# Patient Record
Sex: Female | Born: 1935 | Race: Black or African American | Hispanic: No | Smoking: Former smoker
Health system: Southern US, Community
[De-identification: ages and names within clinical notes are randomized; demographics above are authoritative.]

## PROBLEM LIST (undated history)

## (undated) DIAGNOSIS — M797 Fibromyalgia: Secondary | ICD-10-CM

## (undated) DIAGNOSIS — N182 Chronic kidney disease, stage 2 (mild): Secondary | ICD-10-CM

## (undated) DIAGNOSIS — I639 Cerebral infarction, unspecified: Secondary | ICD-10-CM

## (undated) DIAGNOSIS — I10 Essential (primary) hypertension: Secondary | ICD-10-CM

## (undated) DIAGNOSIS — R569 Unspecified convulsions: Secondary | ICD-10-CM

## (undated) DIAGNOSIS — G709 Myoneural disorder, unspecified: Secondary | ICD-10-CM

## (undated) DIAGNOSIS — M1990 Unspecified osteoarthritis, unspecified site: Secondary | ICD-10-CM

## (undated) DIAGNOSIS — F17200 Nicotine dependence, unspecified, uncomplicated: Secondary | ICD-10-CM

## (undated) DIAGNOSIS — R4182 Altered mental status, unspecified: Secondary | ICD-10-CM

## (undated) DIAGNOSIS — I2510 Atherosclerotic heart disease of native coronary artery without angina pectoris: Secondary | ICD-10-CM

## (undated) DIAGNOSIS — Z8719 Personal history of other diseases of the digestive system: Secondary | ICD-10-CM

## (undated) DIAGNOSIS — I6529 Occlusion and stenosis of unspecified carotid artery: Secondary | ICD-10-CM

## (undated) DIAGNOSIS — R55 Syncope and collapse: Secondary | ICD-10-CM

## (undated) DIAGNOSIS — R51 Headache: Secondary | ICD-10-CM

## (undated) DIAGNOSIS — R002 Palpitations: Secondary | ICD-10-CM

## (undated) DIAGNOSIS — E0590 Thyrotoxicosis, unspecified without thyrotoxic crisis or storm: Secondary | ICD-10-CM

## (undated) DIAGNOSIS — D649 Anemia, unspecified: Secondary | ICD-10-CM

## (undated) DIAGNOSIS — E119 Type 2 diabetes mellitus without complications: Secondary | ICD-10-CM

## (undated) DIAGNOSIS — J069 Acute upper respiratory infection, unspecified: Secondary | ICD-10-CM

## (undated) DIAGNOSIS — F419 Anxiety disorder, unspecified: Secondary | ICD-10-CM

## (undated) DIAGNOSIS — R011 Cardiac murmur, unspecified: Secondary | ICD-10-CM

## (undated) DIAGNOSIS — Z609 Problem related to social environment, unspecified: Secondary | ICD-10-CM

## (undated) DIAGNOSIS — E785 Hyperlipidemia, unspecified: Secondary | ICD-10-CM

## (undated) DIAGNOSIS — G252 Other specified forms of tremor: Secondary | ICD-10-CM

## (undated) DIAGNOSIS — I5032 Chronic diastolic (congestive) heart failure: Secondary | ICD-10-CM

## (undated) DIAGNOSIS — J449 Chronic obstructive pulmonary disease, unspecified: Secondary | ICD-10-CM

## (undated) DIAGNOSIS — E559 Vitamin D deficiency, unspecified: Secondary | ICD-10-CM

## (undated) DIAGNOSIS — J4489 Other specified chronic obstructive pulmonary disease: Secondary | ICD-10-CM

## (undated) HISTORY — DX: Other specified chronic obstructive pulmonary disease: J44.89

## (undated) HISTORY — DX: Chronic obstructive pulmonary disease, unspecified: J44.9

## (undated) HISTORY — DX: Altered mental status, unspecified: R41.82

## (undated) HISTORY — PX: KNEE SURGERY: SHX244

## (undated) HISTORY — DX: Type 2 diabetes mellitus without complications: E11.9

## (undated) HISTORY — DX: Cerebral infarction, unspecified: I63.9

## (undated) HISTORY — DX: Vitamin D deficiency, unspecified: E55.9

## (undated) HISTORY — DX: Other specified forms of tremor: G25.2

## (undated) HISTORY — DX: Anxiety disorder, unspecified: F41.9

## (undated) HISTORY — DX: Atherosclerotic heart disease of native coronary artery without angina pectoris: I25.10

## (undated) HISTORY — DX: Hyperlipidemia, unspecified: E78.5

## (undated) HISTORY — DX: Essential (primary) hypertension: I10

## (undated) HISTORY — DX: Syncope and collapse: R55

---

## 1963-12-24 HISTORY — PX: VAGINAL HYSTERECTOMY: SUR661

## 1963-12-24 HISTORY — PX: APPENDECTOMY: SHX54

## 1979-03-03 DIAGNOSIS — M1990 Unspecified osteoarthritis, unspecified site: Secondary | ICD-10-CM | POA: Insufficient documentation

## 1994-06-22 ENCOUNTER — Encounter (INDEPENDENT_AMBULATORY_CARE_PROVIDER_SITE_OTHER): Payer: Self-pay | Admitting: *Deleted

## 1994-06-22 LAB — CONVERTED CEMR LAB

## 1998-04-11 ENCOUNTER — Encounter: Admission: RE | Admit: 1998-04-11 | Discharge: 1998-04-11 | Payer: Self-pay | Admitting: Family Medicine

## 1998-04-21 ENCOUNTER — Encounter: Admission: RE | Admit: 1998-04-21 | Discharge: 1998-04-21 | Payer: Self-pay | Admitting: Sports Medicine

## 1998-04-25 ENCOUNTER — Encounter: Admission: RE | Admit: 1998-04-25 | Discharge: 1998-04-25 | Payer: Self-pay | Admitting: Family Medicine

## 1998-04-27 ENCOUNTER — Encounter: Admission: RE | Admit: 1998-04-27 | Discharge: 1998-04-27 | Payer: Self-pay | Admitting: Family Medicine

## 1998-08-17 ENCOUNTER — Ambulatory Visit (HOSPITAL_COMMUNITY): Admission: RE | Admit: 1998-08-17 | Discharge: 1998-08-17 | Payer: Self-pay | Admitting: Ophthalmology

## 1998-11-23 ENCOUNTER — Encounter: Admission: RE | Admit: 1998-11-23 | Discharge: 1998-11-23 | Payer: Self-pay | Admitting: Family Medicine

## 1999-07-13 ENCOUNTER — Encounter: Admission: RE | Admit: 1999-07-13 | Discharge: 1999-07-13 | Payer: Self-pay | Admitting: Family Medicine

## 1999-10-17 ENCOUNTER — Encounter: Admission: RE | Admit: 1999-10-17 | Discharge: 1999-10-17 | Payer: Self-pay | Admitting: Family Medicine

## 1999-12-31 ENCOUNTER — Encounter: Admission: RE | Admit: 1999-12-31 | Discharge: 1999-12-31 | Payer: Self-pay | Admitting: Family Medicine

## 2000-01-21 ENCOUNTER — Encounter: Admission: RE | Admit: 2000-01-21 | Discharge: 2000-01-21 | Payer: Self-pay | Admitting: Sports Medicine

## 2000-01-21 ENCOUNTER — Encounter: Payer: Self-pay | Admitting: Sports Medicine

## 2000-01-21 ENCOUNTER — Encounter: Admission: RE | Admit: 2000-01-21 | Discharge: 2000-01-21 | Payer: Self-pay | Admitting: Family Medicine

## 2000-01-28 ENCOUNTER — Encounter: Admission: RE | Admit: 2000-01-28 | Discharge: 2000-01-28 | Payer: Self-pay | Admitting: Sports Medicine

## 2000-01-28 ENCOUNTER — Encounter: Payer: Self-pay | Admitting: Sports Medicine

## 2000-05-07 ENCOUNTER — Encounter: Admission: RE | Admit: 2000-05-07 | Discharge: 2000-05-07 | Payer: Self-pay | Admitting: Family Medicine

## 2000-09-04 ENCOUNTER — Encounter (INDEPENDENT_AMBULATORY_CARE_PROVIDER_SITE_OTHER): Payer: Self-pay | Admitting: Specialist

## 2000-09-04 ENCOUNTER — Ambulatory Visit (HOSPITAL_COMMUNITY): Admission: RE | Admit: 2000-09-04 | Discharge: 2000-09-04 | Payer: Self-pay | Admitting: Gastroenterology

## 2001-01-09 ENCOUNTER — Encounter: Admission: RE | Admit: 2001-01-09 | Discharge: 2001-01-09 | Payer: Self-pay | Admitting: Family Medicine

## 2001-01-15 ENCOUNTER — Encounter: Admission: RE | Admit: 2001-01-15 | Discharge: 2001-01-15 | Payer: Self-pay | Admitting: Family Medicine

## 2001-01-22 ENCOUNTER — Encounter: Admission: RE | Admit: 2001-01-22 | Discharge: 2001-01-22 | Payer: Self-pay | Admitting: Family Medicine

## 2001-01-29 ENCOUNTER — Encounter: Admission: RE | Admit: 2001-01-29 | Discharge: 2001-01-29 | Payer: Self-pay | Admitting: *Deleted

## 2001-02-18 ENCOUNTER — Encounter: Admission: RE | Admit: 2001-02-18 | Discharge: 2001-02-18 | Payer: Self-pay | Admitting: Family Medicine

## 2001-03-19 ENCOUNTER — Encounter: Admission: RE | Admit: 2001-03-19 | Discharge: 2001-03-19 | Payer: Self-pay | Admitting: *Deleted

## 2001-09-24 ENCOUNTER — Encounter: Admission: RE | Admit: 2001-09-24 | Discharge: 2001-09-24 | Payer: Self-pay | Admitting: Family Medicine

## 2001-09-28 ENCOUNTER — Ambulatory Visit (HOSPITAL_COMMUNITY): Admission: RE | Admit: 2001-09-28 | Discharge: 2001-09-28 | Payer: Self-pay | Admitting: Family Medicine

## 2001-09-28 ENCOUNTER — Encounter: Payer: Self-pay | Admitting: Family Medicine

## 2001-09-28 ENCOUNTER — Encounter: Admission: RE | Admit: 2001-09-28 | Discharge: 2001-09-28 | Payer: Self-pay | Admitting: Family Medicine

## 2001-10-01 ENCOUNTER — Encounter: Admission: RE | Admit: 2001-10-01 | Discharge: 2001-10-01 | Payer: Self-pay | Admitting: Family Medicine

## 2001-10-15 ENCOUNTER — Encounter: Admission: RE | Admit: 2001-10-15 | Discharge: 2001-10-15 | Payer: Self-pay | Admitting: Family Medicine

## 2001-11-30 ENCOUNTER — Encounter: Admission: RE | Admit: 2001-11-30 | Discharge: 2001-11-30 | Payer: Self-pay | Admitting: Sports Medicine

## 2001-12-04 ENCOUNTER — Encounter: Admission: RE | Admit: 2001-12-04 | Discharge: 2001-12-04 | Payer: Self-pay | Admitting: Family Medicine

## 2001-12-31 ENCOUNTER — Encounter: Admission: RE | Admit: 2001-12-31 | Discharge: 2001-12-31 | Payer: Self-pay | Admitting: Family Medicine

## 2002-01-05 ENCOUNTER — Encounter: Admission: RE | Admit: 2002-01-05 | Discharge: 2002-01-05 | Payer: Self-pay | Admitting: Family Medicine

## 2002-01-05 ENCOUNTER — Encounter: Payer: Self-pay | Admitting: Sports Medicine

## 2002-01-05 ENCOUNTER — Encounter: Admission: RE | Admit: 2002-01-05 | Discharge: 2002-01-05 | Payer: Self-pay | Admitting: Sports Medicine

## 2002-01-19 ENCOUNTER — Encounter: Admission: RE | Admit: 2002-01-19 | Discharge: 2002-01-19 | Payer: Self-pay | Admitting: Family Medicine

## 2002-02-03 ENCOUNTER — Encounter: Admission: RE | Admit: 2002-02-03 | Discharge: 2002-02-03 | Payer: Self-pay | Admitting: Sports Medicine

## 2002-05-04 ENCOUNTER — Encounter: Admission: RE | Admit: 2002-05-04 | Discharge: 2002-05-04 | Payer: Self-pay | Admitting: Family Medicine

## 2002-05-26 ENCOUNTER — Encounter: Admission: RE | Admit: 2002-05-26 | Discharge: 2002-05-26 | Payer: Self-pay | Admitting: Family Medicine

## 2003-02-15 ENCOUNTER — Encounter: Admission: RE | Admit: 2003-02-15 | Discharge: 2003-02-15 | Payer: Self-pay | Admitting: Sports Medicine

## 2004-02-14 ENCOUNTER — Encounter: Admission: RE | Admit: 2004-02-14 | Discharge: 2004-02-14 | Payer: Self-pay | Admitting: Family Medicine

## 2004-03-01 ENCOUNTER — Encounter: Admission: RE | Admit: 2004-03-01 | Discharge: 2004-03-01 | Payer: Self-pay | Admitting: Sports Medicine

## 2004-05-23 ENCOUNTER — Encounter: Admission: RE | Admit: 2004-05-23 | Discharge: 2004-05-23 | Payer: Self-pay | Admitting: Family Medicine

## 2004-06-06 ENCOUNTER — Encounter: Admission: RE | Admit: 2004-06-06 | Discharge: 2004-06-06 | Payer: Self-pay | Admitting: Sports Medicine

## 2004-08-06 ENCOUNTER — Encounter: Admission: RE | Admit: 2004-08-06 | Discharge: 2004-08-06 | Payer: Self-pay | Admitting: Family Medicine

## 2004-08-28 ENCOUNTER — Ambulatory Visit: Payer: Self-pay | Admitting: Family Medicine

## 2004-09-12 ENCOUNTER — Ambulatory Visit: Payer: Self-pay | Admitting: Family Medicine

## 2004-09-22 ENCOUNTER — Emergency Department (HOSPITAL_COMMUNITY): Admission: EM | Admit: 2004-09-22 | Discharge: 2004-09-22 | Payer: Self-pay | Admitting: Emergency Medicine

## 2004-10-30 ENCOUNTER — Ambulatory Visit: Payer: Self-pay | Admitting: Family Medicine

## 2004-12-26 ENCOUNTER — Ambulatory Visit: Payer: Self-pay | Admitting: Family Medicine

## 2005-01-07 ENCOUNTER — Ambulatory Visit (HOSPITAL_COMMUNITY): Admission: RE | Admit: 2005-01-07 | Discharge: 2005-01-07 | Payer: Self-pay | Admitting: Family Medicine

## 2005-01-07 ENCOUNTER — Ambulatory Visit: Payer: Self-pay | Admitting: Family Medicine

## 2005-01-07 ENCOUNTER — Encounter: Admission: RE | Admit: 2005-01-07 | Discharge: 2005-01-07 | Payer: Self-pay | Admitting: Sports Medicine

## 2005-01-29 ENCOUNTER — Ambulatory Visit: Payer: Self-pay | Admitting: Family Medicine

## 2005-03-01 ENCOUNTER — Ambulatory Visit: Payer: Self-pay | Admitting: Family Medicine

## 2005-04-08 ENCOUNTER — Ambulatory Visit: Payer: Self-pay | Admitting: Family Medicine

## 2005-04-23 ENCOUNTER — Encounter: Admission: RE | Admit: 2005-04-23 | Discharge: 2005-04-23 | Payer: Self-pay | Admitting: Sports Medicine

## 2005-09-10 ENCOUNTER — Encounter: Admission: RE | Admit: 2005-09-10 | Discharge: 2005-09-10 | Payer: Self-pay | Admitting: Sports Medicine

## 2005-09-10 ENCOUNTER — Ambulatory Visit: Payer: Self-pay | Admitting: Sports Medicine

## 2005-09-19 ENCOUNTER — Ambulatory Visit: Payer: Self-pay | Admitting: Family Medicine

## 2005-09-24 ENCOUNTER — Encounter: Admission: RE | Admit: 2005-09-24 | Discharge: 2005-09-24 | Payer: Self-pay | Admitting: Sports Medicine

## 2005-12-11 ENCOUNTER — Ambulatory Visit: Payer: Self-pay | Admitting: Family Medicine

## 2006-01-27 ENCOUNTER — Ambulatory Visit: Payer: Self-pay | Admitting: Sports Medicine

## 2006-01-27 ENCOUNTER — Ambulatory Visit (HOSPITAL_COMMUNITY): Admission: RE | Admit: 2006-01-27 | Discharge: 2006-01-27 | Payer: Self-pay | Admitting: Sports Medicine

## 2006-01-29 ENCOUNTER — Encounter: Admission: RE | Admit: 2006-01-29 | Discharge: 2006-01-29 | Payer: Self-pay | Admitting: Sports Medicine

## 2006-02-04 ENCOUNTER — Encounter (INDEPENDENT_AMBULATORY_CARE_PROVIDER_SITE_OTHER): Payer: Self-pay | Admitting: Cardiology

## 2006-02-04 ENCOUNTER — Ambulatory Visit (HOSPITAL_COMMUNITY): Admission: RE | Admit: 2006-02-04 | Discharge: 2006-02-04 | Payer: Self-pay | Admitting: Sports Medicine

## 2006-02-14 ENCOUNTER — Ambulatory Visit: Payer: Self-pay | Admitting: Family Medicine

## 2006-02-21 ENCOUNTER — Ambulatory Visit: Payer: Self-pay | Admitting: Family Medicine

## 2006-03-21 ENCOUNTER — Ambulatory Visit: Payer: Self-pay | Admitting: Family Medicine

## 2006-10-29 ENCOUNTER — Ambulatory Visit: Payer: Self-pay | Admitting: Family Medicine

## 2006-11-19 ENCOUNTER — Ambulatory Visit: Payer: Self-pay | Admitting: Family Medicine

## 2007-01-29 ENCOUNTER — Encounter: Admission: RE | Admit: 2007-01-29 | Discharge: 2007-01-29 | Payer: Self-pay | Admitting: Sports Medicine

## 2007-01-29 ENCOUNTER — Ambulatory Visit: Payer: Self-pay

## 2007-02-10 ENCOUNTER — Encounter (INDEPENDENT_AMBULATORY_CARE_PROVIDER_SITE_OTHER): Payer: Self-pay | Admitting: Family Medicine

## 2007-02-10 ENCOUNTER — Ambulatory Visit: Payer: Self-pay | Admitting: Family Medicine

## 2007-02-10 LAB — CONVERTED CEMR LAB
TSH: 1.006 microintl units/mL (ref 0.350–5.50)
Vit D, 1,25-Dihydroxy: 9 — ABNORMAL LOW (ref 20–57)

## 2007-02-20 ENCOUNTER — Encounter (INDEPENDENT_AMBULATORY_CARE_PROVIDER_SITE_OTHER): Payer: Self-pay | Admitting: *Deleted

## 2007-03-03 ENCOUNTER — Encounter (INDEPENDENT_AMBULATORY_CARE_PROVIDER_SITE_OTHER): Payer: Self-pay | Admitting: Family Medicine

## 2007-03-03 ENCOUNTER — Ambulatory Visit: Payer: Self-pay | Admitting: Family Medicine

## 2007-03-03 DIAGNOSIS — G252 Other specified forms of tremor: Secondary | ICD-10-CM

## 2007-03-03 DIAGNOSIS — M12819 Other specific arthropathies, not elsewhere classified, unspecified shoulder: Secondary | ICD-10-CM | POA: Insufficient documentation

## 2007-03-03 DIAGNOSIS — E785 Hyperlipidemia, unspecified: Secondary | ICD-10-CM

## 2007-03-03 HISTORY — DX: Other specified forms of tremor: G25.2

## 2007-03-03 LAB — CONVERTED CEMR LAB
ALT: 15 units/L (ref 0–35)
AST: 17 units/L (ref 0–37)
Albumin: 4.3 g/dL (ref 3.5–5.2)
Alkaline Phosphatase: 56 units/L (ref 39–117)
BUN: 21 mg/dL (ref 6–23)
CO2: 20 meq/L (ref 19–32)
Calcium: 9.8 mg/dL (ref 8.4–10.5)
Chloride: 111 meq/L (ref 96–112)
Cholesterol: 242 mg/dL — ABNORMAL HIGH (ref 0–200)
Creatinine, Ser: 1.05 mg/dL (ref 0.40–1.20)
Glucose, Bld: 97 mg/dL (ref 70–99)
HDL: 58 mg/dL (ref >39)
LDL Cholesterol: 167 mg/dL — ABNORMAL HIGH (ref 0–99)
Potassium: 4.3 meq/L (ref 3.5–5.3)
Rhuematoid fact SerPl-aCnc: <20 intl units/mL (ref 0–20)
Sodium: 143 meq/L (ref 135–145)
Total Bilirubin: 0.3 mg/dL (ref 0.3–1.2)
Total CHOL/HDL Ratio: 4.2
Total Protein: 7.3 g/dL (ref 6.0–8.3)
Triglycerides: 84 mg/dL (ref <150)
VLDL: 17 mg/dL (ref 0–40)

## 2007-05-27 ENCOUNTER — Telehealth: Payer: Self-pay | Admitting: *Deleted

## 2007-07-16 ENCOUNTER — Ambulatory Visit: Payer: Self-pay | Admitting: Family Medicine

## 2007-07-16 DIAGNOSIS — I6529 Occlusion and stenosis of unspecified carotid artery: Secondary | ICD-10-CM

## 2007-07-16 DIAGNOSIS — I6789 Other cerebrovascular disease: Secondary | ICD-10-CM

## 2007-07-16 DIAGNOSIS — E559 Vitamin D deficiency, unspecified: Secondary | ICD-10-CM

## 2007-07-16 DIAGNOSIS — R7303 Prediabetes: Secondary | ICD-10-CM

## 2007-07-16 DIAGNOSIS — Z8672 Personal history of thrombophlebitis: Secondary | ICD-10-CM

## 2007-07-16 DIAGNOSIS — N3941 Urge incontinence: Secondary | ICD-10-CM

## 2007-07-16 HISTORY — DX: Vitamin D deficiency, unspecified: E55.9

## 2007-07-16 HISTORY — DX: Occlusion and stenosis of unspecified carotid artery: I65.29

## 2007-08-07 ENCOUNTER — Ambulatory Visit: Payer: Self-pay | Admitting: Family Medicine

## 2007-08-07 DIAGNOSIS — I10 Essential (primary) hypertension: Secondary | ICD-10-CM

## 2007-09-17 ENCOUNTER — Encounter: Payer: Self-pay | Admitting: *Deleted

## 2007-10-01 ENCOUNTER — Encounter: Admission: RE | Admit: 2007-10-01 | Discharge: 2007-10-01 | Payer: Self-pay | Admitting: Sports Medicine

## 2007-10-01 ENCOUNTER — Encounter (INDEPENDENT_AMBULATORY_CARE_PROVIDER_SITE_OTHER): Payer: Self-pay | Admitting: *Deleted

## 2007-10-01 DIAGNOSIS — M810 Age-related osteoporosis without current pathological fracture: Secondary | ICD-10-CM

## 2007-10-02 ENCOUNTER — Encounter (INDEPENDENT_AMBULATORY_CARE_PROVIDER_SITE_OTHER): Payer: Self-pay | Admitting: *Deleted

## 2007-10-13 ENCOUNTER — Encounter: Admission: RE | Admit: 2007-10-13 | Discharge: 2007-10-13 | Payer: Self-pay | Admitting: Sports Medicine

## 2007-10-22 ENCOUNTER — Ambulatory Visit: Payer: Self-pay | Admitting: Family Medicine

## 2007-10-27 ENCOUNTER — Telehealth (INDEPENDENT_AMBULATORY_CARE_PROVIDER_SITE_OTHER): Payer: Self-pay | Admitting: *Deleted

## 2007-11-30 ENCOUNTER — Encounter: Payer: Self-pay | Admitting: *Deleted

## 2008-07-25 ENCOUNTER — Ambulatory Visit: Payer: Self-pay | Admitting: Family Medicine

## 2008-07-25 ENCOUNTER — Encounter: Payer: Self-pay | Admitting: Family Medicine

## 2008-07-28 ENCOUNTER — Telehealth: Payer: Self-pay | Admitting: *Deleted

## 2008-08-12 ENCOUNTER — Telehealth: Payer: Self-pay | Admitting: *Deleted

## 2008-08-19 ENCOUNTER — Encounter: Payer: Self-pay | Admitting: Family Medicine

## 2008-08-22 ENCOUNTER — Encounter (INDEPENDENT_AMBULATORY_CARE_PROVIDER_SITE_OTHER): Payer: Self-pay | Admitting: *Deleted

## 2008-08-23 ENCOUNTER — Encounter: Payer: Self-pay | Admitting: *Deleted

## 2008-08-30 ENCOUNTER — Ambulatory Visit: Payer: Self-pay | Admitting: Family Medicine

## 2009-06-27 ENCOUNTER — Encounter: Payer: Self-pay | Admitting: Family Medicine

## 2009-06-27 ENCOUNTER — Ambulatory Visit: Payer: Self-pay | Admitting: Family Medicine

## 2009-06-27 DIAGNOSIS — R5383 Other fatigue: Secondary | ICD-10-CM

## 2009-06-27 DIAGNOSIS — R5381 Other malaise: Secondary | ICD-10-CM

## 2009-06-27 LAB — CONVERTED CEMR LAB
ALT: 10 units/L (ref 0–35)
AST: 13 units/L (ref 0–37)
Albumin: 4.3 g/dL (ref 3.5–5.2)
Alkaline Phosphatase: 69 units/L (ref 39–117)
BUN: 29 mg/dL — ABNORMAL HIGH (ref 6–23)
CO2: 24 meq/L (ref 19–32)
Calcium: 10.1 mg/dL (ref 8.4–10.5)
Chloride: 108 meq/L (ref 96–112)
Creatinine, Ser: 0.94 mg/dL (ref 0.40–1.20)
Direct LDL: 150 mg/dL — ABNORMAL HIGH
Glucose, Bld: 111 mg/dL — ABNORMAL HIGH (ref 70–99)
HCT: 39.8 % (ref 36.0–46.0)
Hemoglobin: 13.1 g/dL (ref 12.0–15.0)
MCHC: 32.9 g/dL (ref 30.0–36.0)
MCV: 85.8 fL (ref 78.0–100.0)
Platelets: 318 10*3/uL (ref 150–400)
Potassium: 5 meq/L (ref 3.5–5.3)
RBC: 4.64 M/uL (ref 3.87–5.11)
RDW: 16 % — ABNORMAL HIGH (ref 11.5–15.5)
Sodium: 142 meq/L (ref 135–145)
Total Bilirubin: 0.4 mg/dL (ref 0.3–1.2)
Total Protein: 7.5 g/dL (ref 6.0–8.3)
WBC: 6.3 10*3/uL (ref 4.0–10.5)

## 2009-07-20 ENCOUNTER — Encounter: Payer: Self-pay | Admitting: *Deleted

## 2009-07-20 ENCOUNTER — Ambulatory Visit: Payer: Self-pay | Admitting: Family Medicine

## 2009-07-20 DIAGNOSIS — R269 Unspecified abnormalities of gait and mobility: Secondary | ICD-10-CM

## 2009-07-26 ENCOUNTER — Telehealth: Payer: Self-pay | Admitting: Family Medicine

## 2009-08-02 ENCOUNTER — Telehealth (INDEPENDENT_AMBULATORY_CARE_PROVIDER_SITE_OTHER): Payer: Self-pay | Admitting: *Deleted

## 2009-12-25 ENCOUNTER — Encounter (INDEPENDENT_AMBULATORY_CARE_PROVIDER_SITE_OTHER): Payer: Self-pay | Admitting: *Deleted

## 2009-12-25 DIAGNOSIS — F17200 Nicotine dependence, unspecified, uncomplicated: Secondary | ICD-10-CM

## 2010-01-08 ENCOUNTER — Emergency Department (HOSPITAL_COMMUNITY): Admission: EM | Admit: 2010-01-08 | Discharge: 2010-01-08 | Payer: Self-pay | Admitting: Emergency Medicine

## 2010-02-08 ENCOUNTER — Ambulatory Visit (HOSPITAL_COMMUNITY): Admission: RE | Admit: 2010-02-08 | Discharge: 2010-02-08 | Payer: Self-pay | Admitting: Family Medicine

## 2010-02-08 ENCOUNTER — Ambulatory Visit: Payer: Self-pay | Admitting: Family Medicine

## 2010-02-08 DIAGNOSIS — R002 Palpitations: Secondary | ICD-10-CM | POA: Insufficient documentation

## 2010-02-15 ENCOUNTER — Encounter: Payer: Self-pay | Admitting: Family Medicine

## 2010-02-20 ENCOUNTER — Telehealth: Payer: Self-pay | Admitting: Family Medicine

## 2010-03-08 ENCOUNTER — Encounter: Payer: Self-pay | Admitting: Family Medicine

## 2010-03-08 ENCOUNTER — Ambulatory Visit: Payer: Self-pay | Admitting: Family Medicine

## 2010-03-08 LAB — CONVERTED CEMR LAB
Free T4: 1.1 ng/dL (ref 0.80–1.80)
TSH: 0.873 microintl units/mL (ref 0.350–4.500)

## 2010-03-09 ENCOUNTER — Encounter: Payer: Self-pay | Admitting: Family Medicine

## 2011-01-01 ENCOUNTER — Other Ambulatory Visit: Payer: Self-pay

## 2011-01-01 ENCOUNTER — Encounter: Payer: Self-pay | Admitting: Family Medicine

## 2011-01-01 ENCOUNTER — Ambulatory Visit (HOSPITAL_COMMUNITY)
Admission: RE | Admit: 2011-01-01 | Discharge: 2011-01-01 | Payer: Self-pay | Source: Home / Self Care | Admitting: Family Medicine

## 2011-01-01 ENCOUNTER — Ambulatory Visit: Admission: RE | Admit: 2011-01-01 | Discharge: 2011-01-01 | Payer: Self-pay | Source: Home / Self Care

## 2011-01-01 DIAGNOSIS — R4182 Altered mental status, unspecified: Secondary | ICD-10-CM | POA: Insufficient documentation

## 2011-01-01 HISTORY — DX: Altered mental status, unspecified: R41.82

## 2011-01-01 LAB — CONVERTED CEMR LAB
ALT: 12 units/L (ref 0–35)
AST: 16 units/L (ref 0–37)
Albumin: 4.6 g/dL (ref 3.5–5.2)
Alkaline Phosphatase: 64 units/L (ref 39–117)
BUN: 22 mg/dL (ref 6–23)
CO2: 25 meq/L (ref 19–32)
Calcium: 10.1 mg/dL (ref 8.4–10.5)
Chloride: 107 meq/L (ref 96–112)
Creatinine, Ser: 0.74 mg/dL (ref 0.40–1.20)
Glucose, Bld: 84 mg/dL (ref 70–99)
HCT: 37.5 % (ref 36.0–46.0)
Hemoglobin: 12.3 g/dL (ref 12.0–15.0)
MCHC: 32.8 g/dL (ref 30.0–36.0)
MCV: 87.2 fL (ref 78.0–100.0)
Platelets: 367 10*3/uL (ref 150–400)
Potassium: 4.5 meq/L (ref 3.5–5.3)
RBC: 4.3 M/uL (ref 3.87–5.11)
RDW: 16.2 % — ABNORMAL HIGH (ref 11.5–15.5)
Sodium: 143 meq/L (ref 135–145)
TSH: 1.118 microintl units/mL (ref 0.350–4.500)
Total Bilirubin: 0.4 mg/dL (ref 0.3–1.2)
Total Protein: 7.3 g/dL (ref 6.0–8.3)
WBC: 7.1 10*3/uL (ref 4.0–10.5)

## 2011-01-08 ENCOUNTER — Ambulatory Visit: Admission: RE | Admit: 2011-01-08 | Discharge: 2011-01-08 | Payer: Self-pay | Source: Home / Self Care

## 2011-01-08 DIAGNOSIS — R55 Syncope and collapse: Secondary | ICD-10-CM | POA: Insufficient documentation

## 2011-01-09 ENCOUNTER — Encounter: Payer: Self-pay | Admitting: *Deleted

## 2011-01-22 NOTE — Letter (Signed)
Summary: Results Follow-up Letter  [facility]  [address]   [place], [place] [address]   Phone: [phone]  Fax: [phone]    03/09/2010  [address] [place], [place]  [address]  Dear Ms. BOFFA,   The following are the results of your recent test(s):  Your thyroid labwork came back normal.  Please let us know if you have further questions.  Sincerely,  Delbert Harness MD [facility]           Appended Document: Results Follow-up Letter mailed.

## 2011-01-22 NOTE — Miscellaneous (Signed)
Summary: [name]  Clinical Lists Changes  Problems: Added new problem of [name] (ICD-305.1) 

## 2011-01-22 NOTE — Assessment & Plan Note (Signed)
Summary: f/u,df   Vital Signs:  Patient profile:   77 year old female Height:      60 inches Weight:      118.1 pounds Temp:     98.1 degrees F oral Pulse rate:   99 / minute BP sitting:   164 / 81  (left arm) Cuff size:   regular  Vitals Entered By: Gladstone Pih (February 08, 2010 2:15 PM) CC: F/U Is Patient Diabetic? No Pain Assessment Patient in pain? no        Primary Care Provider:  Delbert Harness MD  CC:  F/U.  History of Present Illness: 77 yo here to follow up:  1.  Essential Tremor:  States propranolol works a little bit but feels an OTC "Nerve Tonic" is more beneficial.  Wants to see what other medications are available.  Not currently symptomatic.  2. Palpitations:  Patient states she has a history of "palpitations" last happened in her 22's. Lasts 30 minutes, associated with SOB, anxiety.  No lightheadedness, chest pain.    3.  Urinary frequency: Described as urge incontinence.  Had used detrol in the past and worked well.  Would like to try again.  Has been taking cranberry tablet and juice without much help.  Wearing pads.  No dysuria.  Habits & Providers  Alcohol-Tobacco-Diet     Tobacco Status: never  Allergies: 1)  ! Penicillin 2)  ! Codeine PMH-FH-SH reviewed-no changes except otherwise noted  Social History: Smoked 1/2 ppd x >50 years (since age 64), still intermittently about 3 cigs/day, now says has quit since early 2009. Widowed in 1966.  Worked in hospital in the past; also attended nursing school in the past.  No ETOH.  Very colorful history of travel.  Concert level violinist/piano/organist.  Has no biological children and no legally adopted children.  Would like Brandon Melnick to be her healthcare advocate [phone], [phone].  States she is working on paperwork.  Unsure of code status at this time but in p ast has said she desire natural death.Smoking Status:  never  Review of Systems      See HPI General:  Complains of weakness. ENT:  Denies  decreased hearing. CV:  Complains of fatigue and palpitations; denies chest pain or discomfort, fainting, shortness of breath with exertion, and swelling of feet. Resp:  Denies cough, shortness of breath, and sputum productive. GI:  Denies abdominal pain and change in bowel habits. GU:  Complains of incontinence; denies dysuria and hematuria.  Physical Exam  General:  Alert, talkative, 77 year old female, heritage both [place] and [place].   Neck:  No JVD Lungs:  Normal respiratory effort, chest expands symmetrically. Lungs are clear to auscultation, no crackles or wheezes. Heart:  Normal rate and regular rhythm. No M/R/G Abdomen:  soft, non-tender, and normal bowel sounds.   Extremities:  no lower extremity edema Additional Exam:  EKG reviewed.  NSR.   PVC noted.   Impression & Recommendations:  Problem # 1:  PALPITATIONS (ICD-785.1) Not currently symptomatic on exam or by history today.  Discussed restarting due to mltiple benefits such as palpiations, blood pressure, as well as tremor.  Patient agreeable.  Will switch to once a day formulation.  Patient previously not taking.  Will check TSH and FRee T4 to rule out thyroid pathology.  Her updated medication list for this problem includes:    Inderal La 80 Mg Xr24h-cap (Propranolol hcl) .[name]... Take one tablet daily  Orders: EKG- [facility] (EKG) [facility]- Est  Level 4 ([address]214)Future Orders: TSH-[facility] ([id number]) ... 01/25/2011 Free T4-[facility] [phone]) ... 02/16/2011  Problem # 2:  FREQUENCY, URINARY (ICD-788.41)  Will start detrol.  Pro's/Cons discussed.  Will follow-up in 3 weeks to asses side effects/cognition.  Her updated medication list for this problem includes:    Detrol La 2 Mg Xr24h-cap (Tolterodine tartrate) .[name]... Take one tablet daily  Orders: [facility]- Est  Level 4 ([address]65)  Problem # 3:  HYPERTENSION, ESSENTIAL NOS (ICD-401.9)  restart propranol Her updated medication list for this problem includes:    Inderal La 80  Mg Xr24h-cap (Propranolol hcl) .[name]... Take one tablet daily  Orders: [facility]- Est  Level 4 ([address]214)  Problem # 4:  TREMOR, ESSENTIAL, HX OF (ICD-333.1)  Appears well controlled today.  Restart propranolol for blood pressure with benefits in ET as well.  Orders: [facility]- Est  Level 4 (78469)  Problem # 5:  ABNORMALITY OF GAIT (ICD-781.2)  Severe knee osteoarthritis.  Using cructhes.  Recently underwent physical therapy.  Does not desire surgery at this time.  Advised on decreasing Tylenol use.  Will discuss further at next visit.  Orders: [facility]- Est  Level 4 ([address]214)  Complete Medication List: 1)  Inderal La 80 Mg Xr24h-cap (Propranolol hcl) .... Take one tablet daily 2)  Calcium  .... Tid 3)  Vitamin D  .... Tid 4)  Tylenol Arthritis Pain 650 Mg Cr-tabs (Acetaminophen) .... Prn 5)  Detrol La 2 Mg Xr24h-cap (Tolterodine tartrate) .... Take one tablet daily  Patient Instructions: 1)  New once daily propranolol for tremor and palpitations and blood pressure. 2)  Resume Calcium and Vitamin D 3)  Will start bladder medicine detrol. Start a week after resuming propranolol 4)  Speak with Dawn about becoming healthcare power of attorney. 5)  Return in 3 weeks for follow-up. 6)  Only take Tylenol 650 mg one tablet 4 times a day. Prescriptions: DETROL LA 2 MG XR24H-CAP (TOLTERODINE TARTRATE) take one tablet daily  #30 x 2   Entered and Authorized by:   Delbert Harness MD   Signed by:   Delbert Harness MD on 02/08/2010   Method used:   Electronically to        [facility] [address] (retail)       [address]       [place], [place]  [address]       Ph: [phone]       Fax: [phone]   RxID:   [id number] INDERAL LA 80 MG XR24H-CAP (PROPRANOLOL HCL) take one tablet daily  #30 x 2   Entered and Authorized by:   Delbert Harness MD   Signed by:   Delbert Harness MD on 02/08/2010   Method used:   Electronically to        [facility] [address] (retail)       [address]       [place], [place]  [address]       Ph: [phone]       Fax: [phone]   RxID:   [id number]     Prevention & Chronic Care Immunizations   Influenza vaccine: Not documented    Tetanus booster: 05/23/2004: Done.   Tetanus booster due: 05/23/2014    Pneumococcal vaccine: Done.  (09/22/1994)   Pneumococcal vaccine due: None    H. zoster vaccine: Not documented  Colorectal Screening   Hemoccult: Done.  (  02/21/1995)   Hemoccult due: 02/21/1996    Colonoscopy: Done.  (06/22/1994)   Colonoscopy due: 06/22/2004  Other Screening   Pap smear: Done.  (06/22/1994)   Pap smear due: 06/23/1995    Mammogram: Probably normal Nodule.  F/up 6 wks per breast center  (10/12/2007)   Mammogram due: 10/11/2008    DXA bone density scan: abnormal (-3.x)  (10/01/2007)   DXA scan due: None    Smoking status: never  (02/08/2010)  Diabetes Mellitus   HgbA1C: Not documented    Eye exam: Not documented    Foot exam: Not documented   High risk foot: Not documented   Foot care education: Not documented    Urine microalbumin/creatinine ratio: Not documented  Lipids   Total Cholesterol: 242  (03/03/2007)   LDL: 167  (03/03/2007)   LDL Direct: 150  (06/27/2009)   HDL: 58  (03/03/2007)   Triglycerides: 84  (03/03/2007)    SGOT (AST): 13  (06/27/2009)   SGPT (ALT): 10  (06/27/2009)   Alkaline phosphatase: 69  (06/27/2009)   Total bilirubin: 0.4  (06/27/2009)  Hypertension   Last Blood Pressure: 164 / 81  (02/08/2010)   Serum creatinine: 0.94  (06/27/2009)   Serum potassium 5.0  (06/27/2009)    Hypertension flowsheet reviewed?: Yes   Progress toward BP goal: Deteriorated  Self-Management Support :   Personal Goals (by the next clinic visit) :      Personal blood pressure goal: 130/80  (02/08/2010)   Diabetes self-management support: Not documented    Hypertension self-management support: Not documented    Lipid self-management support:  Not documented

## 2011-01-22 NOTE — Assessment & Plan Note (Signed)
Summary: f/u eo   Vital Signs:  Patient profile:   77 year old female Weight:      120 pounds Temp:     99 degrees F Pulse rate:   105 / minute BP sitting:   161 / 96  (right arm) Cuff size:   regular  Vitals Entered By: [name] CMA (March 08, 2010 2:18 PM)  Primary Care Provider:  Delbert Harness MD   History of Present Illness: 77 yo here for follow-up:  Essential tremor:  has been taking propranolol daily- feels like it helps but would like better control of her tremors  Incontinenc, urge:  Tried detrol and she did not feel it helped any better than cranberry tablets.  Says insurance does not cover it anymore.  Did feel like it make her memory worse.  HYPERTENSION Meds: Taking and tolerating? has been taking propranolol daily Home BP's: checks about 3 times per week: says it has been "normal" and says she wrotes it down but cannot tell me what normal is. Chest Pain: no Dyspnea:no   Current Medications (verified): 1)  Inderal La 120 Mg Xr24h-Cap (Propranolol Hcl) .... Take Once A Day (New Dose) 2)  Calcium .... Tid 3)  Vitamin D .... Tid 4)  Tylenol Arthritis Pain 650 Mg Cr-Tabs (Acetaminophen) .... Prn  Allergies: 1)  ! Penicillin 2)  ! Codeine PMH-FH-SH reviewed-no changes except otherwise noted  Review of Systems      See HPI General:  Denies fever, weakness, and weight loss. CV:  Denies chest pain or discomfort, difficulty breathing while lying down, near fainting, and swelling of feet. Resp:  Denies cough and shortness of breath. GI:  Denies change in bowel habits. GU:  Complains of incontinence and urinary frequency; denies dysuria. MS:  Complains of joint pain.  Physical Exam  General:  Alert, talkative, 77 year old female Lungs:  Normal respiratory effort, chest expands symmetrically. Lungs are clear to auscultation, no crackles or wheezes. Heart:  Normal rate and regular rhythm. No M/R/G Msk:  bilateral knees with no obvious swelling or loss of  range of motion.  matient walks fairly steadily with cane and arm crutch Extremities:  no LE edema Neurologic:  No obvious resting or intention tremor at this time   Impression & Recommendations:  Problem # 1:  HYPERTENSION, ESSENTIAL NOS (ICD-401.9)  Patient willing to take blood pressure medicine because it helps her tremor which is her complaint.  Slightly tachycardic with hypertension- will tolerate increased dose- will increase today.  Her updated medication list for this problem includes:    Inderal La 120 Mg Xr24h-cap (Propranolol hcl) .[name]... Take once a day (new dose)  Orders: [facility]- Est  Level 4 (67893)  Problem # 2:  TREMOR, ESSENTIAL, HX OF (ICD-333.1)  Patient desires improved control of smptoms.  Will increase propranolol as #1.  Orders: [facility]- Est  Level 4 (81017)  Problem # 3:  SYMPTOM, INCONTINENCE, URINARY NOS (ICD-788.30)  No significant improvement with urge urinary incontinence with detrol.  Can not afford it and had side effect of cognitive impairment.  Discussed with patient anticholinergic side effects of most medicines.  Discussed possibility of using Vesicare with fewer side effects but not affordable for patient.  At this time, she prefers to continue with frequent meptying of bladder and depends which she find a tolerabel solution.  Orders: [facility]- Est  Level 4 (51025)  Problem # 4:  ARTHRITIS, KNEES, BILATERAL (ICD-716.98) filled out [facility] form for transportation.  No history  of  recent falls.  Patient reports good releif with tylenol arthritis.  WIllc ontinue current management.  Problem # 5:  Preventive Health Care (ICD-V70.0) Not checking lipids, vit d/ osteoporosis, HgA1c, secondary prevention of stroke at this time due to the fact that patient would prefer not to take any of the medications that she was on and prefers herbal supplements.    Complete Medication List: 1)  Inderal La 120 Mg Xr24h-cap (Propranolol hcl) .... Take once a day (new dose) 2)   Calcium  .... Tid 3)  Vitamin D  .... Tid 4)  Tylenol Arthritis Pain 650 Mg Cr-tabs (Acetaminophen) .... Prn  Patient Instructions: 1)  Please return in 3-4 weeks to see how propranolol is working for your blood pressure and your tremor. 2)  bring your blood pressure log to next appt. Prescriptions: INDERAL LA 120 MG XR24H-CAP (PROPRANOLOL HCL) take once a day (new dose)  #30 x 2   Entered and Authorized by:   Meredith Harness MD   Signed by:   Meredith Harness MD on 03/08/2010   Method used:   Electronically to        [facility] [address] (retail)       [address] [place], [place]  [address]       Ph: [phone]       Fax: [phone]   RxID:   [id number]      Prevention & Chronic Care Immunizations   Influenza vaccine: Not documented    Tetanus booster: 05/23/2004: Done.   Tetanus booster due: 05/23/2014    Pneumococcal vaccine: Done.  (09/22/1994)   Pneumococcal vaccine due: None    H. zoster vaccine: Not documented  Colorectal Screening   Hemoccult: Done.  (02/21/1995)   Hemoccult due: 02/21/1996    Colonoscopy: Done.  (06/22/1994)   Colonoscopy due: 06/22/2004  Other Screening   Pap smear: Done.  (06/22/1994)   Pap smear due: 06/23/1995    Mammogram: Probably normal Nodule.  F/up 6 wks per breast center  (10/12/2007)   Mammogram due: 10/11/2008    DXA bone density scan: abnormal (-3.x)  (10/01/2007)   DXA scan due: None    Smoking status: never  (02/08/2010)  Diabetes Mellitus   HgbA1C: Not documented    Eye exam: Not documented    Foot exam: Not documented   High risk foot: Not documented   Foot care education: Not documented    Urine microalbumin/creatinine ratio: Not documented  Lipids   Total Cholesterol: 242  (03/03/2007)   LDL: 167  (03/03/2007)   LDL Direct: 150  (06/27/2009)   HDL: 58  (03/03/2007)   Triglycerides: 84  (03/03/2007)    SGOT (AST): 13  (06/27/2009)   SGPT (ALT): 10  (06/27/2009)    Alkaline phosphatase: 69  (06/27/2009)   Total bilirubin: 0.4  (06/27/2009)  Hypertension   Last Blood Pressure: 161 / 96  (03/08/2010)   Serum creatinine: 0.94  (06/27/2009)   Serum potassium 5.0  (06/27/2009)    Hypertension flowsheet reviewed?: Yes   Progress toward BP goal: Unchanged  Self-Management Support :   Personal Goals (by the next clinic visit) :      Personal blood pressure goal: 130/80  (02/08/2010)   Patient will work on the following items until the next clinic visit to reach self-care goals:     Medications and monitoring: take my medicines every day, bring all  of my medications to every visit  (03/08/2010)    Diabetes self-management support: Not documented    Hypertension self-management support: Not documented    Lipid self-management support: Not documented     Appended Document: f/u eo information for [facility] sent to hilda [phone](ofc) [phone](fax)

## 2011-01-22 NOTE — Progress Notes (Signed)
Summary: phn msg  Phone Note Call from Patient Call back at Home Phone [phone]   Caller: Patient Summary of Call: has applied for [facility] transportation and they need to talk to doctor about this - pls call [phone] [facility] Initial call taken by: De Nurse,  February 20, 2010 1:42 PM  Follow-up for Phone Call        Correct phone number is [phone] to reach [facility].    Please call [employer] and le ther know Earley Abide said for her application she must complete and give [place] page 6 authorizing her medical records to be sent to [facility].  Then we can fax her medical records- last office note and geriatric assessment.  Thanks! Follow-up by: Delbert Harness MD,  February 20, 2010 1:58 PM  Additional Follow-up for Phone Call Additional follow up Details #1::        Pt notified and voiced understanding Additional Follow-up by: Gladstone Pih,  February 20, 2010 4:38 PM

## 2011-01-24 NOTE — Assessment & Plan Note (Signed)
Summary: irregular heartbeat/eo   Vital Signs:  Patient profile:   77 year old female Height:      60 inches Weight:      115.4 pounds BMI:     22.62 Temp:     97.9 degrees F oral Pulse rate:   98 / minute BP sitting:   173 / 81  (left arm) Cuff size:   regular  Vitals Entered By: Jimmy Footman, CMA (January 01, 2011 8:40 AM) CC: IRREGULAR HEARTBEAT W/ DIZZINESS Is Patient Diabetic? Yes Did you bring your meter with you today? No   Primary Care Provider:  Delbert Harness MD  CC:  IRREGULAR HEARTBEAT W/ DIZZINESS.  History of Present Illness: 77 yo here for follow-up  palpitations: Worsening for the past 3-4 months.   Patient has history of PVC's and was evaluated by cardiology in 2010.  They offered her holter monitoring and she declined.  She feels like the palpitations are getting worse- episodes lasts up up to 20-25 minutes.   Occurs off and on throughout the day.  No chest pain.   Associated now with dyspnea, diaphoresis, weakness.  Usually occurs at rest.    Also notes episodes of "blacking out"  A friend noticed her "not aware of her surroundings" with eyes open.  No falls.  Sometimes when she is watchign TV, she realizes she had "blacked out" for 15-20 minutes.  Now occuring more frequently.  No seizure activity.  Notes some dizziness when standing up.  Not associated with episodes of palpitations.  Habits & Providers  Alcohol-Tobacco-Diet     Tobacco Status: never     Tobacco Counseling: to quit use of tobacco products     Year Quit: 2009  Allergies: 1)  ! Penicillin 2)  ! Codeine  Review of Systems      See HPI  Physical Exam  General:  Alert, talkative, 77 year old female.  Vitals rechecked, no orthostasis. Head:  Normocephalic and atraumatic without obvious abnormalities. No apparent alopecia or balding. Eyes:  No corneal or conjunctival inflammation noted. EOMI. Perrla. Neck:  supple, no masses Lungs:  Normal respiratory effort, chest expands symmetrically.  Lungs are clear to auscultation, no crackles or wheezes. Heart:  Normal rate and regular rhythm. No M/R/G Abdomen:  soft, non-tender, and normal bowel sounds.   Neurologic:  alert & oriented X3, cranial nerves II-XII intact, strength normal in all extremities, sensation intact to light touch, DTRs symmetrical and normal, and finger-to-nose normal.    Overall unsteady gait, ambulates with cane. Additional Exam:  EKG;  NSR, No significant PVC's    Impression & Recommendations:  Problem # 1:  PALPITATIONS (ICD-785.1) Known PVC's.  Encouraged reastarting inderall.  Will get 24 hour holter.  Her updated medication list for this problem includes:    Inderal La 120 Mg Xr24h-cap (Propranolol hcl) .[name]... Take once a day (new dose)  Orders: Comp Met-[facility] ([id number]) CBC-[facility] ([address]) TSH-[facility] ([id number]) EKG- [facility] (EKG) [facility]- Est  Level 4 (13086)  Problem # 2:  ALTERED MENTAL STATUS (ICD-780.97)  Periods of absence she does not remember-staring spells.  Appears to be staring based on a single observed event.  No seziure activity.  ? absence seizures vs TIA.  24 hour Holter will hopefully be able to giev some information.  no dizziness or lightheadedness, not syncopal by history.   If  no arrythmia on Holter, would consider MRI/MRA head/neck to evaluate for stroke and vascular supply.  She does not drive, no dangerous activities.  Orders:  [facility]- Est  Level 4 ([address]214)  Problem # 3:  HYPERTENSION, ESSENTIAL NOS (ICD-401.9)  She declines treatment although states she may take her propranolol if it will help with palpatations  Her updated medication list for this problem includes:    Inderal La 120 Mg Xr24h-cap (Propranolol hcl) .[name]... Take once a day (new dose)  Orders: [facility]- Est  Level 4 (99214)  Problem # 4:  HYPERLIPIDEMIA (ICD-272.4)  declines treatment.  Will not monitor.  Orders: [facility]- Est  Level 4 (99214)  Complete Medication List: 1)  Inderal La 120 Mg Xr24h-cap (Propranolol hcl)  .... Take once a day (new dose) 2)  Calcium  .... Tid 3)  Vitamin D  .... Tid 4)  Tylenol Arthritis Pain 650 Mg Cr-tabs (Acetaminophen) .... Prn  Patient Instructions: 1)  Please restart your inderal 2)  Will check a holter monitor to make see what yoru ehart rhythm is 3)  Follow-up in 1 week   Orders Added: 1)  Comp Met-[facility] [[id number]] 2)  CBC-[facility] [85027] 3)  TSH-[facility] [[id number]] 4)  EKG- [facility] [EKG] 5)  [facility]- Est  Level 4 [84132]

## 2011-01-24 NOTE — Miscellaneous (Signed)
Summary: holter monitor  Clinical Lists Changes holter monitor was returned  yesterday afternoon  and was taken to hospital  EKG department today. Theresia Lo RN  January 09, 2011 5:13 PM

## 2011-01-24 NOTE — Assessment & Plan Note (Signed)
Summary: f/u eo   Vital Signs:  Patient profile:   77 year old female Height:      60 inches Weight:      114.8 pounds BMI:     22.50 Temp:     97.8 degrees F Pulse rate:   80 / minute BP sitting:   128 / 70  (right arm) Cuff size:   regular CC: Follow-up visit/Irreg HR and Syncope Is Patient Diabetic? No Pain Assessment Patient in pain? no        Primary Care Provider:  Delbert Harness MD  CC:  Follow-up visit/Irreg HR and Syncope.  History of Present Illness: 77 yo here for follow-up of palpitations and periods of abscence.  Holter monitor was placed at last visit.  She never returned to have it removed.  She states she pressed the button for a period of palpitations during the first 24 hours.  Only 2 episodes of palpitation in the last week.  Contiinues to have periods of "blacking out" as described in last note.   Thiese episodes have not changed in character.  Described as sitting to watch a tv program, and then next thign she remembers is the tv program is over.  No falls.  Never took the propranolol.  HR and BP improved today.  Habits & Providers  Alcohol-Tobacco-Diet     Tobacco Status: never     Tobacco Counseling: to quit use of tobacco products     Year Quit: 2009  Current Medications (verified): 1)  Inderal La 120 Mg Xr24h-Cap (Propranolol Hcl) .... Take Once A Day (New Dose) 2)  Calcium .... Tid 3)  Vitamin D .... Tid 4)  Tylenol Arthritis Pain 650 Mg Cr-Tabs (Acetaminophen) .... Prn  Allergies: 1)  ! Penicillin 2)  ! Codeine PMH-FH-SH reviewed for relevance  Review of Systems      See HPI  Physical Exam  General:  Well-developed,well-nourished,in no acute distress; alert,appropriate and cooperative throughout examination.   Lungs:  Normal respiratory effort, chest expands symmetrically. Lungs are clear to auscultation, no crackles or wheezes. Heart:  Normal rate and regular rhythm. No M/R/G   Impression & Recommendations:  Problem # 1:   PALPITATIONS (ICD-785.1)  Will remove Holter monitor today and assess burden of PVC's.  Encouraged patient to star taking propranolol again and fill prescription.  Her updated medication list for this problem includes:    Inderal La 120 Mg Xr24h-cap (Propranolol hcl) .[name]... Take once a day (new dose)  Orders: [facility]- Est Level  3 (04540)  Problem # 2:  ALTERED MENTAL STATUS (ICD-780.97)  discussed approach to diagnosis either to  include continues monitoring vs workup with MRI/MRA, EEG, etc.  Possible patient is just somnolent as cause of these episodes and notes late bedtimes.  No dangerous events and only occurs when sitting quietly for extended periods of time such as church or watching tv.  Patient would like to keep log of events, and will bring to next visit.  She will work on regular bedtimes and adequate sleep.  Orders: [facility]- Est Level  3 (98119)  Complete Medication List: 1)  Inderal La 120 Mg Xr24h-cap (Propranolol hcl) .... Take once a day (new dose) 2)  Calcium  .... Tid 3)  Vitamin D  .... Tid 4)  Tylenol Arthritis Pain 650 Mg Cr-tabs (Acetaminophen) .... Prn  [id number]  Patient Instructions: 1)  Propranolol will help with yoru tremors and palpitations 2)  Keep a record of your "blacking  out events" 3)  make follow-up appointment to discuss what you've found 4)  Keep regular sleep schedules   Orders Added: 1)  Holter Monitor- [facility] [93224] 2)  [facility]- Est Level  3 [30865]

## 2011-05-10 NOTE — Procedures (Signed)
[facility]  Patient:    [name], [name]                   MRN: [id number] Proc. Date: 09/04/00 Adm. Date:  [id number] Attending:  Orland Mustard CC:         Talmage Nap, M.D.   Procedure Report  PROCEDURE:  Colonoscopy and coagulation of polyps.  SURGEON:  James L. Edwards, M.D.  MEDICATIONS:  Fentanyl 50 mcg and Versed 6 mg IV.  SCOPE:  Adult Olympus video colonoscope.  INDICATIONS:  Previous history of polyps.  DESCRIPTION OF PROCEDURE:  The procedure was explained to the patient and consent obtained.  With the patient in the left lateral decubitus position, the Olympus adult video colonoscope was inserted and advanced under direct visualization.  The prep was somewhat marginal.  The cecum was particular dirty red.  We reached the cecum and the right lower quadrant transilluminated.  The scope was withdrawn.  The cecum, ascending colon, hepatic flexure, and transverse colon were seen well.  In the descending colon, there were two polyps, each 3-4 mm that were cauterized and placed in a single jar.  The sigmoid colon was free of polyps and really free of any significant diverticuli.  The scope was withdrawn.  The patient tolerated the procedure well and was maintained on low flow oxygen and pulse oximeter throughout the procedure with no obvious problem.  ASSESSMENT:  Descending colon polyps removed.  PLAN:  Will recommend repeating in three years. DD:  09/04/00 TD:  09/05/00 Job: [id number]

## 2011-11-28 ENCOUNTER — Ambulatory Visit (INDEPENDENT_AMBULATORY_CARE_PROVIDER_SITE_OTHER): Payer: Medicare Other | Admitting: Family Medicine

## 2011-11-28 ENCOUNTER — Telehealth: Payer: Self-pay | Admitting: Clinical

## 2011-11-28 ENCOUNTER — Encounter: Payer: Self-pay | Admitting: Family Medicine

## 2011-11-28 VITALS — BP 132/78 | HR 88 | Temp 99.1°F | Ht 60.0 in | Wt 109.0 lb

## 2011-11-28 DIAGNOSIS — Z609 Problem related to social environment, unspecified: Secondary | ICD-10-CM

## 2011-11-28 DIAGNOSIS — G250 Essential tremor: Secondary | ICD-10-CM

## 2011-11-28 DIAGNOSIS — N3941 Urge incontinence: Secondary | ICD-10-CM

## 2011-11-28 DIAGNOSIS — Z7289 Other problems related to lifestyle: Secondary | ICD-10-CM

## 2011-11-28 DIAGNOSIS — J40 Bronchitis, not specified as acute or chronic: Secondary | ICD-10-CM

## 2011-11-28 DIAGNOSIS — E119 Type 2 diabetes mellitus without complications: Secondary | ICD-10-CM

## 2011-11-28 HISTORY — DX: Problem related to social environment, unspecified: Z60.9

## 2011-11-28 LAB — CBC WITH DIFFERENTIAL/PLATELET
Basophils Absolute: 0 10*3/uL (ref 0.0–0.1)
Basophils Relative: 0 % (ref 0–1)
Eosinophils Absolute: 0 10*3/uL (ref 0.0–0.7)
Eosinophils Relative: 0 % (ref 0–5)
HCT: 38.4 % (ref 36.0–46.0)
Hemoglobin: 12.8 g/dL (ref 12.0–15.0)
Lymphocytes Relative: 17 % (ref 12–46)
Lymphs Abs: 1.3 10*3/uL (ref 0.7–4.0)
MCH: 29.2 pg (ref 26.0–34.0)
MCHC: 33.3 g/dL (ref 30.0–36.0)
MCV: 87.7 fL (ref 78.0–100.0)
Monocytes Absolute: 0.6 10*3/uL (ref 0.1–1.0)
Monocytes Relative: 7 % (ref 3–12)
Neutro Abs: 6.1 10*3/uL (ref 1.7–7.7)
Neutrophils Relative %: 76 % (ref 43–77)
Platelets: 390 10*3/uL (ref 150–400)
RBC: 4.38 MIL/uL (ref 3.87–5.11)
RDW: 15.7 % — ABNORMAL HIGH (ref 11.5–15.5)
WBC: 8 10*3/uL (ref 4.0–10.5)

## 2011-11-28 MED ORDER — PREDNISONE 50 MG PO TABS: ORAL_TABLET | ORAL | Status: DC

## 2011-11-28 MED ORDER — SPACER/AERO-HOLDING CHAMBERS DEVI: 1.0000 | Freq: Every day | Status: DC

## 2011-11-28 MED ORDER — ALBUTEROL SULFATE (2.5 MG/3ML) 0.083% IN NEBU: 2.5000 mg | INHALATION_SOLUTION | Freq: Four times a day (QID) | RESPIRATORY_TRACT | Status: DC | PRN

## 2011-11-28 MED ORDER — PRIMIDONE 50 MG PO TABS: 50.0000 mg | ORAL_TABLET | Freq: Every day | ORAL | Status: DC

## 2011-11-28 MED ORDER — PREDNISONE 50 MG PO TABS: ORAL_TABLET | ORAL | Status: AC

## 2011-11-28 NOTE — Assessment & Plan Note (Signed)
Unclear if all her symptoms are from an acute viral illness or if her chronic disease is worsening. Will start Prednisone 50mg  for 10 days as well as Albuterol inhaler. Patient will return to clinic in 2 weeks for a follow-up appointment. At that time, hopefully she will be over her viral syndrome and we can better evaluate her pulmonary function. She may possibly need PFT in pharmacy clinic with the consideration of starting a long term inhaled steroid. Patient is encouraged to quit smoking. She states she has cut back and would like to quit. Given the length and complexity of today's appointment, smoking cessation was not a focal point, but it will be at future appointments.

## 2011-11-28 NOTE — Telephone Encounter (Signed)
Clinical Child psychotherapist (CSW) contacted [facility] to inquire about obtaining a power wheelchair for pt. [facility] stated they would have to evaluate pt need for a wheel chair in and outside of the home and inability to transport with cane/regular wheel chair. Representative stated they would contact pt and PCP to arrange a time to assess. Apparently appointment would be held at pt PCP clinic. CSW will follow up with updates. Theresia Bough, MSW, Theresia Majors [phone]

## 2011-11-28 NOTE — Assessment & Plan Note (Signed)
Patient states tremor is interfering with everyday life. Makes it difficult to eat (sometimes takes up to 20 mins to eat). Will try Primidone 50mg  qhs to help with tremor. (She states she will be able to get her medications at [facility] drug.) Patient to return to clinic in 2 weeks for follow-up.

## 2011-11-28 NOTE — Assessment & Plan Note (Signed)
Patient has many high risk social situations given her age, support network and medical conditions. Theresia Bough CSW spoke with patient during her visit today. She will contact the patient's church as well as look at other community resources. She also gave patient a free cab voucher to get home today. We really appreciate [name]'s assistance with this patient, and I feel like CSW involvement will improve her quality of life.

## 2011-11-28 NOTE — Patient Instructions (Addendum)
It was nice to see you today!  Please stop by the lab to get your blood drawn.  Please pick up your medications at [facility] drug. You will have a prednisone prescription as well as an inhaler with a chamber to use. You may take Robitussin DM over the counter for your cough.  Our social worker, Theresia Bough, will call you back when she finds resources for you in the community. Also, please ask the ladies up front to call the cab for you to get home today.  Please come back to see me in 2 weeks for a follow-up appointment.  Colbi Schiltz M. Ismahan Lippman, M.D.

## 2011-11-28 NOTE — Telephone Encounter (Signed)
Clinical Child psychotherapist (CSW) received a referral to assess pt with securing community resources including transportation as pt is an 77 year old female that takes the bus/walks everywhere. CSW explored pt insurance, family and community support and pt stated she does not have any family in the area however has support from her church. Pt also stated that she has a home and unfortunately cannot sale the home "because of the economy," and her neighbor being unable to sale their home for a few years.  CSW informed pt that she would follow up with resources in the community and inform pt. CSW provided pt with a taxi voucher to get home.  CSW contacted pt church who stated they do not have a designated person to provide transportation however they have and can assist pt with needs. CSW also left a vm for senior resources and CSW contacted [facility] to see if a pt can receive services from a case worker. Pt information will be passed on to [employer]. Theresia Bough, MSW, Theresia Majors [phone]

## 2011-11-28 NOTE — Assessment & Plan Note (Signed)
Not currently on any medications. CBG 110 today. Continue to monitor.

## 2011-11-28 NOTE — Assessment & Plan Note (Signed)
Long term history. Does not appear to be true urge incontinence but more likely multifactorial. Currently, I feel her concern is mostly stress incontinence given her cough. Will continue to monitor. Possibly start medication in the future once she has recovered from her current acute respiratory illness.

## 2011-11-28 NOTE — Progress Notes (Signed)
Subjective:    Patient ID: [name], female    DOB: [birth year], 77 y.o.   MRN: [id number]  HPI Patient is a 77 yo female presenting to clinic today as a walk-in to be seen for questionable bronchitis, as well as other issues listed below  1. Bronchitis- Patient states she has had bronchitis for many years. Over the last 2-3 weeks she has had increased cough. She states her cough comes on as "coughing fits". She has tried OTC cough medicine with little relief. No fevers. Patient is a current every day smoker, but this cough has made her cut back. She wants to quit smoking. She does not use inhalers. Does have productive cough. 2. Incontinence- Patient states she has always had "bladder problems" since she was a child. She has been previously diagnosed with urge incontinence. She states she is up all night going to the bathroom, but it has been worse in the last 6-7 months. She usually wears 2 pads when she goes out in public in case she is not near a restroom, but when she is at home she can make it to the [employer]. 3. Shaking hands- Patient is concerned about her hands shaking. She states she has had tremors for 10 years, but it is really starting to bother her now. Her tremor is worse with intent and therefore it makes it very difficult for her to feed herself or pick up objects. Her previous medication list had Propranolol as a home medication, but patient states she has not taken that medication in a long time.  4. Social issues- Patient lives alone with no support. She owns a home and is able to pay her mortgage every month with approx $400 to spare for other bills and groceries. She does not have any family in the area. She only has contact with one nephew. She does not have anyone she can trust to run errands for her. She walks with a cane and a crutch, but has 2 wheelchairs that she is not able to use due to weakness of her arms. She has also had a 5lb weight loss since January. She states  she realizes she is getting smaller when she gets dressed.   Patient is not taking any prescription medications. She does take OTC vitamins. Her diet consists of mostly vegetables. She states she is able to cook for herself, bathe herself (using chair in shower) and keep her house with no difficulties. She is interested in talking to our Child psychotherapist today.  She does not take the flu shot (states it gives her "pneumonia").   Review of Systems  Constitutional: Positive for activity change and appetite change. Negative for fever and chills.  HENT: Negative for congestion.   Respiratory: Positive for cough. Negative for shortness of breath.   Cardiovascular: Positive for palpitations. Negative for chest pain.  Gastrointestinal: Negative for abdominal pain.  Genitourinary: Positive for urgency and frequency. Negative for difficulty urinating.  Musculoskeletal: Positive for arthralgias and gait problem.  Skin: Negative for rash.       Objective:   Physical Exam  Constitutional: She is oriented to person, place, and time. Vital signs are normal. She appears well-developed. She does not have a sickly appearance. No distress.       Thin appearing  HENT:  Head: Normocephalic and atraumatic.  Eyes: Pupils are equal, round, and reactive to light.  Cardiovascular: Normal rate, regular rhythm and normal heart sounds.   No murmur heard. Pulmonary/Chest: Effort normal. No  accessory muscle usage. Not tachypneic. No respiratory distress. She has wheezes. She has rales.       Diffuse coarse breath sounds  Abdominal: Soft. She exhibits no distension.  Musculoskeletal:       Sitting in wheelchair. Able to stand without assistance. Uses crutch and cane to ambulate.   Lymphadenopathy:    She has no cervical adenopathy.  Neurological: She is alert and oriented to person, place, and time. No cranial nerve deficit.  Skin: Skin is warm and dry. No rash noted. No erythema.       Thin, dry skin. Multiple  nevi on trunk and arms.          Assessment & Plan:  I interviewed and examined this patient and discussed the care plan with Dr. Mikel Cella and agree with assessment and plan.   Wayne A. Sheffield Slider, MD Family Medicine Teaching Service Attending  11/28/2011 2:03 PM

## 2011-12-05 ENCOUNTER — Ambulatory Visit: Payer: Self-pay

## 2011-12-05 ENCOUNTER — Telehealth: Payer: Self-pay | Admitting: Clinical

## 2011-12-05 NOTE — Telephone Encounter (Signed)
Clinical Child psychotherapist (CSW) spoke with [facility] and was informed that they will contact pt and MD to schedule a "Face to Face" appointment at PCP office to evaluate pt need for a power wheel chair. CSW was informed that in this visit they will need to be able to rule out pt ability to use a regular wheel chair, cane and walker. CSW contacted pt to inform her that a representative from [facility] will be contacting her soon. Pt was appreciative of assistance.   Theresia Bough, MSW, Theresia Majors [phone]

## 2011-12-13 ENCOUNTER — Ambulatory Visit: Payer: Medicare Other | Admitting: Family Medicine

## 2011-12-28 ENCOUNTER — Emergency Department (HOSPITAL_COMMUNITY): Payer: Medicare Other

## 2011-12-28 ENCOUNTER — Encounter (HOSPITAL_COMMUNITY): Payer: Self-pay | Admitting: *Deleted

## 2011-12-28 ENCOUNTER — Other Ambulatory Visit: Payer: Self-pay

## 2011-12-28 ENCOUNTER — Inpatient Hospital Stay (HOSPITAL_COMMUNITY)
Admission: EM | Admit: 2011-12-28 | Discharge: 2011-12-31 | DRG: 313 | Disposition: A | Discharge status: Home Health | Payer: Medicare Other | Attending: Family Medicine | Admitting: Family Medicine

## 2011-12-28 DIAGNOSIS — J4489 Other specified chronic obstructive pulmonary disease: Secondary | ICD-10-CM | POA: Diagnosis present

## 2011-12-28 DIAGNOSIS — G250 Essential tremor: Secondary | ICD-10-CM | POA: Diagnosis present

## 2011-12-28 DIAGNOSIS — R079 Chest pain, unspecified: Principal | ICD-10-CM | POA: Diagnosis present

## 2011-12-28 DIAGNOSIS — K219 Gastro-esophageal reflux disease without esophagitis: Secondary | ICD-10-CM | POA: Diagnosis present

## 2011-12-28 DIAGNOSIS — I252 Old myocardial infarction: Secondary | ICD-10-CM

## 2011-12-28 DIAGNOSIS — I10 Essential (primary) hypertension: Secondary | ICD-10-CM | POA: Diagnosis present

## 2011-12-28 DIAGNOSIS — E785 Hyperlipidemia, unspecified: Secondary | ICD-10-CM | POA: Diagnosis present

## 2011-12-28 DIAGNOSIS — I2510 Atherosclerotic heart disease of native coronary artery without angina pectoris: Secondary | ICD-10-CM | POA: Diagnosis present

## 2011-12-28 DIAGNOSIS — R002 Palpitations: Secondary | ICD-10-CM

## 2011-12-28 DIAGNOSIS — I6529 Occlusion and stenosis of unspecified carotid artery: Secondary | ICD-10-CM | POA: Diagnosis present

## 2011-12-28 DIAGNOSIS — Z8673 Personal history of transient ischemic attack (TIA), and cerebral infarction without residual deficits: Secondary | ICD-10-CM

## 2011-12-28 DIAGNOSIS — I5032 Chronic diastolic (congestive) heart failure: Secondary | ICD-10-CM | POA: Diagnosis present

## 2011-12-28 DIAGNOSIS — F17200 Nicotine dependence, unspecified, uncomplicated: Secondary | ICD-10-CM | POA: Diagnosis present

## 2011-12-28 DIAGNOSIS — G252 Other specified forms of tremor: Secondary | ICD-10-CM | POA: Diagnosis present

## 2011-12-28 DIAGNOSIS — E119 Type 2 diabetes mellitus without complications: Secondary | ICD-10-CM | POA: Diagnosis present

## 2011-12-28 DIAGNOSIS — I509 Heart failure, unspecified: Secondary | ICD-10-CM | POA: Diagnosis present

## 2011-12-28 DIAGNOSIS — J449 Chronic obstructive pulmonary disease, unspecified: Secondary | ICD-10-CM | POA: Diagnosis present

## 2011-12-28 DIAGNOSIS — R7303 Prediabetes: Secondary | ICD-10-CM | POA: Diagnosis present

## 2011-12-28 HISTORY — DX: Nicotine dependence, unspecified, uncomplicated: F17.200

## 2011-12-28 HISTORY — DX: Palpitations: R00.2

## 2011-12-28 HISTORY — DX: Occlusion and stenosis of unspecified carotid artery: I65.29

## 2011-12-28 HISTORY — DX: Problem related to social environment, unspecified: Z60.9

## 2011-12-28 LAB — DIFFERENTIAL
Eosinophils Absolute: 0 10*3/uL (ref 0.0–0.7)
Eosinophils Relative: 0 % (ref 0–5)
Lymphs Abs: 0.7 10*3/uL (ref 0.7–4.0)
Monocytes Absolute: 0.7 10*3/uL (ref 0.1–1.0)
Monocytes Relative: 10 % (ref 3–12)

## 2011-12-28 LAB — CBC
Hemoglobin: 12.1 g/dL (ref 12.0–15.0)
MCH: 29.6 pg (ref 26.0–34.0)
MCV: 86.8 fL (ref 78.0–100.0)
Platelets: 302 10*3/uL (ref 150–400)
RBC: 4.09 MIL/uL (ref 3.87–5.11)

## 2011-12-28 LAB — BASIC METABOLIC PANEL
BUN: 21 mg/dL (ref 6–23)
Calcium: 9.4 mg/dL (ref 8.4–10.5)
Creatinine, Ser: 0.7 mg/dL (ref 0.50–1.10)
GFR calc non Af Amer: 75 mL/min — ABNORMAL LOW (ref >90)
Glucose, Bld: 102 mg/dL — ABNORMAL HIGH (ref 70–99)

## 2011-12-28 LAB — PRO B NATRIURETIC PEPTIDE: Pro B Natriuretic peptide (BNP): 547.8 pg/mL — ABNORMAL HIGH (ref 0–450)

## 2011-12-28 LAB — D-DIMER, QUANTITATIVE: D-Dimer, Quant: 1.96 ug/mL-FEU — ABNORMAL HIGH (ref 0.00–0.48)

## 2011-12-28 MED ORDER — FUROSEMIDE 10 MG/ML IJ SOLN
40.0000 mg | Freq: Once | INTRAMUSCULAR | Status: AC
  Administered 2011-12-28: 40 mg via INTRAVENOUS
  Filled 2011-12-28: qty 4

## 2011-12-28 MED ORDER — ASPIRIN 81 MG PO CHEW
324.0000 mg | CHEWABLE_TABLET | Freq: Once | ORAL | Status: AC
  Administered 2011-12-28: 324 mg via ORAL
  Filled 2011-12-28: qty 4

## 2011-12-28 MED ORDER — METOPROLOL TARTRATE 25 MG PO TABS
25.0000 mg | ORAL_TABLET | Freq: Two times a day (BID) | ORAL | Status: DC
  Administered 2011-12-28 – 2011-12-31 (×6): 25 mg via ORAL
  Filled 2011-12-28 (×7): qty 1

## 2011-12-28 MED ORDER — NITROGLYCERIN 2 % TD OINT
0.5000 [in_us] | TOPICAL_OINTMENT | Freq: Once | TRANSDERMAL | Status: AC
  Administered 2011-12-28: 0.5 [in_us] via TOPICAL
  Filled 2011-12-28: qty 1

## 2011-12-28 MED ORDER — IOHEXOL 350 MG/ML SOLN
100.0000 mL | Freq: Once | INTRAVENOUS | Status: AC | PRN
  Administered 2011-12-28: 100 mL via INTRAVENOUS

## 2011-12-28 NOTE — ED Notes (Signed)
Pt denies CP, SOB, N/V/D, HA, & dizziness, pt c/o fast heartbeat, pt on cardiac monitor, NAD

## 2011-12-28 NOTE — ED Provider Notes (Signed)
History     CSN: [id number]  Arrival date & time 12/28/11  1655   First MD Initiated Contact with Patient 12/28/11 1702      Chief Complaint  Patient presents with  . Atrial Fibrillation    (Consider location/radiation/quality/duration/timing/severity/associated sxs/prior treatment) HPI Comments: The patient is an 78 year old female with past medical history of coronary artery disease, diabetes mellitus, hypertension, and hyperlipidemia, without previous myocardial infarction. She presents for evaluation of palpitations and irregular heart beat is been worse over the last 3 days. She reports that the palpitations are frequent and that they have been accompanied by generalized fatigue, shortness of breath especially on exertion, and occasional dull retrosternal, nonradiating, moderate severity chest discomfort. She denies any chest pain at present. She does report lower extremity edema at the feet. She reports to me that she has a history of always having an irregular heartbeat but that this has been worse over the past 3 days.  The history is provided by the patient.    Past Medical History  Diagnosis Date  . CVA (cerebral vascular accident)   . CAD (coronary artery disease)   . Vitamin D deficiency   . Diabetes mellitus, type 2   . HTN (hypertension)   . HLD (hyperlipidemia)   . Tremor, essential   . Urinary incontinence     Past Surgical History  Procedure Date  . Appendectomy 1965  . Vaginal hysterectomy 1965  . Knee surgery 1989, 1995    Family History  Problem Relation Age of Onset  . Diabetes Mother   . Diabetes Brother   . Stroke Father     History  Substance Use Topics  . Smoking status: Current Everyday Smoker    Types: Cigarettes  . Smokeless tobacco: Not on file   Comment: does not smoke much...  . Alcohol Use: Not on file    OB History    Grav Para Term Preterm Abortions TAB SAB Ect Mult Living                  Review of Systems    Constitutional: Positive for fatigue. Negative for fever, chills, diaphoresis, activity change and appetite change.  HENT: Negative for congestion, rhinorrhea and neck pain.   Eyes: Negative.  Negative for visual disturbance.  Respiratory: Positive for shortness of breath. Negative for cough, choking, chest tightness, wheezing and stridor.   Cardiovascular: Positive for chest pain, palpitations and leg swelling.  Gastrointestinal: Negative for nausea, vomiting, abdominal pain, diarrhea, blood in stool and abdominal distention.  Genitourinary: Negative.   Musculoskeletal: Negative.   Skin: Negative.   Neurological: Positive for tremors. Negative for dizziness, syncope, weakness, light-headedness, numbness and headaches.  Hematological: Does not bruise/bleed easily.  Psychiatric/Behavioral: Negative.     Allergies  Codeine and Penicillins  Home Medications   Current Outpatient Rx  Name Route Sig Dispense Refill  . ACETAMINOPHEN ER 650 MG PO TBCR Oral Take 650 mg by mouth as needed.      . ALBUTEROL SULFATE (2.5 MG/3ML) 0.083% IN NEBU Nebulization Take 3 mLs (2.5 mg total) by nebulization every 6 (six) hours as needed for wheezing. 75 mL 12  . BLACK COHOSH 160 MG PO CAPS Oral Take by mouth.      [name] CALCIUM PO Oral Take by mouth.      . B-12 100 MCG PO TABS Oral Take by mouth.      Di Kindle SULFATE 325 (65 FE) MG PO TABS Oral Take 325 mg by mouth daily  with breakfast.      . GINKGO BILOBA 120 MG PO CAPS Oral Take by mouth.      [name] PRIMIDONE 50 MG PO TABS Oral Take 1 tablet (50 mg total) by mouth at bedtime. 30 tablet 0  . B-6 100 MG PO TABS Oral Take by mouth.      . SPACER/AERO-HOLDING CHAMBERS DEVI Does not apply 1 each by Does not apply route daily. 1 each 0    BP 154/91  Pulse 86  Temp(Src) 98.5 F (36.9 C) (Oral)  Resp 20  Ht 5' (1.524 m)  Wt 120 lb (54.432 kg)  BMI 23.44 kg/m2  SpO2 98%  Physical Exam  Nursing note and vitals reviewed. Constitutional: She is oriented  to person, place, and time. She appears well-developed and well-nourished. No distress.  HENT:  Head: Normocephalic and atraumatic.  Mouth/Throat: Oropharynx is clear and moist.  Eyes: EOM are normal. Pupils are equal, round, and reactive to light.  Neck: Normal range of motion. Neck supple. No JVD present. No tracheal deviation present.  Cardiovascular: Normal heart sounds and intact distal pulses.  An irregular rhythm present. Frequent extrasystoles are present. Tachycardia present.  Exam reveals no gallop, no S3, no S4 and no friction rub.   No murmur heard. Pulmonary/Chest: Breath sounds normal. No accessory muscle usage or stridor. Not tachypneic. No respiratory distress. She has no wheezes. She has no rales. She exhibits no tenderness.  Abdominal: Soft. Bowel sounds are normal. She exhibits no distension. There is no tenderness.  Musculoskeletal: Normal range of motion. She exhibits no edema and no tenderness.       Right ankle: She exhibits swelling. She exhibits normal range of motion, no ecchymosis, no deformity and normal pulse.       Left ankle: She exhibits swelling. She exhibits normal range of motion, no deformity and normal pulse.  Neurological: She is alert and oriented to person, place, and time. She has normal reflexes. No cranial nerve deficit. Coordination normal.  Skin: Skin is warm and dry. No rash noted. She is not diaphoretic. No erythema. No pallor.  Psychiatric: She has a normal mood and affect. Her behavior is normal. Judgment and thought content normal.    ED Course  Procedures (including critical care time)   Date: 12/28/2011  Rate: 110  Rhythm: sinus tachycardia and premature ventricular contractions (PVC)  QRS Axis: normal  Intervals: normal  ST/T Wave abnormalities: normal  Conduction Disutrbances:none  Narrative Interpretation: Sinus tachycardia with frequent premature ventricular contractions  Old EKG Reviewed: none available    Labs Reviewed  CBC   DIFFERENTIAL  BASIC METABOLIC PANEL  MAGNESIUM  CARDIAC PANEL(CRET KIN+CKTOT+MB+TROPI)   No results found.   No diagnosis found.    MDM  Anemia, arrhythmia, electrolyte abnormality, myocardial infarction, congestive heart failure, cardiomyopathy, pulmonary embolism, pneumonia are all entertained as potential differential diagnoses in the patient's care amongst other etiologies.        Felisa Bonier, MD 12/28/11 [phone]

## 2011-12-28 NOTE — ED Notes (Signed)
Received bedside report from [place], [place].  Patient currently sitting up in bed; no respiratory or acute distress noted.  Patient updated on plan of care; informed patient that we are currently waiting for the EDP to come and talk to patient about test results.  Patient has no other questions or concerns at this time.  Introduced self to patient and updated whiteboard in room.  Will continue to monitor.

## 2011-12-28 NOTE — H&P (Signed)
Family Medicine Teaching [facility] Admission History and Physical  Patient name: [name] Medical record number: [id number] Date of birth: 10-12-1920 Age: 78 y.o. Gender: female  Primary Care Provider: Rodman Pickle, MD, MD  Chief Complaint: Chest Pain and Palpitations History of Present Illness: [name] is a 78 y.o. year old female  With a history of CAD s/p MI 20 years ago, HTN, HLD, DM, history of stroke, history Carotid artery disease, tobacco abuse presenting with chest pain, shortness of breath, and palpitations.   Patient states for the last 2 weeks she has felt intermittent palpitations about once a day lasting for 15 minutes at a time. During this time, she feels some "pins and pressure" in her chest as well as very mild shortness of breath. This usually happens when she wakes up but this has also happened with activity such as when she has been in the grocery store. 2 nights ago, she said it felt like her heart was beating out of her chest and she thought she might have been having a heart attack. Patient states for about 3 months she gets slightly short of breath when walking around house or grocery store which resolves with rest. There has been no change in this. Denies Orthopnea or PND.   Of note, patient reported palpitations at an office visit in January of 2012. She had a holter monitor placed which she never returned. She was on Propranolol at that time for her essential tremor and palpitations. She did not have a follow up visit and did not refill her propranolol. She was also having some "blacking out" episodes during this time which she does not report today except for occasionally having some light headedness with her episodes.   Today at around 1pm, she noted another spell of the palpitations and chest discomfort which lasted for about 45 minutes. The length of the episode made her call 911 for further assistance. She says it resolved on it's own.  Intermittently since that time she has felt short spells lasting up to 3 minutes (friends at bedside report this correspond to when her HR is above 100).   In the ED, there was concern for CHF exacerbation as DOE, orthopnea, intermittent CP reported  ED physician with BNP of 550 and some vascular prominence and borderline cardiomegaly on CXR and possible edema. Due to SOB, D-dimer was obtained and was elevated. CT angiogram was then performed and was negative for PE. CT also showed minimal interstitial edema, normal size cardiac silhouette, and no pleural effusions.   Admitted to [facility] Medicine teaching service for further management of chest pain.   Past Medical History  Diagnosis Date  . CVA (cerebral vascular accident)   . CAD (coronary artery disease)   . Vitamin D deficiency   . Diabetes mellitus, type 2   . HTN (hypertension)   . HLD (hyperlipidemia)   . Tremor, essential   . Urinary incontinence   . DIABETES-TYPE 2 07/16/2007  . HYPERLIPIDEMIA 03/03/2007  . TOBACCO USER 12/25/2009  . HYPERTENSION, ESSENTIAL NOS 08/07/2007  . CAROTID ARTERY DISEASE 07/16/2007  . C V A/STROKE 07/16/2007  . Palpitations 02/08/2010  . High risk social situations 11/28/2011    Past Surgical History  Procedure Date  . Appendectomy 1965  . Vaginal hysterectomy 1965  . Knee surgery 1989, 1995    Family History  Problem Relation Age of Onset  . Diabetes Mother   . Diabetes Brother   . Stroke Father  Social History: Smokes almost 1 pack per day despite visit in December when she said she had been cutting back and interested in quitting. No alcohol or drug use.  Lives at home alone. Able to cook, bathe and cleans by herself. Walks with cane and crutch. Has a wheelchair but not able to use it. Has had discussions about getting a power chair. Has some support from church friends. Has "2 adopted daughters" here with her tonight.  Lives off social security, retired from [employer] in 1988. After  mortgage, has $400 left.  Does not have a POA if anything should happen to her. Has 2 nieces, 2 nephews that live far away. (Only one nephew "knows that I am alive")  She does want CPR attempted but does not wish to have too much done. Organ donor/donating body to science.   Allergies:  Allergies  Allergen Reactions  . Codeine   . Penicillins     Medications Prior to Admission  Medication Dose Route Frequency Provider Last Rate Last Dose  . aspirin chewable tablet 324 mg  324 mg Oral Once Felisa Bonier, MD   324 mg at 12/28/11 1610  . furosemide (LASIX) injection 40 mg  40 mg Intravenous Once Felisa Bonier, MD   40 mg at 12/28/11 2223  . iohexol (OMNIPAQUE) 350 MG/ML injection 100 mL  100 mL Intravenous Once PRN Medication Radiologist   100 mL at 12/28/11 2216  . nitroGLYCERIN (NITROGLYN) 2 % ointment 0.5 inch  0.5 inch Topical Once Felisa Bonier, MD   0.5 inch at 12/28/11 2221   Medications Prior to Admission  Medication Sig Dispense Refill  . acetaminophen (TYLENOL ARTHRITIS PAIN) 650 MG CR tablet Take 650 mg by mouth as needed. For arthritis pain.      [name] albuterol (PROVENTIL) (2.5 MG/3ML) 0.083% nebulizer solution Take 2.5 mg by nebulization every 6 (six) hours as needed. For wheezing.       . Black Cohosh 160 MG CAPS Take 160 mg by mouth daily.       [name] CALCIUM PO Take 1 tablet by mouth 2 (two) times daily.       . Cyanocobalamin (B-12) 100 MCG TABS Take 100 mcg by mouth daily.       . ferrous sulfate 325 (65 FE) MG tablet Take 325 mg by mouth daily with breakfast.        . Ginkgo Biloba 120 MG CAPS Take 120 mg by mouth daily.       . primidone (MYSOLINE) 50 MG tablet Take 50 mg by mouth at bedtime.        . Pyridoxine HCl (B-6) 100 MG TABS Take 100 mg by mouth daily.         Results for orders placed during the hospital encounter of 12/28/11 (from the past 48 hour(s))  CBC     Status: Abnormal   Collection Time   12/28/11  6:08 PM      Component Value Range Comment    WBC 7.0  4.0 - 10.5 (K/uL)    RBC 4.09  3.87 - 5.11 (MIL/uL)    Hemoglobin 12.1  12.0 - 15.0 (g/dL)    HCT 96.0 (*) 45.4 - 46.0 (%)    MCV 86.8  78.0 - 100.0 (fL)    MCH 29.6  26.0 - 34.0 (pg)    MCHC 34.1  30.0 - 36.0 (g/dL)    RDW 09.8 (*) 11.9 - 15.5 (%)    Platelets 302  150 -  400 (K/uL)   DIFFERENTIAL     Status: Abnormal   Collection Time   12/28/11  6:08 PM      Component Value Range Comment   Neutrophils Relative 79 (*) 43 - 77 (%)    Neutro Abs 5.6  1.7 - 7.7 (K/uL)    Lymphocytes Relative 10 (*) 12 - 46 (%)    Lymphs Abs 0.7  0.7 - 4.0 (K/uL)    Monocytes Relative 10  3 - 12 (%)    Monocytes Absolute 0.7  0.1 - 1.0 (K/uL)    Eosinophils Relative 0  0 - 5 (%)    Eosinophils Absolute 0.0  0.0 - 0.7 (K/uL)    Basophils Relative 1  0 - 1 (%)    Basophils Absolute 0.0  0.0 - 0.1 (K/uL)   BASIC METABOLIC PANEL     Status: Abnormal   Collection Time   12/28/11  6:08 PM      Component Value Range Comment   Sodium 139  135 - 145 (mEq/L)    Potassium 4.7  3.5 - 5.1 (mEq/L)    Chloride 108  96 - 112 (mEq/L)    CO2 23  19 - 32 (mEq/L)    Glucose, Bld 102 (*) 70 - 99 (mg/dL)    BUN 21  6 - 23 (mg/dL)    Creatinine, Ser 1.61  0.50 - 1.10 (mg/dL)    Calcium 9.4  8.4 - 10.5 (mg/dL)    GFR calc non Af Amer 75 (*) >90 (mL/min)    GFR calc Af Amer 86 (*) >90 (mL/min)   MAGNESIUM     Status: Normal   Collection Time   12/28/11  6:08 PM      Component Value Range Comment   Magnesium 2.2  1.5 - 2.5 (mg/dL)   CARDIAC PANEL(CRET KIN+CKTOT+MB+TROPI)     Status: Normal   Collection Time   12/28/11  6:11 PM      Component Value Range Comment   Total CK 93  7 - 177 (U/L)    CK, MB 2.8  0.3 - 4.0 (ng/mL)    Troponin I <0.30  <0.30 (ng/mL)    Relative Index RELATIVE INDEX IS INVALID  0.0 - 2.5    PRO B NATRIURETIC PEPTIDE     Status: Abnormal   Collection Time   12/28/11  6:11 PM      Component Value Range Comment   Pro B Natriuretic peptide (BNP) 547.8 (*) 0 - 450 (pg/mL)   D-DIMER,  QUANTITATIVE     Status: Abnormal   Collection Time   12/28/11  6:12 PM      Component Value Range Comment   D-Dimer, Quant 1.96 (*) 0.00 - 0.48 (ug/mL-FEU)    Dg Chest 2 View  12/28/2011  *RADIOLOGY REPORT*  Clinical Data: 78 year old female with palpitations and chest discomfort.  CHEST - 2 VIEW  Comparison: 01/29/2006 and 01/07/2005  Findings: Cardiomegaly and pulmonary vascular congestion noted. Mild interstitial opacities are noted and may represent minimal interstitial edema. There is no evidence of pleural effusion or pneumothorax. COPD/emphysema is noted. No acute bony abnormalities are present. A moderate to severe lower thoracic scoliosis and degenerative changes in the right shoulder are noted.  IMPRESSION: Cardiomegaly with pulmonary vascular congestion and probable minimal interstitial pulmonary edema.  COPD/ emphysema.  Original Report Authenticated By: Rosendo Gros, M.D.   Ct Angio Chest W/cm &/or Wo Cm  12/28/2011  *RADIOLOGY REPORT*  Clinical Data:  Palpitations, extremity swelling, palpitations.  CT ANGIOGRAPHY CHEST WITH CONTRAST  Technique:  Multidetector CT imaging of the chest was performed using the standard protocol during bolus administration of intravenous contrast.  Multiplanar CT image reconstructions including MIPs were obtained to evaluate the vascular anatomy.  Contrast: OMNIPAQUE IOHEXOL 350 MG/ML IV SOLN  Comparison:  12/28/2011 radiograph  Findings:  No pulmonary arterial branch filling defect identified.  Calcified mediastinal and left hilar lymph nodes.  Left greater than right apical scarring.  Mild left upper lobe linear and ground-glass opacity. Mild apical bullous changes. Centrolobular emphysematous changes.  Small amount of debris layers dependently within the main bronchi.  Heterogeneous density and enlarged right lobe of the thyroid gland. Mild ectasia of the aortic arch with scattered atherosclerotic calcification.  No dissection.  Coronary artery  calcification.  Aortic valve and mitral valve calcification.  Heart size within normal limits.  Trace pericardial fluid versus thickening.  No pleural effusion.  Limited images through the upper abdomen demonstrate no acute abnormality.  Calcifications scattered throughout the spleen.  Multilevel degenerative changes.  No acute osseous abnormality identified.  There is rightward curvature of the lower thoracic spine. Advanced right glenohumeral joint DJD.  Review of the MIP images confirms the above findings.  IMPRESSION: No pulmonary arterial filling defect identified.  Coronary artery, aortic, and mitral valve calcification.  Centrolobular emphysematous changes.  Linear and ground-glass opacity within the left upper lobe may represent a combination of scarring, atelectasis, and infiltrate.  Original Report Authenticated By: Waneta Martins, M.D.   *EKG with irregular sinus tachycardia but p waves before every qrs. No heart block. No st/t wave changes  ROS negative except as noted in HPI with the following additions-hearing loss (not new) in left ear, vision changes over several years requiring glasses occasionally, chronic cough for years (reported related to asthma)   Blood pressure 151/73, pulse 83, temperature 98 F (36.7 C), temperature source Oral, resp. rate 22, height 5' (1.524 m), weight 120 lb (54.432 kg), SpO2 100.00%. Physical Exam  Vitals reviewed. Constitutional: She is oriented to person, place, and time. No distress.       Thin elderly female appearing stated age.    HENT:  Head: Normocephalic and atraumatic.  Right Ear: External ear normal.  Left Ear: External ear normal.  Mouth/Throat: Oropharynx is clear and moist.  Eyes: Pupils are equal, round, and reactive to light.  Neck: No JVD present. No tracheal deviation present. No thyromegaly present.  Cardiovascular: Exam reveals no gallop and no friction rub.   No murmur heard.      Irregularly irregular. Monitor shows p  waves before every QRS with occasional PVCs.   Respiratory: Effort normal and breath sounds normal. No respiratory distress. She has no wheezes. She has no rales. She exhibits no tenderness.  GI: Soft. Bowel sounds are normal. She exhibits no distension. There is no tenderness. There is no rebound and no guarding.  Musculoskeletal: She exhibits no edema.  Neurological: She is alert and oriented to person, place, and time.       Very talkative and interactive.   Skin: She is not diaphoretic.  Psychiatric: She has a normal mood and affect.     Assessment/Plan [name] is a 78 y.o. year old female  With a history of CAD s/p MI 20 years ago, HTN, HLD, DM, history of stroke, history Carotid artery disease, tobacco abuse presenting with chest pain, shortness of breath, and palpitations.   1. Chest Pain-suspect most likely related to palpitations, rapid  heart rate-see problem #2. No active chest pain. Will rule out MI. DDx includes GERD, MSK pain, anxiety. No PTX on CXR. No PE with negative CT angiogram. EKG with irregular sinus tachycardia but p waves before every qrs. No heart block. No st/t wave changes.   [ ] AM EKG  [ ] cycle cardiac enzymes x3. Negative x1  [ ] Risk stratification with TSH, Lipids, Alc in AM as has not had these in last year.   *monitor on telemetry  *ASA 81mg   *will consider cardiology consult if chest pain recurrent. Likely would benefit from outpatient stress test.  2. Irregular heart beat/palpitations  *recurrent issue at least since January of this year. Patient did not bring back holter monitor at that time. Had been on propranolol previously. Due to concern for  possible COPD, will not restart at this time. Instead will start metoprolol 25 BID.   [ ] will get echo to evaluate for structural abnormalities as potential cause of irregular heart beat. Last echo 2007.  3. ED concern for CHF-BNP equivocal. No signs on physical exam. Received Lasix 40 IV in ED. Will  monitor ins and outs but will not treat at this time. Daily weights.  4. DM-will obtain a1c. Given glucose levels <120 noted on previous BMETS and no treatment previously required. Will monitor CBGs with meals but will not start sliding scale.  5. HTN-poor control without medication-will start metoprolol per problem #2.  6. HLD-will get fasting lipids. WIll consider statin.  7. Essential tremor-continue primidone per home meds.  8. COPD?/Tobacco abuse-will write for tobacco abuse counselor. Will need outpatient PFTs as requested in last outpatient visit. Not currently requesting nicotine patch. Albuterol prn for wheezing per home meds.  9. Code Status: Full Code although patient wishes for it to not be prolonged.   FEN/GI-carb modified. No IVF. AM BMET.  PPx-heparin sq Disposition-pending further evaluation and management. Will rule out MI  HUNTER, STEPHEN 12/28/2011, 11:05 PM   Pt seen and examined with Dr. Durene Cal.  I agree with the history as listed above.  I helped formulate the above assessment and plan and agree with its contents.  Javiana Anwar 12/29/2011, 4:41 AM

## 2011-12-28 NOTE — ED Notes (Signed)
Patient currently sitting up in bed; no respiratory or acute distress noted.  Patient updated on plan of care; informed patient that a bed request has been put in and that we are currently waiting on a bed to become available.  Patient has no other questions or concerns at this time.  Family present at bedside.  Will continue to monitor.

## 2011-12-28 NOTE — ED Notes (Signed)
Patient currently sitting up in bed; no respiratory or acute distress noted.  Patient updated on plan of care; informed patient that the EDP has made a consult to family practice.  Patient has no other questions or concerns at this time.  Family present at bedside.  Will continue to monitor.

## 2011-12-28 NOTE — ED Notes (Signed)
Assisted patient on bedpan.

## 2011-12-28 NOTE — ED Notes (Signed)
Patient being transported to CT

## 2011-12-28 NOTE — ED Notes (Signed)
Patient currently sitting up in bed; no respiratory or acute distress noted.  Patient updated on plan of care; informed patient that she will be sent for a CT scan.  Patient has no other questions or concerns at this time.  Will continue to monitor.

## 2011-12-28 NOTE — ED Notes (Signed)
Patient currently sitting up in bed; no respiratory or acute distress noted.  Patient cleaned of urine; changed gown and linens.  Patient has no questions or concerns at this time.  Will continue to monitor.

## 2011-12-28 NOTE — ED Notes (Signed)
Patient with reported palpitations and irregular heartrate for 3 days.  She also reports extremity swelling.  Patient denies any chest pain.

## 2011-12-29 ENCOUNTER — Other Ambulatory Visit: Payer: Self-pay

## 2011-12-29 DIAGNOSIS — R0789 Other chest pain: Secondary | ICD-10-CM

## 2011-12-29 DIAGNOSIS — R269 Unspecified abnormalities of gait and mobility: Secondary | ICD-10-CM

## 2011-12-29 DIAGNOSIS — R5381 Other malaise: Secondary | ICD-10-CM

## 2011-12-29 DIAGNOSIS — R5383 Other fatigue: Secondary | ICD-10-CM

## 2011-12-29 LAB — CBC
MCH: 29.3 pg (ref 26.0–34.0)
MCHC: 33.7 g/dL (ref 30.0–36.0)
MCV: 86.9 fL (ref 78.0–100.0)
Platelets: 316 10*3/uL (ref 150–400)
RBC: 4.44 MIL/uL (ref 3.87–5.11)

## 2011-12-29 LAB — BASIC METABOLIC PANEL
BUN: 18 mg/dL (ref 6–23)
CO2: 23 mEq/L (ref 19–32)
Calcium: 9.5 mg/dL (ref 8.4–10.5)
Creatinine, Ser: 0.75 mg/dL (ref 0.50–1.10)
GFR calc non Af Amer: 73 mL/min — ABNORMAL LOW (ref >90)
Glucose, Bld: 125 mg/dL — ABNORMAL HIGH (ref 70–99)
Sodium: 140 mEq/L (ref 135–145)

## 2011-12-29 LAB — HEMOGLOBIN A1C
Hgb A1c MFr Bld: 6.5 % — ABNORMAL HIGH (ref <5.7)
Mean Plasma Glucose: 140 mg/dL — ABNORMAL HIGH (ref <117)

## 2011-12-29 LAB — LIPID PANEL
HDL: 70 mg/dL (ref >39)
LDL Cholesterol: 142 mg/dL — ABNORMAL HIGH (ref 0–99)

## 2011-12-29 LAB — GLUCOSE, CAPILLARY: Glucose-Capillary: 109 mg/dL — ABNORMAL HIGH (ref 70–99)

## 2011-12-29 MED ORDER — METOPROLOL TARTRATE 25 MG PO TABS: 25.0000 mg | ORAL_TABLET | Freq: Two times a day (BID) | ORAL | Status: DC

## 2011-12-29 MED ORDER — SODIUM CHLORIDE 0.9 % IV SOLN: 250.0000 mL | INTRAVENOUS | Status: DC | PRN

## 2011-12-29 MED ORDER — ACETAMINOPHEN 325 MG PO TABS: 650.0000 mg | ORAL_TABLET | Freq: Four times a day (QID) | ORAL | Status: DC | PRN

## 2011-12-29 MED ORDER — SODIUM CHLORIDE 0.9 % IJ SOLN: 3.0000 mL | INTRAMUSCULAR | Status: DC | PRN

## 2011-12-29 MED ORDER — ASPIRIN 81 MG PO CHEW
81.0000 mg | CHEWABLE_TABLET | Freq: Every day | ORAL | Status: DC
  Administered 2011-12-29 – 2011-12-31 (×3): 81 mg via ORAL
  Filled 2011-12-29 (×3): qty 1

## 2011-12-29 MED ORDER — PRIMIDONE 50 MG PO TABS
50.0000 mg | ORAL_TABLET | Freq: Every day | ORAL | Status: DC
  Administered 2011-12-29 – 2011-12-30 (×2): 50 mg via ORAL
  Filled 2011-12-29 (×3): qty 1

## 2011-12-29 MED ORDER — INSULIN ASPART 100 UNIT/ML ~~LOC~~ SOLN
0.0000 [IU] | Freq: Three times a day (TID) | SUBCUTANEOUS | Status: DC
  Administered 2011-12-30 (×2): 1 [IU] via SUBCUTANEOUS
  Filled 2011-12-29: qty 3

## 2011-12-29 MED ORDER — ONDANSETRON HCL 4 MG/2ML IJ SOLN: 4.0000 mg | Freq: Four times a day (QID) | INTRAMUSCULAR | Status: DC | PRN

## 2011-12-29 MED ORDER — ONDANSETRON HCL 4 MG PO TABS: 4.0000 mg | ORAL_TABLET | Freq: Four times a day (QID) | ORAL | Status: DC | PRN

## 2011-12-29 MED ORDER — HEPARIN SODIUM (PORCINE) 5000 UNIT/ML IJ SOLN
5000.0000 [IU] | Freq: Three times a day (TID) | INTRAMUSCULAR | Status: DC
  Administered 2011-12-29 – 2011-12-31 (×8): 5000 [IU] via SUBCUTANEOUS
  Filled 2011-12-29 (×10): qty 1

## 2011-12-29 MED ORDER — DOCUSATE SODIUM 100 MG PO CAPS
100.0000 mg | ORAL_CAPSULE | Freq: Two times a day (BID) | ORAL | Status: DC | PRN
  Filled 2011-12-29: qty 1

## 2011-12-29 MED ORDER — ALBUTEROL SULFATE (5 MG/ML) 0.5% IN NEBU: 2.5000 mg | INHALATION_SOLUTION | RESPIRATORY_TRACT | Status: DC | PRN

## 2011-12-29 MED ORDER — SODIUM CHLORIDE 0.9 % IJ SOLN
3.0000 mL | Freq: Two times a day (BID) | INTRAMUSCULAR | Status: DC
  Administered 2011-12-29 – 2011-12-31 (×5): 3 mL via INTRAVENOUS

## 2011-12-29 MED ORDER — FERROUS SULFATE 325 (65 FE) MG PO TABS
325.0000 mg | ORAL_TABLET | Freq: Every day | ORAL | Status: DC
  Administered 2011-12-29 – 2011-12-31 (×3): 325 mg via ORAL
  Filled 2011-12-29 (×4): qty 1

## 2011-12-29 MED ORDER — ASPIRIN 81 MG PO CHEW: 81.0000 mg | CHEWABLE_TABLET | Freq: Every day | ORAL | Status: DC

## 2011-12-29 MED ORDER — ALUM & MAG HYDROXIDE-SIMETH 200-200-20 MG/5ML PO SUSP: 30.0000 mL | Freq: Four times a day (QID) | ORAL | Status: DC | PRN

## 2011-12-29 NOTE — ED Notes (Signed)
Patient currently resting quietly in bed; no respiratory or acute distress noted.  Patient updated on plan of care; informed patient that a bed request has been put in and that we are currently waiting for a clean room.  Patient has no other questions or concerns at this time.  Family present at bedside.  Will continue to monitor.

## 2011-12-29 NOTE — ED Notes (Signed)
Patient currently sitting up in bed; no respiratory or acute distress noted.  Patient updated on plan of care; informed patient we are waiting for room to be cleaned.  Charge RN Reita Cliche) notified that room has been pending dirty for at least an hour.  Patient has no other questions or concerns at this time.  Will continue to monitor.

## 2011-12-29 NOTE — ED Notes (Signed)
Calling report now. 

## 2011-12-29 NOTE — H&P (Signed)
FMTS Attending Admission Note  Patient seen and examined by me, discussed with resident team.  I have reviewed Dr. Radonna Ricker assessment and plan and agree with his management. Briefly, an 62 yoF with known CAD, presenting with episodic intermittent feelings of palpitations and weakness; has occurred intermittently over many years, more frequently in the past several months. She denies chest pain or dyspnea this morning when I examined her around 5:50am.  Overnight her telemetry showed NSR with several episodes of PVCs, however no atrial fib seen on any ECG or tele.    Exam: She is alert, oriented to place and time; she reports she was a bit disoriented when awakened ("thought I was at home") but quickly regained orientation.  No distress HEENT Neck supple, THyroid supple and non-nodular COR S1S2, mostly regular with occasional PVCs.  PULM clear bilaterally, no rales or wheezes LEs without edema; palpable dp pulses bilaterally.   Imp/Plan; Patient presents with somewhat vague symptoms of dyspnea and palpitations, with no dysrhythmias captures on ECG or tele thus far.  Agree with workup as planned, namely TSH, ECHO, serial cardiac enzymes (first set negative thus far). May consider outpatient HOlter monitor at scheduled follow-up in [facility] on Jan 10th. May be discharged later today if feels better and above-listed workup completed and not indicating need to stay.  Paula Compton, MD

## 2011-12-29 NOTE — Progress Notes (Signed)
Patient arrived from ED.  No complaints of pain, resting comfortably, alert and oriented times four, and states to be from home alone.  Running afib on telemetry.  Skin is intact.  Will continue to monitor patient. Markelle Najarian, Joan Mayans, RN

## 2011-12-29 NOTE — Progress Notes (Signed)
Patient ID: [name], female   DOB: 01/19/1920, 78 y.o.   MRN: [id number]  PGY-1 Daily Progress Note Family Medicine Teaching Service [name]. Meredith Sleigh, MD Service Pager: [phone]   Subjective: Patient comfortable this morning. Says she has not felt any palpitations or chest pain since getting up to her room. She has been able to sleep some.   Objective:  Temp:  [97 F (36.1 C)-98.6 F (37 C)] 98.4 F (36.9 C) (01/06 0701) Pulse Rate:  [57-112] 78  (01/06 0701) Resp:  [11-25] 18  (01/06 0701) BP: (115-172)/(62-124) 115/66 mmHg (01/06 0701) SpO2:  [97 %-100 %] 100 % (01/06 0701) Weight:  [120 lb (54.432 kg)-120 lb 3.2 oz (54.522 kg)] 120 lb 3.2 oz (54.522 kg) (01/06 0701)  Intake/Output Summary (Last 24 hours) at 12/29/11 0739 Last data filed at 12/28/11 2352  Gross per 24 hour  Intake      0 ml  Output    700 ml  Net   -700 ml   Telemetry review shows NSR with occasional sinus tachycardia with PVCs. No atrial fibrillation as p's before every QRS complex Gen:  NAD, asleep in bed initially, thin elderly female HEENT: moist mucous membranes CV: irregularly irregular rhythm without tachycardia, no murmurs rubs or gallops PULM: clear to auscultation bilaterally. No wheezes/rales/rhonchi ABD: soft/nontender/nondistended/normal bowel sounds EXT: No edema Neuro: Alert and oriented x3, grossly intact  Labs and imaging:   CBC  Lab 12/29/11 0500 12/28/11 1808  WBC 9.4 7.0  HGB 13.0 12.1  HCT 38.6 35.5*  PLT 316 302   BMET-pending BMET for today  Lab 12/29/11 0500 12/28/11 1808  NA 140 139  K 3.6 4.7  CL 104 108  CO2 23 23  BUN 18 21  CREATININE 0.75 0.70  LABGLOM -- --  GLUCOSE 125* --  CALCIUM 9.5 9.4     CARDIAC PANEL(CRET KIN+CKTOT+MB+TROPI)     Status: Normal   Collection Time   12/28/11  6:11 PM      Component Value Range   Total CK 93  7 - 177 (U/L)   CK, MB 2.8  0.3 - 4.0 (ng/mL)   Troponin I <0.30  <0.30 (ng/mL)   Relative Index RELATIVE  INDEX IS INVALID  0.0 - 2.5   PRO B NATRIURETIC PEPTIDE     Status: Abnormal   Collection Time   12/28/11  6:11 PM      Component Value Range   Pro B Natriuretic peptide (BNP) 547.8 (*) 0 - 450 (pg/mL)  D-DIMER, QUANTITATIVE     Status: Abnormal   Collection Time   12/28/11  6:12 PM      Component Value Range   D-Dimer, Quant 1.96 (*) 0.00 - 0.48 (ug/mL-FEU)  GLUCOSE, CAPILLARY     Status: Abnormal   Collection Time   12/29/11  7:29 AM      Component Value Range   Glucose-Capillary 109 (*) 70 - 99 (mg/dL)   Dg Chest 2 View  12/27/7827  *RADIOLOGY REPORT*  Clinical Data: 78 year old female with palpitations and chest discomfort.  CHEST - 2 VIEW  Comparison: 01/29/2006 and 01/07/2005  Findings: Cardiomegaly and pulmonary vascular congestion noted. Mild interstitial opacities are noted and may represent minimal interstitial edema. There is no evidence of pleural effusion or pneumothorax. COPD/emphysema is noted. No acute bony abnormalities are present. A moderate to severe lower thoracic scoliosis and degenerative changes in the right shoulder are noted.  IMPRESSION: Cardiomegaly with pulmonary vascular congestion and probable minimal interstitial  pulmonary edema.  COPD/ emphysema.  Original Report Authenticated By: [name], M.D.   Ct Angio Chest W/cm &/or Wo Cm  12/28/2011  *RADIOLOGY REPORT*  Clinical Data:  Palpitations, extremity swelling, palpitations.  CT ANGIOGRAPHY CHEST WITH CONTRAST  Technique:  Multidetector CT imaging of the chest was performed using the standard protocol during bolus administration of intravenous contrast.  Multiplanar CT image reconstructions including MIPs were obtained to evaluate the vascular anatomy.  Contrast: OMNIPAQUE IOHEXOL 350 MG/ML IV SOLN  Comparison:  12/28/2011 radiograph  Findings:  No pulmonary arterial branch filling defect identified.  Calcified mediastinal and left hilar lymph nodes.  Left greater than right apical scarring.  Mild left upper  lobe linear and ground-glass opacity. Mild apical bullous changes. Centrolobular emphysematous changes.  Small amount of debris layers dependently within the main bronchi.  Heterogeneous density and enlarged right lobe of the thyroid gland. Mild ectasia of the aortic arch with scattered atherosclerotic calcification.  No dissection.  Coronary artery calcification.  Aortic valve and mitral valve calcification.  Heart size within normal limits.  Trace pericardial fluid versus thickening.  No pleural effusion.  Limited images through the upper abdomen demonstrate no acute abnormality.  Calcifications scattered throughout the spleen.  Multilevel degenerative changes.  No acute osseous abnormality identified.  There is rightward curvature of the lower thoracic spine. Advanced right glenohumeral joint DJD.  Review of the MIP images confirms the above findings.  IMPRESSION: No pulmonary arterial filling defect identified.  Coronary artery, aortic, and mitral valve calcification.  Centrolobular emphysematous changes.  Linear and ground-glass opacity within the left upper lobe may represent a combination of scarring, atelectasis, and infiltrate.  Original Report Authenticated By: [name], M.D.     Assessment  [name] is a 78 y.o. year old female With a history of CAD s/p MI 20 years ago, HTN, HLD, DM, history of stroke, history Carotid artery disease, tobacco abuse presenting with chest pain, shortness of breath, and palpitations.   1. Chest Pain-suspect most likely related to palpitations, rapid heart rate-see problem #2. No active chest pain. Will rule out MI. DDx includes GERD, MSK pain, anxiety. No PTX on CXR. No PE with negative CT angiogram. EKG with irregular sinus tachycardia but p waves before every qrs. No heart block. No st/t wave changes. AM EKG was ordered but did not see in chart-telemetry reviewed with NSR PVCS but no atrial fibrillation.  [ ] cycle cardiac enzymes x3. Negative x1.  Next set at 8am and 4pm.  [ ] Risk stratification with TSH, Lipids, Alc  This morning-all pending.  *continue to monitor on telemetry  *ASA 81mg   *patient would likely benefit from outpatient stress test.   2. Irregular heart beat/palpitations -recurrent issue at least since January of this year. Patient did not bring back holter monitor at that time. Had been on propranolol previously. Due to concern for possible COPD, did not restart at this time.  *Continue new medication metoprolol 25 BID.  [ ] will get echo to evaluate for structural abnormalities as potential cause of irregular heart beat. Last echo 2007.  *has appointment on January 10th and may benefit from Holter Monitor 3. ED concern for CHF-BNP equivocal. No signs on physical exam. Received Lasix 40 IV in ED. Unchanged weight this morning. Negative 700 cc since last night.   4. DM-will obtain a1c. Given glucose levels <120 noted on previous BMETS and no treatment previously required. AM CBG 109. Not on sliding scale, just Will monitor CBGs  with meals.  5. HTN-well controlled since starting metoprolol per problem #2  6. HLD-will get fasting lipids. WIll consider statin.  7. Essential tremor-continue primidone per home meds.  8. COPD?/Tobacco abuse- tobacco abuse counselor ordered. Will need outpatient PFTs as requested in last outpatient visit. Not currently requesting nicotine patch. Albuterol prn for wheezing per home meds.  9. Code Status: Full Code although patient wishes for it to not be prolonged.  FEN/GI-carb modified. No IVF. AM BMET wnl.  PPx-heparin sq  Disposition-potential discharge if cardiac enzymes negative x3 and pending echo results.    Tana Conch, MD PGY1, Family Medicine Teaching Service [phone]

## 2011-12-29 NOTE — ED Notes (Signed)
Report given to Lavinia, RN on 4700.  Patient has no pain at this time; RN has no further questions.  Preparing patient for transport.

## 2011-12-29 NOTE — Discharge Summary (Signed)
Physician Discharge Summary  Patient ID: [name] MRN: [id number] DOB: [birth year] Age: 78 y.o.  Admit date: 12/28/2011 Discharge date: 12/31/2011  PCP: Rodman Pickle, MD, MD of [facility]   Discharge Diagnosis: Principal Problem:  *Chest pain *High Risk Social Situation-patient with no support at home with recommended 24 hour supervision-patient eventually refused.  Active Problems:  DIABETES-TYPE 2  HYPERLIPIDEMIA  TOBACCO USER  HYPERTENSION, ESSENTIAL NOS  Palpitations  CAD (coronary artery disease)  Diastolic CHF, chronic    Hospital Course [name] is a 78 y.o. year old female With a history of CAD s/p MI 20 years ago, HTN, HLD, DM, history of stroke, history Carotid artery disease, tobacco abuse presenting with chest pain, shortness of breath, and palpitations worse for 2 weeks but present for at least a year and specifically 45 minutes in duration on day of admission.   1. Chest Pain/history CAD with MI 20 years ago-tingling/pins associated with palpitations, rapid heart rate. No chest pain without palpitations or elevated HR. Suspect anxiety as primary culprit with palpitations. No PTX on CXR. No PE with negative CT angiogram.  *cardiac enzymes x3. EKG with irregular sinus tachycardia but p waves before every qrs. No heart block. No st/t wave changes.  *Risk stratification-tsh wnl, a1c 6.5. Lipids with LDL 142.  *ASA 81mg  started in house *patient would likely benefit from outpatient stress test.   2. Irregular heart beat/palpitations with Sinus tachycardia and PVC -recurrent issue at least since January of this year. Patient did not bring back holter monitor at that time. Had been on propranolol previously. Due to concern for possible COPD, started on metoprolol 25 BID. Palpitations greatly diminished on this medication with none for 24 hours before discharge *echo without obvious structural abnormalities but did show. Grade II diastolic  dysfunction. EF 55-60%.  * may benefit from Holter Monitor   3. Grade II diastolic CHF-BNP equivocal. No signs on physical exam. Received Lasix 40 IV in ED. Wts stable during hospitalization. I/os net approximately even since admission. No signs of acute failure.  4. DM-a1c 6.5. CBGS typically less than 120. No sliding scale.  5. HTN-well controlled since starting metoprolol per problem #2, blood pressure typically around 110/65 but some systolics closer to 140.  6. HLD-LDL 142, started on statin in house.  7. Essential tremor-continue primidone per home meds.  8. COPD?/Tobacco abuse- tobacco abuse counselor ordered. Will need outpatient PFTs as requested in last outpatient visit. Not currently requesting nicotine patch. Albuterol prn for wheezing per home meds.  9. Code Status: Full Code although patient wishes for it to not be prolonged. Would readdress outpatient.  10. Weakness-PT/OT recommending SNF with 24 hour supervisionbut patient desires to go home to put things in order. Will order CM to give HHPT and HHOT. Vit d pending.    Procedures/Imaging:  Dg Chest 2 View  12/28/2011  *RADIOLOGY REPORT*  Clinical Data: 78 year old female with palpitations and chest discomfort.  CHEST - 2 VIEW  Comparison: 01/29/2006 and 01/07/2005  Findings: Cardiomegaly and pulmonary vascular congestion noted. Mild interstitial opacities are noted and may represent minimal interstitial edema. There is no evidence of pleural effusion or pneumothorax. COPD/emphysema is noted. No acute bony abnormalities are present. A moderate to severe lower thoracic scoliosis and degenerative changes in the right shoulder are noted.  IMPRESSION: Cardiomegaly with pulmonary vascular congestion and probable minimal interstitial pulmonary edema.  COPD/ emphysema.  Original Report Authenticated By: Rosendo Gros, M.D.   Ct Angio Chest  W/cm &/or Wo Cm  12/28/2011  *RADIOLOGY REPORT*  Clinical Data:  Palpitations, extremity swelling,  palpitations.  CT ANGIOGRAPHY CHEST WITH CONTRAST  Technique:  Multidetector CT imaging of the chest was performed using the standard protocol during bolus administration of intravenous contrast.  Multiplanar CT image reconstructions including MIPs were obtained to evaluate the vascular anatomy.  Contrast: OMNIPAQUE IOHEXOL 350 MG/ML IV SOLN  Comparison:  12/28/2011 radiograph  Findings:  No pulmonary arterial branch filling defect identified.  Calcified mediastinal and left hilar lymph nodes.  Left greater than right apical scarring.  Mild left upper lobe linear and ground-glass opacity. Mild apical bullous changes. Centrolobular emphysematous changes.  Small amount of debris layers dependently within the main bronchi.  Heterogeneous density and enlarged right lobe of the thyroid gland. Mild ectasia of the aortic arch with scattered atherosclerotic calcification.  No dissection.  Coronary artery calcification.  Aortic valve and mitral valve calcification.  Heart size within normal limits.  Trace pericardial fluid versus thickening.  No pleural effusion.  Limited images through the upper abdomen demonstrate no acute abnormality.  Calcifications scattered throughout the spleen.  Multilevel degenerative changes.  No acute osseous abnormality identified.  There is rightward curvature of the lower thoracic spine. Advanced right glenohumeral joint DJD.  Review of the MIP images confirms the above findings.  IMPRESSION: No pulmonary arterial filling defect identified.  Coronary artery, aortic, and mitral valve calcification.  Centrolobular emphysematous changes.  Linear and ground-glass opacity within the left upper lobe may represent a combination of scarring, atelectasis, and infiltrate.  Original Report Authenticated By: Waneta Martins, M.D.    Labs  CBC  Lab 12/31/11 0937 12/29/11 0500 12/28/11 1808  WBC 8.6 9.4 7.0  HGB 12.6 13.0 12.1  HCT 39.0 38.6 35.5*  PLT 324 316 302   BMET  Lab 12/31/11  0937 12/29/11 0500 12/28/11 1808  NA 140 140 139  K 3.9 3.6 4.7  CL 105 104 108  CO2 27 23 23   BUN 28* 18 21  CREATININE 0.72 0.75 0.70  CALCIUM 9.1 9.5 9.4  PROT -- -- --  BILITOT -- -- --  ALKPHOS -- -- --  ALT -- -- --  AST -- -- --  GLUCOSE 83 125* 102*   Lab Results  Component Value Date   TSH 2.189 12/29/2011   Patient condition at time of discharge/disposition: stable  Disposition-home       Follow up issues: 1. Please consider  outpatient stress test, although chest pain was minimal and only associated with rapid heart rate.  2. may benefit from Holter Monitor if has palpitations on metoprolol.  3. Will need outpatient PFTs as requested in last outpatient visit. 4. Readdress code status outpatient 5. Please rediscuss SNF as patient was recommended for 24 hour supervision by PT. She is considering selling her house so she can qualify. See how patient is doing with HHPT and HHOT.  6. Please follow up vitamin D level      Discharge follow up:  Follow-up Information    Follow up with Rodman Pickle, MD on 01/02/2012. (3:30pm)    Contact information:   [address] [place] [address] [phone]         Discharge Orders    Future Appointments: Provider: Department: Dept Phone: Center:   01/02/2012 3:30 PM Rodman Pickle, MD [facility] [phone] [facility]     Future Orders Please Complete By Expires   Diet - low sodium heart healthy  Increase activity slowly      (HEART FAILURE PATIENTS) Call MD:  Anytime you have any of the following symptoms: 1) 3 pound weight gain in 24 hours or 5 pounds in 1 week 2) shortness of breath, with or without a dry hacking cough 3) swelling in the hands, feet or stomach 4) if you have to sleep on extra pillows at night in order to breathe.      Call MD for:  extreme fatigue      Call MD for:  persistant dizziness or light-headedness      Call MD for:      Comments:   Chest pain or if you become  ready to go to a nursing home.      Discharge Medications  Dariann, [place]  Home Medication Instructions ZOX:[id number]   Printed on:12/31/11 1341  Medication Information                    CALCIUM PO Take 1 tablet by mouth 2 (two) times daily.            acetaminophen (TYLENOL ARTHRITIS PAIN) 650 MG CR tablet Take 650 mg by mouth as needed. For arthritis pain.           Ginkgo Biloba 120 MG CAPS Take 120 mg by mouth daily.            ferrous sulfate 325 (65 FE) MG tablet Take 325 mg by mouth daily with breakfast.             Black Cohosh 160 MG CAPS Take 160 mg by mouth daily.            Cyanocobalamin (B-12) 100 MCG TABS Take 100 mcg by mouth daily.            Pyridoxine HCl (B-6) 100 MG TABS Take 100 mg by mouth daily.            albuterol (PROVENTIL) (2.5 MG/3ML) 0.083% nebulizer solution Take 2.5 mg by nebulization every 6 (six) hours as needed. For wheezing.            primidone (MYSOLINE) 50 MG tablet Take 50 mg by mouth at bedtime.             aspirin 81 MG chewable tablet Chew 1 tablet (81 mg total) by mouth daily.           metoprolol tartrate (LOPRESSOR) 25 MG tablet Take 1 tablet (25 mg total) by mouth 2 (two) times daily.           simvastatin (ZOCOR) 20 MG tablet Take 1 tablet (20 mg total) by mouth daily at 6 PM.                Tana Conch, MD of [facility] 12/31/2011 1:41 PM

## 2011-12-30 LAB — GLUCOSE, CAPILLARY
Glucose-Capillary: 92 mg/dL (ref 70–99)
Glucose-Capillary: 122 mg/dL — ABNORMAL HIGH (ref 70–99)
Glucose-Capillary: 99 mg/dL (ref 70–99)

## 2011-12-30 MED ORDER — SIMVASTATIN 20 MG PO TABS
20.0000 mg | ORAL_TABLET | Freq: Every day | ORAL | Status: DC
  Administered 2011-12-30: 20 mg via ORAL
  Filled 2011-12-30 (×3): qty 1

## 2011-12-30 NOTE — Progress Notes (Signed)
Family Medicine Teaching Service Attending Note  I discussed patient [name]  with Dr. Durene Cal and reviewed their note for today.  I agree with their assessment and plan.

## 2011-12-30 NOTE — Progress Notes (Signed)
Physical Therapy Evaluation Patient Details Name: [name] MRN: [id number] DOB: July 14, 1920 Today's Date: 12/30/2011  Problem List:  Patient Active Problem List  Diagnoses  . DIABETES-TYPE 2  . DEFICIENCY, VITAMIN D NOS  . HYPERLIPIDEMIA  . TOBACCO USER  . TREMOR, ESSENTIAL, HX OF  . HYPERTENSION, ESSENTIAL NOS  . CAROTID ARTERY DISEASE  . C V A/STROKE  . ARTHROPATHY NEC, SHOULDER  . ARTHRITIS, KNEES, BILATERAL  . OSTEOPOROSIS  . FATIGUE  . ABNORMALITY OF GAIT  . Palpitations  . URINARY INCONTINENCE, URGE  . HX, PERSONAL, THROMBOPHLEBITIS  . SYNCOPE  . ALTERED MENTAL STATUS  . Bronchitis  . High risk social situations  . Chest pain  . CAD (coronary artery disease)    Past Medical History:  Past Medical History  Diagnosis Date  . CVA (cerebral vascular accident)   . CAD (coronary artery disease)   . Vitamin D deficiency   . Diabetes mellitus, type 2   . HTN (hypertension)   . HLD (hyperlipidemia)   . Tremor, essential   . Urinary incontinence   . DIABETES-TYPE 2 07/16/2007  . HYPERLIPIDEMIA 03/03/2007  . TOBACCO USER 12/25/2009  . HYPERTENSION, ESSENTIAL NOS 08/07/2007  . CAROTID ARTERY DISEASE 07/16/2007  . C V A/STROKE 07/16/2007  . Palpitations 02/08/2010  . High risk social situations 11/28/2011   Past Surgical History:  Past Surgical History  Procedure Date  . Appendectomy 1965  . Vaginal hysterectomy 1965  . Knee surgery 1989, 1995    PT Assessment/Plan/Recommendation PT Assessment Clinical Impression Statement: Patient is a delightfull patient that needs continued therapy to address balance and endurance issues.  Given that pt. does not have family at home, will need NHP with therapy.   PT Recommendation/Assessment: Patient will need skilled PT in the acute care venue PT Problem List: Decreased strength;Decreased range of motion;Decreased activity tolerance;Decreased balance;Decreased mobility;Decreased knowledge of use of DME;Decreased safety  awareness;Decreased knowledge of precautions Barriers to Discharge: Decreased caregiver support PT Therapy Diagnosis : Difficulty walking PT Plan PT Frequency: Min 3X/week PT Treatment/Interventions: DME instruction;Gait training;Stair training;Functional mobility training;Therapeutic activities;Therapeutic exercise;Balance training;Patient/family education PT Recommendation Follow Up Recommendations: Skilled nursing facility;Supervision/Assistance - 24 hour Equipment Recommended: Rolling walker with 5" wheels PT Goals  Acute Rehab PT Goals PT Goal Formulation: With patient Time For Goal Achievement: 2 weeks Pt will go Supine/Side to Sit: Independently PT Goal: Supine/Side to Sit - Progress: Other (comment) Pt will go Sit to Stand: Independently PT Goal: Sit to Stand - Progress: Other (comment) Pt will Ambulate: 51 - 150 feet;with modified independence;with least restrictive assistive device PT Goal: Ambulate - Progress: Other (comment) Pt will Go Up / Down Stairs: 3-5 stairs;with supervision;with least restrictive assistive device PT Goal: Up/Down Stairs - Progress: Other (comment)  PT Evaluation Precautions/Restrictions  Precautions Precautions: Fall Precaution Comments: Patient wears bil knee neoprene braces.   Required Braces or Orthoses: No Restrictions Weight Bearing Restrictions: No Prior Functioning  Home Living Lives With: Alone Type of Home: House Home Layout: One level Home Access: Stairs to enter Entrance Stairs-Rails: Right Entrance Stairs-Number of Steps: 4 Bathroom Shower/Tub: Forensic scientist: Standard Bathroom Accessibility: No Home Adaptive Equipment: Shower chair with back;Straight cane;Crutches;Wheelchair - manual;Grab bars in shower (uses 2 canes, one in each hand) Additional Comments: Bar on side of shower tub Prior Function Level of Independence: Independent with basic ADLs;Independent with transfers;Requires assistive device  for independence;Needs assistance with homemaking Driving: No Vocation: Retired Producer, television/film/video: Awake/alert Overall Cognitive Status: Appears within functional  limits for tasks assessed Sensation/Coordination Sensation Light Touch: Appears Intact Stereognosis: Not tested Hot/Cold: Not tested Proprioception: Not tested Coordination Gross Motor Movements are Fluid and Coordinated: Yes Fine Motor Movements are Fluid and Coordinated: Yes Extremity Assessment RUE Assessment RUE Assessment: Within Functional Limits LUE Assessment LUE Assessment: Within Functional Limits RLE Assessment RLE Assessment: Within Functional Limits LLE Assessment LLE Assessment: Within Functional Limits Mobility (including Balance) Bed Mobility Bed Mobility: No Transfers Transfers: Yes Sit to Stand: 5: Supervision;With upper extremity assist;From bed Sit to Stand Details (indicate cue type and reason): Patient needed guard assist but able to perform without physical help.  Patient with poor safety as she uses 2 canes to stand up.   Stand to Sit: 5: Supervision;With upper extremity assist;To bed Stand to Sit Details: Patient needed guard assist again with inability to control descent into chair.   Ambulation/Gait Ambulation/Gait: Yes Ambulation/Gait Assistance: 4: Min assist Ambulation/Gait Assistance Details (indicate cue type and reason): Patient ambulated with min assist at times secondary to poor balance and gait deviations.  Patient ambulated with 2 canes with narrow BOS with canes (canes too tall) with feet widened secondary to pt. knock kneed and pigeon toed.  Educated re: RW which pt was more stable with however she would not keep it close to her and had incr difficulty with turns with RW.  Overall, unsafe with gait throughout.  Ambulated 50 feet with cane and then 300 feet with RW. Ambulation Distance (Feet): 350 Feet Assistive device: Rolling walker (2 canes ) Gait Pattern:  Step-to pattern;Decreased step length - right;Decreased step length - left;Decreased stride length;Decreased hip/knee flexion - right;Decreased hip/knee flexion - left;Right flexed knee in stance;Left flexed knee in stance;Left genu recurvatum;Shuffle;Trunk flexed;Right genu recurvatum (L >R genu recurvatum) Gait velocity: Slow cadence overall Stairs: No Wheelchair Mobility Wheelchair Mobility: No  Posture/Postural Control Posture/Postural Control: Postural limitations Postural Limitations: Flexed posture throughout Balance Balance Assessed: Yes Dynamic Gait Index Level Surface: Mild Impairment Change in Gait Speed: Moderate Impairment Gait with Horizontal Head Turns: Mild Impairment Gait with Vertical Head Turns: Mild Impairment Gait and Pivot Turn: Severe Impairment Step Over Obstacle: Severe Impairment Step Around Obstacles: Moderate Impairment Steps: Moderate Impairment Total Score: 9  High Level Balance High Level Balance Comments: Scored 9/24 on DGI suggestive that pt. is at high risk of falls Exercise    End of Session PT - End of Session Equipment Utilized During Treatment: Gait belt Activity Tolerance: Patient tolerated treatment well Patient left: in bed;with call bell in reach Nurse Communication: Mobility status for transfers;Mobility status for ambulation General Behavior During Session: [facility] for tasks performed Cognition: [facility] for tasks performed  INGOLD,Chang Tiggs 12/30/2011, 4:48 PM [facility] Acute Rehabilitation [phone] (pager)

## 2011-12-30 NOTE — Progress Notes (Signed)
Pt smokes 1 ppd and wants to quit. She is in action stage. She quit cold [place] before and says that's how he plans to quit at this time. Encouraged pt to quit. Referred to 1-800 quit now for f/u and support. Discussed oral fixation substitutes, second hand smoke and in home smoking policy. Reviewed and gave pt Written education/contact information.

## 2011-12-30 NOTE — Clinical Documentation Improvement (Signed)
CHEST PAIN DOCUMENTATION CLARIFICATION QUERY  THIS DOCUMENT IS NOT A PERMANENT PART OF THE MEDICAL RECORD  TO RESPOND TO THE THIS QUERY, FOLLOW THE INSTRUCTIONS BELOW:  1. If needed, update documentation for the patient's encounter via the notes activity.  2. Access this query again and click edit on the In [employer].  3. After updating, or not, click F2 to complete all highlighted (required) fields concerning your review. Select "additional documentation in the medical record" OR "no additional documentation provided".  4. Click Sign note button.  5. The deficiency will fall out of your In Basket *Please let us know if you are not able to complete this workflow by phone or e-mail (listed below).          12/30/11  Dear Dr. Mauricio Po Marton Redwood  In an effort to better capture your patient's severity of illness, reflect appropriate length of stay and utilization of resources, a review of the patient medical record has revealed the following indicators.    Based on your clinical judgment, please clarify and document in a progress note and/or discharge summary the clinical condition associated with the following supporting information:   Possible Clinical Conditions?   Based on your clinical judgment, please clarify cause of Chest Pain:   _______Unstable Angina  _______Musculoskeletal Chest Pain _______Pleuritic Chest Pain _______GERD _______Costochondritis _______Dressler's syndrome _______Chest wall pain _______Other Condition_______________________ _______Cannot Clinically Determine  Supporting Information:  Signs & Symptoms: Admitted with Chest Pain, possibly related to palpitations; enzymes negative.   You may use possible, probable, or suspect with inpatient documentation. possible, probable, suspected diagnoses MUST be documented at the time of discharge  Reviewed:  no additional documentation provided  Thank You,  Marciano Sequin,  Clinical  Documentation Specialist:  Pager: [phone]  Health Information Management [place]  No additional clinical information provided.  Chaska Hagger O

## 2011-12-30 NOTE — Plan of Care (Signed)
Problem: Consults Goal: Tobacco Cessation referral if indicated Outcome: Completed/Met Date Met:  12/30/11 Came to see pt asked her to write down alternative things she could do besides smoke- left material.

## 2011-12-30 NOTE — Clinical Documentation Improvement (Signed)
CHF DOCUMENTATION CLARIFICATION QUERY  THIS DOCUMENT IS NOT A PERMANENT PART OF THE MEDICAL RECORD  TO RESPOND TO THE THIS QUERY, FOLLOW THE INSTRUCTIONS BELOW:  1. If needed, update documentation for the patient's encounter via the notes activity.  2. Access this query again and click edit on the In [employer].  3. After updating, or not, click F2 to complete all highlighted (required) fields concerning your review. Select "additional documentation in the medical record" OR "no additional documentation provided".  4. Click Sign note button.  5. The deficiency will fall out of your In Basket *Please let us know if you are not able to complete this workflow by phone or e-mail (listed below).  Please update your documentation within the medical record to reflect your response to this query.                                                                                    12/30/11  Dear Dr.Chantille Navarrete/ Associates,  In a better effort to capture your patient's severity of illness, reflect appropriate length of stay and utilization of resources, a review of the patient medical record has revealed the following indicators the diagnosis of Heart Failure.    Based on your clinical judgment, please clarify and document in a progress note and/or discharge summary the clinical condition associated with the following supporting information:    Possible Clinical Conditions?  Chronic Systolic Congestive Heart Failure Chronic Diastolic Congestive Heart Failure Chronic Systolic & Diastolic Congestive Heart Failure Acute Systolic Congestive Heart Failure Acute Diastolic Congestive Heart Failure Acute Systolic & Diastolic Congestive Heart Failure Acute on Chronic Systolic Congestive Heart Failure Acute on Chronic Diastolic Congestive Heart Failure Acute on Chronic Systolic & Diastolic  Congestive Heart Failure Other Condition________________________________________ Cannot Clinically  Determine  Supporting Information:  Signs & Symptoms:         Concern for CHF exacerbation as patient presented with DOE and palpitations per ED note. Diagnostics:   CXR results 1/5:    Cardiomegaly with pulmonary vascular congestion and probable minimal interstitial pulmonary edema Treatment:     IV Lasix 40mg  on 12/28/11.  Reviewed:  no additional documentation provided  Thank You,  Marciano Sequin,  Clinical Documentation Specialist:  Pager: [phone]  Health Information Management [place]

## 2011-12-30 NOTE — Progress Notes (Signed)
Clinical Child psychotherapist (CSW) completed psychosocial assessment which can be found in pt shadow chart. CSW spoke with pt who was alert, oriented and pleasant. Pt informed CSW that she would like to return home, eventually sale her home and then consider skilled placement. CSW explored whether pt is open to PT/OT evaluation to determine disposition recommendation. Pt stated she is agreeable for evaluation and recommendation for possible placement. CSW has paged MD with this update.  Theresia Bough, MSW, Theresia Majors [phone]

## 2011-12-30 NOTE — Progress Notes (Signed)
UR Completed.  [name] 12/30/2011 [phone]  

## 2011-12-30 NOTE — Progress Notes (Signed)
  Echocardiogram 2D Echocardiogram has been performed.  [name] 12/30/2011, 9:26 AM

## 2011-12-30 NOTE — Progress Notes (Addendum)
PGY-1 Daily Progress Note Family Medicine Teaching Service [name]. Meredith Sleigh, MD Service Pager: [phone]   Subjective: says had a 2-3 minute period of palpitations all day yesterday but not as severe as previous episodes. Overall, she has felt much better since starting metoprolol.   Patient says she has felt weaker for the last 2 months and realizes that she really needs some help around her home.   Objective:  Temp:  [98 F (36.7 C)] 98 F (36.7 C) (01/07 0407) Pulse Rate:  [75-103] 79  (01/07 0407) Resp:  [18-19] 18  (01/07 0407) BP: (90-120)/(60-78) 116/65 mmHg (01/07 0407) SpO2:  [100 %] 100 % (01/07 0407) Weight:  [108 lb 14.4 oz (49.397 kg)] 108 lb 14.4 oz (49.397 kg) (01/07 0407)  Intake/Output Summary (Last 24 hours) at 12/30/11 0836 Last data filed at 12/30/11 0809  Gross per 24 hour  Intake   1200 ml  Output    900 ml  Net    300 ml    Gen: NAD,  thin elderly female  HEENT: moist mucous membranes  CV: irregularly irregular rhythm without tachycardia, no murmurs rubs or gallops  PULM: clear to auscultation bilaterally. No wheezes/rales/rhonchi  ABD: soft/nontender/nondistended/normal bowel sounds  EXT: No edema  Neuro: Alert and oriented x3, grossly intact  Labs and imaging:   CBC  Lab 12/29/11 0500 12/28/11 1808  WBC 9.4 7.0  HGB 13.0 12.1  HCT 38.6 35.5*  PLT 316 302   BMET  Lab 12/29/11 0500 12/28/11 1808  NA 140 139  K 3.6 4.7  CL 104 108  CO2 23 23  BUN 18 21  CREATININE 0.75 0.70  LABGLOM -- --  GLUCOSE 125* --  CALCIUM 9.5 9.4   CBG (last 3)   Basename 12/30/11 0624 12/29/11 2059 12/29/11 1817  GLUCAP 92 111* 209*   Lab Results  Component Value Date   HGBA1C 6.5* 12/29/2011    Dg Chest 2 View  12/28/2011  *RADIOLOGY REPORT*  Clinical Data: 78 year old female with palpitations and chest discomfort.  CHEST - 2 VIEW  Comparison: 01/29/2006 and 01/07/2005  Findings: Cardiomegaly and pulmonary vascular congestion noted. Mild  interstitial opacities are noted and may represent minimal interstitial edema. There is no evidence of pleural effusion or pneumothorax. COPD/emphysema is noted. No acute bony abnormalities are present. A moderate to severe lower thoracic scoliosis and degenerative changes in the right shoulder are noted.  IMPRESSION: Cardiomegaly with pulmonary vascular congestion and probable minimal interstitial pulmonary edema.  COPD/ emphysema.  Original Report Authenticated By: Rosendo Gros, M.D.   Ct Angio Chest W/cm &/or Wo Cm  12/28/2011  *RADIOLOGY REPORT*  Clinical Data:  Palpitations, extremity swelling, palpitations.  CT ANGIOGRAPHY CHEST WITH CONTRAST  Technique:  Multidetector CT imaging of the chest was performed using the standard protocol during bolus administration of intravenous contrast.  Multiplanar CT image reconstructions including MIPs were obtained to evaluate the vascular anatomy.  Contrast: OMNIPAQUE IOHEXOL 350 MG/ML IV SOLN  Comparison:  12/28/2011 radiograph  Findings:  No pulmonary arterial branch filling defect identified.  Calcified mediastinal and left hilar lymph nodes.  Left greater than right apical scarring.  Mild left upper lobe linear and ground-glass opacity. Mild apical bullous changes. Centrolobular emphysematous changes.  Small amount of debris layers dependently within the main bronchi.  Heterogeneous density and enlarged right lobe of the thyroid gland. Mild ectasia of the aortic arch with scattered atherosclerotic calcification.  No dissection.  Coronary artery calcification.  Aortic valve and  mitral valve calcification.  Heart size within normal limits.  Trace pericardial fluid versus thickening.  No pleural effusion.  Limited images through the upper abdomen demonstrate no acute abnormality.  Calcifications scattered throughout the spleen.  Multilevel degenerative changes.  No acute osseous abnormality identified.  There is rightward curvature of the lower thoracic spine.  Advanced right glenohumeral joint DJD.  Review of the MIP images confirms the above findings.  IMPRESSION: No pulmonary arterial filling defect identified.  Coronary artery, aortic, and mitral valve calcification.  Centrolobular emphysematous changes.  Linear and ground-glass opacity within the left upper lobe may represent a combination of scarring, atelectasis, and infiltrate.  Original Report Authenticated By: Waneta Martins, M.D.     Assessment  [name] is a 78 y.o. year old female With a history of CAD s/p MI 20 years ago, HTN, HLD, DM, history of stroke, history Carotid artery disease, tobacco abuse presenting with chest pain, shortness of breath, and palpitations worse for 2 weeks and specifically 45 minutes in duration on day of admission.  1. Chest Pain-suspect most likely related to palpitations, rapid heart rate-see problem #2. No active chest pain for over 24 hours. . DDx includes GERD, MSK pain, anxiety. No PTX on CXR. No PE with negative CT angiogram. EKG with irregular sinus tachycardia but p waves before every qrs. No heart block. No st/t wave changes. AM EKG was ordered but did not see in chart-telemetry reviewed with NSR PVCS but no atrial fibrillation.  *cardiac enzymes x3.  *Risk stratification-tsh wnl, a1c 6.5. Lipids with LDL 142. Would benefit from a statin.  *continue to monitor on telemetry  *ASA 81mg   *patient would likely benefit from outpatient stress test.   2. Irregular heart beat/palpitations -recurrent issue at least since January of this year. Patient did not bring back holter monitor at that time. Had been on propranolol previously. Due to concern for possible COPD, did not restart at this time.  *Continue new medication metoprolol 25 BID.  [ ] still pending echo to evaluate for structural abnormalities as potential cause of irregular heart beat. Last echo 2007.  *has appointment on January 10th and may benefit from Holter Monitor   3. ED concern for  CHF-BNP equivocal. No signs on physical exam. Received Lasix 40 IV in ED. Negative 500 since admission. Unchanged weight this morning. Weight down 12 lbs since admission-suspect difference in scales.   4. DM-a1c 6.5. CBGS typically less than 120. No sliding scale.   5. HTN-well controlled since starting metoprolol per problem #2, blood pressure typically around 110/65.  6. HLD-LDL 142, patient would benefit from statin. Will prescribe statin at discharge.  7. Essential tremor-continue primidone per home meds.  8. COPD?/Tobacco abuse- tobacco abuse counselor ordered. Will need outpatient PFTs as requested in last outpatient visit. Not currently requesting nicotine patch. Albuterol prn for wheezing per home meds.  9. Code Status: Full Code although patient wishes for it to not be prolonged. Would readdress outpatient.  10Verlan Friends have patient evaluated for PT/OT as she does not feel like she has been able to do as many things at home recently. Will aslo have social work see patient as has established relationship in the past.   FEN/GI-carb modified. No IVF. AM BMET wnl.  PPx-heparin sq  Disposition-discharge after pt/ot/sw  Tana Conch, MD PGY1, Family Medicine Teaching Service [phone]

## 2011-12-30 NOTE — Progress Notes (Signed)
PCP Note Family Medicine Teaching Service Meredith Harts M. Madalen Gavin, MD Service Pager: [phone]  Subjective: [name] is an 78 yo F who was admitted to the hospital for weakness and palpitations. Patient had a cardiac rule out which was negative.  On my exam today, patient is awake, alert, and talkative.  She states she is feeling somewhat better than she did on arrival.  She states she has been weak for a few weeks.  She does live alone at this time.  She states she hasn't had any difficulty getting around at home but she has been weaker than usual.  She has no direct complaints at this time. She states the doctors are taking good care of her, and if she needs to go to a nursing home facility after discharge, that would be ok with her as long as everything can be worked out financially.    Objective: Vital signs in last 24 hours: Temp:  [97.7 F (36.5 C)-98 F (36.7 C)] 97.7 F (36.5 C) (01/07 1355) Pulse Rate:  [59-80] 59  (01/07 1355) Resp:  [17-18] 17  (01/07 1355) BP: (106-134)/(58-84) 106/58 mmHg (01/07 1355) SpO2:  [97 %-100 %] 97 % (01/07 1355) Weight:  [108 lb 14.4 oz (49.397 kg)] 108 lb 14.4 oz (49.397 kg) (01/07 0407) Weight change: 3.2 oz (0.091 kg) Last BM Date: 12/28/11  Intake/Output from previous day: 01/06 0701 - 01/07 0700 In: 960 [P.O.:960] Out: 900 [Urine:900] Intake/Output this shift: Total I/O In: 360 [P.O.:360] Out: -   General appearance: alert, cooperative and no distress Head: Normocephalic, without obvious abnormality, atraumatic Resp: clear to auscultation bilaterally Cardio: regular rate and rhythm, S1, S2 normal, no murmur, click, rub or gallop GI: soft, non-tender; bowel sounds normal; no masses,  no organomegaly Extremities: extremities normal, atraumatic, no cyanosis or edema Neurologic: Mental status: Alert, oriented, thought content appropriate Sensory: normal Motor: grossly normal Gait: Abnormal. Uses canes to walk.  Lab Results:  Basename  12/29/11 0500 12/28/11 1808  WBC 9.4 7.0  HGB 13.0 12.1  HCT 38.6 35.5*  PLT 316 302   BMET  Basename 12/29/11 0500 12/28/11 1808  NA 140 139  K 3.6 4.7  CL 104 108  CO2 23 23  GLUCOSE 125* 102*  BUN 18 21  CREATININE 0.75 0.70  CALCIUM 9.5 9.4    Studies/Results: Dg Chest 2 View  12/28/2011  *RADIOLOGY REPORT*  Clinical Data: 78 year old female with palpitations and chest discomfort.  CHEST - 2 VIEW  Comparison: 01/29/2006 and 01/07/2005  Findings: Cardiomegaly and pulmonary vascular congestion noted. Mild interstitial opacities are noted and may represent minimal interstitial edema. There is no evidence of pleural effusion or pneumothorax. COPD/emphysema is noted. No acute bony abnormalities are present. A moderate to severe lower thoracic scoliosis and degenerative changes in the right shoulder are noted.  IMPRESSION: Cardiomegaly with pulmonary vascular congestion and probable minimal interstitial pulmonary edema.  COPD/ emphysema.  Original Report Authenticated By: Rosendo Gros, M.D.   Ct Angio Chest W/cm &/or Wo Cm  12/28/2011  *RADIOLOGY REPORT*  Clinical Data:  Palpitations, extremity swelling, palpitations.  CT ANGIOGRAPHY CHEST WITH CONTRAST  Technique:  Multidetector CT imaging of the chest was performed using the standard protocol during bolus administration of intravenous contrast.  Multiplanar CT image reconstructions including MIPs were obtained to evaluate the vascular anatomy.  Contrast: OMNIPAQUE IOHEXOL 350 MG/ML IV SOLN  Comparison:  12/28/2011 radiograph  Findings:  No pulmonary arterial branch filling defect identified.  Calcified mediastinal and left hilar  lymph nodes.  Left greater than right apical scarring.  Mild left upper lobe linear and ground-glass opacity. Mild apical bullous changes. Centrolobular emphysematous changes.  Small amount of debris layers dependently within the main bronchi.  Heterogeneous density and enlarged right lobe of the thyroid gland.  Mild ectasia of the aortic arch with scattered atherosclerotic calcification.  No dissection.  Coronary artery calcification.  Aortic valve and mitral valve calcification.  Heart size within normal limits.  Trace pericardial fluid versus thickening.  No pleural effusion.  Limited images through the upper abdomen demonstrate no acute abnormality.  Calcifications scattered throughout the spleen.  Multilevel degenerative changes.  No acute osseous abnormality identified.  There is rightward curvature of the lower thoracic spine. Advanced right glenohumeral joint DJD.  Review of the MIP images confirms the above findings.  IMPRESSION: No pulmonary arterial filling defect identified.  Coronary artery, aortic, and mitral valve calcification.  Centrolobular emphysematous changes.  Linear and ground-glass opacity within the left upper lobe may represent a combination of scarring, atelectasis, and infiltrate.  Original Report Authenticated By: Waneta Martins, M.D.    Medications:  Scheduled:   . aspirin  81 mg Oral Daily  . ferrous sulfate  325 mg Oral Q breakfast  . heparin  5,000 Units Subcutaneous Q8H  . insulin aspart  0-9 Units Subcutaneous TID WC  . metoprolol tartrate  25 mg Oral BID  . primidone  50 mg Oral QHS  . simvastatin  20 mg Oral q1800  . sodium chloride  3 mL Intravenous Q12H   Continuous:  ZOX:WRUEAV chloride, acetaminophen, albuterol, alum & mag hydroxide-simeth, docusate sodium, ondansetron (ZOFRAN) IV, ondansetron, sodium chloride  Assessment/Plan: 78 yo F with weakness and palpitations - Metoprolol for palpitations was a good addition. Will continue to monitor as an outpatient. (Patient may need to be encouraged to get Rx, or perhaps get [name] to deliver to her home when she eventually goes to her house.) - Agree with PT/OT consults for further evaluation - Social work to evaluate patient for assisted living vs. Skilled nursing for short term rehab - Continue home  medications - Will further address code status at next appointment. At last visit, she stated she wanted CPR but not extensive resuscitation  - Patient needs help as an outpatient, and would strongly encourage her to keep her appointments at the [facility]. I know this is difficult given her social situation, and having to rely on public transportation. - Thanks to the inpatient team for their continued care of [name]. I will follow along throughout her hospital course.    LOS: 2 days   [name] 12/30/2011, 3:31 PM

## 2011-12-31 DIAGNOSIS — I5032 Chronic diastolic (congestive) heart failure: Secondary | ICD-10-CM | POA: Diagnosis present

## 2011-12-31 LAB — CBC
MCH: 28.6 pg (ref 26.0–34.0)
MCHC: 32.3 g/dL (ref 30.0–36.0)
MCV: 88.4 fL (ref 78.0–100.0)
Platelets: 324 10*3/uL (ref 150–400)
RDW: 15.9 % — ABNORMAL HIGH (ref 11.5–15.5)
WBC: 8.6 10*3/uL (ref 4.0–10.5)

## 2011-12-31 LAB — GLUCOSE, CAPILLARY: Glucose-Capillary: 78 mg/dL (ref 70–99)

## 2011-12-31 LAB — BASIC METABOLIC PANEL
BUN: 28 mg/dL — ABNORMAL HIGH (ref 6–23)
Calcium: 9.1 mg/dL (ref 8.4–10.5)
Creatinine, Ser: 0.72 mg/dL (ref 0.50–1.10)
GFR calc Af Amer: 86 mL/min — ABNORMAL LOW (ref >90)
GFR calc non Af Amer: 74 mL/min — ABNORMAL LOW (ref >90)

## 2011-12-31 MED ORDER — SIMVASTATIN 20 MG PO TABS: 20.0000 mg | ORAL_TABLET | Freq: Every day | ORAL | Status: DC

## 2011-12-31 NOTE — Progress Notes (Signed)
12/31/11 Nursing 1642 DC IV, DC Tele, DC Home. Discharge instructions and home medications discussed with patient. Patient denies any questions or concerns at this time. Patient leaving unit via wheelchair and appears in no acute distress. Ernesta Amble, RN

## 2011-12-31 NOTE — Progress Notes (Signed)
I interviewed and examined this patient and discussed the care plan with Dr. Durene Cal and the [facility] team and agree with assessment and plan as documented in the progress note for today.    [name] A. Sheffield Slider, MD Family Medicine Teaching Service Attending  12/31/2011 3:13 PM

## 2011-12-31 NOTE — Progress Notes (Signed)
Clinical Child psychotherapist (CSW) observed that PT has recommended SNF and OT has recommended HH. CSW observed that pt has decided to return home with PT/OT services. CSW spoke with pt and informed pt that if she decides on placement to contact her PCP who car arrange placement. CSW is signing off.  Theresia Bough, MSW, Theresia Majors [phone]
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
## 2011-12-31 NOTE — Discharge Summary (Signed)
I interviewed and examined this patient and discussed the care plan with Dr. Durene Cal and the [facility] team and agree with assessment and plan as documented in the discharge note for today.    Rooney Swails A. Sheffield Slider, MD Family Medicine Teaching Service Attending  12/31/2011 4:36 PM

## 2011-12-31 NOTE — Progress Notes (Signed)
Met with pt who uses two canes to walk, but would like a rolling walker. HHPT and HHOT ordered from [facility], and rolling walker ordered for delivery to room.  Johny Shock  RN MPH Case Manager [phone]

## 2011-12-31 NOTE — Progress Notes (Signed)
PGY-1 Daily Progress Note Family Medicine Teaching Service [name]. Marti Sleigh, MD Service Pager: [phone]   Subjective: no chest pain or palpitations. States wants to go to SNF EVENTUALLY but must go home first to put things in order. Says this despite PT and OT recs for 24 hour supervision with 3x weekly therapy  Objective:  Temp:  [97.7 F (36.5 C)-98.1 F (36.7 C)] 97.8 F (36.6 C) (01/08 0500) Pulse Rate:  [59-89] 89  (01/08 0500) Resp:  [17-18] 18  (01/08 0500) BP: (106-140)/(58-89) 140/76 mmHg (01/08 0500) SpO2:  [97 %-100 %] 100 % (01/08 0500) Weight:  [109 lb 12.7 oz (49.803 kg)] 109 lb 12.7 oz (49.803 kg) (01/08 0500)  Intake/Output Summary (Last 24 hours) at 12/31/11 1018 Last data filed at 12/31/11 0855  Gross per 24 hour  Intake    480 ml  Output      0 ml  Net    480 ml   Gen: NAD, thin elderly female  HEENT: moist mucous membranes  CV: irregularly irregular rhythm without tachycardia, no murmurs rubs or gallops  PULM: clear to auscultation bilaterally. No wheezes/rales/rhonchi  ABD: soft/nontender/nondistended/normal bowel sounds  EXT: No edema  Neuro: Alert and oriented x3, grossly intact   Labs and imaging:   CBC  Lab 12/29/11 0500 12/28/11 1808  WBC 9.4 7.0  HGB 13.0 12.1  HCT 38.6 35.5*  PLT 316 302   BMET  Lab 12/29/11 0500 12/28/11 1808  NA 140 139  K 3.6 4.7  CL 104 108  CO2 23 23  BUN 18 21  CREATININE 0.75 0.70  LABGLOM -- --  GLUCOSE 125* --  CALCIUM 9.5 9.4       Assessment  PARA COSSEY is a 78 y.o. year old female With a history of CAD s/p MI 20 years ago, HTN, HLD, DM, history of stroke, history Carotid artery disease, tobacco abuse presenting with chest pain, shortness of breath, and palpitations worse for 2 weeks and specifically 45 minutes in duration on day of admission.   1. Chest Pain-suspect most likely related to palpitations, rapid heart rate-see problem #2. No active chest pain for over 24 hours. . DDx  includes GERD, MSK pain, anxiety. Suspect anxiety as primary culprit with palpitations.  No PTX on CXR. No PE with negative CT angiogram. EKG with irregular sinus tachycardia but p waves before every qrs. No heart block. No st/t wave changes.  *cardiac enzymes x3.  *Risk stratification-tsh wnl, a1c 6.5. Lipids with LDL 142. Would benefit from a statin.  *ASA 81mg   *patient would likely benefit from outpatient stress test.   2. Irregular heart beat/palpitations -recurrent issue at least since January of this year. Patient did not bring back holter monitor at that time. Had been on propranolol previously. Due to concern for possible COPD, did not restart at this time.  *Continue new medication metoprolol 25 BID. No palpitations for 24 hours on this with HR well controlled.  *echo without obvious structural abnormalities but did showGrade II diastolic dysfunction. EF 55-60%.   *has appointment on January 10th and may benefit from Holter Monitor   3. Grade II diastolic CHF-BNP equivocal. No signs on physical exam. Received Lasix 40 IV in ED. Wts stable since yesterday. I/os net approximately even since admission. No signs of acute failure.   4. DM-a1c 6.5. CBGS typically less than 120. No sliding scale.   5. HTN-well controlled since starting metoprolol per problem #2, blood pressure typically around 110/65 but some  systolics closer to 140.   6. HLD-LDL 142, patient would benefit from statin. On statin.   7. Essential tremor-continue primidone per home meds.  8. COPD?/Tobacco abuse- tobacco abuse counselor ordered. Will need outpatient PFTs as requested in last outpatient visit. Not currently requesting nicotine patch. Albuterol prn for wheezing per home meds.  9. Code Status: Full Code although patient wishes for it to not be prolonged. Would readdress outpatient.   10. Weakness-PT/OT recommending SNF with 24 hour supervisionbut patient desires to go home to put things in order. Will order CM to  give HHPT and HHOT. Vit d pending.   FEN/GI-carb modified. No IVF.  PPx-heparin sq  Disposition-discharge home today. Will arrange hhpt and hhot.    Tana Conch, MD PGY1, Family Medicine Teaching Service [phone]

## 2012-01-01 LAB — VITAMIN D 1,25 DIHYDROXY
Vitamin D2 1, 25 (OH)2: <8 pg/mL
Vitamin D3 1, 25 (OH)2: 77 pg/mL

## 2012-01-01 NOTE — Progress Notes (Signed)
   CARE MANAGEMENT NOTE 01/01/2012  Patient:  [name]   Account Number:  [id number]  Date Initiated:  12/30/2011  Documentation initiated by:  Shannan Harper  Subjective/Objective Assessment:   Patient admitted with chest pain and palpitations.  Hx of CVA, CAD, DM, HTN.  Initially thought new onset CHF, however with further evaluation not the case.     Action/Plan:   Lives at home alone.  She is able to cook, bathe, and clean by herself.  Walks with a cane and crutch.  Has some support from church friends, she remains a smoker.  She lives off government retirement/social security after mortgage has $400.   Anticipated DC Date:  12/31/2011   Anticipated DC Plan:  HOME W HOME HEALTH SERVICES  In-house referral  Clinical Social Worker      DC Associate Professor  CM consult      [facility] Choice  HOME HEALTH  DURABLE MEDICAL EQUIPMENT   Choice offered to / List presented to:  C-1 Patient   DME arranged  Levan Hurst      DME agency  [facility]     HH arranged  HH-2 PT  HH-3 OT      [facility] agency  [facility]   Status of service:  Completed, signed off Medicare Important Message given?  NO (If response is "NO", the following Medicare IM given date fields will be blank) Date Medicare IM given:   Date Additional Medicare IM given:    Discharge Disposition:  HOME W HOME HEALTH SERVICES  Per UR Regulation:  Reviewed for med. necessity/level of care/duration of stay  Comments:  UR Completed. 12/30/11 1222 Shannan Harper, RN, BSN

## 2012-01-02 ENCOUNTER — Ambulatory Visit (INDEPENDENT_AMBULATORY_CARE_PROVIDER_SITE_OTHER): Payer: Medicare Other | Admitting: Family Medicine

## 2012-01-02 ENCOUNTER — Encounter: Payer: Self-pay | Admitting: Family Medicine

## 2012-01-02 DIAGNOSIS — Z609 Problem related to social environment, unspecified: Secondary | ICD-10-CM

## 2012-01-02 DIAGNOSIS — R69 Illness, unspecified: Secondary | ICD-10-CM

## 2012-01-02 DIAGNOSIS — Z7409 Other reduced mobility: Secondary | ICD-10-CM

## 2012-01-02 DIAGNOSIS — Z7289 Other problems related to lifestyle: Secondary | ICD-10-CM

## 2012-01-02 NOTE — Patient Instructions (Signed)
It was nice to see you today!  Please come back to see me in 2 weeks, if possible.  We will work on getting your prescriptions delivered to your house.  We will also have home health come out to see you for a safety evaluation.   [name], M.D.

## 2012-01-04 ENCOUNTER — Encounter: Payer: Self-pay | Admitting: Family Medicine

## 2012-01-04 NOTE — Progress Notes (Signed)
Subjective:     Patient ID: [name], female   DOB: [birth year], 78 y.o.   MRN: [id number]  HPI Patient presents with representative from home health care company for a comprehensive mobility exam.  Patient is requesting a powered wheelchair to help her get around her home.  Patient states a powered wheelchair would help her get from room to room in her house.  Her biggest concern to getting to the bathroom, given her urinary incontinence.  She states it would also help her with food preparation around the kitchen, which at this time is limited due to her ability to get around.  Patient states she has a manual wheelchair which was given to her by a family member, but she cannot use it due to 10/10 pain in her shoulders bilaterally. Patient currently ambulates with 2 canes.  This is not only painful for her, but limits her activities. She has overall decreased functioning and her canes will not be an option for much longer. She also attempted to use a walker during a recent hospitalization. She was working with PT at the time, but still she was not safe using this due to her weakness (decreased endurance) and lack of balance. It was also noted that patient had difficulty with turns using rolling walker which made her gait more unsteady. Patient also is unable to use scooter due to limited range of motion of shoulders secondary to bilateral rotator cuff injuries.  She does not have the strength or endurance to keep her arms extended in order to use scooter which in my opinion would cause her increased pain.  After discussion of the use of a powered wheelchair with patient, patient states she would use the wheelchair consistently to help with her daily ADLs and to maintain her independence.   Review of Systems + dizziness, + palpitations, + urinary frequency/leaking, + shoulder pain, + knee pain - HA, -CP, - SOB, - Abd pain, - rash    Objective:   Physical Exam  Constitutional: She appears  well-developed.  HENT:  Head: Normocephalic and atraumatic.  Cardiovascular: Normal rate.  An irregular rhythm present.  Pulmonary/Chest: Effort normal and breath sounds normal. No respiratory distress.  Abdominal: Soft. There is no tenderness.  Musculoskeletal:       Right shoulder: She exhibits decreased range of motion, tenderness and decreased strength. She exhibits no swelling and no deformity.       Left shoulder: She exhibits decreased range of motion, tenderness and decreased strength. She exhibits no swelling and no deformity.       Upper extremity strength 4/5. Lower extremity 4/5.        Assessment:     78 yo F with extensive PMH presenting to office for mobility exam in order to receive power wheelchair for use at home    Plan:     In my opinion, patient is an excellent candidate for powered wheelchair. She has the hand dexterity and ability to operate the chair despite her pain, strength and range of motion limitations for which she cannot use a manual wheelchair. This will greatly assist this patient in her day-to-day life in her home and I believe will make her have higher self esteem which will overall lead to better health of the patient.  Her problems are as follows. The problems listed in bold will most likely benefit from this durable medical equipment: Patient Active Problem List  Diagnoses  . DIABETES-TYPE 2  . DEFICIENCY, VITAMIN D NOS  .  HYPERLIPIDEMIA  . TOBACCO USER  . TREMOR, ESSENTIAL, HX OF  . HYPERTENSION, ESSENTIAL NOS  . CAROTID ARTERY DISEASE  . C V A/STROKE  . ARTHROPATHY NEC, SHOULDER  . ARTHRITIS, KNEES, BILATERAL  . OSTEOPOROSIS  . FATIGUE  . ABNORMALITY OF GAIT  . Palpitations  . URINARY INCONTINENCE, URGE  . HX, PERSONAL, THROMBOPHLEBITIS  . SYNCOPE  . ALTERED MENTAL STATUS  . Bronchitis  . High risk social situations  . Chest pain  . CAD (coronary artery disease)  . Diastolic CHF, chronic   This patient was seen and evaluated  with Dr. Deirdre Priest, attending physician of [facility]. Plan was fully discussed with him.

## 2012-01-06 ENCOUNTER — Telehealth: Payer: Self-pay | Admitting: *Deleted

## 2012-01-06 NOTE — Telephone Encounter (Signed)
Pt called today asking about a med to help her with her incontinence, states that MD mentioned something about this.  Says she "cant even move around the house without an accident, let alone go to church"  Advised I would forward the message to MD. Milas Gain, Maryjo Rochester

## 2012-01-07 ENCOUNTER — Telehealth: Payer: Self-pay | Admitting: *Deleted

## 2012-01-07 DIAGNOSIS — Z7409 Other reduced mobility: Secondary | ICD-10-CM | POA: Insufficient documentation

## 2012-01-07 NOTE — Telephone Encounter (Signed)
Unable to discuss this with patient at her last visit since it was for mobility testing. She has an appointment on 01/17/12 and this will be top priority. With her recent hospitalization and dizziness, I do not feel comfortable prescribing a medication before I see her again to discuss her chronic medical problems.  Please ask patient to keep her upcoming appointment and we will discuss it then.   Thank you! Amber M. Hairford, M.D.

## 2012-01-07 NOTE — Telephone Encounter (Signed)
Received call from Camelia Phenes Therapist with [facility] that was requested to visit for Home Saef[facility]. She went out to see patient today. Needed to advise MD  that the only meds patient  has  are the vitamins, folic acid 400 mcg , echonashia  400 mg , and cranberry 4200 mg. Does not have Lopressor, Zocor, albuterol or the nebulizer , or ferrous sulfate.   Taking calcium with magnesium and zinc   Also requests referral  for nurse to visit . Will forward message to MD.

## 2012-01-07 NOTE — Assessment & Plan Note (Addendum)
[facility] to see patient for safety evaluation, as well as PT and RN given her recent hospitalization. Patient was not able to her have Rx filled from her hospital discharge since she did not have transportation to [facility]. Patient was given Rx for beta blocker, statin and Albuterol. She in interested in [employer] deliver service, which we will arrange at her next visit in 2 weeks.  If she has any chest pain, SOB, increased dizziness or overall increased fatigue, patient will call the office and/or come in for a same day visit. Otherwise, I will see her on January 17, 2012 for follow-up appointment.

## 2012-01-07 NOTE — Assessment & Plan Note (Signed)
Patient presents today for mobility assessment in order to qualify for a powered wheelchair. In my opinion through discussion with the patient, as well as her physical exam, that patient is an excellent candidate for a powered wheelchair. She has multiple medical problems that limit her mobility including gait abnormality (walks poorly with 2 canes), prior bilateral rotator cuff injuries, osteoporosis, osteoarthritis of knees bilaterally and generalized weakness. She has tried to use manual wheelchair in the past but she was unable to use these due to weakness and pain of shoulders. Patient states this wheelchair will help her most at home with getting from room to room, and especially to the restroom which seems to be a large issue for the patient. After full assessment, I am recommending patient to have a powered wheel chair.

## 2012-01-07 NOTE — Telephone Encounter (Signed)
Pt informed. Yukie Bergeron Dawn  

## 2012-01-07 NOTE — Telephone Encounter (Signed)
Notified Dr. Mikel Cella of message from PT. She was aware of this. Planned to address with patient at next visit since previous visit  for mobility evaluation only. However RX have been sent to pharmacy but patient cannot get there to pick up.  Patient will need to contact [facility] to set up an account, then RX can be sent to them for  Home delivery.    Bonita Quin will talk with patient about this. Dr. Mikel Cella advises the most important for patient now will be the Lopressor.    Gave order per Dr. Mikel Cella for  Nurse evaluation. Also PT Bonita Quin would like a Child psychotherapist evaluation because patient indicated to her that she  realizes she needs to make a transition soon to Assisted Living. Advised this will be ok also.     I had explained to Cosby , PT, that the previous visit was for mobility evaluation. She advises patient's current  home will not accommodate a power wheelchair. Will forward message to Dr. Mikel Cella.

## 2012-01-07 NOTE — Telephone Encounter (Signed)
I appreciate [name] and [facility] assistance with this patient. I was aware at the last visit that she was unable to get her prescriptions since she did not have a way to get to the pharmacy. We were not able to arrange [name] pharmacy that day, but I feel this is the best option for her if they will deliver to her house. She does not have a reliable source to get her prescriptions any other way.  I am completing mobility assessment for review prior to submission to Medicare for power wheelchair. Since patient has requested the wheelchair, and we have taken the appropriate steps to get this for her, I am going to continue with current plan despite the report that her home not might not accommodate the wheelchair. (Which is really unfortunate, in my opinion)   Please encourage patient to keep appointment with me on January 25. At that appointment, we will have more time to discuss her chronic medical problems as well social issues.  Thanks again! [name] M. Krystofer Hevener, M.D.

## 2012-01-08 NOTE — Telephone Encounter (Signed)
I called [name] and was told that patient just needs to call them with her address , phone # , etc and insurance info and tell them which pharmacy  her current meds are,  and they will call and have them transferred over to their pharmacy and will deliver.  Patient notified . She wrote down [name] phone # and will call. Bonita Quin PT notified also.

## 2012-01-09 ENCOUNTER — Telehealth: Payer: Self-pay | Admitting: Family Medicine

## 2012-01-09 NOTE — Telephone Encounter (Signed)
[name] was in on 1/10 and was told that an RX was going to be sent to her pharmacy or her home, but that hasn't happened yet so she would like a call to discuss this because she really needs that medication.

## 2012-01-09 NOTE — Telephone Encounter (Signed)
Pt unable to tell me what med she needed.  Called pharmacy, it was the primidone.  Gave verbal for one month, as pt has appt on the 25th. [name], Maryjo Rochester

## 2012-01-15 ENCOUNTER — Telehealth: Payer: Self-pay | Admitting: *Deleted

## 2012-01-15 NOTE — Telephone Encounter (Signed)
[name] wasn't to go to an assisted living facility and [name] form [facility] needs verbal [name] for a social work consult. Is this ok with you?

## 2012-01-15 NOTE — Telephone Encounter (Signed)
[name] informed, expressed understanding. 

## 2012-01-15 NOTE — Telephone Encounter (Signed)
Yes please give verbal order for social work consult.  [name] has worked with her in the past, if she needs to help I'm sure she can. But [facility] social worker will be great!   Thank you! [name] M. [name], M.D.

## 2012-01-16 NOTE — Telephone Encounter (Signed)
Has this been resolved?

## 2012-01-17 ENCOUNTER — Ambulatory Visit: Payer: Medicare Other | Admitting: Family Medicine

## 2012-01-20 ENCOUNTER — Ambulatory Visit: Payer: Medicare Other | Admitting: Family Medicine

## 2012-02-20 ENCOUNTER — Telehealth: Payer: Self-pay | Admitting: Clinical

## 2012-02-20 NOTE — Telephone Encounter (Signed)
Clinical Child psychotherapist (CSW) contacted pt to see if she has received her power wheel chair. Pt stated that as of today has not received the wheel chair and is unsure of what the status is. CSW informed pt that she would contact [facility] and follow up. Pt also stated that she does not feel well at all and would like to make an appointment with the doctor soon however does not have transportation. CSW explored whether pt can call her church friends however pt stated they either don't drive or are working. CSW explored whether pt could pay for a taxi and pt stated she could however would wait to come to the doc next week. CSW would contact a taxi if pt was an emergent need however pt stated she felt she could wait. CSW placed a referral for the [facility] as pt has very little support system, multiple medical problems and transportation challenges. CSW also contacted [employer] to inquire whether they can provide pt with assistance. CSW was informed by Mackey Birchwood at [employer] [phone] that pt can schedule her doctors appointment and contact [employer] as long as she gives them a weeks notice. CSW also informed that pt can only receive this service once a month.CSW contacted pt with this update and informed her to schedule an appointment with the doctor and call [employer] for transportation. Pt was very appreciative and stated she would follow up.   Theresia Bough, MSW, Theresia Majors [phone]

## 2012-02-24 ENCOUNTER — Telehealth: Payer: Self-pay | Admitting: Clinical

## 2012-02-24 NOTE — Telephone Encounter (Signed)
Clinical Child psychotherapist (CSW) contacted pt who stated she wasn't feel however still had not called to schedule an appointment to see her PCP. Pt stated she would schedule an appointment soon...  CSW explored pt thoughts regarding placement into a facility. Pt stated she is open to the idea as she feels she does need a higher level of care. CSW explained to pt that it would be very important for pt to go to DSS to apply for Medicaid as pt would need to either pay privately or have Medicaid for long term care. Pt stated she understood and would consider going.   CSW spoke to Katrine Coho RN at [facility] [phone] regarding pt needs. [employer] representative stated he would contact pt and assess.  Theresia Bough, MSW, Theresia Majors [phone]

## 2012-02-27 ENCOUNTER — Observation Stay (HOSPITAL_COMMUNITY)
Admission: EM | Admit: 2012-02-27 | Discharge: 2012-03-02 | Disposition: A | Discharge status: Home / Self Care | Payer: Medicare Other | Source: Ambulatory Visit | Attending: Family Medicine | Admitting: Family Medicine

## 2012-02-27 ENCOUNTER — Other Ambulatory Visit: Payer: Self-pay

## 2012-02-27 ENCOUNTER — Encounter (HOSPITAL_COMMUNITY): Payer: Self-pay | Admitting: Emergency Medicine

## 2012-02-27 ENCOUNTER — Emergency Department (HOSPITAL_COMMUNITY)
Admission: EM | Admit: 2012-02-27 | Discharge: 2012-02-27 | Disposition: A | Discharge status: Nursing Home (Includes ICF) | Payer: Medicare Other | Source: Home / Self Care | Attending: Family Medicine | Admitting: Family Medicine

## 2012-02-27 ENCOUNTER — Emergency Department (HOSPITAL_COMMUNITY): Payer: Medicare Other

## 2012-02-27 ENCOUNTER — Encounter (HOSPITAL_COMMUNITY): Payer: Self-pay

## 2012-02-27 DIAGNOSIS — R079 Chest pain, unspecified: Secondary | ICD-10-CM | POA: Insufficient documentation

## 2012-02-27 DIAGNOSIS — I779 Disorder of arteries and arterioles, unspecified: Secondary | ICD-10-CM | POA: Insufficient documentation

## 2012-02-27 DIAGNOSIS — I951 Orthostatic hypotension: Secondary | ICD-10-CM | POA: Insufficient documentation

## 2012-02-27 DIAGNOSIS — I4891 Unspecified atrial fibrillation: Secondary | ICD-10-CM

## 2012-02-27 DIAGNOSIS — F17200 Nicotine dependence, unspecified, uncomplicated: Secondary | ICD-10-CM | POA: Insufficient documentation

## 2012-02-27 DIAGNOSIS — R55 Syncope and collapse: Secondary | ICD-10-CM

## 2012-02-27 DIAGNOSIS — Z8673 Personal history of transient ischemic attack (TIA), and cerebral infarction without residual deficits: Secondary | ICD-10-CM | POA: Insufficient documentation

## 2012-02-27 DIAGNOSIS — E785 Hyperlipidemia, unspecified: Secondary | ICD-10-CM | POA: Insufficient documentation

## 2012-02-27 DIAGNOSIS — R42 Dizziness and giddiness: Secondary | ICD-10-CM

## 2012-02-27 DIAGNOSIS — Z9181 History of falling: Secondary | ICD-10-CM | POA: Insufficient documentation

## 2012-02-27 DIAGNOSIS — I2510 Atherosclerotic heart disease of native coronary artery without angina pectoris: Secondary | ICD-10-CM | POA: Insufficient documentation

## 2012-02-27 DIAGNOSIS — G252 Other specified forms of tremor: Secondary | ICD-10-CM | POA: Insufficient documentation

## 2012-02-27 DIAGNOSIS — G250 Essential tremor: Secondary | ICD-10-CM | POA: Insufficient documentation

## 2012-02-27 DIAGNOSIS — Z609 Problem related to social environment, unspecified: Secondary | ICD-10-CM | POA: Diagnosis present

## 2012-02-27 DIAGNOSIS — E119 Type 2 diabetes mellitus without complications: Principal | ICD-10-CM | POA: Insufficient documentation

## 2012-02-27 DIAGNOSIS — R0602 Shortness of breath: Secondary | ICD-10-CM | POA: Insufficient documentation

## 2012-02-27 DIAGNOSIS — R002 Palpitations: Secondary | ICD-10-CM | POA: Insufficient documentation

## 2012-02-27 DIAGNOSIS — I252 Old myocardial infarction: Secondary | ICD-10-CM | POA: Insufficient documentation

## 2012-02-27 DIAGNOSIS — I10 Essential (primary) hypertension: Secondary | ICD-10-CM | POA: Insufficient documentation

## 2012-02-27 LAB — BASIC METABOLIC PANEL
CO2: 24 mEq/L (ref 19–32)
Chloride: 105 mEq/L (ref 96–112)
Creatinine, Ser: 0.64 mg/dL (ref 0.50–1.10)
GFR calc Af Amer: 89 mL/min — ABNORMAL LOW (ref >90)
Potassium: 4.3 mEq/L (ref 3.5–5.1)
Sodium: 139 mEq/L (ref 135–145)

## 2012-02-27 LAB — CBC
MCHC: 33.5 g/dL (ref 30.0–36.0)
Platelets: 270 10*3/uL (ref 150–400)
RDW: 15.1 % (ref 11.5–15.5)
WBC: 8.3 10*3/uL (ref 4.0–10.5)

## 2012-02-27 LAB — CARDIAC PANEL(CRET KIN+CKTOT+MB+TROPI)
CK, MB: 3.3 ng/mL (ref 0.3–4.0)
Relative Index: 2 (ref 0.0–2.5)
Total CK: 163 U/L (ref 7–177)
Troponin I: <0.3 ng/mL (ref <0.30)

## 2012-02-27 LAB — DIFFERENTIAL
Basophils Absolute: 0 10*3/uL (ref 0.0–0.1)
Basophils Relative: 1 % (ref 0–1)
Lymphocytes Relative: 10 % — ABNORMAL LOW (ref 12–46)
Neutro Abs: 6.8 10*3/uL (ref 1.7–7.7)
Neutrophils Relative %: 82 % — ABNORMAL HIGH (ref 43–77)

## 2012-02-27 LAB — PROTIME-INR: Prothrombin Time: 13.2 seconds (ref 11.6–15.2)

## 2012-02-27 MED ORDER — ACETAMINOPHEN 650 MG RE SUPP: 650.0000 mg | Freq: Four times a day (QID) | RECTAL | Status: DC | PRN

## 2012-02-27 MED ORDER — ACETAMINOPHEN 325 MG PO TABS: 650.0000 mg | ORAL_TABLET | Freq: Four times a day (QID) | ORAL | Status: DC | PRN

## 2012-02-27 MED ORDER — SIMVASTATIN 20 MG PO TABS
20.0000 mg | ORAL_TABLET | Freq: Every evening | ORAL | Status: DC
  Administered 2012-02-28 – 2012-03-02 (×4): 20 mg via ORAL
  Filled 2012-02-27 (×4): qty 1

## 2012-02-27 MED ORDER — METOPROLOL TARTRATE 25 MG PO TABS
25.0000 mg | ORAL_TABLET | Freq: Two times a day (BID) | ORAL | Status: DC
  Administered 2012-02-27 – 2012-02-28 (×2): 25 mg via ORAL
  Filled 2012-02-27 (×3): qty 1

## 2012-02-27 MED ORDER — VITAMIN B-6 100 MG PO TABS
100.0000 mg | ORAL_TABLET | Freq: Every day | ORAL | Status: DC
  Administered 2012-02-28 – 2012-03-02 (×4): 100 mg via ORAL
  Filled 2012-02-27 (×4): qty 1

## 2012-02-27 MED ORDER — ASPIRIN 81 MG PO CHEW
81.0000 mg | CHEWABLE_TABLET | Freq: Every day | ORAL | Status: DC
  Administered 2012-02-28 – 2012-03-02 (×4): 81 mg via ORAL
  Filled 2012-02-27 (×4): qty 1

## 2012-02-27 MED ORDER — SODIUM CHLORIDE 0.9 % IJ SOLN
3.0000 mL | Freq: Two times a day (BID) | INTRAMUSCULAR | Status: DC
  Administered 2012-02-27 – 2012-03-02 (×7): 3 mL via INTRAVENOUS

## 2012-02-27 MED ORDER — PRIMIDONE 50 MG PO TABS
50.0000 mg | ORAL_TABLET | Freq: Every day | ORAL | Status: DC
  Administered 2012-02-27 – 2012-03-01 (×4): 50 mg via ORAL
  Filled 2012-02-27 (×5): qty 1

## 2012-02-27 MED ORDER — SODIUM CHLORIDE 0.9 % IV SOLN
INTRAVENOUS | Status: DC
  Administered 2012-02-27: 16:00:00 via INTRAVENOUS

## 2012-02-27 NOTE — ED Notes (Signed)
Pt placed on a nasal cannula 2lpm 

## 2012-02-27 NOTE — ED Notes (Signed)
Report to chris, rn

## 2012-02-27 NOTE — ED Provider Notes (Signed)
History     CSN: [id number]  Arrival date & time 02/27/12  1335   First MD Initiated Contact with Patient 02/27/12 1455      Chief Complaint  Patient presents with  . Dizziness    (Consider location/radiation/quality/duration/timing/severity/associated sxs/prior treatment) HPI Comments: The patient reports having a hx of afib and "passed out x 2 this am". Denies head injury. Dizziness began ofter eating this am and then she passed out. She believes her heart is the cause. She can feel it beating fast. She denies any chest discomfort.   The history is provided by the patient.    Past Medical History  Diagnosis Date  . CVA (cerebral vascular accident)   . CAD (coronary artery disease)   . Vitamin d deficiency   . Diabetes mellitus, type 2   . HTN (hypertension)   . HLD (hyperlipidemia)   . Tremor, essential   . Urinary incontinence   . DIABETES-TYPE 2 07/16/2007  . HYPERLIPIDEMIA 03/03/2007  . TOBACCO USER 12/25/2009  . HYPERTENSION, ESSENTIAL NOS 08/07/2007  . CAROTID ARTERY DISEASE 07/16/2007  . C V A/STROKE 07/16/2007  . Palpitations 02/08/2010  . High risk social situations 11/28/2011    Past Surgical History  Procedure Date  . Appendectomy 1965  . Vaginal hysterectomy 1965  . Knee surgery 1989, 1995    Family History  Problem Relation Age of Onset  . Diabetes Mother   . Diabetes Brother   . Stroke Father     History  Substance Use Topics  . Smoking status: Current Everyday Smoker    Types: Cigarettes  . Smokeless tobacco: Not on file   Comment: does not smoke much...  . Alcohol Use: Not on file    OB History    Grav Para Term Preterm Abortions TAB SAB Ect Mult Living                  Review of Systems  Constitutional: Negative.   HENT: Negative.   Respiratory: Negative.   Cardiovascular: Negative for chest pain.  Gastrointestinal: Negative.   Genitourinary: Negative.   Neurological: Positive for dizziness.    Allergies  Codeine and  Penicillins  Home Medications   Current Outpatient Rx  Name Route Sig Dispense Refill  . ACETAMINOPHEN ER 650 MG PO TBCR Oral Take 650 mg by mouth as needed. For arthritis pain.    [name] BLACK COHOSH 160 MG PO CAPS Oral Take 160 mg by mouth daily.     [name] CALCIUM PO Oral Take 1 tablet by mouth 2 (two) times daily.     . B-12 100 MCG PO TABS Oral Take 100 mcg by mouth daily.     [name] FERROUS SULFATE 325 (65 FE) MG PO TABS Oral Take 325 mg by mouth daily with breakfast.      . GINKGO BILOBA 120 MG PO CAPS Oral Take 120 mg by mouth daily.     [name] METOPROLOL TARTRATE 25 MG PO TABS Oral Take 1 tablet (25 mg total) by mouth 2 (two) times daily. 60 tablet 3  . PRIMIDONE 50 MG PO TABS Oral Take 50 mg by mouth at bedtime.      [name] B-6 100 MG PO TABS Oral Take 100 mg by mouth daily.     [name] SIMVASTATIN 20 MG PO TABS Oral Take 1 tablet (20 mg total) by mouth daily at 6 PM. 30 tablet 2  . ALBUTEROL SULFATE (2.5 MG/3ML) 0.083% IN NEBU Nebulization Take 2.5 mg by nebulization every 6 (  six) hours as needed. For wheezing.     . ASPIRIN 81 MG PO CHEW Oral Chew 1 tablet (81 mg total) by mouth daily. 30 tablet 5    BP 142/82  Pulse 103  Temp(Src) 98.5 F (36.9 C) (Oral)  Resp 20  SpO2 100%  Physical Exam  Constitutional: She is oriented to person, place, and time. She appears well-developed and well-nourished. No distress.  HENT:  Head: Normocephalic and atraumatic.  Eyes: Pupils are equal, round, and reactive to light.  Neck: Normal range of motion. Neck supple.  Cardiovascular:       irreg rate and rhythem  Pulmonary/Chest: Effort normal and breath sounds normal.  Musculoskeletal: Normal range of motion.  Neurological: She is alert and oriented to person, place, and time. No cranial nerve deficit.    ED Course  Procedures (including critical care time)  Labs Reviewed - No data to display No results found. ekg show sinus rhythema with frequent premature contractures   1. Dizziness   2. Atrial  fibrillation    Will transport to mced for eval   MDM          Randa Spike, MD 02/27/12 [id number]

## 2012-02-27 NOTE — ED Notes (Signed)
Pt reports feeling like she may pass out last night and again today while laying in bed.  Pt states she thought she just fell asleep but not sure.  Pt ambulates with cane and crutch.  Pt alert and oriented x4.  Pt lives at home.  Pt denies any problems at this time and reports feeling back to normal at this time.

## 2012-02-27 NOTE — ED Notes (Signed)
Pt ambulated to and from RR with RN and cane.

## 2012-02-27 NOTE — H&P (Signed)
[name] is an 78 y.o. female.    PCP: Rodman Pickle, MD, MD Chief Complaint: syncope  HPI: [name] is a 78 y.o. year old female with a history of CAD s/p MI 20 years ago, HTN, HLD, DM, history of stroke, history Carotid artery disease, tobacco abuse presenting with repeated episodes of syncope. She was scheduled to see her family practice physician today, however office was closed and she presented to urgent care who sent the patient to the emergency department. Describes repeated episodes in past 2 weeks with no clear instigating triggers, these happen at rest and also exertionally at times. Last episode was yesterday. Patient endorses feeling of lightheadedness, tunnel vision, and a feeling of inability to speak and respond. These last up to 10 minutes at a time. The patient denies falling or trauma. Patient lives alone and is anxious regarding return home. She also endorses chronic palpitations and mild associated chest pressure which additionally called anxiety, however these symptoms are not new and patient has undergone extensive workup including recent hospitalization in January. At that time, was recommended she go to skilled nursing or home health however patient currently is independent.  Family medicine with consult and for admission for syncope rule out in setting of significant cardiac risk factors.   Past Medical History  Diagnosis Date  . CVA (cerebral vascular accident)   . CAD (coronary artery disease)   . Vitamin d deficiency   . Diabetes mellitus, type 2   . HTN (hypertension)   . HLD (hyperlipidemia)   . Tremor, essential   . Urinary incontinence   . DIABETES-TYPE 2 07/16/2007  . HYPERLIPIDEMIA 03/03/2007  . TOBACCO USER 12/25/2009  . HYPERTENSION, ESSENTIAL NOS 08/07/2007  . CAROTID ARTERY DISEASE 07/16/2007  . C V A/STROKE 07/16/2007  . Palpitations 02/08/2010  . High risk social situations 11/28/2011    Past Surgical History  Procedure Date  .  Appendectomy 1965  . Vaginal hysterectomy 1965  . Knee surgery 1989, 1995    Family History  Problem Relation Age of Onset  . Diabetes Mother   . Diabetes Brother   . Stroke Father    Social History:  reports that she has been smoking Cigarettes.  She does not have any smokeless tobacco history on file. Her alcohol and drug histories not on file.  Allergies:  Allergies  Allergen Reactions  . Codeine     Unsure of reaction  . Penicillins     Unsure of reaction     Medications Prior to Admission  Medication Dose Route Frequency Provider Last Rate Last Dose  . DISCONTD: 0.9 %  sodium chloride infusion   Intravenous Continuous Randa Spike, MD 10 mL/hr at 02/27/12 1600     Medications Prior to Admission  Medication Sig Dispense Refill  . acetaminophen (TYLENOL ARTHRITIS PAIN) 650 MG CR tablet Take 650 mg by mouth as needed. For arthritis pain.      . Black Cohosh 160 MG CAPS Take 160 mg by mouth daily.       [name] CALCIUM PO Take 1 tablet by mouth 2 (two) times daily.       . Cyanocobalamin (B-12) 100 MCG TABS Take 100 mcg by mouth daily.       . ferrous sulfate 325 (65 FE) MG tablet Take 325 mg by mouth daily with breakfast.        . Ginkgo Biloba 120 MG CAPS Take 120 mg by mouth daily.       [name]  primidone (MYSOLINE) 50 MG tablet Take 50 mg by mouth at bedtime.        . Pyridoxine HCl (B-6) 100 MG TABS Take 100 mg by mouth daily.         Results for orders placed during the hospital encounter of 02/27/12 (from the past 48 hour(s))  CBC     Status: Normal   Collection Time   02/27/12  5:59 PM      Component Value Range Comment   WBC 8.3  4.0 - 10.5 (K/uL)    RBC 4.50  3.87 - 5.11 (MIL/uL)    Hemoglobin 13.1  12.0 - 15.0 (g/dL)    HCT 29.5  28.4 - 13.2 (%)    MCV 86.9  78.0 - 100.0 (fL)    MCH 29.1  26.0 - 34.0 (pg)    MCHC 33.5  30.0 - 36.0 (g/dL)    RDW 44.0  10.2 - 72.5 (%)    Platelets 270  150 - 400 (K/uL)   DIFFERENTIAL     Status: Abnormal   Collection Time    02/27/12  5:59 PM      Component Value Range Comment   Neutrophils Relative 82 (*) 43 - 77 (%)    Neutro Abs 6.8  1.7 - 7.7 (K/uL)    Lymphocytes Relative 10 (*) 12 - 46 (%)    Lymphs Abs 0.8  0.7 - 4.0 (K/uL)    Monocytes Relative 7  3 - 12 (%)    Monocytes Absolute 0.6  0.1 - 1.0 (K/uL)    Eosinophils Relative 0  0 - 5 (%)    Eosinophils Absolute 0.0  0.0 - 0.7 (K/uL)    Basophils Relative 1  0 - 1 (%)    Basophils Absolute 0.0  0.0 - 0.1 (K/uL)   BASIC METABOLIC PANEL     Status: Abnormal   Collection Time   02/27/12  5:59 PM      Component Value Range Comment   Sodium 139  135 - 145 (mEq/L)    Potassium 4.3  3.5 - 5.1 (mEq/L)    Chloride 105  96 - 112 (mEq/L)    CO2 24  19 - 32 (mEq/L)    Glucose, Bld 108 (*) 70 - 99 (mg/dL)    BUN 15  6 - 23 (mg/dL)    Creatinine, Ser 3.66  0.50 - 1.10 (mg/dL)    Calcium 9.1  8.4 - 10.5 (mg/dL)    GFR calc non Af Amer 77 (*) >90 (mL/min)    GFR calc Af Amer 89 (*) >90 (mL/min)   PROTIME-INR     Status: Normal   Collection Time   02/27/12  5:59 PM      Component Value Range Comment   Prothrombin Time 13.2  11.6 - 15.2 (seconds)    INR 0.98  0.00 - 1.49    APTT     Status: Normal   Collection Time   02/27/12  5:59 PM      Component Value Range Comment   aPTT 26  24 - 37 (seconds)   CARDIAC PANEL(CRET KIN+CKTOT+MB+TROPI)     Status: Normal   Collection Time   02/27/12  6:00 PM      Component Value Range Comment   Total CK 163  7 - 177 (U/L)    CK, MB 3.3  0.3 - 4.0 (ng/mL)    Troponin I <0.30  <0.30 (ng/mL)    Relative Index 2.0  0.0 - 2.5  Dg Chest Port 1 View  02/27/2012  *RADIOLOGY REPORT*  Clinical Data: Chest pain.  Syncope.  PORTABLE CHEST - 1 VIEW  Comparison: Radiographs and CT 12/28/2011.  Findings: 1750 hours.  The heart size and mediastinal contours are stable.  There is aortic atherosclerosis and a moderate thoracic scoliosis.  There is stable chronic lung disease with increased markings in the left upper lobe and at the right  lung base.  No definite superimposed airspace disease, edema or significant pleural effusion is identified.  There are glenohumeral degenerative changes with obliteration of the subacromial space bilaterally consistent with chronic rotator cuff tears.  IMPRESSION: Stable examination with stable chronic lung disease as described. No acute process identified.  Original Report Authenticated By: Gerrianne Scale, M.D.   EKG: NSR with PVCs, 88bpm   Review of Systems  Constitutional: Negative for fever, chills, malaise/fatigue and diaphoresis.  Eyes: Negative for blurred vision, double vision and photophobia.  Respiratory: Positive for shortness of breath.   Cardiovascular: Positive for chest pain and palpitations.  Gastrointestinal: Negative for abdominal pain.  Genitourinary: Negative for dysuria.  Musculoskeletal: Negative for myalgias.  Neurological: Negative for sensory change, focal weakness and headaches.    Blood pressure 177/94, pulse 76, temperature 98.4 F (36.9 C), temperature source Oral, resp. rate 19, SpO2 100.00%. Physical Exam  Vitals reviewed. Constitutional: She is oriented to person, place, and time. She appears well-developed and well-nourished. No distress.  HENT:  Head: Normocephalic and atraumatic.  Mouth/Throat: Oropharynx is clear and moist. No oropharyngeal exudate.  Eyes: EOM are normal. Pupils are equal, round, and reactive to light.  Cardiovascular: Normal rate.        Irregularly irregular, cardiac monitor reading atrial fibrillation intermittently, but PVCs noted. soft systolic murmur  Respiratory: Effort normal and breath sounds normal. No respiratory distress. She has no wheezes. She has no rales.  GI: Soft. She exhibits no distension. There is no tenderness. There is no rebound.  Musculoskeletal: She exhibits no edema and no tenderness.  Neurological: She is alert and oriented to person, place, and time. No cranial nerve deficit. She exhibits normal muscle  tone. Coordination normal.  Skin: No rash noted. She is not diaphoretic.  Psychiatric: She has a normal mood and affect.     Assessment/Plan [name] is a 78 y.o. year old female With a history of CAD, HTN, HLD, DM, history of stroke, history Carotid artery disease, tobacco abuse presenting with episodic syncopal episodes.   1. Presyncope. Atypical presentation with episodes lasting 10 minutes and patient is partially aware of surroundings. Possibly this represents age-related orthostasis versus medication effect from primidone. She did endorse symptoms during my interview and found cardiac monitor to every in the 90s with irregular beat showing PVCs. Given cardiac risk factors and absence of neurologic findings on exam, will defer neurologic imaging or workup. Noted patient does have history of carotid disease and potentially could have bilateral stenosis, however she would not likely be a candidate for intervention. Will observe patient overnight on telemetry in case this is an alternative arrhythmia and noted patient did not follow up for Holter monitoring previously. Will continue cardiac rule out with enzymes check BNP and continue home dose metoprolol as her blood pressure is somewhat elevated and rate has room for improvement.   2. History CVA. Continue daily aspirin, statin.  3. Diabetes. No oral medications noted. Will initiate sliding scale as needed.  4. Hypertension. Systolic variable between 140 and 170. Will not attempt more aggressive control  given her syncope. Continue home dose metoprolol 25 twice a day as patient seems unsure with her medication compliance.  5. FEN. Regular diet and saline lock PIV. Check magnesium level and electrolytes.   6. Essential tremor. Patient on primidone therapy will continue for now.  7. PPX. SCDs  8. Dispo. Admit to observation unit/telemetry. Recent conversations indicate pushed towards skilled nursing or home health. Patient seems  anxious about returning home but is noncommital regarding placement. Will consult PT/OT/SW as Nelva Bush knows patient well.   Glennis Borger PGY-2 02/27/2012, 9:12 PM Pager [phone]

## 2012-02-27 NOTE — ED Notes (Signed)
Provided pt with meal tray per MD ok.

## 2012-02-27 NOTE — ED Notes (Signed)
Report to [facility] staff.

## 2012-02-27 NOTE — ED Notes (Signed)
[id number] Ready 

## 2012-02-27 NOTE — ED Notes (Signed)
Notified [facility] 

## 2012-02-27 NOTE — ED Notes (Addendum)
Pt complained of a little dizziness upon standing. She states that this dizziness was not nearly as bad as it was this morning at 8am

## 2012-02-27 NOTE — ED Notes (Signed)
Pt here for c/o dizziness that began this morning after breakfast. Pt states she "blacked out" for one hour after symptoms first began. Pt lives alone and woke up after this episode. Pt believes her heart is causing the dizziness. States that heart is presently beating fast and feels dizzy and lightheaded. HR 72 bpm, bounding pulse.  Hx of MI in 12/2011.

## 2012-02-27 NOTE — ED Provider Notes (Signed)
History     CSN: [id number]  Arrival date & time 02/27/12  1655   First MD Initiated Contact with Patient 02/27/12 1656      Chief Complaint  Patient presents with  . Near Syncope    Per care link, pt was laying in bed this morning and fainted.  Pt went to UC then here.  Pt had EKG at UC.    (Consider location/radiation/quality/duration/timing/severity/associated sxs/prior treatment) HPI Comments: The patient is an 78 year old female with history of atrial fibrillation, CVA, CAD, diabetes, hypertension, hyperlipidemia, carotid artery disease who is sent to the emergency department from the urgent care Center where she initially presented for evaluation of multiple episodes of syncope and palpitations. She reports that these symptoms have been going on for approximately 2 weeks, and today she reports syncope x2 while trying to hang up close. Otherwise she reports persistent palpitations and occasional chest pressure, retrosternal, moderate in severity, nonradiating, lasting 5-10 minutes at a time, paroxysmal, coming at rest or during exertion. She does have a history of myocardial infarction in the past. She denies any nausea, vomiting, abdominal pain, or significant shortness of breath.  The history is provided by the patient and medical records.    Past Medical History  Diagnosis Date  . CVA (cerebral vascular accident)   . CAD (coronary artery disease)   . Vitamin d deficiency   . Diabetes mellitus, type 2   . HTN (hypertension)   . HLD (hyperlipidemia)   . Tremor, essential   . Urinary incontinence   . DIABETES-TYPE 2 07/16/2007  . HYPERLIPIDEMIA 03/03/2007  . TOBACCO USER 12/25/2009  . HYPERTENSION, ESSENTIAL NOS 08/07/2007  . CAROTID ARTERY DISEASE 07/16/2007  . C V A/STROKE 07/16/2007  . Palpitations 02/08/2010  . High risk social situations 11/28/2011    Past Surgical History  Procedure Date  . Appendectomy 1965  . Vaginal hysterectomy 1965  . Knee surgery 1989, 1995     Family History  Problem Relation Age of Onset  . Diabetes Mother   . Diabetes Brother   . Stroke Father     History  Substance Use Topics  . Smoking status: Current Everyday Smoker    Types: Cigarettes  . Smokeless tobacco: Not on file   Comment: does not smoke much...  . Alcohol Use: Not on file    OB History    Grav Para Term Preterm Abortions TAB SAB Ect Mult Living                  Review of Systems  Constitutional: Positive for fatigue. Negative for fever, chills and diaphoresis.  HENT: Negative.   Eyes: Negative for visual disturbance.  Respiratory: Negative for cough, shortness of breath and wheezing.   Cardiovascular: Positive for chest pain and palpitations. Negative for leg swelling.  Gastrointestinal: Negative.   Genitourinary: Negative.   Musculoskeletal: Negative.   Skin: Negative.   Neurological: Positive for dizziness, syncope and light-headedness. Negative for facial asymmetry, speech difficulty, weakness, numbness and headaches.  Hematological: Does not bruise/bleed easily.  Psychiatric/Behavioral: Negative.     Allergies  Codeine and Penicillins  Home Medications   Current Outpatient Rx  Name Route Sig Dispense Refill  . ACETAMINOPHEN ER 650 MG PO TBCR Oral Take 650 mg by mouth as needed. For arthritis pain.    . ASPIRIN 81 MG PO CHEW Oral Chew 81 mg by mouth daily.    [name] BLACK COHOSH 160 MG PO CAPS Oral Take 160 mg by mouth daily.     [name]  CALCIUM PO Oral Take 1 tablet by mouth 2 (two) times daily.     . B-12 100 MCG PO TABS Oral Take 100 mcg by mouth daily.     [name] FERROUS SULFATE 325 (65 FE) MG PO TABS Oral Take 325 mg by mouth daily with breakfast.      . GINKGO BILOBA 120 MG PO CAPS Oral Take 120 mg by mouth daily.     [name] METOPROLOL TARTRATE 25 MG PO TABS Oral Take 25 mg by mouth 2 (two) times daily.    [name] PRIMIDONE 50 MG PO TABS Oral Take 50 mg by mouth at bedtime.      [name] B-6 100 MG PO TABS Oral Take 100 mg by mouth daily.     [name] SIMVASTATIN  20 MG PO TABS Oral Take 20 mg by mouth every evening.      BP 165/98  Pulse 104  Temp(Src) 98.4 F (36.9 C) (Oral)  Resp 16  SpO2 100%  Physical Exam  Nursing note and vitals reviewed. Constitutional: She is oriented to person, place, and time. She appears well-developed and well-nourished. No distress.  HENT:  Head: Normocephalic and atraumatic.  Mouth/Throat: Oropharynx is clear and moist.  Eyes: EOM are normal. Pupils are equal, round, and reactive to light.  Neck: Normal range of motion. Neck supple. No JVD present. No tracheal deviation present.  Cardiovascular: Normal rate, normal heart sounds and intact distal pulses.  An irregular rhythm present.  No extrasystoles are present. Exam reveals no gallop, no S3, no S4 and no friction rub.   No murmur heard. Pulmonary/Chest: Breath sounds normal. No accessory muscle usage or stridor. Not tachypneic. No respiratory distress. She has no wheezes. She has no rales. She exhibits no tenderness.  Abdominal: Soft. Bowel sounds are normal. She exhibits no distension. There is no tenderness.  Musculoskeletal: Normal range of motion. She exhibits no edema and no tenderness.  Neurological: She is alert and oriented to person, place, and time. She has normal reflexes. No cranial nerve deficit. Coordination normal.  Skin: Skin is warm and dry. No rash noted. She is not diaphoretic. No erythema. No pallor.  Psychiatric: She has a normal mood and affect. Her behavior is normal. Judgment and thought content normal.    ED Course  Procedures (including critical care time)   Date: 02/27/2012  Rate: 88  Rhythm: normal sinus rhythm and premature ventricular contractions (PVC)  QRS Axis: normal  Intervals: normal  ST/T Wave abnormalities: normal  Conduction Disutrbances:none  Narrative Interpretation:   Old EKG Reviewed: No significant changes     Labs Reviewed  CBC  DIFFERENTIAL  BASIC METABOLIC PANEL  CARDIAC PANEL(CRET  KIN+CKTOT+MB+TROPI)  PROTIME-INR  APTT   No results found.   No diagnosis found.    MDM  The patient is 78 years old with a history of multiple cardiac problems and cardiac disease risk factors has been having multiple episodes of syncope with persistent palpitations over the last 2 weeks and intermittent chest pain. Differential diagnosis includes myocardial infarction, unstable angina, arrhythmia, arrhythmogenic syncope, orthostatic syncope, anemia, electrolyte abnormality among other potential etiologies. The patient is high risk for cardiac disease and cardiac cause of syncope and will need admission for further evaluation of this problem.        Felisa Bonier, MD 02/27/12 [phone]

## 2012-02-27 NOTE — ED Notes (Signed)
Pt to RR with NA in W/C

## 2012-02-28 ENCOUNTER — Other Ambulatory Visit: Payer: Self-pay

## 2012-02-28 DIAGNOSIS — R55 Syncope and collapse: Secondary | ICD-10-CM | POA: Diagnosis present

## 2012-02-28 HISTORY — DX: Syncope and collapse: R55

## 2012-02-28 LAB — CBC
HCT: 36.4 % (ref 36.0–46.0)
MCHC: 33.5 g/dL (ref 30.0–36.0)
MCV: 86.5 fL (ref 78.0–100.0)
RDW: 15 % (ref 11.5–15.5)

## 2012-02-28 LAB — TSH: TSH: 0.772 u[IU]/mL (ref 0.350–4.500)

## 2012-02-28 LAB — BASIC METABOLIC PANEL
BUN: 16 mg/dL (ref 6–23)
Creatinine, Ser: 0.68 mg/dL (ref 0.50–1.10)
GFR calc Af Amer: 87 mL/min — ABNORMAL LOW (ref >90)
GFR calc non Af Amer: 75 mL/min — ABNORMAL LOW (ref >90)

## 2012-02-28 LAB — GLUCOSE, CAPILLARY

## 2012-02-28 MED ORDER — METOPROLOL TARTRATE 12.5 MG HALF TABLET
12.5000 mg | ORAL_TABLET | Freq: Two times a day (BID) | ORAL | Status: DC
  Administered 2012-02-28 – 2012-03-02 (×6): 12.5 mg via ORAL
  Filled 2012-02-28 (×7): qty 1

## 2012-02-28 MED ORDER — LISINOPRIL 5 MG PO TABS
5.0000 mg | ORAL_TABLET | Freq: Every day | ORAL | Status: DC
  Administered 2012-02-28 – 2012-03-02 (×4): 5 mg via ORAL
  Filled 2012-02-28 (×4): qty 1

## 2012-02-28 NOTE — Progress Notes (Signed)
PGY-1 Daily Progress Note Family Medicine Teaching Service D. Piloto Rolene Arbour, MD Service Pager: [phone]  Patient name: [name]  Medical record [id number] Date of birth:[birth year] Age: 78 y.o. Gender: female  LOS: 1 day   Subjective: No episodes reported yesterday. No events overnight.  Objective: Vitals: Temp:  [98.2 F (36.8 C)-98.6 F (37 C)] 98.2 F (36.8 C) (03/08 0458) Pulse Rate:  [74-138] 74  (03/08 0938) Resp:  [12-25] 20  (03/08 0458) BP: (131-183)/(64-117) 131/70 mmHg (03/08 0938) SpO2:  [94 %-100 %] 100 % (03/08 0458) Weight:  [106 lb 0.7 oz (48.1 kg)-106 lb 7.7 oz (48.3 kg)] 106 lb 0.7 oz (48.1 kg) (03/08 0458)  Intake/Output Summary (Last 24 hours) at 02/28/12 1028 Last data filed at 02/28/12 0816  Gross per 24 hour  Intake    300 ml  Output      0 ml  Net    300 ml   Physical Exam: Constitutional: She is oriented to person, place, and time.No distress.  HENT:  Mouth/Throat: Oropharynx is clear and moist.   Eyes: EOM are normal. Pupils are equal, round, and reactive to light.  Cardiovascular: Normal rate.  Irregularly irregular, cardiac monitor reading atrial fibrillation intermittently, but PVCs, PACs noted. soft systolic murmur  Respiratory: Effort normal and breath sounds normal. No respiratory distress. no wheezes nor rales.  GI: Soft. She exhibits no distension. No tenderness, no rebound.  Musculoskeletal: no edema.  Neurological: She is alert and oriented to person, place, and time. No cranial nerve deficit. normal muscle tone. Coordination normal. Normal gait. Psychiatric: She has a normal mood and affect.   Labs and imaging:  CBC  Lab 02/27/12 2329 02/27/12 1759  WBC 7.3 8.3  HGB 12.2 13.1  HCT 36.4 39.1  PLT 257 270   BMET  Lab 02/28/12 0605 02/27/12 1759  NA 143 139  K 4.1 4.3  CL 110 105  CO2 25 24  BUN 16 15  CREATININE 0.68 0.64  LABGLOM -- --  GLUCOSE 102* --  CALCIUM 9.2 9.1   Results for orders placed  during the hospital encounter of 02/27/12 (from the past 24 hour(s))  PROTIME-INR     Status: Normal   Collection Time   02/27/12  5:59 PM      Component Value Range   Prothrombin Time 13.2  11.6 - 15.2 (seconds)   INR 0.98  0.00 - 1.49   APTT     Status: Normal   Collection Time   02/27/12  5:59 PM      Component Value Range   aPTT 26  24 - 37 (seconds)  CARDIAC PANEL(CRET KIN+CKTOT+MB+TROPI)     Status: Normal   Collection Time   02/27/12  6:00 PM      Component Value Range   Total CK 163  7 - 177 (U/L)   CK, MB 3.3  0.3 - 4.0 (ng/mL)   Troponin I <0.30  <0.30 (ng/mL)   Relative Index 2.0  0.0 - 2.5   GLUCOSE, CAPILLARY     Status: Abnormal   Collection Time   02/27/12 11:26 PM      Component Value Range   Glucose-Capillary 137 (*) 70 - 99 (mg/dL)   Comment 1 Notify RN    MAGNESIUM     Status: Normal   Collection Time   02/28/12  6:05 AM      Component Value Range   Magnesium 2.2  1.5 - 2.5 (mg/dL)  CARDIAC PANEL(CRET KIN+CKTOT+MB+TROPI)  Status: Normal   Collection Time   02/28/12  6:05 AM      Component Value Range   Total CK 131  7 - 177 (U/L)   CK, MB 2.5  0.3 - 4.0 (ng/mL)   Troponin I <0.30  <0.30 (ng/mL)   Relative Index 1.9  0.0 - 2.5   PRO B NATRIURETIC PEPTIDE     Status: Abnormal   Collection Time   02/28/12  6:05 AM      Component Value Range   Pro B Natriuretic peptide (BNP) 553.8 (*) 0 - 450 (pg/mL)   Dg Chest Port 1 View 02/27/2012   IMPRESSION: Stable examination with stable chronic lung disease as described. No acute process identified.  Original Report Authenticated By: Gerrianne Scale, M.D.   Medications: Scheduled Meds:   . aspirin  81 mg Oral Daily  . lisinopril  5 mg Oral Daily  . metoprolol tartrate  12.5 mg Oral BID  . primidone  50 mg Oral QHS  . pyridOXINE  100 mg Oral Daily  . simvastatin  20 mg Oral QPM  . sodium chloride  3 mL Intravenous Q12H   Continuous Infusions:  PRN Meds:.acetaminophen, acetaminophen  Assessment and  Plan: [name] is a 78 y.o. year old female With a history of CAD, HTN, HLD, DM, history of stroke, history Carotid artery disease, tobacco abuse presenting with episodic syncopal episodes.  1. Presyncope ?[name] Atypical presentation with episodes lasting 10 minutes and patient is partially aware of surroundings. Possibly this represents age-related orthostasis versus medication effect from primidone versus sleep-wake disorders. No events overnight. Patient mentions that this only happens when she is sitting up. No events when she is laying down.  Cardiac monitoring with only PVCs and PACs. Rate controlled. Patient with history of carotid stenosis, will recheck dopplers if this episodes are still a concern for pt. 2. History CVA. Continue daily aspirin, statin.  3. Diabetes. No oral medications noted. On sliding-scale insulin  4. Hypertension. Systolic variable between 140 and 170. Will not attempt more aggressive control given her syncope. - Metoprolol changed from 25 twice a day to 12.5 twice a day . Lisinopril 5 mg daily added . 5.Essential tremor. Patient on primidone therapy will continue for now.    FEN.heart healthy t and saline lock PIV.   PPX. SCDs   Dispo. Pending worker PT and OT evaluation.   D. Piloto Rolene Arbour, MD PGY1, [facility] Medicine Teaching Service Pager [phone] 02/28/2012

## 2012-02-28 NOTE — Evaluation (Signed)
Physical Therapy Evaluation Patient Details Name: [name] MRN: [id number] DOB: 12-16-1920 Today's Date: 02/28/2012  Problem List:  Patient Active Problem List  Diagnoses  . DIABETES-TYPE 2  . DEFICIENCY, VITAMIN D NOS  . HYPERLIPIDEMIA  . TOBACCO USER  . TREMOR, ESSENTIAL, HX OF  . HYPERTENSION, ESSENTIAL NOS  . CAROTID ARTERY DISEASE  . C V A/STROKE  . ARTHROPATHY NEC, SHOULDER  . ARTHRITIS, KNEES, BILATERAL  . OSTEOPOROSIS  . FATIGUE  . ABNORMALITY OF GAIT  . Palpitations  . URINARY INCONTINENCE, URGE  . HX, PERSONAL, THROMBOPHLEBITIS  . SYNCOPE  . ALTERED MENTAL STATUS  . Bronchitis  . High risk social situations  . Chest pain  . CAD (coronary artery disease)  . Diastolic CHF, chronic  . Mobility impaired  . Syncope, near    Past Medical History:  Past Medical History  Diagnosis Date  . CVA (cerebral vascular accident)   . CAD (coronary artery disease)   . Vitamin d deficiency   . Diabetes mellitus, type 2   . HTN (hypertension)   . HLD (hyperlipidemia)   . Tremor, essential   . Urinary incontinence   . DIABETES-TYPE 2 07/16/2007  . HYPERLIPIDEMIA 03/03/2007  . TOBACCO USER 12/25/2009  . HYPERTENSION, ESSENTIAL NOS 08/07/2007  . CAROTID ARTERY DISEASE 07/16/2007  . C V A/STROKE 07/16/2007  . Palpitations 02/08/2010  . High risk social situations 11/28/2011   Past Surgical History:  Past Surgical History  Procedure Date  . Appendectomy 1965  . Vaginal hysterectomy 1965  . Knee surgery 1989, 1995    PT Assessment/Plan/Recommendation PT Assessment Clinical Impression Statement: Patient presents with multiple episodes of recent syncope. She presents with deficits that are mostly pre-morbid. I would recommend acute care PT to ensure that patient is modified independent with all mobility to ensure safety at home with mobility. My main concern is these recurrent syncopal type episodes leave her at significant risk for fall  and if she was to return home  before transitioning to a SNF she would require 24 hour care. PT Recommendation/Assessment: Patient will need skilled PT in the acute care venue PT Problem List: Decreased strength;Decreased range of motion;Decreased activity tolerance;Decreased balance;Decreased mobility PT Therapy Diagnosis : Difficulty walking;Generalized weakness PT Plan PT Frequency: Min 3X/week PT Treatment/Interventions: DME instruction;Gait training;Stair training;Functional mobility training;Therapeutic activities;Therapeutic exercise;Balance training;Patient/family education PT Recommendation Follow Up Recommendations: Skilled nursing facility (Could D/C home with 24 hour care) Equipment Recommended: Rolling walker with 5" wheels PT Goals  Acute Rehab PT Goals PT Goal Formulation: With patient Time For Goal Achievement: 7 days Pt will go Supine/Side to Sit: with modified independence PT Goal: Supine/Side to Sit - Progress: Goal set today Pt will go Sit to Supine/Side: with modified independence PT Goal: Sit to Supine/Side - Progress: Goal set today Pt will go Sit to Stand: with modified independence PT Goal: Sit to Stand - Progress: Goal set today Pt will go Stand to Sit: with modified independence PT Goal: Stand to Sit - Progress: Goal set today Pt will Ambulate: >150 feet;with modified independence;with least restrictive assistive device PT Goal: Ambulate - Progress: Goal set today Pt will Go Up / Down Stairs: 1-2 stairs;with supervision;with least restrictive assistive device PT Goal: Up/Down Stairs - Progress: Goal set today Additional Goals Additional Goal #1: Improve speed and efficiency as demonstrated by a 6 second decrease in TUG test (39.2)  PT Evaluation Precautions/Restrictions  Precautions Precautions: Fall Required Braces or Orthoses: No (but patient wears bilateral knee braces secondary  to OA) Restrictions Weight Bearing Restrictions: No Prior Functioning  Home Living Lives With:  Alone Receives Help From: Friend(s) Type of Home: House Home Layout: One level Home Access: Stairs to enter Entrance Stairs-Rails: Right Entrance Stairs-Number of Steps: 3 Bathroom Shower/Tub: Associate Professor:  (Not with wheelchair) Home Adaptive Equipment: Bedside commode/3-in-1;Shower chair with back;Straight cane;Wheelchair - manual Prior Function - Uses 2 canes in home and one cane and one elbow crutch for outdoors - cane in room too tall.  Level of Independence: Needs assistance with homemaking;Independent with basic ADLs;Independent with transfers;Requires assistive device for independence;Independent with gait (Patient buys mostly processed/microwave meals) Homemaking Assistance Comments: has church friend who takes her to get groceries ~once a month and organization who brings various groceries  Able to Take Stairs?: Reciprically (with difficulty) Driving: No Vocation: Retired Producer, television/film/video: Awake/alert Overall Cognitive Status: Appears within functional limits for tasks assessed Sensation/Coordination Sensation Light Touch: Appears Intact Additional Comments: Patient states intermittent finger tingling and so can no longer play her piano otherwise nothing abnormal reported Coordination Gross Motor Movements are Fluid and Coordinated: Yes Fine Motor Movements are Fluid and Coordinated: Yes Extremity Assessment RUE Strength RUE Overall Strength Comments: 2+/5. pt with < half shoulder flexion. premorbid rotator cuff injury LUE Assessment LUE Assessment:  (Grossly 4/5) RLE Assessment RLE Assessment: Exceptions to [facility] RLE AROM (degrees) Overall AROM Right Lower Extremity: Within functional limits for tasks assessed RLE Strength RLE Overall Strength: Deficits RLE Overall Strength Comments: Grossly 4/5 hip and knee LLE Assessment LLE Assessment: Exceptions to WFL LLE AROM (degrees) Overall AROM Left  Lower Extremity: Deficits;Due to premorbid status LLE Overall AROM Comments: Hip external rotation limited leading to limitations with crossing leg so foot resting on right knee LLE Strength LLE Overall Strength: Deficits;Due to premorbid status LLE Overall Strength Comments: Grossly 4/5 at hip and knee Mobility (including Balance) Bed Mobility Supine to Sit: 4: Min assist;HOB elevated (Comment degrees) (30 degrees) Transfers Sit to Stand: 5: Supervision;From chair/3-in-1;With upper extremity assist Sit to Stand Details (indicate cue type and reason): Increased time to reach full stand Stand to Sit: With upper extremity assist;To chair/3-in-1;5: Supervision Stand to Sit Details: Good control of descent with increased time. Ambulation/Gait Ambulation/Gait: Yes Ambulation/Gait Assistance: 4: Min assist Ambulation/Gait Assistance Details (indicate cue type and reason): Min-guard assistance. Premorbid deficits secondary to postural limitations Ambulation Distance (Feet): 120 Feet Assistive device: Straight cane Gait Pattern: Step-to pattern;Decreased stride length (Waddling. wide base secondary to left genu valgus - severe)  Posture/Postural Control Posture/Postural Control: Postural limitations Postural Limitations: Severe genu valgus and left foot pronated with stance Timed Up and Go Test TUG: Normal TUG Normal TUG (seconds): 45.2  End of Session PT - End of Session Equipment Utilized During Treatment: Gait belt Activity Tolerance: Patient tolerated treatment well Patient left: in chair;with call bell in reach Nurse Communication: Mobility status for ambulation;Mobility status for transfers General Behavior During Session: [facility] for tasks performed Cognition: [facility] for tasks performed  Edwyna Perfect, PT  Pager [phone]  02/28/2012, 11:33 AM

## 2012-02-28 NOTE — Evaluation (Signed)
Occupational Therapy Evaluation Patient Details Name: [name] MRN: [id number] DOB: 1920-05-12 Today's Date: 02/28/2012  Problem List:  Patient Active Problem List  Diagnoses  . DIABETES-TYPE 2  . DEFICIENCY, VITAMIN D NOS  . HYPERLIPIDEMIA  . TOBACCO USER  . TREMOR, ESSENTIAL, HX OF  . HYPERTENSION, ESSENTIAL NOS  . CAROTID ARTERY DISEASE  . C V A/STROKE  . ARTHROPATHY NEC, SHOULDER  . ARTHRITIS, KNEES, BILATERAL  . OSTEOPOROSIS  . FATIGUE  . ABNORMALITY OF GAIT  . Palpitations  . URINARY INCONTINENCE, URGE  . HX, PERSONAL, THROMBOPHLEBITIS  . SYNCOPE  . ALTERED MENTAL STATUS  . Bronchitis  . High risk social situations  . Chest pain  . CAD (coronary artery disease)  . Diastolic CHF, chronic  . Mobility impaired  . Syncope, near    Past Medical History:  Past Medical History  Diagnosis Date  . CVA (cerebral vascular accident)   . CAD (coronary artery disease)   . Vitamin d deficiency   . Diabetes mellitus, type 2   . HTN (hypertension)   . HLD (hyperlipidemia)   . Tremor, essential   . Urinary incontinence   . DIABETES-TYPE 2 07/16/2007  . HYPERLIPIDEMIA 03/03/2007  . TOBACCO USER 12/25/2009  . HYPERTENSION, ESSENTIAL NOS 08/07/2007  . CAROTID ARTERY DISEASE 07/16/2007  . C V A/STROKE 07/16/2007  . Palpitations 02/08/2010  . High risk social situations 11/28/2011   Past Surgical History:  Past Surgical History  Procedure Date  . Appendectomy 1965  . Vaginal hysterectomy 1965  . Knee surgery 1989, 1995    OT Assessment/Plan/Recommendation OT Assessment Clinical Impression Statement: Pt. admitted with syncopal episodes. Pt presents with decrased independence with ADL and ADL mobility. Pt also states she realizes she is no longer safe to remain at home and is ready to speak with the necessary parties regarding transitioning into a NH. Will benefit from skilled OT in the acute setting to maximize I with ADL and ADL mobility to Mod I-S level upon d/c.  OT  Recommendation/Assessment: Patient will need skilled OT in the acute care venue OT Problem List: Decreased strength;Decreased activity tolerance;Impaired balance (sitting and/or standing);Decreased knowledge of use of DME or AE;Impaired UE functional use;Pain;Decreased range of motion OT Therapy Diagnosis : Generalized weakness;Cognitive deficits;Acute pain OT Plan OT Frequency: Min 1X/week OT Treatment/Interventions: Self-care/ADL training;Therapeutic exercise;DME and/or AE instruction;Therapeutic activities;Patient/family education;Balance training OT Recommendation Follow Up Recommendations: Skilled nursing facility Equipment Recommended: Rolling walker with 5" wheels Individuals Consulted Consulted and Agree with Results and Recommendations: Patient OT Goals Acute Rehab OT Goals OT Goal Formulation: With patient Time For Goal Achievement: 2 weeks ADL Goals Pt Will Perform Grooming: with modified independence;Standing at sink ADL Goal: Grooming - Progress: Goal set today Pt Will Perform Upper Body Bathing: with set-up;with supervision;Sitting, chair;Sitting, edge of bed ADL Goal: Upper Body Bathing - Progress: Goal set today Pt Will Perform Lower Body Bathing: with supervision;Sit to stand from chair;Sit to stand from bed;Sit to stand in shower ADL Goal: Lower Body Bathing - Progress: Goal set today Pt Will Perform Upper Body Dressing: with set-up;with supervision;Sitting, bed;Sitting, chair ADL Goal: Upper Body Dressing - Progress: Goal set today Pt Will Perform Lower Body Dressing: with set-up;with supervision;Sit to stand from chair;Sit to stand from bed ADL Goal: Lower Body Dressing - Progress: Goal set today Pt Will Transfer to Toilet: with modified independence;Ambulation;with DME;3-in-1 ADL Goal: Toilet Transfer - Progress: Goal set today Pt Will Perform Toileting - Clothing Manipulation: with supervision;Standing ADL Goal: Toileting -  Clothing Manipulation - Progress: Goal set  today Pt Will Perform Toileting - Hygiene: with set-up;with supervision;Standing at 3-in-1/toilet;Sitting on 3-in-1 or toilet ADL Goal: Toileting - Hygiene - Progress: Goal set today Pt Will Perform Tub/Shower Transfer: Tub transfer;Ambulation;with min assist;with DME ADL Goal: Tub/Shower Transfer - Progress: Goal set today  OT Evaluation Precautions/Restrictions  Precautions Precautions: Fall Restrictions Weight Bearing Restrictions: No Prior Functioning Home Living Lives With: Alone Receives Help From: Friend(s) Type of Home: House Home Layout: One level Home Access: Stairs to enter Entrance Stairs-Rails: Right Entrance Stairs-Number of Steps: 3 Bathroom Shower/Tub: Engineer, manufacturing systems: Standard Bathroom Accessibility: No Home Adaptive Equipment: Bedside commode/3-in-1;Shower chair without back;Straight cane (tricep cane) Prior Function Level of Independence: Needs assistance with gait;Needs assistance with homemaking Homemaking Assistance Comments: has church friend who takes her to get groceries ~once a month and organization who brings various groceries  Able to Take Stairs?: Reciprically (with difficulty) Driving: No Vocation: Retired ADL ADL Eating/Feeding: Performed;Independent Where Assessed - Eating/Feeding: Bed level Grooming: Performed;Minimal assistance Grooming Details (indicate cue type and reason): Min A with standing balance at sink Where Assessed - Grooming: Standing at sink Upper Body Bathing: Simulated;Supervision/safety;Set up Where Assessed - Upper Body Bathing: Sitting, chair Lower Body Bathing: Simulated;Minimal assistance Where Assessed - Lower Body Bathing: Sit to stand from chair Upper Body Dressing: Not assessed Lower Body Dressing: Performed;Minimal assistance Lower Body Dressing Details (indicate cue type and reason): pt cannot cross legs secondary to knee pain Where Assessed - Lower Body Dressing: Sit to stand from chair Toilet  Transfer: Performed;Minimal assistance Toilet Transfer Details (indicate cue type and reason): Pt ambulates with cane in left hand and holds to furniture with other hand.  Toilet Transfer Method: Proofreader: Regular height toilet;Grab bars Toileting - Clothing Manipulation: Performed;Minimal assistance Where Assessed - Glass blower/designer Manipulation: Standing Toileting - Hygiene: Performed;Set up;Supervision/safety Where Assessed - Toileting Hygiene: Sit to stand from 3-in-1 or toilet Tub/Shower Transfer: Not assessed Equipment Used: Cane Ambulation Related to ADLs: Pt ambulates with cane in one hand (usually Lt) and holds to furniture with other hand. At home, pt states RW will not fit through door therefore, she sometimes uses two canes.  Vision/Perception  Vision - History Patient Visual Report: No change from baseline Sensation/Coordination Sensation Light Touch: Appears Intact Coordination Gross Motor Movements are Fluid and Coordinated: Yes Fine Motor Movements are Fluid and Coordinated: Yes Extremity Assessment RUE Strength RUE Overall Strength Comments: 2+/5. pt with < half shoulder flexion. premorbid rotator cuff injury LUE Assessment LUE Assessment:  (Grossly 4/5) Mobility  Bed Mobility Supine to Sit: 4: Min assist;HOB elevated (Comment degrees) (30 degrees) End of Session OT - End of Session Equipment Utilized During Treatment: Gait belt Activity Tolerance: Patient tolerated treatment well Patient left: in chair;with call bell in reach Nurse Communication: Mobility status for transfers General Behavior During Session: [facility] for tasks performed (TALKER!) Cognition: [facility] for tasks performed   Rhylan Gross 02/28/2012, 8:43 AM

## 2012-02-28 NOTE — H&P (Signed)
Family Medicine Teaching Service Attending Note  I interviewed and examined patient [name] and reviewed their tests and x-rays.  I discussed with Dr. Cristal Fuentes and reviewed their note for today.  I agree with their assessment and plan.     Additionally  This AM is awake and alert and in no acute distress. Able to stand and walk using cane and crutch without complains of lightheadness H - RRR freq irregular beat Neuro - CN 2-7 intact strength and function seem equal  Spells - hard for her to describe.  Seem to happen at rest more than when moving so unlkely to be purely orthostatic.  Could be arrythmia with transient hypotension - monitor for 24-48 hours Could be petit mal type seizure - if she has another witnessed attack and seems to have any confusion would check head CT and EEG.    She is agreeable to NH currently.  She does not feel she is capable of taking care of herself at home

## 2012-02-28 NOTE — Progress Notes (Signed)
FMTS Attending Daily Note: Meredith Levy MD [phone] pager office [phone] I have discussed this patient with the resident and reviewed the assessment and plan as documented above. I agree wit the resident's findings and plan. Given her previous work up coupled with her feeling some similar symptoms while being interviewed by resident (and nothing untward such as LOC, absence type symptoms, staring or other pre-syncopal type signs) , I wonder if her symptoms are related to a sleep-wake disturbance. They never occur when she is lying down, only when sitting. She has been tld by family members that she appears to be sleeping (during a reported episode where the family though she was asleep and she recalls having these symptoms of inability to move or talk but aware of surroundings). Will recommend we decrease mysoline and follow.

## 2012-02-28 NOTE — Progress Notes (Signed)
02-28-12 UR completed. Ronny Flurry RN BSN

## 2012-02-28 NOTE — Progress Notes (Signed)
Following for progression and d/c planning noted plan to d/c to SNF, spoke with CSW Maralyn Sago. CSW will begin FL2 and fax out today if possible to obtain offers and hopefully d/c the first of next week. Johny Shock RN MPH Case Manager [phone]

## 2012-02-28 NOTE — Progress Notes (Signed)
CSW received consult for "SNF." Currently PT is recommending SNF. CSW met with pt to address consult. CSW introduced herself and explained role of social work. Pt lives alone and has limited social support. Pt is a member of a church and has a church family. Pt reported that she is no longer able to live on her own and agrees that she needs placement. Pt is very pleasant and agreeable to discharging to SNF. CSW will complete FL2 and send request to [facility]. CSW will continue to follow to facilitate discharge to SNF.   Dede Query, MSW, Theresia Majors [phone]

## 2012-02-29 NOTE — Progress Notes (Signed)
PGY-1 Daily Progress Note Family Medicine Teaching Service D. Piloto Rolene Arbour, MD Service Pager: [phone]  Patient name: [name]  Medical record [id number] Date of birth:[birth year] Age: 78 y.o. Gender: female  LOS: 2 days   Subjective: No episodes reported yesterday. No events overnight.  Objective: Vitals: Temp:  [97.6 F (36.4 C)-98.8 F (37.1 C)] 97.6 F (36.4 C) (03/09 0500) Pulse Rate:  [72-93] 91  (03/09 0500) Resp:  [14-18] 18  (03/09 0500) BP: (127-154)/(74-88) 139/78 mmHg (03/09 0500) SpO2:  [96 %-98 %] 96 % (03/09 0500)  Physical Exam: Constitutional: She is oriented to person, place, and time.No distress.  HENT:  Mouth/Throat: Oropharynx is clear and moist.   Eyes: EOM are normal. Pupils are equal, round, and reactive to light.  Cardiovascular: Normal rate.  Irregularly irregular, cardiac monitor reading atrial fibrillation intermittently, but PVCs, PACs noted. soft systolic murmur  Respiratory: Effort normal and breath sounds normal. No respiratory distress. no wheezes nor rales.  GI: Soft. She exhibits no distension. No tenderness, no rebound.  Musculoskeletal: no edema.  Neurological: She is alert and oriented to person, place, and time. No cranial nerve deficit. normal muscle tone. Coordination normal. Normal gait. Psychiatric: She has a normal mood and affect.   Labs and imaging:   BMET  Lab 02/28/12 0605 02/27/12 1759  NA 143 139  K 4.1 4.3  CL 110 105  CO2 25 24  BUN 16 15  CREATININE 0.68 0.64  LABGLOM -- --  GLUCOSE 102* --  CALCIUM 9.2 9.1    Dg Chest Port 1 View 02/27/2012   IMPRESSION: Stable examination with stable chronic lung disease as described. No acute process identified.  Original Report Authenticated By: Gerrianne Scale, M.D.   Medications: Scheduled Meds:   . aspirin  81 mg Oral Daily  . lisinopril  5 mg Oral Daily  . metoprolol tartrate  12.5 mg Oral BID  . primidone  50 mg Oral QHS  . pyridOXINE  100 mg  Oral Daily  . simvastatin  20 mg Oral QPM  . sodium chloride  3 mL Intravenous Q12H  . DISCONTD: metoprolol tartrate  25 mg Oral BID   Continuous Infusions:  PRN Meds:.acetaminophen, acetaminophen    Assessment and Plan: [name] is a 78 y.o. year old female With a history of CAD, HTN, HLD, DM,stroke, tobacco abuse presenting with what was initially thought to be syncopal episodes.  1. Orthostasis vs sleep-awake episodes: Atypical presentation with episodes lasting 10 minutes and patient is partially aware of surroundings, she described as is like when she is going to fall sleep but still some level or alertness. She has had multiple nights with poor sleep due to her security system in the house(ADT)buzzing. Also she is over worried of her belongings and the people that she hired to do her laundry are an issue. Pt wants to go to a "retiremient home" but not right now since she has to put things in order first. No family members close by [employer] that lives in [place]). Widowed, not children. HSe has two friends from church that help her Elyse Jarvis and Janalyn Shy.  Cardiac monitoring with only PVCs and PACs. Rate controlled.  2. History CVA. Continue daily aspirin, statin.  3. Diabetes. No oral medications noted. Glucose in normal to lows 100's. Last A1C 12/2011 at 6.5 4. Hypertension. Systolic variable between 140's after change in metoprolol and addition of lisinopril 5.Essential tremor: Continue on primidone    FEN.heart healthy  t and saline lock PIV.   PPX. SCDs   Dispo. Yesterday PT recommended SNF. Pt agreeable per SW consult. Today PT wants to go home since she "has to take care of a lot of things" before she goes to a "retirement home". MMS 25. Pt competent but not safe to go home with no extra support. Will discuss with SW since pt will need 24 supervision now that she does not want to go to SNF.  D. Piloto Rolene Arbour, MD PGY1, [facility] Medicine Teaching Service Pager [phone] 02/29/2012     y

## 2012-02-29 NOTE — Progress Notes (Signed)
FMTS Attending Daily Note: Markela Wee MD [phone] pager office [phone] I  have seen and examined this patient, reviewed their chart. I have discussed this patient with the resident. I agree with the resident's findings, assessment and care plan. 

## 2012-03-01 DIAGNOSIS — R55 Syncope and collapse: Secondary | ICD-10-CM

## 2012-03-01 NOTE — Progress Notes (Signed)
VASCULAR LAB PRELIMINARY  PRELIMINARY  PRELIMINARY  PRELIMINARY  Carotid Dopplers completed.    Preliminary report:  No ICA stenosis.  Vertebral artery flow is antegrade.  Sherren Kerns [place], 03/01/2012, 10:41 AM

## 2012-03-01 NOTE — Progress Notes (Addendum)
Attempted to meet with pt x2 re: change in d/c plan from SNF to [facility]. Pt working with staff during both visit attempts.  Unsure if pt has adequate supervision at home. Will initiate SNF search as b/u plan. Weekday CSW to f/u with pt re: supervision and provide SNF offers if needed. FL2 on chart for MD signature.  Dellie Burns, MSW, [place] [phone]

## 2012-03-01 NOTE — Progress Notes (Signed)
PGY-3 Daily Progress Note Family Medicine Teaching Service Lalo Tromp S. Edmonia James, MD Service Pager: [phone]  Patient name: [name]  Medical record [id number] Date of birth:March 31, 1920 Age: 78 y.o. Gender: female  LOS: 3 days   Subjective: No episodes reported yesterday. No events overnight. Pt states she is eating well and rested well.   Objective: Vitals: Temp:  [97.9 F (36.6 C)-98.5 F (36.9 C)] 97.9 F (36.6 C) (03/10 0500) Pulse Rate:  [60-83] 80  (03/10 0500) Resp:  [16-18] 18  (03/10 0500) BP: (115-135)/(62-74) 133/72 mmHg (03/10 0500) SpO2:  [92 %-94 %] 94 % (03/10 0500) Weight:  [108 lb 11 oz (49.3 kg)] 108 lb 11 oz (49.3 kg) (03/10 0500)  Physical Exam: Constitutional: NAD, resting in bed, talkative, appropriate responses to questions. Eyes: EOM are normal.  Cardiovascular: Normal rate.  Irregularly irregular, cardiac monitor reading atrial fibrillation intermittently, but PVCs, PACs noted. soft systolic murmur  Respiratory: Effort normal and breath sounds normal. No respiratory distress. no wheezes nor rales.  GI: Soft. She exhibits no distension. No tenderness, no rebound.  Musculoskeletal: no edema.  Neurological: normal muscle tone. Moving all 4 extremities. Alert, answers all questions appropraitely Psychiatric: She has a normal mood and affect.   Labs and imaging:   BMET  Lab 02/28/12 0605 02/27/12 1759  NA 143 139  K 4.1 4.3  CL 110 105  CO2 25 24  BUN 16 15  CREATININE 0.68 0.64  LABGLOM -- --  GLUCOSE 102* --  CALCIUM 9.2 9.1    Dg Chest Port 1 View 02/27/2012   IMPRESSION: Stable examination with stable chronic lung disease as described. No acute process identified.  Original Report Authenticated By: Gerrianne Scale, M.D.   Medications: Scheduled Meds:    . aspirin  81 mg Oral Daily  . lisinopril  5 mg Oral Daily  . metoprolol tartrate  12.5 mg Oral BID  . primidone  50 mg Oral QHS  . pyridOXINE  100 mg Oral Daily  .  simvastatin  20 mg Oral QPM  . sodium chloride  3 mL Intravenous Q12H   Continuous Infusions:  PRN Meds:.acetaminophen, acetaminophen    Assessment and Plan: HARLEM THRESHER is a 78 y.o. year old female With a history of CAD, HTN, HLD, DM,stroke, tobacco abuse presenting with what was initially thought to be syncopal episodes.   1. Orthostasis vs sleep-awake episodes: Atypical presentation with episodes lasting 10 minutes and patient is partially aware of surroundings, she described as is like when she is going to fall sleep but still some level or alertness. She has had multiple nights with poor sleep due to her security system in the house(ADT)buzzing. Also she is over worried of her belongings and the people that she hired to do her laundry are an issue. Pt wants to go to a "retiremient home" but not right now since she has to put things in order first. No family members close by [employer] that lives in [place]). Widowed, not children. HSe has two friends from church that help her Elyse Jarvis and Janalyn Shy.  Cardiac monitoring with only PVCs and PACs. Rate controlled.  Has had no further episodes since admission to hospital.  2. History CVA. Continue daily aspirin, statin.   3. Diabetes. No oral medications noted. Glucose in normal to lows 100's. Last A1C 12/2011 at 6.5  4. Hypertension. Systolic variable between 115-135 after change in metoprolol and addition of lisinopril  5.Essential tremor: Continue on primidone    FEN.heart  healthy t and saline lock PIV.    PPX. SCDs    Dispo. Yesterday PT recommended SNF. Pt agreeable per SW consult. Today PT wants to go home since she "has to take care of a lot of things" before she goes to a "retirement home". MMS 25. Pt competent but not safe to go home with no extra support. Will discuss with SW since pt will need 24 supervision now that she does not want to go to SNF.  Possible discharge home on Monday if all resources in place.   Efren Kross S.  Edmonia James, MD PGY3, Family Medicine Teaching Service Pager [phone] 03/01/2012     y

## 2012-03-02 MED ORDER — METOPROLOL TARTRATE 12.5 MG HALF TABLET: 12.5000 mg | ORAL_TABLET | Freq: Two times a day (BID) | ORAL | Status: DC

## 2012-03-02 MED ORDER — LISINOPRIL 5 MG PO TABS: 5.0000 mg | ORAL_TABLET | Freq: Every day | ORAL | Status: DC

## 2012-03-02 NOTE — Progress Notes (Addendum)
Chart reviewed and noted that plan for SNF has changed. MD please be aware that 24hr supervision is not available with pt insurance provider. Most insurances do not pay for in home supervison or assistance with ADLs. Pt would have to pay out of pocket this this kind of in home care , she would also have to arrange privately. Also noted [facility] referral, MD please be aware that pt out of pocket would be very expensive this pt does not have Medicaid to assist with this program so she would have to pay out of pocket in addition to being in her home alone in the evening and at night.  Will speak with MD. Johny Shock RN MPH Case Manager [phone]

## 2012-03-02 NOTE — Discharge Summary (Signed)
I interviewed and examined this patient and discussed the care plan with Dr.Piloto and the FPTS team and agree with assessment and plan as documented in the discharge note for today.    Meredith Calixto A. Sheffield Slider, MD Family Medicine Teaching Service Attending  03/02/2012 12:46 PM

## 2012-03-02 NOTE — Progress Notes (Signed)
O.T. NOTE FOR SOCIAL WORKER/CASE MANAGER  Pt. States she DOES want SNF placement, and has been planning on going to one for approx. 6 months. States that her plan is to go home with assistance from church members (she states they can stay over night if needed). Says she needs to get her house and her affairs in order prior to going SNF.  Spoke with cheryl (case Production designer, theatre/television/film) she states that social work can con't. To work with pt. On this and pt. Will likely need HHSW to assist with SNF placement from home once she is ready. Reviewed with pt. That she would have to have the recommended Supervision at home/d/c secondary to safety concerns. Told her we would want confirmation of the assistance she would have from the church. She said it would be the associate pastor assisting her.   Thank you, Tommy Medal, COTA/L

## 2012-03-02 NOTE — Progress Notes (Signed)
Occupational Therapy Treatment Patient Details Name: [name] MRN: [id number] DOB: 1920/12/15 Today's Date: 03/02/2012  OT Assessment/Plan OT Assessment/Plan Comments on Treatment Session: pt. discussed at length that she never said she would not go to SNF, but that she wanted to briefly go home to "get her affairs in order" and get home ready then she wanted to go SNF. spoke with nurse CM and documented info. regarding our phone call and the pts. comments.  OT Frequency: Min 1X/week Follow Up Recommendations: Skilled nursing facility OT Goals Acute Rehab OT Goals Time For Goal Achievement: 2 weeks ADL Goals ADL Goal: Grooming - Progress: Progressing toward goals ADL Goal: Toilet Transfer - Progress: Progressing toward goals ADL Goal: Toileting - Clothing Manipulation - Progress: Progressing toward goals ADL Goal: Toileting - Hygiene - Progress: Progressing toward goals ADL Goal: Tub/Shower Transfer - Progress: Progressing toward goals  OT Treatment Precautions/Restrictions  Precautions Precautions: Fall Restrictions Weight Bearing Restrictions: No   ADL ADL Grooming: Performed;Wash/dry hands;Minimal assistance Where Assessed - Grooming: Standing at sink Toilet Transfer: Research scientist (life sciences) Details (indicate cue type and reason): uses cane in left hand and furniture walks with the other hand Toilet Transfer Method: Proofreader: Regular height toilet;Grab bars Toileting - Clothing Manipulation: Performed;Supervision/safety Where Assessed - Glass blower/designer Manipulation: Standing Toileting - Hygiene: Performed;Supervision/safety Where Assessed - Toileting Hygiene: Standing Equipment Used: Cane Ambulation Related to ADLs: ambulates with cane (left hand), but also furniture walks Mobility  Bed Mobility Bed Mobility: No Transfers Transfers: Yes Sit to Stand: 5: Supervision;With armrests;With upper extremity  assist;From chair/3-in-1 Sit to Stand Details (indicate cue type and reason): increased time to get into full standing position, posterior LOB noted x1, pt. able to self correct Stand to Sit: 5: Supervision;With upper extremity assist;With armrests;To chair/3-in-1 Exercises    End of Session OT - End of Session Activity Tolerance: Patient tolerated treatment well Patient left: in chair;with call bell in reach Nurse Communication: Other (comment) (refer to p. notes, spoke with CM regarding d/c plans) General Behavior During Session: [facility] for tasks performed Cognition: [facility] for tasks performed  Robet Leu COTA/L 03/02/2012, 2:06 PM

## 2012-03-02 NOTE — Progress Notes (Signed)
Discharge review done with patient.   Patient acknowledged understanding of information provided.  Patient is stable, waiting for a ride home. [name]  

## 2012-03-02 NOTE — Discharge Summary (Signed)
Physician Discharge Summary  Patient ID: [name] [id number] 1920-07-19 78 y.o.  Admit date: 02/27/2012 Discharge date: 03/02/2012  PCP: Rodman Pickle, MD, MD   Discharge Diagnosis: 1.DM 2.HTN 3.HLD 4. Hx CAD 5. Hx CVA 6. Hx Carotid disease  7. Essential tremor    Discharge Medications  Rhiann, Boucher  Home Medication Instructions [id number]   Printed on:03/02/12 1206  Medication Information                    CALCIUM PO Take 1 tablet by mouth 2 (two) times daily.            acetaminophen (TYLENOL ARTHRITIS PAIN) 650 MG CR tablet Take 650 mg by mouth as needed. For arthritis pain.           Ginkgo Biloba 120 MG CAPS Take 120 mg by mouth daily.            ferrous sulfate 325 (65 FE) MG tablet Take 325 mg by mouth daily with breakfast.             Black Cohosh 160 MG CAPS Take 160 mg by mouth daily.            Cyanocobalamin (B-12) 100 MCG TABS Take 100 mcg by mouth daily.            Pyridoxine HCl (B-6) 100 MG TABS Take 100 mg by mouth daily.            primidone (MYSOLINE) 50 MG tablet Take 50 mg by mouth at bedtime.             aspirin 81 MG chewable tablet Chew 81 mg by mouth daily.           metoprolol tartrate (LOPRESSOR) 25 MG tablet Take 25 mg by mouth 2 (two) times daily.           simvastatin (ZOCOR) 20 MG tablet Take 20 mg by mouth every evening.             Consults: Case Production designer, theatre/television/film, Child psychotherapist, PT/OT  Labs: CBC  Lab 02/27/12 2329 02/27/12 1759  WBC 7.3 8.3  HGB 12.2 13.1  HCT 36.4 39.1  PLT 257 270   BMET  Lab 02/28/12 0605 02/27/12 1759  NA 143 139  K 4.1 4.3  CL 110 105  CO2 25 24  BUN 16 15  CREATININE 0.68 0.64  CALCIUM 9.2 9.1  PROT -- --  BILITOT -- --  ALKPHOS -- --  ALT -- --  AST -- --  GLUCOSE 102* 108*   No results found for this or any previous visit (from the past 72 hour(s)).  Procedures/Imaging:  Dg Chest Port 1 View 02/27/2012  IMPRESSION: Stable examination with stable chronic lung  disease as described. No acute process identified.  Original Report Authenticated By: Gerrianne Scale, M.D.     Brief Hospital Course: [name] is a 78 y.o. year old female With a history of CAD, HTN, HLD, DM,stroke, tobacco abuse presenting with what was initially thought to be syncopal episodes.   1. Transient orthostasis vs sleep-awake episodes: Atypical presentation with episodes lasting 10 minutes and patient is partially aware of surroundings, she described as is like when she is going to fall sleep but still some level or alertness. She has had multiple nights with poor sleep. Pt wants to go to a "retiremient home" but not right now since she has to put things in order first. No family members close  by, widowed, not children. She has two friends from church that help her Elyse Jarvis and Janalyn Shy.  Cardiac monitoring with only PVCs and PACs. Rate controlled.   2. History CVA. Continue daily aspirin, statin.   3. Diabetes. No oral medications noted. Glucose in normal to lows 100's. Last A1C 12/2011 at 6.5 4. Hypertension. Systolic variable between 140's after change in metoprolol from 25 BID to 12.5 BID and addition of lisinopril 5mg  on this hospitalization.  5.Essential tremor: Continue on primidone   Patient condition at time of discharge/disposition:  Patient is discharge home on stable medical condition.   Discharge follow up:  Close f/u Dr Mikel Cella    D. Piloto Rolene Arbour, MD  [facility] 03/02/2012

## 2012-03-02 NOTE — Progress Notes (Signed)
  PGY-1 Daily Progress Note Family Medicine Teaching Service D. Piloto Rolene Arbour, MD Service Pager: [phone]  Patient name: [name]  Medical record [id number] Date of birth:04-23-1920 Age: 78 y.o. Gender: female  LOS: 4 days   Subjective: No episodes reported yesterday. No events overnight.  Objective: Vitals: Temp:  [98 F (36.7 C)-98.8 F (37.1 C)] 98 F (36.7 C) (03/11 0500) Pulse Rate:  [60-80] 70  (03/11 0500) Resp:  [16-20] 16  (03/11 0500) BP: (110-147)/(57-74) 147/68 mmHg (03/11 0500) SpO2:  [98 %-100 %] 98 % (03/11 0500) FiO2 (%):  [2 %] 2 % (03/11 0500) Weight:  [110 lb 3.7 oz (50 kg)] 110 lb 3.7 oz (50 kg) (03/11 0500)  Physical Exam: Constitutional: She is oriented to person, place, and time.No distress.  HENT:  Mouth/Throat: Oropharynx is clear and moist.   Eyes: EOM are normal. Pupils are equal, round, and reactive to light.  Cardiovascular: Normal rate.  Irregularly irregular, cardiac monitor reading atrial fibrillation intermittently, but PVCs, PACs noted. soft systolic murmur  Respiratory: Effort normal and breath sounds normal. No respiratory distress. no wheezes nor rales.  GI: Soft. She exhibits no distension. No tenderness, no rebound.  Musculoskeletal: no edema.  Neurological: She is alert and oriented to person, place, and time. No cranial nerve deficit. normal muscle tone. Coordination normal. Normal gait. Psychiatric: She has a normal mood and affect.   Labs and imaging:   BMET  Lab 02/28/12 0605 02/27/12 1759  NA 143 139  K 4.1 4.3  CL 110 105  CO2 25 24  BUN 16 15  CREATININE 0.68 0.64  LABGLOM -- --  GLUCOSE 102* --  CALCIUM 9.2 9.1    Medications: Scheduled Meds:    . aspirin  81 mg Oral Daily  . lisinopril  5 mg Oral Daily  . metoprolol tartrate  12.5 mg Oral BID  . primidone  50 mg Oral QHS  . pyridOXINE  100 mg Oral Daily  . simvastatin  20 mg Oral QPM  . sodium chloride  3 mL Intravenous Q12H   Continuous  Infusions:  PRN Meds:.acetaminophen, acetaminophen    Assessment and Plan: [name] is a 78 y.o. year old female With a history of CAD, HTN, HLD, DM,stroke, tobacco abuse presenting with what was initially thought to be syncopal episodes.  1. Orthostasis vs sleep-awake episodes: Mildly orthostatic on admission. Cardiac monitoring with only PVCs and PACs. Rate controlled.  -Repeat Ortostatics after medication change. 2. History CVA. Continue daily aspirin, statin.  3. Diabetes. No oral medications noted. Glucose in normal to lows 100's. Last A1C 12/2011 at 6.5 4. Hypertension. Systolic variable between 140's after change in metoprolol and addition of lisinopril. Continue.  5.Essential tremor: Continue on primidone    FEN.heart healthy t and saline lock PIV.   PPX. SCDs   Dispo. PT recommended SNF. Pt agreeable per SW consult.  But now pt  wants to go home since she "has to take care of a lot of things" before she goes to a "retirement home". MMS 25. Pt competent but not safe to go home with no extra support. Will discuss with SW since pt will need 24 supervision now that she does not want to go to SNF.  D. Piloto Rolene Arbour, MD PGY1, [facility] Medicine Teaching Service Pager [phone] 03/02/2012

## 2012-03-02 NOTE — Progress Notes (Signed)
Physical Therapy Treatment Patient Details Name: [name] MRN: [id number] DOB: 05/20/1920 Today's Date: 03/02/2012  PT Assessment/Plan  PT - Assessment/Plan Comments on Treatment Session: Patient admitted with syncopal episodes.  Patient appears to be at baseline level.  Feel patient functioned at submarginal level PTA anyway.  Patient states she will have 24 hour care by church members and refusing nursing home.  Recommend home with HHPT f/u.  Has equipment. PT Plan: Discharge plan remains appropriate;Frequency remains appropriate PT Frequency: Min 3X/week Follow Up Recommendations: Home health PT;Supervision/Assistance - 24 hour Equipment Recommended: None recommended by PT PT Goals  Acute Rehab PT Goals PT Goal: Sit to Stand - Progress: Progressing toward goal PT Goal: Stand to Sit - Progress: Progressing toward goal PT Goal: Ambulate - Progress: Progressing toward goal  PT Treatment Precautions/Restrictions  Precautions Precautions: Fall Required Braces or Orthoses: Yes Restrictions Weight Bearing Restrictions: No Mobility (including Balance) Bed Mobility Bed Mobility: No Supine to Sit: Not tested (comment) Transfers Sit to Stand: 6: Modified independent (Device/Increase time);With upper extremity assist;From chair/3-in-1;With armrests Sit to Stand Details (indicate cue type and reason): Patient takes incr time.  No LOB noted  Stand to Sit: 5: Supervision;With upper extremity assist;With armrests;To chair/3-in-1 Stand to Sit Details: Patient has good control of descent but on both attempts of stand to sit, patient did not get as close to chair as she should have and sat on edge of chair.  Reinforced safety with stand to sit and patient verbalized understanding.  Ambulation/Gait Ambulation/Gait Assistance: 6: Modified independent (Device/Increase time) Ambulation/Gait Assistance Details (indicate cue type and reason): Patient modif independent in controlled environment  to ambulate with cane and crutch.  No LOB with patient demonstrating safety with cane and crutch.  Premorbid deficits secondary to postural limitations.   Ambulation Distance (Feet): 150 Feet Assistive device: Straight cane (and 1 crutch) Gait Pattern: Step-to pattern;Decreased stride length (Waddling, wide base secondary to left genu valgus - severe) Stairs: No Wheelchair Mobility Wheelchair Mobility: No  Posture/Postural Control Posture/Postural Control: Postural limitations Postural Limitations: severe genu valgus and left foot pronated with stance Balance Balance Assessed: Yes Static Standing Balance Static Standing - Balance Support: Bilateral upper extremity supported;During functional activity Static Standing - Level of Assistance: 4: Min assist (guard assist) Static Standing - Comment/# of Minutes: 2 minutes with crutch and cane without LOB  Dynamic Standing Balance Dynamic Standing - Balance Support: Bilateral upper extremity supported;During functional activity Dynamic Standing - Level of Assistance: 4: Min assist (guard assist) Dynamic Standing - Balance Activities: Forward lean/weight shifting;Reaching for objects;Reaching across midline High Level Balance High Level Balance Activites: Turns;Direction changes High Level Balance Comments: min guard assist for above activities   End of Session PT - End of Session Equipment Utilized During Treatment: Gait belt Activity Tolerance: Patient tolerated treatment well Patient left: in chair;with call bell in reach Nurse Communication: Mobility status for transfers;Mobility status for ambulation General Behavior During Session: [facility] for tasks performed Cognition: [facility] for tasks performed  INGOLD,Halleigh Comes 03/02/2012, 3:44 PM  [facility] [phone] (pager)

## 2012-03-02 NOTE — Progress Notes (Signed)
CSW met with pt to discuss discharge plan. PT/OT are currently recommending SNF, however pt is declining SNF at this time. Pt shared that she will enter a SNF when she has gotten her home "affairs" in order. Pt reported that church members will be providing transportation and support for her at home. RNCM is aware and will be arranging home health services. Home health social worker will assist pt with SNF place. Pt has a valid PASARR # and a completed FL2. SNF request has been sent to [facility]. CSW provided pt with bed offers. CSW is signing off as pt is declining SNF and requesting to discharge home. Pt to discharge home today and transportation provided by a church member.   Dede Query, MSW, [facility] [phone]

## 2012-03-02 NOTE — Progress Notes (Signed)
I interviewed and examined this patient and discussed the care plan with Dr. Aviva Signs and the [facility] team and agree with assessment and plan as documented in the discharge note for today.    Syd Newsome A. Sheffield Slider, MD Family Medicine Teaching Service Attending  03/02/2012 12:43 PM

## 2012-03-02 NOTE — Progress Notes (Signed)
Patient's friend here to pick her up for discharge, requesting home health services.  Patient refused placement with CSW this afternoon.  MD paged and notified.  Orders received.  Theresa Mulligan RN CM called and notified.  Care Manager spoke with patient and arranged home health services.  OK to d/c patient per [facility] RN CM.  Colman Cater

## 2012-03-03 NOTE — Progress Notes (Signed)
Late entry:     CARE MANAGEMENT NOTE 03/03/2012  Patient:  [name], [name]   Account Number:  [id number]  Date Initiated:  02/28/2012  Documentation initiated by:  Ronny Flurry  Subjective/Objective Assessment:   DX: Syncope     Action/Plan:   03/03/2012 Pt d/c late 03/02/2012   Anticipated DC Date:  03/02/2012   Anticipated DC Plan:  HOME W HOME HEALTH SERVICES  In-house referral  Clinical Social Worker      DC Planning Services  CM consult      Choice offered to / List presented to:          [facility] arranged  HH-1 RN  HH-2 PT  HH-6 SOCIAL WORKER      HH agency  [facility]   Status of service:  Completed, signed off Medicare Important Message given?   (If response is "NO", the following Medicare IM given date fields will be blank) Date Medicare IM given:   Date Additional Medicare IM given:    Discharge Disposition:  HOME W HOME HEALTH SERVICES  Per UR Regulation:  Reviewed for med. necessity/level of care/duration of stay  If discussed at Long Length of Stay Meetings, dates discussed:    Comments:  03/02/2012 Noted pt asking to return to home before going to SNF, MD made aware that 24 hr supervision in the home is not available unless pt is able to privately pay for this service and she would also need to arrange privately. MD also made aware that pt does not have Medicaid therefore would have out of pocket expenses for PACE and no care in the evenings, nights or weekends. Will continue to follow. Johny Shock RN MPH Case Manager [phone]  03/03/2012 Late d/c and pt has decided to d/c to home with church family to assist until she has prepared to go to SNF. Will need HH and has used [facility] in the past, they will continue services. Johny Shock RN MPH case Production designer, theatre/television/film

## 2012-03-12 ENCOUNTER — Ambulatory Visit (INDEPENDENT_AMBULATORY_CARE_PROVIDER_SITE_OTHER): Payer: Medicare Other | Admitting: Family Medicine

## 2012-03-12 ENCOUNTER — Telehealth: Payer: Self-pay | Admitting: Family Medicine

## 2012-03-12 VITALS — BP 165/84 | HR 92 | Temp 98.4°F | Ht 63.0 in | Wt 110.0 lb

## 2012-03-12 DIAGNOSIS — N3941 Urge incontinence: Secondary | ICD-10-CM

## 2012-03-12 DIAGNOSIS — G4700 Insomnia, unspecified: Secondary | ICD-10-CM

## 2012-03-12 DIAGNOSIS — R32 Unspecified urinary incontinence: Secondary | ICD-10-CM

## 2012-03-12 DIAGNOSIS — I10 Essential (primary) hypertension: Secondary | ICD-10-CM

## 2012-03-12 DIAGNOSIS — F17200 Nicotine dependence, unspecified, uncomplicated: Secondary | ICD-10-CM

## 2012-03-12 DIAGNOSIS — M79609 Pain in unspecified limb: Secondary | ICD-10-CM

## 2012-03-12 DIAGNOSIS — G252 Other specified forms of tremor: Secondary | ICD-10-CM

## 2012-03-12 DIAGNOSIS — M79676 Pain in unspecified toe(s): Secondary | ICD-10-CM

## 2012-03-12 MED ORDER — METOPROLOL TARTRATE 12.5 MG HALF TABLET: 25.0000 mg | ORAL_TABLET | Freq: Two times a day (BID) | ORAL | Status: DC

## 2012-03-12 MED ORDER — METOPROLOL TARTRATE 25 MG PO TABS: 25.0000 mg | ORAL_TABLET | Freq: Two times a day (BID) | ORAL | Status: DC

## 2012-03-12 MED ORDER — OXYBUTYNIN CHLORIDE ER 10 MG PO TB24: 10.0000 mg | ORAL_TABLET | Freq: Every day | ORAL | Status: DC

## 2012-03-12 MED ORDER — TRAZODONE HCL 50 MG PO TABS: 25.0000 mg | ORAL_TABLET | Freq: Every evening | ORAL | Status: DC | PRN

## 2012-03-12 NOTE — Progress Notes (Signed)
Addended by: Edd Arbour on: 03/12/2012 11:30 AM   Modules accepted: Orders

## 2012-03-12 NOTE — Assessment & Plan Note (Signed)
BP Readings from Last 3 Encounters:  03/12/12 165/84  03/02/12 147/70  02/27/12 142/82  Patient has slightly elevated BP today. I would rather her maintain her current pressure at her age than get below 130/80. I think this increases perfusion and decreases risk of orthostatic hypotension and ischemic stroke.

## 2012-03-12 NOTE — Assessment & Plan Note (Signed)
Advised patient to quit. She wants to quit. She is down to 8 cigarettes per day. She increases smoking when she is stressed. I have advised her to call [phone] quitnow for free gum/patch samples.

## 2012-03-12 NOTE — Assessment & Plan Note (Signed)
Patient has good sleep hygiene, but still has difficulty falling asleep at night. She has tried melatonin. I have prescribed trazodone 25-50 mg at night.

## 2012-03-12 NOTE — Progress Notes (Signed)
  Subjective:    Patient ID: [name], female    DOB: 02-May-1920, 78 y.o.   MRN: [id number]  HPI 1. Toe pain (left 1st toe) Discolored onychomycosis of the left 1st toenail. She has hammertoe. She has good pulses and good intact sensation. No ulceration.   2. Tobacco use Patient is down to 8 cigarettes per day. She wants to quit. She primarily smokes when she is anxious.   3. Hx syncope No syncope since discharge from the hospital. She says she falls asleep on the cough when watching tv. No falls.   4. HTN BP Readings from Last 3 Encounters:  03/12/12 165/84  03/02/12 147/70  02/27/12 142/82  no headache, visual changes.   5. Intention tremor Patient has no resting tremor. No pill rolling tremor. She has a mild tremor with finger to nose.   6. Urinary incontinence Long standing history. She uses pads. She has accidents. She has frequency and urge. She has no dysuria. She is not on any medications.  7. Insomnia Has tried melatonin.  She has good sleep hygiene.  She does not drink coffee. Unrelated to breathing/acid reflux/urination. Mainly difficulty falling asleep.   Review of Systems Pertinent items are noted in HPI. No fever, chills, night sweats, weight loss. No blood in stool, no constipation.     Objective:   Physical Exam Filed Vitals:   03/12/12 0922  BP: 165/84  Pulse: 92  Temp: 98.4 F (36.9 C)  TempSrc: Oral  Height: 5\' 3"  (1.6 m)  Weight: 110 lb (49.896 kg)  Lungs:  Normal respiratory effort, chest expands symmetrically. Lungs are clear to auscultation, no crackles or wheezes. Heart - Regular rate and irregular rhythm.  No murmurs, gallops or rubs.   (reviewed ekg's from recent admssion: pvc's, no AFIB) Abdomen: soft and non-tender without masses, organomegaly or hernias noted.  No guarding or rebound Extremities:  No cyanosis, edema, or deformity noted with good range of motion of all major joints.   Skin:  Intact without suspicious lesions or  rashes Neurologic exam : Cn 2-7 intact Strength equal & normal in upper & lower extremities Walks with arm crutches.  finger to nose shows intention tremor.    Assessment & Plan:

## 2012-03-12 NOTE — Assessment & Plan Note (Signed)
I have prescribed oxybutynin 10 mg. I explained anticholinergic side effects to her. She understands the side effects and she will stop the medication if this occurs. Her God-daughter Mrs. Elige Radon was there as well and understood my explanation.  She will continue to use pads.

## 2012-03-12 NOTE — Assessment & Plan Note (Signed)
Patient has an intention tremor, no resting tremor or essential tremor. No treatment needed. No parkinsonian symptoms.

## 2012-03-12 NOTE — Telephone Encounter (Signed)
Attempted to notify patient about increasing her metoprolol from 12.5 mg BID to 25 mg BID. The line was busy on several attempts.

## 2012-03-12 NOTE — Patient Instructions (Signed)
It was great to see you today!  Schedule an appointment to see me as needed.  I think assisted living is the best option for you.  Bring in your paperwork for handicap parking.  I have prescribed you Trazodone 50 mg at bedtime for sleep. I have prescribed you oxybutynin 10 mg daily for urinary issues. - if you feel more dizzy or sleepy, stop taking it.  Please call 1800 QUITNOW for help with quitting smoking.

## 2012-03-12 NOTE — Progress Notes (Signed)
Addended by: Damita Lack on: 03/12/2012 12:21 PM   Modules accepted: Orders

## 2012-03-12 NOTE — Progress Notes (Signed)
Addended by: Zachery Dauer on: 03/12/2012 11:19 AM   Modules accepted: Orders

## 2012-03-13 ENCOUNTER — Telehealth: Payer: Self-pay | Admitting: Family Medicine

## 2012-03-13 NOTE — Telephone Encounter (Signed)
Nurse from [facility] would like 1 more visit for next week due to BP still being elevated.  She would also like any recommendations for treatment for this patient.

## 2012-03-16 ENCOUNTER — Telehealth: Payer: Self-pay | Admitting: Family Medicine

## 2012-03-16 NOTE — Telephone Encounter (Signed)
Previous Encounter closed in error.  Nurse for [facility] is just needing the ok for 1 more visit this week.  A verbal ok can be left on voicemail if you do not get her.

## 2012-03-16 NOTE — Telephone Encounter (Signed)
Verbal order  given to Clydie Braun , [facility] Nurse

## 2012-03-16 NOTE — Telephone Encounter (Signed)
The Nurse for Home Health is calling back about 1 more visit for this week.

## 2012-03-16 NOTE — Telephone Encounter (Signed)
Please give verbal order for [facility] visit.  Thank you Fernando Stoiber M. Cylan Borum, M.D.

## 2012-03-16 NOTE — Telephone Encounter (Signed)
Dr. Mikel Cella has ok'd the Verbal Order, but I don't think I am able to give that.

## 2012-03-19 ENCOUNTER — Ambulatory Visit (INDEPENDENT_AMBULATORY_CARE_PROVIDER_SITE_OTHER): Payer: Medicare Other | Admitting: Family Medicine

## 2012-03-19 ENCOUNTER — Telehealth: Payer: Self-pay | Admitting: Family Medicine

## 2012-03-19 ENCOUNTER — Encounter: Payer: Self-pay | Admitting: Family Medicine

## 2012-03-19 VITALS — BP 132/88 | HR 78 | Ht 60.0 in | Wt 110.0 lb

## 2012-03-19 DIAGNOSIS — M170 Bilateral primary osteoarthritis of knee: Secondary | ICD-10-CM

## 2012-03-19 DIAGNOSIS — M1710 Unilateral primary osteoarthritis, unspecified knee: Secondary | ICD-10-CM

## 2012-03-19 DIAGNOSIS — R5381 Other malaise: Secondary | ICD-10-CM

## 2012-03-19 DIAGNOSIS — R5383 Other fatigue: Secondary | ICD-10-CM

## 2012-03-19 DIAGNOSIS — N3941 Urge incontinence: Secondary | ICD-10-CM

## 2012-03-19 MED ORDER — TRAMADOL HCL 50 MG PO TABS: 50.0000 mg | ORAL_TABLET | Freq: Three times a day (TID) | ORAL | Status: AC | PRN

## 2012-03-19 MED ORDER — ACETAMINOPHEN ER 650 MG PO TBCR: 650.0000 mg | EXTENDED_RELEASE_TABLET | Freq: Three times a day (TID) | ORAL | Status: DC

## 2012-03-19 NOTE — Assessment & Plan Note (Signed)
Using oxybutynin, only took it last night with some relief.  We are worried about some memory effects from the anticholinergic effects. We will watch this.

## 2012-03-19 NOTE — Telephone Encounter (Signed)
Nurse from [facility] is calling to find out if Home Health needs to plan on continuing or DCing.

## 2012-03-19 NOTE — Telephone Encounter (Signed)
Please continue Home health services for now, as long as insurance will cover these services.  Patient needs extra support, and we appreciate Home Health's assistance.  Thank you! Lisle Skillman M. Gini Caputo, M.D.

## 2012-03-19 NOTE — Telephone Encounter (Signed)
Left message on voicemail for Clydie Braun to continue home health.

## 2012-03-19 NOTE — Progress Notes (Signed)
Patient ID: [name], female   DOB: 09/20/1920, 78 y.o.   MRN: [id number] I interviewed and examined this patient and discussed the care plan with Dr. Rivka Safer and agree with assessment and plan as documented in the visit note for today.    Tahir Blank A. Sheffield Slider, MD Family Medicine Teaching Service Attending  03/19/2012 2:59 PM

## 2012-03-19 NOTE — Progress Notes (Signed)
  Subjective:    Patient ID: [name], female    DOB: 04-06-1920, 78 y.o.   MRN: [id number]  HPI 1. "woozy feeling" fatigue Patient is on Primidone for unknown reason. She has a MMSE of 26/30. She does have short term memory loss. She has been more "aggressive" lately per her caretaker. She has no falls. No orthostatic symptoms. No stroke like symptoms. Speech intact. She does not describe this as dizziness. More a tiredness in the morning. The history was vague.   2. HTN - started metoprolol 25 mg last visit.  No side effects.; no dizziness. No falls. No orthostatic symptoms.  Orthostatics normal today, see extended vitals. BP Readings from Last 3 Encounters:  03/19/12 132/88  03/12/12 165/84  03/02/12 147/70  no headache, visual changes.   3. Intention tremor Patient has no resting tremor. No pill rolling tremor. She has no intention tremor today. She was started on metoprolol 25 mg last visit 3/21.  4. Urinary incontinence Long standing history. She uses pads. She has accidents. She has frequency and urge. She has no dysuria. She has tried oxybutynin once last night with relief from urination during the night. No effects on memory at this time. She has only taken the medication once.   5. Insomnia Has tried melatonin.  She has good sleep hygiene.  She does not drink coffee. Unrelated to breathing/acid reflux/urination. Mainly difficulty falling asleep - she has been using trazodone that we prescribed last visit with some success.   6. Osteoarthritis of the knees/shoulders Patient takes tylenol off and on for this. Worse in the morning, better with movement. No joint swelling/redness/tenderness. No fevers. No rash.  She does not take any other pain medications.   Review of Systems Pertinent items are noted in HPI. No fever, chills, night sweats, weight loss. No blood in stool, no constipation.     Objective:   Physical Exam Filed Vitals:   03/19/12 0852 03/19/12  0930 03/19/12 0931 03/19/12 0937  BP: 114/78 147/75 132/88 132/88  Pulse: 70 93 90 78  Height: 5' (1.524 m)     Weight: 110 lb (49.896 kg)     Lungs:  Normal respiratory effort, chest expands symmetrically. Lungs are clear to auscultation, no crackles or wheezes. Heart - Regular rate and irregular rhythm.  No murmurs, gallops or rubs.   (reviewed ekg's from recent admssion: pvc's, no AFIB) Abdomen: soft and non-tender without masses, organomegaly or hernias noted.  No guarding or rebound Extremities:  No cyanosis, edema, or deformity noted with good range of motion of all major joints.   Skin:  Intact without suspicious lesions or rashes Neurologic exam : Cn 2-7 intact Strength equal & normal in upper & lower extremities Walks with arm crutches.  finger to nose shows intention tremor.    Assessment & Plan:

## 2012-03-19 NOTE — Assessment & Plan Note (Signed)
likely related to age. But we will stop her Primidone (barbiturate) on the beers list and has a lot of sedating effects.

## 2012-03-19 NOTE — Patient Instructions (Signed)
It was great to see you today!  Schedule an appointment to see your PCP as needed.  I have prescribed Tylenol 650 mg tabs three times a day. I have prescribed Tramadol 50 mg daily as needed.  Stop the Primidone for sure.  If you have dry mouth or memory loss, stop the trazodone (for sleep) and the Oxybutynin (for urinary incontinence)  Your memory isn't too bad. You scored 26/30 on the Mental Status exam today.

## 2012-05-02 ENCOUNTER — Inpatient Hospital Stay (HOSPITAL_COMMUNITY)
Admission: EM | Admit: 2012-05-02 | Discharge: 2012-05-04 | DRG: 309 | Disposition: A | Discharge status: Home / Self Care | Payer: Medicare Other | Source: Ambulatory Visit | Attending: Family Medicine | Admitting: Family Medicine

## 2012-05-02 ENCOUNTER — Emergency Department (HOSPITAL_COMMUNITY): Payer: Medicare Other

## 2012-05-02 ENCOUNTER — Encounter (HOSPITAL_COMMUNITY): Payer: Self-pay

## 2012-05-02 DIAGNOSIS — F17200 Nicotine dependence, unspecified, uncomplicated: Secondary | ICD-10-CM | POA: Diagnosis present

## 2012-05-02 DIAGNOSIS — I10 Essential (primary) hypertension: Secondary | ICD-10-CM

## 2012-05-02 DIAGNOSIS — I5032 Chronic diastolic (congestive) heart failure: Secondary | ICD-10-CM | POA: Diagnosis present

## 2012-05-02 DIAGNOSIS — Z79899 Other long term (current) drug therapy: Secondary | ICD-10-CM

## 2012-05-02 DIAGNOSIS — E785 Hyperlipidemia, unspecified: Secondary | ICD-10-CM | POA: Diagnosis present

## 2012-05-02 DIAGNOSIS — R7303 Prediabetes: Secondary | ICD-10-CM | POA: Diagnosis present

## 2012-05-02 DIAGNOSIS — I2510 Atherosclerotic heart disease of native coronary artery without angina pectoris: Secondary | ICD-10-CM | POA: Diagnosis present

## 2012-05-02 DIAGNOSIS — Z888 Allergy status to other drugs, medicaments and biological substances status: Secondary | ICD-10-CM

## 2012-05-02 DIAGNOSIS — Z8673 Personal history of transient ischemic attack (TIA), and cerebral infarction without residual deficits: Secondary | ICD-10-CM

## 2012-05-02 DIAGNOSIS — I471 Supraventricular tachycardia: Secondary | ICD-10-CM

## 2012-05-02 DIAGNOSIS — R000 Tachycardia, unspecified: Principal | ICD-10-CM | POA: Diagnosis present

## 2012-05-02 DIAGNOSIS — I4719 Other supraventricular tachycardia: Secondary | ICD-10-CM

## 2012-05-02 DIAGNOSIS — E119 Type 2 diabetes mellitus without complications: Secondary | ICD-10-CM | POA: Diagnosis present

## 2012-05-02 DIAGNOSIS — Z7982 Long term (current) use of aspirin: Secondary | ICD-10-CM

## 2012-05-02 DIAGNOSIS — M810 Age-related osteoporosis without current pathological fracture: Secondary | ICD-10-CM | POA: Diagnosis present

## 2012-05-02 DIAGNOSIS — R0602 Shortness of breath: Secondary | ICD-10-CM

## 2012-05-02 DIAGNOSIS — M1710 Unilateral primary osteoarthritis, unspecified knee: Secondary | ICD-10-CM | POA: Diagnosis present

## 2012-05-02 DIAGNOSIS — I509 Heart failure, unspecified: Secondary | ICD-10-CM | POA: Diagnosis present

## 2012-05-02 HISTORY — DX: Unspecified osteoarthritis, unspecified site: M19.90

## 2012-05-02 HISTORY — DX: Personal history of other diseases of the digestive system: Z87.19

## 2012-05-02 HISTORY — DX: Thyrotoxicosis, unspecified without thyrotoxic crisis or storm: E05.90

## 2012-05-02 HISTORY — DX: Anemia, unspecified: D64.9

## 2012-05-02 HISTORY — DX: Acute upper respiratory infection, unspecified: J06.9

## 2012-05-02 HISTORY — DX: Headache: R51

## 2012-05-02 HISTORY — DX: Myoneural disorder, unspecified: G70.9

## 2012-05-02 HISTORY — DX: Unspecified convulsions: R56.9

## 2012-05-02 HISTORY — DX: Fibromyalgia: M79.7

## 2012-05-02 LAB — DIFFERENTIAL
Basophils Relative: 0 % (ref 0–1)
Eosinophils Absolute: 0 10*3/uL (ref 0.0–0.7)
Monocytes Absolute: 0.8 10*3/uL (ref 0.1–1.0)
Monocytes Relative: 8 % (ref 3–12)
Neutrophils Relative %: 83 % — ABNORMAL HIGH (ref 43–77)

## 2012-05-02 LAB — COMPREHENSIVE METABOLIC PANEL
Albumin: 4 g/dL (ref 3.5–5.2)
BUN: 25 mg/dL — ABNORMAL HIGH (ref 6–23)
Creatinine, Ser: 1.22 mg/dL — ABNORMAL HIGH (ref 0.50–1.10)
Potassium: 4.3 mEq/L (ref 3.5–5.1)
Total Protein: 7.6 g/dL (ref 6.0–8.3)

## 2012-05-02 LAB — PROTIME-INR
INR: 0.93 (ref 0.00–1.49)
Prothrombin Time: 12.7 seconds (ref 11.6–15.2)

## 2012-05-02 LAB — CBC
Hemoglobin: 14.3 g/dL (ref 12.0–15.0)
MCH: 29.5 pg (ref 26.0–34.0)
MCHC: 34.4 g/dL (ref 30.0–36.0)
RDW: 15.7 % — ABNORMAL HIGH (ref 11.5–15.5)

## 2012-05-02 LAB — PRO B NATRIURETIC PEPTIDE: Pro B Natriuretic peptide (BNP): 746.4 pg/mL — ABNORMAL HIGH (ref 0–450)

## 2012-05-02 LAB — TROPONIN I: Troponin I: <0.3 ng/mL (ref <0.30)

## 2012-05-02 MED ORDER — SODIUM CHLORIDE 0.9 % IV SOLN
Freq: Once | INTRAVENOUS | Status: AC
  Administered 2012-05-02: 22:00:00 via INTRAVENOUS

## 2012-05-02 NOTE — ED Notes (Signed)
Patient with shortness of breath at rest, mild in nature.  Patient is CAOx3, appropriate.

## 2012-05-02 NOTE — ED Provider Notes (Signed)
History     CSN: [id number]  Arrival date & time 05/02/12  2107   First MD Initiated Contact with Patient 05/02/12 2108      Chief Complaint  Patient presents with  . Tachycardia  . Atrial Fibrillation  . Palpitations    (Consider location/radiation/quality/duration/timing/severity/associated sxs/prior treatment) Patient is a 78 y.o. female presenting with palpitations. The history is provided by the patient.  Palpitations  This is a new problem. The current episode started 3 to 5 hours ago. The problem occurs constantly. The problem has not changed since onset.The problem is associated with an unknown factor. On average, each episode lasts 3 hours. Associated symptoms include shortness of breath. Pertinent negatives include no fever, no chest pain, no abdominal pain, no nausea, no vomiting, no headaches, no back pain, no dizziness and no cough. She has tried nothing for the symptoms. The treatment provided no relief. Risk factors: CAD, tobacco abuse, DM.    Past Medical History  Diagnosis Date  . CVA (cerebral vascular accident)   . CAD (coronary artery disease)   . Vitamin d deficiency   . Diabetes mellitus, type 2   . HTN (hypertension)   . HLD (hyperlipidemia)   . Tremor, essential   . Urinary incontinence   . DIABETES-TYPE 2 07/16/2007  . HYPERLIPIDEMIA 03/03/2007  . TOBACCO USER 12/25/2009  . HYPERTENSION, ESSENTIAL NOS 08/07/2007  . CAROTID ARTERY DISEASE 07/16/2007  . C V A/STROKE 07/16/2007  . Palpitations 02/08/2010  . High risk social situations 11/28/2011  . Intention tremor 03/03/2007    Qualifier: Diagnosis of  By: Dorathy Daft  MD, IDAYLIS    . Syncope, near 02/28/2012  . Altered mental status 01/01/2011    Qualifier: Diagnosis of  By: Earnest Bailey MD, Selena Batten    . DEFICIENCY, VITAMIN D NOS 07/16/2007    Qualifier: Diagnosis of  By: Irving Burton MD, Clifton Custard      Past Surgical History  Procedure Date  . Appendectomy 1965  . Vaginal hysterectomy 1965  . Knee surgery 1989, 1995      Family History  Problem Relation Age of Onset  . Diabetes Mother   . Diabetes Brother   . Stroke Father     History  Substance Use Topics  . Smoking status: Current Everyday Smoker -- 0.5 packs/day    Types: Cigarettes  . Smokeless tobacco: Not on file   Comment: does not smoke much...  . Alcohol Use: No    OB History    Grav Para Term Preterm Abortions TAB SAB Ect Mult Living                  Review of Systems  Constitutional: Negative for fever and fatigue.  HENT: Negative for congestion, drooling and neck pain.   Eyes: Negative for pain.  Respiratory: Positive for shortness of breath. Negative for cough.   Cardiovascular: Positive for palpitations. Negative for chest pain.  Gastrointestinal: Negative for nausea, vomiting, abdominal pain and diarrhea.  Genitourinary: Negative for dysuria and hematuria.  Musculoskeletal: Negative for back pain and gait problem.  Skin: Negative for color change.  Neurological: Negative for dizziness and headaches.  Hematological: Negative for adenopathy.  Psychiatric/Behavioral: Negative for behavioral problems.  All other systems reviewed and are negative.    Allergies  Codeine and Penicillins  Home Medications   Current Outpatient Rx  Name Route Sig Dispense Refill  . ACETAMINOPHEN ER 650 MG PO TBCR Oral Take 1 tablet (650 mg total) by mouth every 8 (eight) hours. For arthritis pain.  90 tablet 5  . ASPIRIN 81 MG PO CHEW Oral Chew 81 mg by mouth daily.    [name] BLACK COHOSH 160 MG PO CAPS Oral Take 160 mg by mouth daily.     [name] CALCIUM PO Oral Take 1 tablet by mouth 2 (two) times daily.     . B-12 100 MCG PO TABS Oral Take 100 mcg by mouth daily.     [name] GINKGO BILOBA 120 MG PO CAPS Oral Take 120 mg by mouth daily.     [name] LISINOPRIL 5 MG PO TABS Oral Take 1 tablet (5 mg total) by mouth daily. 30 tablet 0  . METOPROLOL TARTRATE 25 MG PO TABS Oral Take 1 tablet (25 mg total) by mouth 2 (two) times daily. 60 tablet 5  . B-6 100 MG PO  TABS Oral Take 100 mg by mouth daily.       BP 133/87  Pulse 72  Temp(Src) 99 F (37.2 C) (Oral)  Resp 21  SpO2 98%  Physical Exam  Constitutional: She is oriented to person, place, and time. She appears well-developed and well-nourished.  HENT:  Head: Normocephalic.  Mouth/Throat: No oropharyngeal exudate.  Eyes: Conjunctivae and EOM are normal. Pupils are equal, round, and reactive to light.  Neck: Normal range of motion. Neck supple.  Cardiovascular: Intact distal pulses.  Exam reveals no gallop and no friction rub.   No murmur heard.      Tachycardic  Pulmonary/Chest: Breath sounds normal. She is in respiratory distress (mild tachypnea). She has no wheezes.  Abdominal: Soft. Bowel sounds are normal. There is no tenderness.  Musculoskeletal: Normal range of motion. She exhibits no edema and no tenderness.  Neurological: She is alert and oriented to person, place, and time.  Skin: Skin is warm and dry.  Psychiatric: She has a normal mood and affect. Her behavior is normal.    ED Course  Procedures (including critical care time)  Labs Reviewed  CBC - Abnormal; Notable for the following:    RDW 15.7 (*)    All other components within normal limits  DIFFERENTIAL - Abnormal; Notable for the following:    Neutrophils Relative 83 (*)    Neutro Abs 8.4 (*)    Lymphocytes Relative 8 (*)    All other components within normal limits  COMPREHENSIVE METABOLIC PANEL - Abnormal; Notable for the following:    Glucose, Bld 207 (*)    BUN 25 (*)    Creatinine, Ser 1.22 (*)    Total Bilirubin 0.2 (*)    GFR calc non Af Amer 38 (*)    GFR calc Af Amer 44 (*)    All other components within normal limits  PRO B NATRIURETIC PEPTIDE - Abnormal; Notable for the following:    Pro B Natriuretic peptide (BNP) 746.4 (*)    All other components within normal limits  URINALYSIS, ROUTINE W REFLEX MICROSCOPIC - Abnormal; Notable for the following:    APPearance HAZY (*)    All other components  within normal limits  PROTIME-INR  TROPONIN I  CBC  CREATININE, SERUM  BASIC METABOLIC PANEL  CBC  TSH  MAGNESIUM   Dg Chest Port 1 View  05/02/2012  *RADIOLOGY REPORT*  Clinical Data: Chronic irregular heart beat.  Shortness of breath. Chest pain.  PORTABLE CHEST - 1 VIEW  Comparison: 02/27/2012  Findings: The thoracolumbar scoliosis.  Normal heart size and pulmonary vascularity.  Hyperinflation and scattered fibrosis suggesting emphysematous changes.  No focal airspace consolidation in the lungs.  No blunting of costophrenic angles.  No pneumothorax.  The calcified and tortuous aorta.  Degenerative changes in the shoulders.  No significant change since previous study.  IMPRESSION: Emphysematous changes and scattered fibrosis in the lungs.  No evidence of active pulmonary disease.  Original Report Authenticated By: Marlon Pel, M.D.     1. Atrial tachycardia, multifocal   2. Shortness of breath   3. HYPERTENSION, ESSENTIAL NOS       MDM  1:10 AM 78 y.o. female w hx of CAD, HTN, HLD, DM, CVA pw sudden onset palpitations that began approx 3 hours ago. Pt states she was at home getting undressed to get in the shower when she had sudden onset of sx. Pt denies cp, does not mild sob. EMS notes initial HR  In the 170-190's, but HR slowed down to 120-130's before any medicine was given. Pt presents w/ HR in 130's. Pt otherise AFVSS, and appears well on exam. ECG shows MAT, no evidence of ischemia. Will get labs, IVF, CXR.   Interpreted/reviewed labs, pt has slightly elev BNP and Cr. HR has dec spontaneously w/ IVF hydration, now HR in the 90's.  Discussed case w/ Family Practice. Will admit to their service.   Clinical Impression 1. Atrial tachycardia, multifocal   2. Shortness of breath   3. HYPERTENSION, ESSENTIAL NOS            Purvis Sheffield, MD 05/03/12 0110  Purvis Sheffield, MD 05/03/12 1610

## 2012-05-02 NOTE — H&P (Signed)
[name] is an 78 y.o. female.   Chief Complaint: tachycardia HPI: Spry 78 yo female with extensive PMHx including HTN, DM, CAD, dCHF, h/o CVA who presents with tachycardia (EKG showing multifocal atrial tachycardia), with HR as high as 180s-190s when EMS arrived at her home today.  She reports she was doing nothing, just sitting around, when she felt some palpitations around 11am, which she initially ignored.  This was sudden onset in nature.  When they persisted for a few hours, she called EMS.  She had some chest discomfort with this but no chest pain.  Denies any syncope, but she did have SOB and dizziness prior to EMS arriving, which resolved as HR slowed.  She has no known history of atrial fibrillation or MAT but does report having palpitations 1 other time, in January of this year.  She does take metoprolol.  She was recently admitted 02/2012 for a syncopal episode, but had only PACs and PVCs on telemetry and EKGs.  Patient does have h/o palpatations in her PMHx from 2011.  She is currently asymptomatic, without any chest pain, dizziness, SOB, swelling, etc. Does endorse having some decreased appetite over the past few days, but no N/V/D or abdominal pain.   While in the ED, patient was given some IVF and heart rate spontaneously returned to normal.    Past Medical History  Diagnosis Date  . CVA (cerebral vascular accident)   . CAD (coronary artery disease)   . Vitamin d deficiency   . Diabetes mellitus, type 2   . HTN (hypertension)   . HLD (hyperlipidemia)   . Tremor, essential   . Urinary incontinence   . DIABETES-TYPE 2 07/16/2007  . HYPERLIPIDEMIA 03/03/2007  . TOBACCO USER 12/25/2009  . HYPERTENSION, ESSENTIAL NOS 08/07/2007  . CAROTID ARTERY DISEASE 07/16/2007  . C V A/STROKE 07/16/2007  . Palpitations 02/08/2010  . High risk social situations 11/28/2011  . Intention tremor 03/03/2007    Qualifier: Diagnosis of  By: Dorathy Daft  MD, IDAYLIS    . Syncope, near 02/28/2012  .  Altered mental status 01/01/2011    Qualifier: Diagnosis of  By: Earnest Bailey MD, Selena Batten    . DEFICIENCY, VITAMIN D NOS 07/16/2007    Qualifier: Diagnosis of  By: Irving Burton MD, Clifton Custard      Past Surgical History  Procedure Date  . Appendectomy 1965  . Vaginal hysterectomy 1965  . Knee surgery 1989, 1995    Family History  Problem Relation Age of Onset  . Diabetes Mother   . Diabetes Brother   . Stroke Father    Social History:  reports that she has been smoking Cigarettes.  She has been smoking about .5 packs per day. She does not have any smokeless tobacco history on file. She reports that she does not drink alcohol or use illicit drugs. Lives at home, cooks/cleans for herself; has friends that drive her around  Allergies:  Allergies  Allergen Reactions  . Codeine     Unsure of reaction  . Penicillins     Unsure of reaction      (Not in a hospital admission)  Results for orders placed during the hospital encounter of 05/02/12 (from the past 48 hour(s))  CBC     Status: Abnormal   Collection Time   05/02/12  9:21 PM      Component Value Range Comment   WBC 10.1  4.0 - 10.5 (K/uL)    RBC 4.85  3.87 - 5.11 (MIL/uL)  Hemoglobin 14.3  12.0 - 15.0 (g/dL)    HCT 16.1  09.6 - 04.5 (%)    MCV 85.8  78.0 - 100.0 (fL)    MCH 29.5  26.0 - 34.0 (pg)    MCHC 34.4  30.0 - 36.0 (g/dL)    RDW 40.9 (*) 81.1 - 15.5 (%)    Platelets 325  150 - 400 (K/uL)   DIFFERENTIAL     Status: Abnormal   Collection Time   05/02/12  9:21 PM      Component Value Range Comment   Neutrophils Relative 83 (*) 43 - 77 (%)    Neutro Abs 8.4 (*) 1.7 - 7.7 (K/uL)    Lymphocytes Relative 8 (*) 12 - 46 (%)    Lymphs Abs 0.8  0.7 - 4.0 (K/uL)    Monocytes Relative 8  3 - 12 (%)    Monocytes Absolute 0.8  0.1 - 1.0 (K/uL)    Eosinophils Relative 0  0 - 5 (%)    Eosinophils Absolute 0.0  0.0 - 0.7 (K/uL)    Basophils Relative 0  0 - 1 (%)    Basophils Absolute 0.0  0.0 - 0.1 (K/uL)   COMPREHENSIVE METABOLIC PANEL      Status: Abnormal   Collection Time   05/02/12  9:21 PM      Component Value Range Comment   Sodium 141  135 - 145 (mEq/L)    Potassium 4.3  3.5 - 5.1 (mEq/L)    Chloride 104  96 - 112 (mEq/L)    CO2 24  19 - 32 (mEq/L)    Glucose, Bld 207 (*) 70 - 99 (mg/dL)    BUN 25 (*) 6 - 23 (mg/dL)    Creatinine, Ser 9.14 (*) 0.50 - 1.10 (mg/dL)    Calcium 78.2  8.4 - 10.5 (mg/dL)    Total Protein 7.6  6.0 - 8.3 (g/dL)    Albumin 4.0  3.5 - 5.2 (g/dL)    AST 25  0 - 37 (U/L)    ALT 19  0 - 35 (U/L)    Alkaline Phosphatase 62  39 - 117 (U/L)    Total Bilirubin 0.2 (*) 0.3 - 1.2 (mg/dL)    GFR calc non Af Amer 38 (*) >90 (mL/min)    GFR calc Af Amer 44 (*) >90 (mL/min)   PROTIME-INR     Status: Normal   Collection Time   05/02/12  9:21 PM      Component Value Range Comment   Prothrombin Time 12.7  11.6 - 15.2 (seconds)    INR 0.93  0.00 - 1.49    TROPONIN I     Status: Normal   Collection Time   05/02/12  9:31 PM      Component Value Range Comment   Troponin I <0.30  <0.30 (ng/mL)   PRO B NATRIURETIC PEPTIDE     Status: Abnormal   Collection Time   05/02/12  9:31 PM      Component Value Range Comment   Pro B Natriuretic peptide (BNP) 746.4 (*) 0 - 450 (pg/mL)    Dg Chest Port 1 View  05/02/2012  *RADIOLOGY REPORT*  Clinical Data: Chronic irregular heart beat.  Shortness of breath. Chest pain.  PORTABLE CHEST - 1 VIEW  Comparison: 02/27/2012  Findings: The thoracolumbar scoliosis.  Normal heart size and pulmonary vascularity.  Hyperinflation and scattered fibrosis suggesting emphysematous changes.  No focal airspace consolidation in the lungs.  No blunting of costophrenic angles.  No pneumothorax.  The calcified and tortuous aorta.  Degenerative changes in the shoulders.  No significant change since previous study.  IMPRESSION: Emphysematous changes and scattered fibrosis in the lungs.  No evidence of active pulmonary disease.  Original Report Authenticated By: Marlon Pel, M.D.     Review of Systems  Constitutional: Negative for fever, chills, malaise/fatigue and diaphoresis.  HENT: Negative for congestion and sore throat.   Eyes: Negative for blurred vision and double vision.  Respiratory: Positive for shortness of breath. Negative for cough, hemoptysis and wheezing.   Cardiovascular: Positive for palpitations. Negative for chest pain, claudication and leg swelling.  Gastrointestinal: Negative for heartburn, nausea, vomiting, abdominal pain, diarrhea, constipation, blood in stool and melena.  Genitourinary: Negative for dysuria.  Musculoskeletal: Negative for myalgias and falls.  Skin: Negative for rash.  Neurological: Positive for dizziness. Negative for tingling, focal weakness, weakness and headaches.    Blood pressure 148/92, pulse 95, temperature 99 F (37.2 C), temperature source Oral, resp. rate 18, SpO2 99.00%. Physical Exam  Constitutional: She is oriented to person, place, and time. She appears well-developed and well-nourished. No distress.  HENT:  Head: Normocephalic and atraumatic.  Nose: Nose normal.  Mouth/Throat: Oropharynx is clear and moist. No oropharyngeal exudate.  Eyes: Conjunctivae and EOM are normal. Pupils are equal, round, and reactive to light.  Neck: Normal range of motion. Neck supple. No thyromegaly present.  Cardiovascular: Normal heart sounds and intact distal pulses.  An irregularly irregular rhythm present. Tachycardia present.   No murmur heard. Respiratory: Effort normal and breath sounds normal. No respiratory distress. She has no wheezes. She has no rales.  GI: Soft. Bowel sounds are normal. She exhibits no distension. There is no tenderness.  Musculoskeletal: Normal range of motion. She exhibits no edema.  Lymphadenopathy:    She has no cervical adenopathy.  Neurological: She is alert and oriented to person, place, and time.  Skin: Skin is warm and dry. She is not diaphoretic. No erythema.  Psychiatric: She has a  normal mood and affect.     Assessment/Plan 78 yo F with multiple medical problems p/w sudden onset palpatations and tachycardia.  1. Tachycardia/palpitations: currently regular rate (90s); had similar admission 12/2011 with workup including normal A1c, TSH, ECHO with diastolic dysfunction  -EKG in ED showing possible MAT (HR 111, irregular) -continue home metoprolol -monitor on telemetry overnight -check A1c and TSH -f/u AM BMET, Mag and EKG  2. HTN/diastolic CHF/CAD: currently normotensive; ECHO 12/2011 showing diastolic dysfunction, EF 55-60% -continue home metoprolol and lisinopril  3. DM: no home medications -will monitor and do sensitive SSI   4. Tobacco abuse: pt reports smoking < 1/2 ppd -pt denies nicotine patch  FEN/GI: NS @ 50, regular diet  Ppx: Lovenox  Dispo: monitor on tele overnight; possible d/c tomorrow if remains asymptomatic (pt would like to be here for as short of a period as possible)  Javar Eshbach 05/02/2012, 11:53 PM

## 2012-05-02 NOTE — ED Provider Notes (Signed)
78 year old female has been having palpitations for 3 hours. Still would go away but when it didn't she called EMS who noted heart rate has been as high as 190. ECG in the ambulance is suggestive of atrial fibrillation. Heart rate has been markedly variable as low as about 100 and as high as 190. On arrival in the emergency department, which she is noted to be in multifocal atrial tachycardia with occasional bursts of heart rate is high as 140 but mostly in the range between 100-120. She denies chest pain or dyspnea. Exam is unremarkable except for irregular heart rate.   Date: 05/02/2012  Rate: 111  Rhythm: premature ventricular contractions (PVC) and Multifocal atrial tachycardia  QRS Axis: normal  Intervals: normal  ST/T Wave abnormalities: normal  Conduction Disutrbances:none  Narrative Interpretation: Low voltage, multifocal atrial tachycardia. When compared with ECG of 02/28/2012, multifocal atrial tachycardia has replaced sinus rhythm with PACs.  Old EKG Reviewed: changes noted    Dione Booze, MD 05/02/12 2124

## 2012-05-02 NOTE — ED Notes (Signed)
Per ems- pt c/o chest palpitations. HR ranging from 130-230. Currently HR in 180s. Pt denies any cp, n/v. Pt was SOB upon arrival. 3L Nasal cannula helped resolve SOB

## 2012-05-03 ENCOUNTER — Encounter (HOSPITAL_COMMUNITY): Payer: Self-pay | Admitting: *Deleted

## 2012-05-03 DIAGNOSIS — R5381 Other malaise: Secondary | ICD-10-CM

## 2012-05-03 DIAGNOSIS — I456 Pre-excitation syndrome: Secondary | ICD-10-CM

## 2012-05-03 DIAGNOSIS — R5383 Other fatigue: Secondary | ICD-10-CM

## 2012-05-03 LAB — HEMOGLOBIN A1C
Hgb A1c MFr Bld: 7 % — ABNORMAL HIGH (ref <5.7)
Mean Plasma Glucose: 154 mg/dL — ABNORMAL HIGH (ref <117)

## 2012-05-03 LAB — CBC
HCT: 36.1 % (ref 36.0–46.0)
HCT: 34.6 % — ABNORMAL LOW (ref 36.0–46.0)
MCH: 28.2 pg (ref 26.0–34.0)
MCH: 28.3 pg (ref 26.0–34.0)
MCV: 84.1 fL (ref 78.0–100.0)
MCV: 85.2 fL (ref 78.0–100.0)
Platelets: 278 10*3/uL (ref 150–400)
Platelets: 273 10*3/uL (ref 150–400)
RBC: 4.06 MIL/uL (ref 3.87–5.11)
RDW: 15.8 % — ABNORMAL HIGH (ref 11.5–15.5)
WBC: 9.8 10*3/uL (ref 4.0–10.5)

## 2012-05-03 LAB — BASIC METABOLIC PANEL
CO2: 24 mEq/L (ref 19–32)
Calcium: 8.9 mg/dL (ref 8.4–10.5)
Chloride: 111 mEq/L (ref 96–112)
Creatinine, Ser: 1.02 mg/dL (ref 0.50–1.10)
Glucose, Bld: 95 mg/dL (ref 70–99)
Sodium: 145 mEq/L (ref 135–145)

## 2012-05-03 LAB — URINALYSIS, ROUTINE W REFLEX MICROSCOPIC
Ketones, ur: NEGATIVE mg/dL
Leukocytes, UA: NEGATIVE
Nitrite: NEGATIVE
Protein, ur: NEGATIVE mg/dL

## 2012-05-03 LAB — GLUCOSE, CAPILLARY: Glucose-Capillary: 107 mg/dL — ABNORMAL HIGH (ref 70–99)

## 2012-05-03 MED ORDER — ACETAMINOPHEN 650 MG RE SUPP: 650.0000 mg | Freq: Four times a day (QID) | RECTAL | Status: DC | PRN

## 2012-05-03 MED ORDER — ASPIRIN 81 MG PO CHEW
81.0000 mg | CHEWABLE_TABLET | Freq: Every day | ORAL | Status: DC
Start: 2012-05-03 — End: 2012-05-04
  Administered 2012-05-03 – 2012-05-04 (×2): 81 mg via ORAL
  Filled 2012-05-03 (×2): qty 1

## 2012-05-03 MED ORDER — ACETAMINOPHEN 325 MG PO TABS: 650.0000 mg | ORAL_TABLET | Freq: Four times a day (QID) | ORAL | Status: DC | PRN

## 2012-05-03 MED ORDER — METOPROLOL TARTRATE 25 MG PO TABS
25.0000 mg | ORAL_TABLET | Freq: Two times a day (BID) | ORAL | Status: DC
  Administered 2012-05-03 – 2012-05-04 (×4): 25 mg via ORAL
  Filled 2012-05-03 (×5): qty 1

## 2012-05-03 MED ORDER — SODIUM CHLORIDE 0.9 % IV SOLN
INTRAVENOUS | Status: DC
  Administered 2012-05-03 (×2): via INTRAVENOUS

## 2012-05-03 MED ORDER — LISINOPRIL 5 MG PO TABS
5.0000 mg | ORAL_TABLET | Freq: Every day | ORAL | Status: DC
  Administered 2012-05-03 – 2012-05-04 (×2): 5 mg via ORAL
  Filled 2012-05-03 (×2): qty 1

## 2012-05-03 MED ORDER — INSULIN ASPART 100 UNIT/ML ~~LOC~~ SOLN
0.0000 [IU] | Freq: Three times a day (TID) | SUBCUTANEOUS | Status: DC
  Administered 2012-05-03: 2 [IU] via SUBCUTANEOUS

## 2012-05-03 MED ORDER — ENOXAPARIN SODIUM 40 MG/0.4ML ~~LOC~~ SOLN
40.0000 mg | SUBCUTANEOUS | Status: DC
  Administered 2012-05-03 – 2012-05-04 (×2): 40 mg via SUBCUTANEOUS
  Filled 2012-05-03 (×2): qty 0.4

## 2012-05-03 MED ORDER — SODIUM CHLORIDE 0.9 % IJ SOLN
3.0000 mL | Freq: Two times a day (BID) | INTRAMUSCULAR | Status: DC
  Administered 2012-05-03 – 2012-05-04 (×3): 3 mL via INTRAVENOUS

## 2012-05-03 NOTE — Progress Notes (Signed)
Patient ID: [name], female   DOB: 01-30-1920, 78 y.o.   MRN: [id number]  Visited patient and informed her that TSH and A1c were within normal limits.  Patient states that she feels "woozy" and tired; feels like she has no energy.  I told patient that her HR was back to normal as well.  Patient says she prefers to stay in hospital overnight.  She does not have a ride home and probably will not have one tomorrow.  PLAN: Anticipate discharge to home tomorrow in AM, consult Case Management for transportation.  Consider starting SSRI for depression.  Will defer to PCP.  DE LA CRUZ,Kinte Trim 05/03/12 5:10 PM

## 2012-05-03 NOTE — ED Notes (Signed)
Attempted to call report.  RN unable to come to phone.  Phone number and name given.

## 2012-05-03 NOTE — ED Notes (Signed)
Admitting MD at bedside.

## 2012-05-03 NOTE — ED Provider Notes (Signed)
I saw and evaluated the patient, reviewed the resident's note and I agree with the findings and plan.   Travin Marik, MD 05/03/12 1459 

## 2012-05-03 NOTE — H&P (Signed)
I interviewed and examined this patient and discussed the care plan with Dr. Fara Boros and the [facility] team and agree with assessment and plan as documented in the admission note for yesterday.    Jonell Brumbaugh A. Sheffield Slider, MD Family Medicine Teaching Service Attending  05/03/2012 3:34 PM

## 2012-05-04 LAB — GLUCOSE, CAPILLARY
Glucose-Capillary: 131 mg/dL — ABNORMAL HIGH (ref 70–99)
Glucose-Capillary: 104 mg/dL — ABNORMAL HIGH (ref 70–99)

## 2012-05-04 MED ORDER — ENSURE COMPLETE PO LIQD: 237.0000 mL | Freq: Two times a day (BID) | ORAL | Status: DC

## 2012-05-04 NOTE — Progress Notes (Signed)
Went over all discharge info with pt including home meds and follow up appts. Pt does not currently have a ride home am calling neighbors for her. Neighbor Lenor Coffin says she will take her home. Pt dressed and ready. Marisa Cyphers RN

## 2012-05-04 NOTE — Progress Notes (Signed)
FMTS Attending Note  Patient seen and examined today by me, discussed with resident team.  Patient has had no tachyarrhythmias on telemetry monitor overnight.  She is getting bathed this morning and reports feeling well.  Somewhat vague and frontal in her responses to my questions this morning. To assess MMSE and physical therapy consult regarding needs once discharged.  Paula Compton, MD

## 2012-05-04 NOTE — Evaluation (Signed)
Physical Therapy Evaluation Patient Details Name: [name] MRN: [id number] DOB: 08-27-1920 Today's Date: 05/04/2012 Time: 4098-1191 PT Time Calculation (min): 25 min  PT Assessment / Plan / Recommendation Clinical Impression  pt admitted with tachycardia.  Now resolved, pt's gait unsteady with cane and pt cane benefit from HHPT    PT Assessment  Patient needs continued PT services    Follow Up Recommendations  Home health PT    Barriers to Discharge        lEquipment Recommendations  Cane    Recommendations for Other Services     Frequency Min 3X/week    Precautions / Restrictions Precautions Precautions: Fall Restrictions Weight Bearing Restrictions: No   Pertinent Vitals/Pain       Mobility  Bed Mobility Bed Mobility: Supine to Sit;Sitting - Scoot to Edge of Bed;Sit to Supine Supine to Sit: 7: Independent Sitting - Scoot to Edge of Bed: 7: Independent Sit to Supine: 7: Independent Transfers Transfers: Sit to Stand;Stand to Sit Sit to Stand: 5: Supervision Stand to Sit: 5: Supervision Details for Transfer Assistance: safety only Ambulation/Gait Ambulation/Gait Assistance: 4: Min assist Ambulation Distance (Feet): 180 Feet Assistive device: Straight cane;Other (Comment);1 person hand held assist (or rail) Ambulation/Gait Assistance Details: Unsteady short, choppy steps with cane and either min HHA or rail Gait Pattern: Step-through pattern;Decreased step length - right;Decreased step length - left;Decreased stride length;Wide base of support (unsteady gait throughout) Stairs: No Wheelchair Mobility Wheelchair Mobility: No    Exercises     PT Diagnosis: Difficulty walking;Generalized weakness  PT Problem List: Decreased strength;Decreased activity tolerance;Decreased balance;Decreased mobility PT Treatment Interventions: Gait training;DME instruction;Stair training;Functional mobility training;Therapeutic activities;Balance training;Patient/family  education   PT Goals Acute Rehab PT Goals PT Goal Formulation: With patient Time For Goal Achievement: 05/04/12 Potential to Achieve Goals: Good Pt will go Sit to Stand: with modified independence PT Goal: Sit to Stand - Progress: Goal set today Pt will Transfer Bed to Chair/Chair to Bed: with modified independence PT Transfer Goal: Bed to Chair/Chair to Bed - Progress: Goal set today Pt will Ambulate: 51 - 150 feet;with modified independence;with least restrictive assistive device PT Goal: Ambulate - Progress: Goal set today Pt will Go Up / Down Stairs: 3-5 stairs;with modified independence;with rail(s) PT Goal: Up/Down Stairs - Progress: Goal set today  Visit Information  Last PT Received On: 05/04/12 Assistance Needed: +1    Subjective Data  Patient Stated Goal: get home   Prior Functioning  Home Living Lives With: Alone Available Help at Discharge: Friend(s) Type of Home: House Home Access: Stairs to enter [employer] of Steps: 3 Entrance Stairs-Rails: Right;Left Home Layout: One level;Able to live on main level with bedroom/bathroom Home Adaptive Equipment: Straight cane;Crutches Prior Function Level of Independence: Independent with assistive device(s) Able to Take Stairs?: Yes Driving: No Communication Communication: No difficulties    Cognition  Overall Cognitive Status: Appears within functional limits for tasks assessed/performed Arousal/Alertness: Awake/alert Orientation Level: Appears intact for tasks assessed Behavior During Session: [facility] for tasks performed    Extremity/Trunk Assessment Right Lower Extremity Assessment RLE ROM/Strength/Tone: Within functional levels Left Lower Extremity Assessment LLE ROM/Strength/Tone: Within functional levels (bil general weakness at >4/5) Trunk Assessment Trunk Assessment: Normal   Balance Balance Balance Assessed: No  End of Session PT - End of Session Activity Tolerance: Patient tolerated treatment  well Patient left: with call bell/phone within reach (sitting EOB) Nurse Communication: Mobility status   Prajna Vanderpool, Eliseo Gum 05/04/2012, 5:46 PM  05/04/2012  Byron Bing, PT  [phone] (pager)

## 2012-05-04 NOTE — Discharge Instructions (Signed)
Please follow up with Dr. Mikel Cella on Friday May 24 @ 3:00PM.  Arrhythmia Categories Your heart is a muscle that works to pump blood through your body by regular contractions. The beating of your heart is controlled by a system of special pacemaker cells. These cells control the electrical activity of the heart. When the system controlling this regular beating is disturbed, a heart rhythm abnormality (arrhythmia) results. WHEN YOUR HEART SKIPS A BEAT One of the most common and least serious heart arrhythmias is called an ectopic or premature atrial heartbeat (PAC). This may be noticed as a small change in your regular pulse. A PAC originates from the top part (atrium) of the heart. Within the right atrium, the SA node is the area that normally controls the regularity of the heart. PACs occur in heart tissue outside of the SA node region. You may feel this as a skipped beat or heart flutter, especially if several occur in succession or occur frequently.  Another arrhythmia is ventricular premature complex (VCP or PVC). These extra beats start out in the bottom, more muscular chambers of the heart. In most cases a PVC is harmless. If there are underlying causes that are making the heart irritable such as an overactive thyroid or a prior heart attack PVCs may be of more concern. In a few cases, medications to control the heart rhythm may be prescribed. Things to try at home:  Cut down or avoid alcohol, tobacco and caffeine.   Get enough sleep.   Reduce stress.   Exercise more.  WHEN THE HEART BEATS TOO FAST Atrial tachycardia is a fast heart rate, which starts out in the atrium. It may last from minutes to much longer. Your heart may beat 140 to 240 times per minute instead of the normal 60 to 100.  Symptoms include a worried feeling (anxiety) and a sense that your heart is beating fast and hard.   You may be able to stop the fast rate by holding your breath or bearing down as if you were going to  have a bowel movement.   This type of fast rate is usually not dangerous.  Atrial fibrillation and atrial flutter are other fast rhythms that start in the atria. Both conditions keep the atria from filling with enough blood so the heart does not work well.  Symptoms include feeling lightheaded or faint.   These fast rates may be the result of heart damage or disease. Too much thyroid hormone may play a role.   There may be no clear cause or it may be from heart disease or damage.   Medication or a special electrical treatment (cardioversion) may be needed to get the heart beating normally.  Ventricular tachycardia is a fast heart rate that starts in the lower muscular chambers (ventricles) This is a serious disorder that requires treatment as soon as possible. You need someone else to get and use a small defibrillator.  Symptoms include collapse, chest pain, or being short of breath.   Treatment may include medication, procedures to improve blood flow to the heart, or an implantable cardiac defibrillator (ICD).  DIAGNOSIS   A cardiogram (EKG or ECG) will be done to see the arrhythmia, as well as lab tests to check the underlying cause.   If the extra beats or fast rate come and go, you may wear a Holter monitor that records your heart rate for a longer period of time.  HOME CARE INSTRUCTIONS  If your caregiver has given you a  follow-up appointment, it is very important to keep that appointment. Not keeping the appointment could result in a heart attack, permanent injury, pain, and disability. If there is any problem keeping the appointment, you must call back to this facility for assistance.  SEEK MEDICAL CARE IF:  You have irregular or fast heartbeats (palpitations).   You experience skipped beats.   You develop lightheadedness.   You have chest discomfort.   You develop shortness of breath.   You have more frequent episodes, if you are already being treated.  SEEK IMMEDIATE  MEDICAL CARE IF:   You have severe chest pain, especially if the pain is crushing or pressure-like and spreads to the arms, back, neck, or jaw, or if you have sweating, feeling sick to your stomach (nausea), or shortness of breath. THIS IS AN EMERGENCY. Do not wait to see if the pain will go away. Get medical help at once. Call 911 or 0 (operator). DO NOT drive yourself to the hospital.   You feel dizzy or faint.   You have episodes of previously documented atrial tachycardia that do not resolve with the techniques your caregiver has taught you.   Irregular or rapid heartbeats begin to occur more often than in the past, especially if they are associated with more pronounced symptoms or of longer duration.  Document Released: 12/09/2005 Document Revised: 11/28/2011 Document Reviewed: 04/23/2007 [facility] Patient Information 2012 [place], [place].

## 2012-05-04 NOTE — Progress Notes (Signed)
INITIAL ADULT NUTRITION ASSESSMENT Date: 05/04/2012   Time: 4:13 PM Reason for Assessment: Nutrition risk, unintentional weight loss  ASSESSMENT: Female 78 y.o.  Dx: Tachycardia  Hx:  Past Medical History  Diagnosis Date  . CVA (cerebral vascular accident)   . CAD (coronary artery disease)   . Vitamin d deficiency   . Diabetes mellitus, type 2   . HTN (hypertension)   . HLD (hyperlipidemia)   . Tremor, essential   . Urinary incontinence   . DIABETES-TYPE 2 07/16/2007  . HYPERLIPIDEMIA 03/03/2007  . TOBACCO USER 12/25/2009  . HYPERTENSION, ESSENTIAL NOS 08/07/2007  . CAROTID ARTERY DISEASE 07/16/2007  . C V A/STROKE 07/16/2007  . Palpitations 02/08/2010  . High risk social situations 11/28/2011  . Intention tremor 03/03/2007    Qualifier: Diagnosis of  By: Dorathy Daft  MD, IDAYLIS    . Syncope, near 02/28/2012  . Altered mental status 01/01/2011    Qualifier: Diagnosis of  By: Earnest Bailey MD, Selena Batten    . DEFICIENCY, VITAMIN D NOS 07/16/2007    Qualifier: Diagnosis of  By: Irving Burton MD, Clifton Custard    . Myocardial infarction   . Angina   . Heart murmur   . Asthma   . Shortness of breath   . Pneumonia   . Recurrent upper respiratory infection (URI)   . Hyperthyroidism   . Anemia   . Chronic kidney disease   . H/O hiatal hernia   . Seizures   . Headache   . Neuromuscular disorder   . Arthritis   . Fibromyalgia     Related Meds:     . aspirin  81 mg Oral Daily  . enoxaparin  40 mg Subcutaneous Q24H  . insulin aspart  0-9 Units Subcutaneous TID WC  . lisinopril  5 mg Oral Daily  . metoprolol tartrate  25 mg Oral BID  . sodium chloride  3 mL Intravenous Q12H     Ht: 5\' 4"  (162.6 cm)  Wt: 108 lb 6.4 oz (49.17 kg) (Scale A)  Ideal Wt: 54.5 kg % Ideal Wt: 90%  Usual Wt: 112 lbs Pt was 104 lbs on admission Wt Readings from Last 10 Encounters:  05/04/12 108 lb 6.4 oz (49.17 kg)  03/19/12 110 lb (49.896 kg)  03/12/12 110 lb (49.896 kg)  03/02/12 110 lb 3.7 oz (50 kg)  01/02/12  112 lb (50.803 kg)  12/31/11 109 lb 12.7 oz (49.803 kg)  11/28/11 109 lb (49.442 kg)  01/08/11 114 lb 12.8 oz (52.073 kg)  01/01/11 115 lb 6.4 oz (52.345 kg)  03/08/10 120 lb (54.432 kg)    % Usual Wt: 96%  Body mass index is 18.61 kg/(m^2). Pt is underweight  Food/Nutrition Related Hx: Pt reports decreased intake, only 2 meals daily, with normal appetite. No problems with chewing or swallowing.   Labs:  CMP     Component Value Date/Time   NA 145 05/03/2012 0625   K 3.7 05/03/2012 0625   CL 111 05/03/2012 0625   CO2 24 05/03/2012 0625   GLUCOSE 95 05/03/2012 0625   BUN 21 05/03/2012 0625   CREATININE 1.02 05/03/2012 0625   CALCIUM 8.9 05/03/2012 0625   PROT 7.6 05/02/2012 2121   ALBUMIN 4.0 05/02/2012 2121   AST 25 05/02/2012 2121   ALT 19 05/02/2012 2121   ALKPHOS 62 05/02/2012 2121   BILITOT 0.2* 05/02/2012 2121   GFRNONAA 47* 05/03/2012 0625   GFRAA 54* 05/03/2012 0625    Intake/Output Summary (Last 24 hours) at 05/04/12 1616 Last  data filed at 05/04/12 1315  Gross per 24 hour  Intake 2126.67 ml  Output    895 ml  Net 1231.67 ml    Diet Order: General  Supplements/Tube Feeding: none  IVF:    DISCONTD: sodium chloride Last Rate: 50 mL/hr at 05/03/12 2328    Estimated Nutritional Needs:   Kcal: 1150-1300  Protein: 50-60 gm Fluid:  > 1.5 L  Pt stated that recently she has been too weak to stand and prepare meals, mostly is eating frozen dinners as her main meal, sometimes eating one other meal daily. When RD entered the room pt was upset that she was being told she should go to a nursing home. Pt stated that she used to drink Ensure, but had not been recently. Pt was agreeable to drinking them while here. Pt seemed concerned about gaining too much weight.  Pt had lost 8 lbs from January 2013 to this admission, though her weight has increased 4 lbs since admission indicating she was likely dehydrated on admission. Pt is net + fluids this admission.  RD is concerned that if  pt were to return home at discharge she would not be able to meet her nutrition and hydration needs.   NUTRITION DIAGNOSIS: -Inadequate oral intake (NI-2.1).  Status: Ongoing  RELATED TO: weakness, inability to prepare meals   AS EVIDENCE BY: weight loss, pt report of decreased intake   MONITORING/EVALUATION(Goals): Goal: PO intake of meals and supplements will be >75% Monitor: PO intake, weight, labs, I/O's  EDUCATION NEEDS: -No education needs identified at this time  INTERVENTION: 1. Ensure Complete BID between meals 2. RD will continue to follow  Dietitian [phone]  DOCUMENTATION CODES Per approved criteria  -Underweight    Clarene Duke MARIE 05/04/2012, 4:13 PM

## 2012-05-04 NOTE — Discharge Summary (Signed)
Family Medicine Teaching Service DISCHARGE SUMMARY   Patient name: [name] Medical record number: [id number] Date of birth: [birth year] Age: 78 y.o. Gender: female  Attending Physician: Dr. Zachery Dauer Primary Care Provider: Rodman Pickle, MD, MD Consultants: None  Dates of Hospitalization:  05/02/2012 to 05/05/2011 Length of Stay: 2 days  Admission Diagnoses:  Tachycardia  Discharge Diagnoses:   No resolved problems to display.  Active Hospital Problems  Diagnoses Date Noted   . Tachycardia 05/02/2012   . Diastolic CHF, chronic 12/31/2011   . CAD (coronary artery disease) 12/28/2011   . TOBACCO USER 12/25/2009   . HYPERTENSION, ESSENTIAL NOS 08/07/2007   . DIABETES-TYPE 2 07/16/2007   . HYPERLIPIDEMIA 03/03/2007     Resolved Hospital Problems  Diagnoses Date Noted Date Resolved   Patient Active Problem List  Diagnoses Date Noted  . Tachycardia 05/02/2012  . Insomnia 03/12/2012  . Mobility impaired 01/07/2012  . Diastolic CHF, chronic 12/31/2011  . CAD (coronary artery disease) 12/28/2011  . TOBACCO USER 12/25/2009  . ABNORMALITY OF GAIT 07/20/2009  . FATIGUE 06/27/2009  . OSTEOPOROSIS 10/01/2007  . HYPERTENSION, ESSENTIAL NOS 08/07/2007  . DIABETES-TYPE 2 07/16/2007  . CAROTID ARTERY DISEASE 07/16/2007  . C V A/STROKE 07/16/2007  . URINARY INCONTINENCE, URGE 07/16/2007  . HX, PERSONAL, THROMBOPHLEBITIS 07/16/2007  . HYPERLIPIDEMIA 03/03/2007  . ARTHROPATHY NEC, SHOULDER 03/03/2007  . ARTHRITIS, KNEES, BILATERAL 03/03/1979    Significant Diagnostics:  LABS:  Basename 05/03/12 0625 05/03/12 0046 05/02/12 2121 02/27/12 2329 02/27/12 1759 12/31/11 0937 12/29/11 0500 12/28/11 1808 11/28/11 1203  WBC 9.8 9.0 10.1 7.3 8.3 8.6 9.4 7.0 8.0  HGB 11.5* 12.1 14.3 12.2 13.1 12.6 13.0 12.1 12.8  HCT 34.6* 36.1 41.6 36.4 39.1 39.0 38.6 35.5* 38.4  PLT 273 278 325 257 270 324 316 302 390    Basename 05/03/12 0625 05/03/12 0046 05/02/12 2121 02/28/12 0605  02/27/12 1759 12/31/11 0937 12/29/11 0500 12/28/11 1808  NA 145 -- 141 143 139 140 140 139  K 3.7 -- 4.3 4.1 4.3 3.9 3.6 4.7  CL 111 -- 104 110 105 105 104 108  CO2 24 -- 24 25 24 27 23 23   BUN 21 -- 25* 16 15 28* 18 21  CREATININE 1.02 1.03 1.22* 0.68 0.64 0.72 0.75 0.70  CALCIUM 8.9 -- 10.3 9.2 9.1 9.1 9.5 9.4  GLUCOSE 95 -- 207* 102* 108* 83 125* 102*    Metabolic:  Basename 05/03/12 0625 02/28/12 0605 12/28/11 1808  MG 2.1 2.2 2.2  PHOS -- -- --    Basename 05/02/12 2121  ALT 19  AST 25  ALKPHOS 62  BILITOT 0.2*  BILIDIR --    Basename 05/02/12 2121  PROT 7.6  ALBUMIN 4.0  PREALBUMIN --    Basename 12/29/11 0500  CHOL 226*  TRIG 70  HDL 70    Basename 05/04/12 1555 05/04/12 1121 05/04/12 0555 05/03/12 2136 05/03/12 1611 05/03/12 1132 05/03/12 0551 05/03/12 0215 02/27/12 2326 12/31/11 1059  GLUCAP 114* 104* 95 131* 153* 109* 107* 100* 137* 78    Basename 05/03/12 0625 12/29/11 0500  HGBA1C 7.0* 6.5*   No results found for this basename: MICROALBUR:3,MALB24HUR:3 in the last 8760 hours  Basename 05/03/12 0046  TSH 0.994  T3FREE --  FREET4 --  T3TOTAL --  T4TOTAL --  THYROIDAB --    Hematology:  [facility] 05/02/12 2121 02/27/12 1759 12/28/11 1808  LYMPHSABS 0.8 0.8 0.7  MONOABS 0.8 0.6 0.7  EOSABS 0.0 0.0 0.0  BASOSABS 0.0 0.0  0.0    Basename 05/03/12 0625 05/03/12 0046 05/02/12 2121  RDW 15.9* 15.8* 15.7*  MCV 85.2 84.1 85.8  MCHC 33.2 33.5 34.4  MRET -- -- --    Basename 05/02/12 2121 02/27/12 1759  INR 0.93 0.98  APTT -- 26    Other Labs:  Basename 05/02/12 2131 02/28/12 0605 02/27/12 1800 12/28/11 1811  CKTOTAL -- 131 163 93  TROPONINI <0.30 <0.30 <0.30 --  TROPONINT -- -- -- --  CKMBINDEX -- -- -- --    Basename 12/28/11 1812  DDIMER 1.96*   Urinalysis  Basename 05/02/12 2358  COLORURINE YELLOW  APPEARANCEUR HAZY*  LABSPEC 1.019  PHURINE 7.5  GLUCOSEU NEGATIVE  HGBUR NEGATIVE  BILIRUBINUR NEGATIVE  KETONESUR  NEGATIVE  PROTEINUR NEGATIVE  UROBILINOGEN 0.2  NITRITE NEGATIVE  LEUKOCYTESUR NEGATIVE    IMAGING: Dg Chest Port 1 View  05/02/2012  *RADIOLOGY REPORT*  Clinical Data: Chronic irregular heart beat.  Shortness of breath. Chest pain.  PORTABLE CHEST - 1 VIEW  Comparison: 02/27/2012  Findings: The thoracolumbar scoliosis.  Normal heart size and pulmonary vascularity.  Hyperinflation and scattered fibrosis suggesting emphysematous changes.  No focal airspace consolidation in the lungs.  No blunting of costophrenic angles.  No pneumothorax.  The calcified and tortuous aorta.  Degenerative changes in the shoulders.  No significant change since previous study.  IMPRESSION: Emphysematous changes and scattered fibrosis in the lungs.  No evidence of active pulmonary disease.  Original Report Authenticated By: Marlon Pel, M.D.    Procedures:   None  Discharge Vitals and Exam:  Vitals: No data found.  Wt Readings from Last 3 Encounters:  05/04/12 108 lb 6.4 oz (49.17 kg)  03/19/12 110 lb (49.896 kg)  03/12/12 110 lb (49.896 kg)   No intake or output data in the 24 hours ending 05/06/12 1746  PE: GENERAL:  Elderly AA female.  Sitting comfortably in bed  In no discomfort; norespiratory distress.   PSYCH: Alert and appropriately interactive; Insight:Good  Alert, oriented, thought content appropriate, alertness: alert, orientation: time, date, person, place, city, president, thought content exhibits logical connections - MMSE performed and pt scored 25 (missed 3 recall; the day of the month by 1 day; and intersecting pentagons) H&N: AT/[place], MMM, no scleral icterus, EOMi THORAX: HEART: RRR, S1/S2 heard, no murmur; regular sinus with occasional PACs on telemetry LUNGS: CTA B, no wheezes, no crackles ABDOMEN:  +BS, soft, non-tender, no rigidity, no guarding, no masses/organomegaly EXTREMITIES: Moves all 4 extremities spontaneously, warm well perfused, no edema, bilateral DP and PT pulses 2/4;  mildly cachatic    Discharge Information   Disposition: To Home; pt refuses home helath Discharge Diet: Resume diet Discharge Condition:  Improved Discharge Activity: Ad lib  Medication List  As of 05/06/2012  5:46 PM   TAKE these medications         acetaminophen 650 MG CR tablet   Commonly known as: TYLENOL   Take 1 tablet (650 mg total) by mouth every 8 (eight) hours. For arthritis pain.      aspirin 81 MG chewable tablet   Chew 81 mg by mouth daily.      B-12 100 MCG Tabs   Take 100 mcg by mouth daily.      B-6 100 MG Tabs   Take 100 mg by mouth daily.      Black Cohosh 160 MG Caps   Take 160 mg by mouth daily.      CALCIUM PO   Take 1 tablet  by mouth 2 (two) times daily.      feeding supplement Liqd   Take 237 mLs by mouth 2 (two) times daily between meals.      Ginkgo Biloba 120 MG Caps   Take 120 mg by mouth daily.      lisinopril 5 MG tablet   Commonly known as: PRINIVIL,ZESTRIL   Take 1 tablet (5 mg total) by mouth daily.      metoprolol tartrate 25 MG tablet   Commonly known as: LOPRESSOR   Take 1 tablet (25 mg total) by mouth 2 (two) times daily.             Follow Up Appointments:    Discharge Orders    Future Appointments: Provider: Department: Dept Phone: Center:   05/15/2012 3:00 PM Amber Nydia Bouton, MD [facility] Res[facility] [phone] [facility]     Future Orders Please Complete By Expires   Diet - low sodium heart healthy      Increase activity slowly        Follow-up Information    Follow up with Rodman Pickle, MD on 05/15/2012. (3:00 PM)    Contact information:   [address] [place] [address] [phone]            Brief Hospital Course & Follow up Issues   Pending Results: none  KELLYE MIZNER is a 78 y.o. year old female who presented with complaints of palpitations and anxiety.  She was found to have tachycardia to the 190s which responded to a fluid bolus.  She was recently admitted in 02/2012 for  a syncopal episode that was not associated with palpitations.  During her last hospitalization she underwent an extensive workup with workup including normal A1c, TSH, ECHO with diastolic dysfunction.  She was observed in the hospital and did well with complete spontaneous resolution of her symptoms.  She was continued on her beta-blocker and TSH and electrolytes were checked which were normal; her HbA1c was slightly elevated to 7.0.    She currently lives alone and was insistent on returning to home alone.  PT recommended PT home health but she refused additional assistance and requested to return home in spite of recommendations.  MMSE performed prior to pt's discharge showing mild cognitive impairment with a score of 25.    She is to follow up with Dr. Mikel Cella on May 25 for hospital follow-up to further discuss her palpitations and living arrangements.     Andrena Mews, DO [facility] Resident - PGY-1 05/06/2012 5:46 PM

## 2012-05-04 NOTE — Progress Notes (Signed)
Pt discarged per W/C accompanied by friends and nurse tech with all belongings. Marisa Cyphers RN

## 2012-05-08 NOTE — Progress Notes (Signed)
05/08/2012 [name] Case Management Note [phone]  Utilization review completed.

## 2012-05-15 ENCOUNTER — Inpatient Hospital Stay: Payer: Medicare Other | Admitting: Family Medicine

## 2012-09-21 ENCOUNTER — Ambulatory Visit (INDEPENDENT_AMBULATORY_CARE_PROVIDER_SITE_OTHER): Payer: Medicare Other | Admitting: Family Medicine

## 2012-09-21 ENCOUNTER — Encounter: Payer: Self-pay | Admitting: Family Medicine

## 2012-09-21 VITALS — BP 145/74 | HR 110 | Temp 98.0°F | Ht 60.0 in | Wt 105.2 lb

## 2012-09-21 DIAGNOSIS — R269 Unspecified abnormalities of gait and mobility: Secondary | ICD-10-CM

## 2012-09-21 DIAGNOSIS — L84 Corns and callosities: Secondary | ICD-10-CM

## 2012-09-21 DIAGNOSIS — Z7409 Other reduced mobility: Secondary | ICD-10-CM

## 2012-09-21 DIAGNOSIS — R69 Illness, unspecified: Secondary | ICD-10-CM

## 2012-09-21 DIAGNOSIS — M129 Arthropathy, unspecified: Secondary | ICD-10-CM

## 2012-09-21 NOTE — Patient Instructions (Addendum)
You had a steroid injection into your left knee today.  It should start to help with the knee pain in the next 24 hours. If the knee pain is improved, then we can inject your right knee on your next office visit.   Go to urinate every two hours, whether you feel like it or not.  This will help prevent you from losing your urine unexpectedly.

## 2012-09-22 ENCOUNTER — Encounter: Payer: Self-pay | Admitting: Family Medicine

## 2012-09-22 DIAGNOSIS — L84 Corns and callosities: Secondary | ICD-10-CM | POA: Insufficient documentation

## 2012-09-22 NOTE — Progress Notes (Signed)
Subjective:    Patient ID: [name], female    DOB: 08/17/1920, 78 y.o.   MRN: [id number]  HPI Office Visit:  There are other unrelated non-urgent complaints, but due to the busy schedule and the amount of time I've already spent with her, time does not permit me to address these routine issues at today's visit. I've requested another appointment to review these additional issues.   Patient is accompanied by: mother and church member, [name] who provided a list of the patient's medications that she copied down from the bottles at patient's night stand. In addition to the Rx medications, there were numerous vitamins & minerals and supplements.  Primary caregiver: patient, mother and brother History obtained from: Patient and [name]  History Chief complaint Knee Pain, Fatigue and Trouble sleeping   Knee pain Pt with long standing anterior knee pain, left > right. It interferes with her sleep at night.  Patient takes tylenol off and on for this. Worse in the morning, better with movement. No joint swelling/redness/tenderness.  No fevers. No rash.  She does not take any other pain medications for it.   ROS: See HPI  Health Maintenance reviewed: Immunization History  Administered Date(s) Administered  . Pneumococcal Polysaccharide 09/22/1994  . Td 05/23/2004   Health Maintenance Topics with due status: Overdue     Topic Date Due   FOOT EXAM 04/20/1932   OPHTHALMOLOGY EXAM 04/20/1932   ZOSTAVAX 04/20/1982   INFLUENZA VACCINE 08/23/2012    Mini-Mental State Examination: Total Score:   Geriatric Depression Scale: Total Score:   Advanced Directives: Code Status:       Medications / Allergies were reviewed:  Allergies  Allergen Reactions  . Codeine     Unsure of reaction  . Penicillins     Unsure of reaction     Medical Histories reviewed and updated: Past Medical History  Diagnosis Date  . CVA (cerebral vascular accident)   . CAD (coronary artery  disease)   . Vitamin d deficiency   . Diabetes mellitus, type 2   . HTN (hypertension)   . HLD (hyperlipidemia)   . Tremor, essential   . Urinary incontinence   . DIABETES-TYPE 2 07/16/2007  . HYPERLIPIDEMIA 03/03/2007  . TOBACCO USER 12/25/2009  . HYPERTENSION, ESSENTIAL NOS 08/07/2007  . CAROTID ARTERY DISEASE 07/16/2007  . C V A/STROKE 07/16/2007  . Palpitations 02/08/2010  . High risk social situations 11/28/2011  . Intention tremor 03/03/2007    Qualifier: Diagnosis of  By: Dorathy Daft  MD, IDAYLIS    . Syncope, near 02/28/2012  . Altered mental status 01/01/2011    Qualifier: Diagnosis of  By: Earnest Bailey MD, Selena Batten    . DEFICIENCY, VITAMIN D NOS 07/16/2007    Qualifier: Diagnosis of  By: Irving Burton MD, Clifton Custard    . Myocardial infarction   . Angina   . Heart murmur   . Asthma   . Pneumonia, history   . Hyperthyroidism   . Anemia   . Chronic kidney disease   . H/O hiatal hernia   . Seizures   . Headache   . Neuromuscular disorder   . Arthritis   . Fibromyalgia    @PSHPF @ family history includes Diabetes in her brother and mother and Stroke in her father. History   Social History  . Marital Status: Widowed    Spouse Name: N/A    Number of Children: N/A  . Years of Education: N/A   Occupational History  . Retired  from [place] [employer]   Social History Main Topics  . Smoking status: Current Every Day Smoker -- 0.5 packs/day    Types: Cigarettes  . Smokeless tobacco: Never Used   Comment: does not smoke much...  . Alcohol Use: No  . Drug Use: No  . Sexually Active: No   Other Topics Concern  . Not on file   Social History Narrative   Lives at home alone. Un able to cook, bathe and cleans by herself. Walks with cane and crutch. Has a wheelchair but not able to use it.Does not have anyone she can really trust. Has a friend that works at the [employer] that stops by to help some.Lives off social security, retired from [employer] in 1988.After mortgage,  has $400 left.Does not have a POA if anything should happen to her. Has 2 nieces, 2 nephews that live far away. (Only one nephew "knows that I am alive")She does want CPR attempted but does not wish to have too much done. Organ donor/donating body to science. Diet consists mostly of greens and veggies. Little meat, must force herself to eat meat.Smokes 2-4 cigs per day.     Risk Evaluation:  Depression Screen (if positive will perform GDS / PHQ-9)  1. Over the last two weeks, have you felt down, depressed, or hopeless? NO.  2. Over the last two weeks, have you felt little interest or pleasure in doing things? NO.    Function Screen  1. Do you need help with preparing meals, transportation, shopping, taking your medicine, managing you finances, or other activities of daily living? YES.  2. Do you live alone? YES.   Home Safety Screen  1. Does your home have throw rugs, poor lighting, or a slippery bathtub/shower?   2. Does your home LACK grab bars in bathrooms, handrails on stairs and steps?   3. Does your home LACK functioning smoke alarms?     Risk for Falls Screen  1. Was the patient unsteady or take longer than 30 seconds during the timed "get up and go" test? YES.     Vital Signs Weight: 105 lb 3 oz (47.713 kg) Weight down ~ 5 pounds in last 6 months Body mass index is 20.54 kg/(m^2). Estimated Creatinine Clearance: 26.3 ml/min (by C-G formula based on Cr of 1.02). Body surface area is 1.42 meters squared. Filed Vitals:   09/21/12 1503  BP: 145/74  Pulse: 110  Temp: 98 F (36.7 C)  TempSrc: Oral  Height: 5' (1.524 m)  Weight: 105 lb 3 oz (47.713 kg)   Wt Readings from Last 2 Encounters:  09/21/12 105 lb 3 oz (47.713 kg)  05/04/12 108 lb 6.4 oz (49.17 kg)    Physical Examination Findings General : Groomed, wearing wig.   Attentive   Heart  : RRR. 2/6 Systolic m best LUSB without radiation into carotids.  Lungs  : BCTA, no wheeze or crackles Extremity : Corn on left  foot medial second toe. Tender. (+) hallusis valgus bilaterally Musc. Skel. : bilateral knee valgus deformities.  (+) bilateral crepitus and medial knee boney hypertrophy bilaterally Gait  : 30 sec Get-up N' Go. Requires use of bilateral cane/crutch.   Psych  :Psychomotor:No psychomotor slowing};  Eye contact:Good;  COOPERATIVE  Mood:Upbeat mood; Affect:FULL ; Speech:Clear and fluent; Cognitive problems: were observed in the areas of tending to reminisce rather than answer question directly Insight:Good    Gera was seen today for knee pain, fatigue and trouble sleeping.  Diagnoses and  associated orders for this visit:  Arthritis, knees, bilateral - After sterile prep, left knee injected with 40 mg solumedrol with 2 ml lidocaine 1%.  Patient tolerated procedure well.  - If helps, would offer patient injection into other troublesome knee. - Encourage use of schedule APAP 650 THREE TIMES DAILY to QID  Mobility impaired - Patient needs further [facility]  High Risk situation - From my very brief acute visit with [name], I am concerned that she is at risk of being unable to meet her ADLs in near future.  She is amenable to idea of moving to an independent senior living situation or an Assisted Living situation.  Pt does not have family to help her with making this decision and transition. Will ask [name] to contact patient about process of transitioning to a setting with increased level of supervision.  - Would recommend a formal MMSE to look for cognitive impairment in [name].  I am not certain if her preference for long-term memories during our interview was just her personality or an attempt to avoid display of short-term memory difficulties. - Referral to [facility] for Fall risk assessment and ADL/iADL capacity and Cognitive impairment assessments.  - Would like to get patient back into to see her PCP as I think patient is entering a  period where closer primary care monitoring is prudent. - Monitor trend in weight loss.              Review of Systems     Objective:   Physical Exam        Assessment & Plan:

## 2012-09-23 ENCOUNTER — Telehealth: Payer: Self-pay | Admitting: Clinical

## 2012-09-23 NOTE — Telephone Encounter (Signed)
Message copied by Theresia Bough on Wed Sep 23, 2012 10:28 AM ------      Message from: [facility], TODD D      Created: Tue Sep 22, 2012  9:15 AM      Regarding: Patient interested in Assited Living transition       Nelva Bush,            I saw Ms Prindiville 9/30 for an acute visit appointment.      She was brought by one of her church members.            She lives alone at age 78, and  appears to be having difficulty with some of her ADLs.            She is interested in transitioning to an Assisted Living facility, but needs some help in understanding how to go about it.        Could you give her a call to discuss the process?            Thank you,      Tawanna Cooler

## 2012-09-23 NOTE — Telephone Encounter (Signed)
Clinical Social Worker (CSW) received a referral to contact 78yo pt regarding ALF/SNF placement.  CSW introduced herself and role in the clinic. CSW informed pt that CSW can facilitate with nursing home placement however pt stated that at this moment she is not interested. Pt stated she has a home that she will pay off this month and has "affairs that need to be taken care of before going into a facility." CSW validated pt feelings of concern. CSW encouraged pt to begin the DSS/applying for Medicaid process now rather than waiting when pt is ready to go into a nursing facility. CSW informed pt that when applying for Medicaid a DSS worker will inform pt of requirements needed to qualify. Pt could potentially receive assistance with getting affairs in order. Pt did not seem very receptive and proceeded to talk about her neighbor that breaks into her home when she leaves for appointments. CSW was very concerned about this especially considering pt lives alone. CSW encouraged pt to call the police when she feels that someone has broken into her home. CSW explored whether pt has a security system and pt stated she does however she does not believe it works because it never goes off. CSW observed that pt has an appointment tomorrow with her PCP at 10am. CSW asked whether pt would consider meeting with CSW tomorrow at 10:30am so that CSW can assist pt in contacting the security system as well as provide pt with education regarding the placement process. Pt agreed and will plan to meet CSW tomorrow at 10:30am.  Currently pt receives meals daily from meals on wheels, ambulates by using 2 canes and receives support from her church family.  Theresia Bough, MSW, Theresia Majors [phone]

## 2012-09-24 ENCOUNTER — Ambulatory Visit (INDEPENDENT_AMBULATORY_CARE_PROVIDER_SITE_OTHER): Payer: Medicare Other | Admitting: Family Medicine

## 2012-09-24 VITALS — BP 144/88 | HR 87 | Temp 98.3°F | Ht 60.0 in | Wt 107.0 lb

## 2012-09-24 DIAGNOSIS — N3941 Urge incontinence: Secondary | ICD-10-CM

## 2012-09-24 DIAGNOSIS — M129 Arthropathy, unspecified: Secondary | ICD-10-CM

## 2012-09-24 DIAGNOSIS — I10 Essential (primary) hypertension: Secondary | ICD-10-CM

## 2012-09-24 DIAGNOSIS — R413 Other amnesia: Secondary | ICD-10-CM

## 2012-09-24 MED ORDER — LISINOPRIL 5 MG PO TABS: 5.0000 mg | ORAL_TABLET | Freq: Every day | ORAL | Status: DC

## 2012-09-24 MED ORDER — OXYBUTYNIN CHLORIDE ER 10 MG PO TB24: 10.0000 mg | ORAL_TABLET | Freq: Every day | ORAL | Status: DC

## 2012-09-24 MED ORDER — METOPROLOL TARTRATE 25 MG PO TABS: 25.0000 mg | ORAL_TABLET | Freq: Two times a day (BID) | ORAL | Status: DC

## 2012-09-24 NOTE — Patient Instructions (Signed)
It was nice to meet you.  For your urine: go to the bathroom every 2 hours. We refilled your bladder medicine.  If you feel like it makes your memory worse, come back and we can talk about stopping it.  For your knees: go and get extended release acetaminophen SR (tylenol arthritis)- it lasts a little longer, so it may help with the pain all night. Take 650mg  three times per day. Every day. This well help stay in front of the pain.  We did NOT do an injection today since you just had one a few days ago on your left knee.  Follow up with your regular doctor (Dr. Mikel Cella) in a few weeks.

## 2012-09-24 NOTE — Progress Notes (Signed)
S: Pt comes in today for follow up.  ** At the end of the visit, it was discovered that pt no longer has transportation for her medications.  Reports she HAS Been taking her BP meds (lisinopril, metoprolol) but no other medications.  States she has not missed any doses in the past 1-2 weeks but is almost out.  Pt also reports, however, that she was unable to get meds filled last month due to lack of transportation.**   URINARY INCONTINENCE This is not a new problem, but seems to have worsened over the past few weeks.  Pt was on oxybutynin but ran out ~2 weeks ago.  Is not sure if it helped when she started it, but night time urination has worsened since running out of it.  She states she has been getting up more at night to urinate, usually every 2 hours. No falls. Pt reports "always" having a bladder problem, ever since she was a baby, but worsened after her hysterectomy.  Pt feels the sensation that she needs to go, but has significant urgency.  Wakes up dry at night but sometimes does not make it to the bathroom, which she attributes to her knees (see below).  No hematuria or dysuria. + frequency. No fevers/chills, no N/V.   KNEE PAIN Also not a new issue. Has known bilateral osteoarthritis. Received L knee injection by Dr. Perley Jain on 9/30, which she does not think helped much but she is not interested in an injection of other knee today because it does not bother her as much.  Pt does not remember the discussion of taking tylenol TID to QID rather than PRN and has not been doing this.  Has been taking tylenol at least 2x/day because of pain. Pain is constant, achy ("they just plain hurt").  Uses soft knee sleeves to make them feel more supported.  Has not had any falls. Ambulates with 1 cane and 1 crutch. Has tried many topical OTC treatments, Bengay seems to help the most.  Uses a different kind each time she needs to buy a new one because she doesn't want her knees to get used to the medicine. Has  noticed some swelling but no redness or warmth.    ROS: Per HPI  History  Smoking status  . Current Every Day Smoker -- 0.5 packs/day  . Types: Cigarettes  Smokeless tobacco  . Never Used  Comment: does not smoke much...    O:  Filed Vitals:   09/24/12 1005  BP: 144/88  Pulse: 87  Temp: 98.3 F (36.8 C)    Gen: NAD, thin, well groomed, very pleasant, alert and interactive  Ext: minimal swelling, + mild TTP  MMSE: 26/30 (points lost for date, recall- only 1/3, copying design)   A/P: 78 y.o. female p/w Urinary incontinence, knee arthritis -See problem list -f/u in a few weeks with PCP

## 2012-09-24 NOTE — Assessment & Plan Note (Signed)
Schedule tylenol, no injection today

## 2012-09-24 NOTE — Assessment & Plan Note (Signed)
Refilled meds with 30 day supply.  Pt reports taking meds, not missing any doses. Reviewed s/s of hypotension and reasons to call for guidance on if she needs to stop med.  Lisinopril and metoprolol are both low doses.  Will encourage PCP to follow BP closely at f/u visit, as pt's BP usually is 140's/80's; if it were to acutely drop then consider not having pt continue meds (if BP is suddenly lower at next visit, we will know pt was not actually taking BP meds).   BP Readings from Last 3 Encounters:  09/24/12 144/88  09/21/12 145/74  05/04/12 141/82

## 2012-09-24 NOTE — Assessment & Plan Note (Signed)
MMSE: 26/30 today. Unclear if this is dementia vs benign forgetfulness.  Will monitor for worsening via MMSE's.

## 2012-09-24 NOTE — Assessment & Plan Note (Signed)
Refilled oxybutynin. F/u in a few weeks to assess if this is helping.

## 2012-10-01 ENCOUNTER — Encounter: Payer: Self-pay | Admitting: Clinical

## 2012-10-01 NOTE — Progress Notes (Signed)
Late Entry for 09/24/12  Clinical Social Worker (CSW) met with pt at the end of her appointment with her PCP. CSW informed pt of need to apply for Medicaid asap. CSW provided pt with information as to where to apply for Medicaid and the importance in needing to do so. CSW also informed by pt that she received transportation by a friend from her church. CSW received consent to speak to pt friend and provide her with same information given to pt. CSW informed friend of pt need to get prescriptions filled once leaving her appointment with her PCP. Pt friend agreed to assist pt with obtaining her medications as well as with taking her to DSS to apply for Medicaid. CSW provided pt and friend with CSW contact information. CSW available to provide support and facilitate with SNF/ ALF placement if pt becomes agreeable in the future.  Theresia Bough, MSW, Theresia Majors [phone]

## 2012-10-15 ENCOUNTER — Ambulatory Visit (INDEPENDENT_AMBULATORY_CARE_PROVIDER_SITE_OTHER): Payer: Medicare Other | Admitting: Family Medicine

## 2012-10-15 ENCOUNTER — Encounter: Payer: Self-pay | Admitting: Family Medicine

## 2012-10-15 VITALS — BP 159/91 | HR 108 | Ht 64.0 in | Wt 111.0 lb

## 2012-10-15 DIAGNOSIS — M129 Arthropathy, unspecified: Secondary | ICD-10-CM

## 2012-10-15 DIAGNOSIS — N3941 Urge incontinence: Secondary | ICD-10-CM

## 2012-10-15 DIAGNOSIS — I10 Essential (primary) hypertension: Secondary | ICD-10-CM

## 2012-10-15 MED ORDER — METOPROLOL TARTRATE 50 MG PO TABS: 50.0000 mg | ORAL_TABLET | Freq: Two times a day (BID) | ORAL | Status: DC

## 2012-10-15 MED ORDER — SOLIFENACIN SUCCINATE 10 MG PO TABS: 10.0000 mg | ORAL_TABLET | Freq: Every day | ORAL | Status: DC

## 2012-10-15 NOTE — Patient Instructions (Signed)
It was good to see you today! We are going to try a different medicine for your bladder. Let me know how it goes.  I will see you back in 4-6 weeks for a follow-up on your blood pressure and your bladder!  Wang Granada M. Mosiah Bastin, M.D.

## 2012-10-15 NOTE — Progress Notes (Signed)
Patient ID: [name], female   DOB: [birth year], 78 y.o.   MRN: [id number] [facility] Amber M. Hairford, MD Phone: [phone]   Subjective: HPI: Patient is a 78 y.o. female presenting to clinic today for follow up. Concerns today include:  1. Urinary incontinence - Known bladder problem, incontinence worsened after hysterectomy. Able to control urine a little bit. Takes Oxybutinin daily with little help. Does not have to be doing anything. Does not have dysuria. Noticed urine is darker recently but no burning. Drinks water. Getting worse.  2. "Nerves"- Noticed she is shaking for the last few days. Noticed mostly in her hands. Has not tried anything for the shaking. Feels nervous. No pain in hands but does walk with canes bilaterally. Patient has known history of essential tremor, but states this is worse than usual. Good sensation in hands.  3. Pain- Bilateral knee pain, unchanged. Some back pain, shoulder pain. Takes some medication but not consistently. Able to get around on canes but is at a fall risk. Has a life alert necklace.  History Reviewed: Non-smoker. Health Maintenance: Needs flu shot, but refuses today.  ROS: Please see HPI above.  Objective: Office vital signs reviewed.  Physical Examination:  General: Eldery AAF. Awake, alert. NAD. HEENT: Atraumatic, normocephalic.  Pulm: CTAB, no wheezes Cardio: RRR, no murmurs appreciated Abdomen:+BS, soft, nontender, nondistended Extremities: Trace bilateral edema Neuro: Grossly intact. Gait abnormal   Assessment: 78 yo female follow up appointment  Plan: See Problem List and After Visit Summary

## 2012-10-15 NOTE — Assessment & Plan Note (Signed)
Continue current regimen and using canes. Would likely benefit from living in a facility, but at this time that is not likely an option. COnitnue to monitor.

## 2012-10-15 NOTE — Assessment & Plan Note (Signed)
Oxybutinin does not seem to be helping much. Will switch to Vesicare daily. This does likely meet Beers criteria given the anticholinergic properties of the medication so we will keep a close eye on that. If she fails this, will consider referral to GYN for pessary placement.

## 2012-10-15 NOTE — Assessment & Plan Note (Signed)
Patient states she does not have problem with HTN, but her BP is slightly elevated today to 159/91. Since her tremor is worsening, I will increase the beta blocker which will hopefully help with tremor, HTN and tachycardia to 108.

## 2012-11-26 ENCOUNTER — Ambulatory Visit (INDEPENDENT_AMBULATORY_CARE_PROVIDER_SITE_OTHER): Payer: Medicare Other | Admitting: Family Medicine

## 2012-11-26 ENCOUNTER — Encounter: Payer: Self-pay | Admitting: Family Medicine

## 2012-11-26 ENCOUNTER — Telehealth: Payer: Self-pay | Admitting: Home Health Services

## 2012-11-26 VITALS — BP 136/84 | HR 88 | Temp 98.3°F | Ht 64.0 in | Wt 169.9 lb

## 2012-11-26 DIAGNOSIS — R05 Cough: Secondary | ICD-10-CM

## 2012-11-26 MED ORDER — GUAIFENESIN ER 600 MG PO TB12: 600.0000 mg | ORAL_TABLET | Freq: Two times a day (BID) | ORAL | Status: DC

## 2012-11-26 NOTE — Progress Notes (Signed)
Family Medicine Office Visit Note   Subjective:   Patient ID: [name], female  DOB: [birth year], 78 y.o.. MRN: [id number]   Pt that comes today complaining of cough for about 6 days. She has hx of tobacco abuse and reports has had bronchitis diagnosed about twice every year. The cough is reported to be dry and "tickles her throat". She states feeling congestion but not sputum production. Denies fever or upper respiratory symptoms. She denies SOB, orthopnea or swelling.   Review of Systems:  Per HPI  Objective:   Physical Exam: Gen:  NAD HEENT: Moist mucous membranes  CV: Regular rate and rhythm, no murmurs rubs or gallops PULM: Clear to auscultation bilaterally. No wheezes/rales/rhonchi ABD: Soft, non tender, non distended, normal bowel sounds EXT: No edema Neuro: Alert and oriented x3. No focalization  Assessment & Plan:

## 2012-11-26 NOTE — Telephone Encounter (Signed)
Pt did not have transportation home.  Pt was physically unable to take public bus transportation.  Pt was given $12 for taxi ride.  Pt understands this is a one time resource.

## 2012-11-26 NOTE — Patient Instructions (Addendum)

## 2012-11-26 NOTE — Assessment & Plan Note (Signed)
Patient has long history of being a smoker. With this respiratory condition she continues to smoke up to 5 cigarettes a day. Denies fever, shortness of breath or sputum production. Physical exam is negative for rales or wheezing. She does not appear to be fluid overloaded. Normal vitals including O2 sats. Plan: Encouraged to stop smoking at least during the episodes of exacerbation. Mucinex twice a day. Instructed to get re-evaluated if she develops dyspnea, orthopnea, edema, fever or other symptom of worsening condition.

## 2012-12-31 ENCOUNTER — Ambulatory Visit: Payer: Medicare Other | Admitting: Family Medicine

## 2013-01-14 ENCOUNTER — Encounter: Payer: Self-pay | Admitting: Family Medicine

## 2013-01-14 ENCOUNTER — Ambulatory Visit (INDEPENDENT_AMBULATORY_CARE_PROVIDER_SITE_OTHER): Payer: Medicare Other | Admitting: Family Medicine

## 2013-01-14 VITALS — BP 149/80 | HR 85 | Temp 99.0°F | Ht 64.0 in | Wt 169.0 lb

## 2013-01-14 DIAGNOSIS — G4700 Insomnia, unspecified: Secondary | ICD-10-CM

## 2013-01-14 DIAGNOSIS — I10 Essential (primary) hypertension: Secondary | ICD-10-CM

## 2013-01-14 DIAGNOSIS — E119 Type 2 diabetes mellitus without complications: Secondary | ICD-10-CM

## 2013-01-14 DIAGNOSIS — N3941 Urge incontinence: Secondary | ICD-10-CM

## 2013-01-14 DIAGNOSIS — M129 Arthropathy, unspecified: Secondary | ICD-10-CM

## 2013-01-14 MED ORDER — MIRTAZAPINE 7.5 MG PO TABS: 7.5000 mg | ORAL_TABLET | Freq: Every day | ORAL | Status: DC

## 2013-01-14 MED ORDER — MELOXICAM 7.5 MG PO TABS: 7.5000 mg | ORAL_TABLET | Freq: Every day | ORAL | Status: DC

## 2013-01-14 NOTE — Progress Notes (Signed)
Patient ID: JOANA NOLTON, female   DOB: 1920/04/14, 79 y.o.   MRN: [id number]  [facility] Latoiya Maradiaga M. Zarinah Oviatt, MD Phone: [phone]   Subjective: HPI: Patient is a 79 y.o. female presenting to clinic today for follow up appointment. Brought it by friend. Concerns today include arthritis  1. Arthritis- Pain in knees. Still taking OTC medication, which does not help much. Still walks with cane and crutch. Has motorized wheelchair but does not use it.  2. Overactive bladder- Vesicare is helping. Only getting up once per night. Does have frequency, which is not new. (Of note, this has been going on since childhood and worse after hysterectomy). States she does not want bedside toilet and had rather go to the bathroom.  3. Insomnia- Gets in bed and not able to fall asleep. Once she falls asleep, she stays asleep for a few hours. She also has decreased appetite. Has tried Benadryl which helped.  4. HTN- BP 149/80 today. Does not take medications. Denies CP, abd pain, leg swelling.   5. DM- A1C today was 6.3. Patient has never been on medication.   History Reviewed: Everyday smoker. Health Maintenance: Declined flu shot  ROS: Please see HPI above.  Objective: Office vital signs reviewed. BP 149/80  Pulse 85  Temp 99 F (37.2 C) (Oral)  Ht 5\' 4"  (1.626 m)  Wt 169 lb (76.658 kg)  BMI 29.01 kg/m2  Physical Examination:  General: Awake, alert and oriented. NAD. Very talkative and pleasant HEENT: Atraumatic, normocephalic. Wears wig.  Neck: No masses palpated. No LAD. Multiple hyperpigmented nevi on neck Pulm: CTAB, no wheezes Cardio: RRR, no murmurs appreciated Abdomen:+BS, soft, nontender, nondistended Extremities: No edema. Valgus deformity of lower extremities. Walks with cane and crutch Neuro: Grossly intact  Assessment: 80 yo F follow up appointment  Plan: See Problem List and After Visit Summary

## 2013-01-14 NOTE — Assessment & Plan Note (Signed)
Continue Oxybutynin and Vesicare. Pt states she does not know a difference, but she also mentioned she is not getting up as much at night. Will continue to monitor.

## 2013-01-14 NOTE — Assessment & Plan Note (Signed)
Pt does not take medication. Her A1C was 6.3 today. She is 79 years old and I do not expect her to check her blood sugar. I have discussed with patient and we feel that checking on her DM is useless at this point. We will keep a close eye on her creatinine and glucose with random blood draws, but A1C q3 months may not be best for her.

## 2013-01-14 NOTE — Patient Instructions (Signed)
I have sent in 2 new prescriptions for you.  1. Remeron (mirtazipine)- This is for sleep. Take one tablet 30 minutes before bedtime.  2. Mobic (meloxicam)- this is an anti-inflammatory for your arthritis. Continue taking Tylenol three times per day. This medication must be taken with lots of water. Come back to see me in 2-3 weeks to check on your kidney function.  Take care! Jenalee Trevizo M. Gwenith Tschida, M.D.

## 2013-01-14 NOTE — Assessment & Plan Note (Signed)
Pt reports difficulty falling asleep. I do not want her oversedated since she has urinary problems and will likely still get up to go to the bathroom every night, this will cause her to fall. Will try Remeron 7.5mg  nightly. This will hopefully also help to stimulate her appetite. RTC within one month for follow up.

## 2013-01-14 NOTE — Assessment & Plan Note (Signed)
Takes tylenol but does not seem to be enough. Last Creat was wnl, but known to be elevated in the past with dehydration. Given Mobic 7.5mg  daily. She should RTC in 2-4 weeks for recheck and will do BMP at that time to check kidney function. She should drink lots of fluid and stay hydrated, patient agrees.

## 2013-01-14 NOTE — Assessment & Plan Note (Signed)
States she is not taking any BP medication and sher overall feels well. BP at goal today for her age. Will not make any changes.  Filed Vitals:   01/14/13 1515  BP: 149/80  Pulse: 85  Temp: 99 F (37.2 C)

## 2013-02-11 ENCOUNTER — Other Ambulatory Visit: Payer: Medicare Other

## 2013-04-29 ENCOUNTER — Ambulatory Visit (INDEPENDENT_AMBULATORY_CARE_PROVIDER_SITE_OTHER): Payer: Medicare Other | Admitting: Family Medicine

## 2013-04-29 VITALS — BP 151/54 | HR 76 | Temp 98.3°F | Wt 107.0 lb

## 2013-04-29 DIAGNOSIS — G4700 Insomnia, unspecified: Secondary | ICD-10-CM

## 2013-04-29 DIAGNOSIS — I10 Essential (primary) hypertension: Secondary | ICD-10-CM

## 2013-04-29 DIAGNOSIS — L609 Nail disorder, unspecified: Secondary | ICD-10-CM

## 2013-04-29 DIAGNOSIS — R269 Unspecified abnormalities of gait and mobility: Secondary | ICD-10-CM

## 2013-04-29 DIAGNOSIS — E119 Type 2 diabetes mellitus without complications: Secondary | ICD-10-CM

## 2013-04-29 DIAGNOSIS — L608 Other nail disorders: Secondary | ICD-10-CM

## 2013-04-29 LAB — CBC
MCV: 87.7 fL (ref 78.0–100.0)
Platelets: 343 10*3/uL (ref 150–400)
RDW: 15.8 % — ABNORMAL HIGH (ref 11.5–15.5)
WBC: 6.3 10*3/uL (ref 4.0–10.5)

## 2013-04-29 LAB — COMPREHENSIVE METABOLIC PANEL
ALT: 13 U/L (ref 0–35)
CO2: 27 mEq/L (ref 19–32)
Chloride: 107 mEq/L (ref 96–112)
Sodium: 136 mEq/L (ref 135–145)
Total Bilirubin: 0.3 mg/dL (ref 0.3–1.2)
Total Protein: 6.4 g/dL (ref 6.0–8.3)

## 2013-04-29 LAB — POCT GLYCOSYLATED HEMOGLOBIN (HGB A1C): Hemoglobin A1C: 6.4

## 2013-04-29 MED ORDER — MIRTAZAPINE 15 MG PO TBDP: 7.5000 mg | ORAL_TABLET | Freq: Every day | ORAL | Status: DC

## 2013-04-29 NOTE — Progress Notes (Signed)
Patient ID: CHIVON LEPAGE, female   DOB: [birth year], 79 y.o.   MRN: [id number]  [facility] Shanterica Biehler M. Antaeus Karel, MD Phone: [phone]   Subjective: HPI: Patient is a 79 y.o. female presenting to clinic today for follow up appointment. Concerns today include pain all over. She is accompanied by a friend today in clinic.   1. Diabetes:  Taking medications: Only taking natural medications. Last A1c was 6.3 and no medications have been prescribed. ROS: denies fever, chills, dizziness, LOC, polydipsia, numbness or tingling in extremities or chest pain. States her energy is low. Continues to have bladder overflow. Needs her toenails cut, always had hard toenails. Needs a foot doctor appointment. States she has never been to a podiatrist. She is not able to cut her nails and neither is her friend. Last eye exam was at [facility], about 4 months ago.  2. Hypertension Blood pressure at home: Takes it at home with home cuff and it is usually "just about normal" but shes unable to give me a number Blood pressure today: 151/54 Taking Meds: Vinegar and cranberry juice. No prescription medications ROS: Denies headache, visual changes, nausea, vomiting, chest pain, abdominal pain or shortness of breath.  3. Insomnia: Goes to sleep 9:30-10:30 and does not fall asleep until 1am and wakes up every 1 hour or so. She states she wakes up for the bathroom. She was on Oxybutynin and did not help much so she bought over the counter bladder medication which is helpful. She could not get the Remeron filled because of the strength I prescribed.   4. Arthritis pain: Hurts "all over." Worse in the last 2 weeks. Mostly in knees and shoulders. Takes OTC arthritis relief which helps. Walks with canes. No falls reported. No other changes. Her gait is abnormal and uses a cane and a crutch to walk. She states she would give anything to get some relief. Is using a stretch knee brace from the drug store  which does not help.   History Reviewed: Every day smoker. Health Maintenance: UTD  ROS: Please see HPI above.  Objective: Office vital signs reviewed. BP 151/54  Pulse 76  Temp(Src) 98.3 F (36.8 C) (Oral)  Wt 107 lb (48.535 kg)  BMI 18.36 kg/m2  Physical Examination:  General: Awake, alert. NAD. Sitting in wheelchair HEENT: Atraumatic, normocephalic. Dentures. MMM. Neck: No masses palpated. No LAD Pulm: CTAB, no wheezes Cardio: RRR, no murmurs appreciated Extremities: No edema. Deformity of lower extremities and TTP of knees. Feet examined, weak pulses palpated. Skin is dry. Toenails are thick and unevenly cut. No skin breakdown or calluses.  Neuro: Grossly intact  Assessment: 79 y.o. female follow up appointment  Plan: See Problem List and After Visit Summary

## 2013-04-29 NOTE — Patient Instructions (Signed)
It was good to see you today.  Since you are not taking any medication for your high blood pressure or diabetes, we will stay off of them.  We will check your labs today. I have sent in a new prescription for your sleep. Take half a tablet at night to sleep. For your pain, continue the arthritis pill and muscle rubs. I am sorry your pain is getting worse.  Expect a call from Sports Medicine for a gait evaluation.  Doyne Micke M. Jalin Erpelding, M.D.

## 2013-04-30 DIAGNOSIS — L608 Other nail disorders: Secondary | ICD-10-CM | POA: Insufficient documentation

## 2013-04-30 NOTE — Assessment & Plan Note (Signed)
A1C <7.0 without medication. Will stop checking A1C and no longer prescribe any medications.

## 2013-04-30 NOTE — Assessment & Plan Note (Signed)
Long standing issue now with more pain from osteoarthritis. Given her gait, will refer to sports medicine for evaluation and recommendations about insoles/braces/therapy to help with her knee pain. I am not optimistic about improvement in her pain given her advanced state, but patient was very thankful for another evaluation.

## 2013-04-30 NOTE — Assessment & Plan Note (Signed)
Not on any prescription medications and states she feels better. Will check routine labs today but not restart and medicines since her BP is close to goal for her age.

## 2013-04-30 NOTE — Assessment & Plan Note (Signed)
Patient with DM and thick toenails. Has never had formal podiatry evaluation. Will refer to podiatry for eval and treat, especially for toenail trim. Pt agrees with plan.

## 2013-04-30 NOTE — Assessment & Plan Note (Signed)
Rx for Remeron sent to pharmacy. She will take 1/2 tab at nighttime for sleep. Pt agrees.

## 2013-05-03 ENCOUNTER — Encounter: Payer: Self-pay | Admitting: Family Medicine

## 2013-05-03 NOTE — Addendum Note (Signed)
Addended by: Hilarie Fredrickson on: 05/03/2013 12:06 PM   Modules accepted: Orders

## 2013-05-06 ENCOUNTER — Telehealth: Payer: Self-pay | Admitting: Family Medicine

## 2013-05-06 NOTE — Telephone Encounter (Signed)
Patients friend dropped off form to be filled out for transportation.  Please call friend to pick up when completed.

## 2013-05-11 NOTE — Telephone Encounter (Signed)
Left message at [phone] that SCAT forms are ready to be picked up at front desk.  Ileana Ladd

## 2013-05-11 NOTE — Telephone Encounter (Signed)
Form completed and returned. Patient would need SCAT for her gait impairment as well as severe osteoarthritis. It is not safe for her to enter/exit bus in a rushed manor. She also cannot ambulate long distances to bus stops or cross the road safely.  Meredith Redwine M. Heli Dino, FuentesD. 05/11/2013 2:35 PM

## 2013-07-01 ENCOUNTER — Other Ambulatory Visit: Payer: Self-pay

## 2013-08-17 ENCOUNTER — Emergency Department (HOSPITAL_COMMUNITY): Payer: Medicare Other

## 2013-08-17 ENCOUNTER — Observation Stay (HOSPITAL_COMMUNITY)
Admission: EM | Admit: 2013-08-17 | Discharge: 2013-08-19 | Disposition: A | Discharge status: Skilled Nursing Facility | Payer: Medicare Other | Attending: Family Medicine | Admitting: Family Medicine

## 2013-08-17 ENCOUNTER — Encounter (HOSPITAL_COMMUNITY): Payer: Self-pay | Admitting: *Deleted

## 2013-08-17 DIAGNOSIS — G250 Essential tremor: Secondary | ICD-10-CM | POA: Insufficient documentation

## 2013-08-17 DIAGNOSIS — I6529 Occlusion and stenosis of unspecified carotid artery: Secondary | ICD-10-CM | POA: Diagnosis present

## 2013-08-17 DIAGNOSIS — R7303 Prediabetes: Secondary | ICD-10-CM | POA: Diagnosis present

## 2013-08-17 DIAGNOSIS — I10 Essential (primary) hypertension: Secondary | ICD-10-CM | POA: Insufficient documentation

## 2013-08-17 DIAGNOSIS — M1990 Unspecified osteoarthritis, unspecified site: Secondary | ICD-10-CM | POA: Insufficient documentation

## 2013-08-17 DIAGNOSIS — R55 Syncope and collapse: Secondary | ICD-10-CM

## 2013-08-17 DIAGNOSIS — R008 Other abnormalities of heart beat: Secondary | ICD-10-CM | POA: Diagnosis present

## 2013-08-17 DIAGNOSIS — R269 Unspecified abnormalities of gait and mobility: Secondary | ICD-10-CM

## 2013-08-17 DIAGNOSIS — Z79899 Other long term (current) drug therapy: Secondary | ICD-10-CM | POA: Insufficient documentation

## 2013-08-17 DIAGNOSIS — M129 Arthropathy, unspecified: Secondary | ICD-10-CM | POA: Insufficient documentation

## 2013-08-17 DIAGNOSIS — Z8673 Personal history of transient ischemic attack (TIA), and cerebral infarction without residual deficits: Secondary | ICD-10-CM | POA: Insufficient documentation

## 2013-08-17 DIAGNOSIS — I2510 Atherosclerotic heart disease of native coronary artery without angina pectoris: Secondary | ICD-10-CM

## 2013-08-17 DIAGNOSIS — Z7409 Other reduced mobility: Secondary | ICD-10-CM

## 2013-08-17 DIAGNOSIS — G4700 Insomnia, unspecified: Secondary | ICD-10-CM | POA: Insufficient documentation

## 2013-08-17 DIAGNOSIS — I491 Atrial premature depolarization: Secondary | ICD-10-CM | POA: Insufficient documentation

## 2013-08-17 DIAGNOSIS — M12819 Other specific arthropathies, not elsewhere classified, unspecified shoulder: Secondary | ICD-10-CM

## 2013-08-17 DIAGNOSIS — E119 Type 2 diabetes mellitus without complications: Secondary | ICD-10-CM | POA: Insufficient documentation

## 2013-08-17 DIAGNOSIS — E785 Hyperlipidemia, unspecified: Secondary | ICD-10-CM | POA: Insufficient documentation

## 2013-08-17 DIAGNOSIS — R32 Unspecified urinary incontinence: Secondary | ICD-10-CM | POA: Insufficient documentation

## 2013-08-17 DIAGNOSIS — F17200 Nicotine dependence, unspecified, uncomplicated: Secondary | ICD-10-CM | POA: Insufficient documentation

## 2013-08-17 DIAGNOSIS — R002 Palpitations: Principal | ICD-10-CM | POA: Insufficient documentation

## 2013-08-17 DIAGNOSIS — R413 Other amnesia: Secondary | ICD-10-CM

## 2013-08-17 DIAGNOSIS — I6789 Other cerebrovascular disease: Secondary | ICD-10-CM | POA: Diagnosis present

## 2013-08-17 DIAGNOSIS — I5032 Chronic diastolic (congestive) heart failure: Secondary | ICD-10-CM | POA: Insufficient documentation

## 2013-08-17 DIAGNOSIS — N3941 Urge incontinence: Secondary | ICD-10-CM | POA: Diagnosis present

## 2013-08-17 DIAGNOSIS — R0789 Other chest pain: Secondary | ICD-10-CM | POA: Insufficient documentation

## 2013-08-17 DIAGNOSIS — I252 Old myocardial infarction: Secondary | ICD-10-CM | POA: Insufficient documentation

## 2013-08-17 DIAGNOSIS — R0600 Dyspnea, unspecified: Secondary | ICD-10-CM

## 2013-08-17 DIAGNOSIS — M810 Age-related osteoporosis without current pathological fracture: Secondary | ICD-10-CM | POA: Insufficient documentation

## 2013-08-17 DIAGNOSIS — I4949 Other premature depolarization: Secondary | ICD-10-CM | POA: Insufficient documentation

## 2013-08-17 HISTORY — DX: Chronic diastolic (congestive) heart failure: I50.32

## 2013-08-17 LAB — CBC WITH DIFFERENTIAL/PLATELET
Basophils Absolute: 0 10*3/uL (ref 0.0–0.1)
Basophils Relative: 1 % (ref 0–1)
Eosinophils Absolute: 0 10*3/uL (ref 0.0–0.7)
MCH: 30.3 pg (ref 26.0–34.0)
MCHC: 35.1 g/dL (ref 30.0–36.0)
Neutrophils Relative %: 79 % — ABNORMAL HIGH (ref 43–77)
Platelets: 303 10*3/uL (ref 150–400)
RBC: 4.16 MIL/uL (ref 3.87–5.11)
RDW: 14.5 % (ref 11.5–15.5)

## 2013-08-17 LAB — BASIC METABOLIC PANEL
CO2: 27 mEq/L (ref 19–32)
Calcium: 9.6 mg/dL (ref 8.4–10.5)
Chloride: 106 mEq/L (ref 96–112)
Creatinine, Ser: 0.81 mg/dL (ref 0.50–1.10)
Glucose, Bld: 104 mg/dL — ABNORMAL HIGH (ref 70–99)
Sodium: 143 mEq/L (ref 135–145)

## 2013-08-17 LAB — GLUCOSE, CAPILLARY

## 2013-08-17 LAB — MAGNESIUM: Magnesium: 2.4 mg/dL (ref 1.5–2.5)

## 2013-08-17 MED ORDER — SODIUM CHLORIDE 0.9 % IV SOLN
INTRAVENOUS | Status: DC
  Administered 2013-08-18: via INTRAVENOUS

## 2013-08-17 MED ORDER — METOPROLOL TARTRATE 50 MG PO TABS
50.0000 mg | ORAL_TABLET | Freq: Two times a day (BID) | ORAL | Status: DC
  Administered 2013-08-17 – 2013-08-18 (×2): 50 mg via ORAL
  Filled 2013-08-17 (×4): qty 1

## 2013-08-17 MED ORDER — SODIUM CHLORIDE 0.9 % IV SOLN
Freq: Once | INTRAVENOUS | Status: AC
  Administered 2013-08-17: 17:00:00 via INTRAVENOUS

## 2013-08-17 MED ORDER — MIRTAZAPINE 15 MG PO TBDP
7.5000 mg | ORAL_TABLET | Freq: Every day | ORAL | Status: DC
  Administered 2013-08-17: 7.5 mg via ORAL
  Filled 2013-08-17 (×3): qty 0.5

## 2013-08-17 MED ORDER — DARIFENACIN HYDROBROMIDE ER 15 MG PO TB24
15.0000 mg | ORAL_TABLET | Freq: Every day | ORAL | Status: DC
  Filled 2013-08-17 (×2): qty 1

## 2013-08-17 MED ORDER — ACETAMINOPHEN 650 MG RE SUPP: 650.0000 mg | Freq: Four times a day (QID) | RECTAL | Status: DC | PRN

## 2013-08-17 MED ORDER — SODIUM CHLORIDE 0.9 % IJ SOLN: 3.0000 mL | Freq: Two times a day (BID) | INTRAMUSCULAR | Status: DC

## 2013-08-17 MED ORDER — HEPARIN SODIUM (PORCINE) 5000 UNIT/ML IJ SOLN
5000.0000 [IU] | Freq: Three times a day (TID) | INTRAMUSCULAR | Status: DC
  Administered 2013-08-17 – 2013-08-19 (×5): 5000 [IU] via SUBCUTANEOUS
  Filled 2013-08-17 (×8): qty 1

## 2013-08-17 MED ORDER — SIMVASTATIN 20 MG PO TABS
20.0000 mg | ORAL_TABLET | Freq: Every day | ORAL | Status: DC
  Administered 2013-08-18 – 2013-08-19 (×2): 20 mg via ORAL
  Filled 2013-08-17 (×3): qty 1

## 2013-08-17 MED ORDER — LISINOPRIL 5 MG PO TABS
5.0000 mg | ORAL_TABLET | Freq: Every day | ORAL | Status: DC
  Administered 2013-08-18 – 2013-08-19 (×2): 5 mg via ORAL
  Filled 2013-08-17 (×3): qty 1

## 2013-08-17 MED ORDER — NICOTINE POLACRILEX 2 MG MT GUM
2.0000 mg | CHEWING_GUM | OROMUCOSAL | Status: DC | PRN
  Filled 2013-08-17: qty 1

## 2013-08-17 MED ORDER — ACETAMINOPHEN 325 MG PO TABS: 650.0000 mg | ORAL_TABLET | Freq: Four times a day (QID) | ORAL | Status: DC | PRN

## 2013-08-17 MED ORDER — OXYBUTYNIN CHLORIDE ER 10 MG PO TB24
10.0000 mg | ORAL_TABLET | Freq: Every day | ORAL | Status: DC
  Filled 2013-08-17 (×2): qty 1

## 2013-08-17 MED ORDER — ASPIRIN 81 MG PO CHEW
81.0000 mg | CHEWABLE_TABLET | Freq: Every day | ORAL | Status: DC
  Administered 2013-08-18 – 2013-08-19 (×2): 81 mg via ORAL
  Filled 2013-08-17 (×2): qty 1

## 2013-08-17 MED ORDER — ONDANSETRON HCL 4 MG/2ML IJ SOLN: 4.0000 mg | Freq: Three times a day (TID) | INTRAMUSCULAR | Status: AC | PRN

## 2013-08-17 NOTE — ED Notes (Signed)
Pt voided but we did not get sample.

## 2013-08-17 NOTE — ED Provider Notes (Signed)
CSN: [id number]     Arrival date & time 08/17/13  1605 History   First MD Initiated Contact with Patient 08/17/13 1617     Chief Complaint  Patient presents with  . Palpitations   (Consider location/radiation/quality/duration/timing/severity/associated sxs/prior Treatment) HPI Comments: 79 y/o woman comes in with cc of palpitations. She has hx of CAD, HTN. Pt reports having intermittent palpitations in the past, but for the past 2 days, she has been having more constant palpitations, and feels like she might pass out and has some dib. Pt has no chest pain, but does insist that she has some vague discomfort in the chest area as well. She denies any new meds, no hx of PE, DVT, and she lives by herself and is quite independent. She denies any recent infections.  The history is provided by the patient.    Past Medical History  Diagnosis Date  . CVA (cerebral vascular accident)   . CAD (coronary artery disease)   . Vitamin D deficiency   . Diabetes mellitus, type 2   . HTN (hypertension)   . HLD (hyperlipidemia)   . Tremor, essential   . Urinary incontinence   . DIABETES-TYPE 2 07/16/2007  . HYPERLIPIDEMIA 03/03/2007  . TOBACCO USER 12/25/2009  . HYPERTENSION, ESSENTIAL NOS 08/07/2007  . CAROTID ARTERY DISEASE 07/16/2007  . C V A/STROKE 07/16/2007  . Palpitations 02/08/2010  . High risk social situations 11/28/2011  . Intention tremor 03/03/2007    Qualifier: Diagnosis of  By: Dorathy Daft  MD, IDAYLIS    . Syncope, near 02/28/2012  . Altered mental status 01/01/2011    Qualifier: Diagnosis of  By: Earnest Bailey MD, Selena Batten    . DEFICIENCY, VITAMIN D NOS 07/16/2007    Qualifier: Diagnosis of  By: Irving Burton MD, Clifton Custard    . Myocardial infarction   . Angina   . Heart murmur   . Asthma   . Shortness of breath   . Pneumonia   . Recurrent upper respiratory infection (URI)   . Hyperthyroidism   . Anemia   . Chronic kidney disease   . H/O hiatal hernia   . Seizures   . Headache   . Neuromuscular  disorder   . Arthritis   . Fibromyalgia    Past Surgical History  Procedure Laterality Date  . Appendectomy  1965  . Vaginal hysterectomy  1965  . Knee surgery  1989, 1995   Family History  Problem Relation Age of Onset  . Diabetes Mother   . Diabetes Brother   . Stroke Father    History  Substance Use Topics  . Smoking status: Current Every Day Smoker -- 0.50 packs/day    Types: Cigarettes  . Smokeless tobacco: Never Used     Comment: does not smoke much...  . Alcohol Use: No   OB History   Grav Para Term Preterm Abortions TAB SAB Ect Mult Living                 Review of Systems  Constitutional: Positive for fatigue. Negative for activity change.  HENT: Negative for neck pain.   Respiratory: Positive for chest tightness and shortness of breath. Negative for cough and wheezing.   Cardiovascular: Positive for palpitations. Negative for chest pain.  Gastrointestinal: Negative for nausea, vomiting and abdominal pain.  Genitourinary: Negative for dysuria.  Neurological: Negative for headaches.    Allergies  Codeine and Penicillins  Home Medications   Current Outpatient Rx  Name  Route  Sig  Dispense  Refill  .  acetaminophen (TYLENOL ARTHRITIS PAIN) 650 MG CR tablet   Oral   Take 1 tablet (650 mg total) by mouth every 8 (eight) hours. For arthritis pain.   90 tablet   5   . aspirin 81 MG chewable tablet   Oral   Chew 81 mg by mouth daily.         . Black Cohosh 160 MG CAPS   Oral   Take 160 mg by mouth daily.          [name] CALCIUM PO   Oral   Take 1 tablet by mouth 2 (two) times daily.          . Cyanocobalamin (B-12) 100 MCG TABS   Oral   Take 100 mcg by mouth daily.          [name] lisinopril (PRINIVIL,ZESTRIL) 5 MG tablet   Oral   Take 1 tablet (5 mg total) by mouth daily.   90 tablet   1   . meloxicam (MOBIC) 7.5 MG tablet   Oral   Take 1 tablet (7.5 mg total) by mouth daily.   30 tablet   0   . metoprolol (LOPRESSOR) 50 MG tablet    Oral   Take 1 tablet (50 mg total) by mouth 2 (two) times daily.   180 tablet   1   . mirtazapine (REMERON SOL-TAB) 15 MG disintegrating tablet   Oral   Take 0.5 tablets (7.5 mg total) by mouth at bedtime.   30 tablet   11   . oxybutynin (DITROPAN-XL) 10 MG 24 hr tablet   Oral   Take 1 tablet (10 mg total) by mouth daily.   30 tablet   5   . simvastatin (ZOCOR) 20 MG tablet   Oral   Take 20 mg by mouth daily.         . solifenacin (VESICARE) 10 MG tablet   Oral   Take 1 tablet (10 mg total) by mouth daily.   30 tablet   5    BP 166/86  Pulse 100  Temp(Src) 98.9 F (37.2 C) (Oral)  Resp 15  Ht 5\' 3"  (1.6 m)  Wt 107 lb (48.535 kg)  BMI 18.96 kg/m2  SpO2 100% Physical Exam  Nursing note and vitals reviewed. Constitutional: She is oriented to person, place, and time. She appears well-developed and well-nourished.  HENT:  Head: Normocephalic and atraumatic.  Eyes: EOM are normal. Pupils are equal, round, and reactive to light.  Neck: Neck supple. No JVD present.  Cardiovascular: Normal rate, regular rhythm and normal heart sounds.   No murmur heard. Pulmonary/Chest: Effort normal. No respiratory distress.  Abdominal: Soft. She exhibits no distension. There is no tenderness. There is no rebound and no guarding.  Neurological: She is alert and oriented to person, place, and time.  Skin: Skin is warm and dry.    ED Course  Procedures (including critical care time) Labs Review Labs Reviewed  CBC WITH DIFFERENTIAL  BASIC METABOLIC PANEL  TROPONIN I  URINALYSIS, ROUTINE W REFLEX MICROSCOPIC   Imaging Review No results found.  MDM  No diagnosis found.   Date: 08/17/2013  Rate: 83  Rhythm: normal sinus rhythm and premature ventricular contractions (PVC)  QRS Axis: normal  Intervals: normal  ST/T Wave abnormalities: nonspecific ST/T changes  Conduction Disutrbances:none  Narrative Interpretation:   Old EKG Reviewed: changes noted - trigeminy  Pt comes  in with cc of palpitations and noted to be in ventricular trigeminy. Pt states that  she has some dib and feels like she might pass out.  We will get basic labs to assess if there is any underlying etiology, and continue cardiac monitoring here. Will admit, only because she is symptomatic, lives by herself, and will benefit from possible uptitration of her meds.  She has a nef PE workup last year, and EF is WNL.     Derwood Kaplan, MD 08/17/13 1751

## 2013-08-17 NOTE — H&P (Signed)
Family Medicine Teaching [facility] Admission History and Physical Service Pager: [phone]  Patient name: [name] Medical record number: [id number] Date of birth: Oct 11, 1920 Age: 79 y.o. Gender: female  Primary Care Provider: Rodman Pickle, MD Consultants: none to date Code Status: full code  Chief Complaint: palpitations  Assessment and Plan: [name] is a 79 y.o. year old female presenting with palpitations and chest discomfort for the past day or two. PMH significant for CVA, reported MI "three times" in the past, type II diabetes, HTN, HLD, chronic diastolic heart failure, osteoarthritis, and current cigarette use.  # Palpitations / irregular heartbeat - no definite rhythm, but difficult to tell if beats are PAC's/PVC's vs a fib - troponin negative x1 in the ED, EKG suggestive of trigeminy - no definite history of a fib, on metoprolol 50 mg BID at home - continue metoprolol, consider increase based on heart rate and reponse [ ]  f/u repeat troponin [ ]  f/u repeat EKG [ ]  consider cardiology consult (?pacemaker, etc, though doubt pt is a good surgical candidate) [ ]  consider repeat echo  # Chronic diastolic heart failure - EF 55-60% in January 2013, grade 2 diastolic dysfunction - no current evidence for fluid overload clinically or on CXR - continue metoprolol, ASA - will be judicious with fluid resuscitation; 50 mL/h only for now to support PO intake [ ]  consider repeat echo, as above  # HTN - not on meds other than metoprolol at home; pt takes some herbal medications, vinegar, at home - will monitor BP here, though for age, pt is likely at an appropriate range (generally ~150's/80's)  # HLD - on Zocor 20 mg at home, continue  # DM type II - per recent outpt note, last A1c 6.4 in May 2014, without plan to recheck A1c or treat with meds - defer recheck A1c for now; glucose 160 in the ED - given age and recent decision by PCP and pt to defer further  checks/treatment, will likely not start treatment, now  # Insomnia - long-standing; on Remeron qHS, continue  # Chronic arthritis - Tylenol PRN; holding home Mobic given palpitations and investigation of heart, as above  # Chronic urinary incontinence - pt on both oxybutynin and darifenacin at home; continue both - need to consider removing one agent or the other, though can defer to outpt, likely  # Current tobacco abuse - not interested in quitting, currently; has tried patches in the past but did not like them - nicotine gum PRN  FEN/GI: heart healthy diet, NS at 50 mL/h Prophylaxis: subQ heparin  Disposition: placing in observation, telemetry unit, attending Dr. McDiarmid, with management as above  History of Present Illness: [name] is a 79 y.o. year old female presenting with palpitations and chest discomfort for the past day or two. PMH significant for CVA, reported MI "three times" in the past, type II diabetes, HTN, HLD, chronic diastolic heart failure, osteoarthritis, and current cigarette use. Pt reports palpitations and chest discomfort but no frank chest pain, for the past day or so; pt reports that her heart was "beating out of her chest enough to make her blouse move at home," and a visiting friend made her call the ambulance. She denies shortness of breath. She states she is compliant with medications at home, but lives alone, and "doesn't get around much" on her own. Denies cough, fever/chills, N/V, change in bowel habits. She states she has had palpitations in the past, but not as  frequent or as sustained as currently, and states she has felt "faint" a couple of times over the past day. She feels better at the time of this interview than she did on presentation to the ED. She does admit to current smoking, up to 5 cigarettes per day or fewer, "usually when she watches the news or gets done eating."  Review Of Systems: Per HPI. Generally feels "okay," and better than  when she arrived to the hospital. Otherwise 12 point review of systems was performed and was unremarkable.  Patient Active Problem List   Diagnosis Date Noted  . Palpitations 08/17/2013  . Chest discomfort 08/17/2013  . Trigeminy 08/17/2013  . Toenail deformity 04/30/2013  . Memory loss 09/24/2012  . Insomnia 03/12/2012  . Mobility impaired 01/07/2012  . Diastolic CHF, chronic 12/31/2011  . CAD (coronary artery disease) 12/28/2011  . TOBACCO USER 12/25/2009  . Abnormality of gait 07/20/2009  . OSTEOPOROSIS 10/01/2007  . HYPERTENSION, ESSENTIAL NOS 08/07/2007  . DIABETES-TYPE 2 07/16/2007  . CAROTID ARTERY DISEASE 07/16/2007  . C V A/STROKE 07/16/2007  . URINARY INCONTINENCE, URGE 07/16/2007  . HX, PERSONAL, THROMBOPHLEBITIS 07/16/2007  . HYPERLIPIDEMIA 03/03/2007  . ARTHROPATHY NEC, SHOULDER 03/03/2007  . ARTHRITIS, KNEES, BILATERAL 03/03/1979   Past Medical History: Past Medical History  Diagnosis Date  . CVA (cerebral vascular accident)   . CAD (coronary artery disease)   . Vitamin D deficiency   . Diabetes mellitus, type 2   . HTN (hypertension)   . HLD (hyperlipidemia)   . Tremor, essential   . Urinary incontinence   . DIABETES-TYPE 2 07/16/2007  . HYPERLIPIDEMIA 03/03/2007  . TOBACCO USER 12/25/2009  . HYPERTENSION, ESSENTIAL NOS 08/07/2007  . CAROTID ARTERY DISEASE 07/16/2007  . C V A/STROKE 07/16/2007  . Palpitations 02/08/2010  . High risk social situations 11/28/2011  . Intention tremor 03/03/2007    Qualifier: Diagnosis of  By: Dorathy Daft  MD, IDAYLIS    . Syncope, near 02/28/2012  . Altered mental status 01/01/2011    Qualifier: Diagnosis of  By: Earnest Bailey MD, Selena Batten    . DEFICIENCY, VITAMIN D NOS 07/16/2007    Qualifier: Diagnosis of  By: Irving Burton MD, Clifton Custard    . Myocardial infarction   . Angina   . Heart murmur   . Asthma   . Shortness of breath   . Pneumonia   . Recurrent upper respiratory infection (URI)   . Hyperthyroidism   . Anemia   . Chronic kidney  disease   . H/O hiatal hernia   . Seizures   . Headache(784.0)   . Neuromuscular disorder   . Arthritis   . Fibromyalgia   . CAROTID ARTERY DISEASE 07/16/2007  . Diastolic CHF, chronic 12/31/2011  . TOBACCO USER 12/25/2009   Past Surgical History: Past Surgical History  Procedure Laterality Date  . Appendectomy  1965  . Vaginal hysterectomy  1965  . Knee surgery  1989, 1995   Social History: History  Substance Use Topics  . Smoking status: Current Every Day Smoker -- 0.50 packs/day    Types: Cigarettes  . Smokeless tobacco: Never Used     Comment: does not smoke much...  . Alcohol Use: No   Additional social history: Pt lives alone, no family in the area. Few friends from church check on her "occasionally." Please also refer to relevant sections of EMR.  Family History: Family History  Problem Relation Age of Onset  . Diabetes Mother   . Diabetes Brother   . Stroke Father  Allergies and Medications: Allergies  Allergen Reactions  . Codeine     Unsure of reaction  . Penicillins     Unsure of reaction    No current facility-administered medications on file prior to encounter.   Current Outpatient Prescriptions on File Prior to Encounter  Medication Sig Dispense Refill  . acetaminophen (TYLENOL ARTHRITIS PAIN) 650 MG CR tablet Take 1 tablet (650 mg total) by mouth every 8 (eight) hours. For arthritis pain.  90 tablet  5  . aspirin 81 MG chewable tablet Chew 81 mg by mouth daily.      . Black Cohosh 160 MG CAPS Take 160 mg by mouth daily.       [name] CALCIUM PO Take 1 tablet by mouth 2 (two) times daily.       . Cyanocobalamin (B-12) 100 MCG TABS Take 100 mcg by mouth daily.       [name] lisinopril (PRINIVIL,ZESTRIL) 5 MG tablet Take 1 tablet (5 mg total) by mouth daily.  90 tablet  1  . meloxicam (MOBIC) 7.5 MG tablet Take 1 tablet (7.5 mg total) by mouth daily.  30 tablet  0  . metoprolol (LOPRESSOR) 50 MG tablet Take 1 tablet (50 mg total) by mouth 2 (two) times daily.  180  tablet  1  . mirtazapine (REMERON SOL-TAB) 15 MG disintegrating tablet Take 0.5 tablets (7.5 mg total) by mouth at bedtime.  30 tablet  11  . oxybutynin (DITROPAN-XL) 10 MG 24 hr tablet Take 1 tablet (10 mg total) by mouth daily.  30 tablet  5  . simvastatin (ZOCOR) 20 MG tablet Take 20 mg by mouth daily.      . solifenacin (VESICARE) 10 MG tablet Take 1 tablet (10 mg total) by mouth daily.  30 tablet  5  . [DISCONTINUED] albuterol (PROVENTIL) (2.5 MG/3ML) 0.083% nebulizer solution Take 3 mLs (2.5 mg total) by nebulization every 6 (six) hours as needed for wheezing.  75 mL  12    Objective: BP 150/80  Pulse 82  Temp(Src) 98 F (36.7 C) (Axillary)  Resp 18  Ht 5\' 3"  (1.6 m)  Wt 106 lb (48.081 kg)  BMI 18.78 kg/m2  SpO2 99% Exam: General: elderly female in NAD, pleasant and cooperative with exam/interview HEENT: [place], AT, EOMI, PERRLA, mucous membranes moist, TMs clear bilaterally Cardiovascular: rate normal but rhythm irregular (?groups of three beats), difficult to appreciate any murmur Respiratory: CTAB, no wheezes, normal WOB Abdomen: soft, nontender, BS+ Extremities: warm, well-perfused; bilateral feet with toenails uneven/thickened, but no obvious ulcerations Skin: warm, dry; LE and feet with chronic dryness/flaking, but no rash appreciated Neuro: alert, oriented x4, no gross focal deficit  Labs and Imaging: CBC BMET   Recent Labs Lab 08/17/13 1629  WBC 8.6  HGB 12.6  HCT 35.9*  PLT 303    Recent Labs Lab 08/17/13 1629  NA 143  K 3.9  CL 106  CO2 27  BUN 18  CREATININE 0.81  GLUCOSE 104*  CALCIUM 9.6     Serum troponin <0.30 CXR 8/26 @1759 : cardiac enlargement but no acute abnormality  Bobbye Morton, MD 08/17/2013, 11:03 PM PGY-2, [facility] FPTS Intern pager: [phone], text pages welcome

## 2013-08-17 NOTE — ED Notes (Signed)
Pt. Would not stand for her orthostatic vitals.

## 2013-08-17 NOTE — ED Notes (Addendum)
According to EMS, patient says she started having palpitation since this morning.  Patient called EMS because she decided to come be evaluated.  She did advise EMS that she is non-compliant with her medication.  She denies pain, SOB, palpitations, N/V, or LOC.  EMS placed an IV in her left forearm and transported her here.

## 2013-08-18 ENCOUNTER — Encounter (HOSPITAL_COMMUNITY): Payer: Self-pay | Admitting: *Deleted

## 2013-08-18 ENCOUNTER — Other Ambulatory Visit: Payer: Self-pay | Admitting: Physician Assistant

## 2013-08-18 DIAGNOSIS — R55 Syncope and collapse: Secondary | ICD-10-CM

## 2013-08-18 DIAGNOSIS — R0609 Other forms of dyspnea: Secondary | ICD-10-CM

## 2013-08-18 DIAGNOSIS — I059 Rheumatic mitral valve disease, unspecified: Secondary | ICD-10-CM

## 2013-08-18 DIAGNOSIS — R0989 Other specified symptoms and signs involving the circulatory and respiratory systems: Secondary | ICD-10-CM

## 2013-08-18 LAB — PRO B NATRIURETIC PEPTIDE: Pro B Natriuretic peptide (BNP): 641.4 pg/mL — ABNORMAL HIGH (ref 0–450)

## 2013-08-18 LAB — CBC
HCT: 34.6 % — ABNORMAL LOW (ref 36.0–46.0)
MCH: 29.5 pg (ref 26.0–34.0)
MCV: 88 fL (ref 78.0–100.0)
Platelets: 278 10*3/uL (ref 150–400)
RDW: 14.7 % (ref 11.5–15.5)

## 2013-08-18 LAB — URINALYSIS, ROUTINE W REFLEX MICROSCOPIC
Bilirubin Urine: NEGATIVE
Leukocytes, UA: NEGATIVE
Nitrite: NEGATIVE
Specific Gravity, Urine: 1.012 (ref 1.005–1.030)
Urobilinogen, UA: 0.2 mg/dL (ref 0.0–1.0)

## 2013-08-18 LAB — GLUCOSE, CAPILLARY
Glucose-Capillary: 100 mg/dL — ABNORMAL HIGH (ref 70–99)
Glucose-Capillary: 100 mg/dL — ABNORMAL HIGH (ref 70–99)

## 2013-08-18 LAB — BASIC METABOLIC PANEL
CO2: 26 mEq/L (ref 19–32)
Calcium: 8.8 mg/dL (ref 8.4–10.5)
Chloride: 109 mEq/L (ref 96–112)
Creatinine, Ser: 0.84 mg/dL (ref 0.50–1.10)
Glucose, Bld: 92 mg/dL (ref 70–99)

## 2013-08-18 MED ORDER — METOPROLOL TARTRATE 50 MG PO TABS
75.0000 mg | ORAL_TABLET | Freq: Two times a day (BID) | ORAL | Status: DC
  Administered 2013-08-18: 75 mg via ORAL
  Filled 2013-08-18 (×3): qty 1

## 2013-08-18 MED ORDER — MIRTAZAPINE 7.5 MG PO TABS
7.5000 mg | ORAL_TABLET | Freq: Every day | ORAL | Status: DC
  Administered 2013-08-18: 7.5 mg via ORAL
  Filled 2013-08-18 (×2): qty 1

## 2013-08-18 NOTE — Evaluation (Signed)
Physical Therapy Evaluation Patient Details Name: [name] MRN: [id number] DOB: September 28, 1920 Today's Date: 08/18/2013 Time: 6578-4696 PT Time Calculation (min): 27 min  PT Assessment / Plan / Recommendation History of Present Illness  79 yo F adm with palpitations and chest discomfort for the past day or two. PMH significant for CVA, reported MI "three times" in the past, type II diabetes, HTN, HLD, chronic diastolic heart failure, osteoarthritis, and current cigarette use.  Clinical Impression  Pt demonstrates deficits in functional mobility and safety awareness as indicated below. Pt will benefit from continued skilled PT to address deficits and maximize function. At this time, do not feel patient is safe to return home. Will need follow up SNF at discharge.  Additionally, patient hygiene appears to be of concern, patient with foul odor, disheveled appearance and had many visible bugs flying around her in the room. Will continue to see as indicated to faciliate dc to SNF.    PT Assessment  Patient needs continued PT services    Follow Up Recommendations  SNF       Barriers to Discharge Decreased caregiver support      Equipment Recommendations  Rolling walker with 5" wheels (junior walker)    Recommendations for Other Services     Frequency Min 2X/week    Precautions / Restrictions Precautions Precautions: Fall Restrictions Weight Bearing Restrictions: No   Pertinent Vitals/Pain Patient reports knee pain 4/10      Mobility  Bed Mobility Bed Mobility: Supine to Sit;Sitting - Scoot to [place] of Bed;Sit to Supine Supine to Sit: 5: Supervision Sitting - Scoot to Edge of Bed: 5: Supervision Sit to Supine: 5: Supervision Details for Bed Mobility Assistance: No physical assist needed Transfers Transfers: Sit to Stand;Stand to Sit Sit to Stand: 4: Min assist Stand to Sit: 5: Supervision Details for Transfer Assistance: Multiple attempts on her own to stand, multiple  cues for technique and positioning, assist to stabilize instanding to prevent fall backwards Ambulation/Gait Ambulation/Gait Assistance: 4: Min assist Ambulation Distance (Feet): 46 Feet (8 ft with bilateral canes (not safe)) Assistive device: Rolling walker Ambulation/Gait Assistance Details: patient very unsteady, multiple multi modal cues for safety with ambulation, patient unable to use bilateral canes safely, difficulty with use of rw Gait Pattern: Step-to pattern;Decreased stride length;Left circumduction;Right foot flat;Left foot flat;Shuffle;Lateral hip instability;Trunk flexed;Wide base of support Gait velocity: decreased significantly General Gait Details: patient very unsafe with ambulation        PT Diagnosis: Difficulty walking;Abnormality of gait;Generalized weakness  PT Problem List: Decreased strength;Decreased range of motion;Decreased activity tolerance;Decreased balance;Decreased mobility;Decreased coordination;Decreased cognition;Decreased knowledge of use of DME PT Treatment Interventions: DME instruction;Gait training;Stair training;Functional mobility training;Therapeutic activities;Therapeutic exercise;Balance training;Patient/family education     PT Goals(Current goals can be found in the care plan section) Acute Rehab PT Goals Patient Stated Goal: none stated PT Goal Formulation: With patient Time For Goal Achievement: 09/01/13 Potential to Achieve Goals: Fair  Visit Information  Last PT Received On: 08/18/13 Assistance Needed: +1 History of Present Illness: 79 yo F adm with palpitations and chest discomfort for the past day or two. PMH significant for CVA, reported MI "three times" in the past, type II diabetes, HTN, HLD, chronic diastolic heart failure, osteoarthritis, and current cigarette use.       Prior Functioning  Home Living Family/patient expects to be discharged to:: Private residence Living Arrangements: Alone Available Help at Discharge: Other  (Comment) (church member helps 1 day/week with laundry/housework/groc) Type of Home: House Home Access: Stairs  to enter Entrance Stairs-Number of Steps: 2 Entrance Stairs-Rails: None;Right Home Layout: One level Home Equipment: Cane - single point;Shower seat;Bedside commode (uses 2 single point canes to ambulate) Prior Function Level of Independence: Independent with assistive device(s) Comments: with BADLs, gets Meals on Wheels daily Communication Communication: No difficulties Dominant Hand: Right    Cognition  Cognition Arousal/Alertness: Awake/alert Behavior During Therapy: WFL for tasks assessed/performed Overall Cognitive Status: No family/caregiver present to determine baseline cognitive functioning Area of Impairment: Safety/judgement Memory: Decreased short-term memory Safety/Judgement: Decreased awareness of safety;Decreased awareness of deficits    Extremity/Trunk Assessment Upper Extremity Assessment Upper Extremity Assessment: Defer to OT evaluation Lower Extremity Assessment Lower Extremity Assessment: Generalized weakness;LLE deficits/detail LLE Deficits / Details: significant bowing at the Knee LLE Coordination: decreased fine motor;decreased gross motor   Balance Balance Balance Assessed: Yes Static Sitting Balance Static Sitting - Balance Support: Feet supported Static Sitting - Level of Assistance: 7: Independent Static Standing Balance Static Standing - Balance Support: Bilateral upper extremity supported Static Standing - Level of Assistance: 5: Stand by assistance High Level Balance High Level Balance Activites: Side stepping;Backward walking;Direction changes;Turns High Level Balance Comments: mod assist and max VCs  End of Session PT - End of Session Equipment Utilized During Treatment: Gait belt Activity Tolerance: Patient limited by fatigue;Patient limited by pain Patient left: in bed;with call bell/phone within reach;with bed alarm set Nurse  Communication: Mobility status  GP     Fabio Asa 08/18/2013, 11:24 AM Charlotte Crumb, PT DPT  [phone]

## 2013-08-18 NOTE — Progress Notes (Signed)
CSW Proofreader) spoke with pt again about recommendations for dc. Pt is understanding in agreement that dc home alone may not be safe. Pt is agreeable to being referred out to SNFs in [facility]. Pt first choice facility is [facility] if bed is available.  Trenten Watchman, LCSWA [phone]

## 2013-08-18 NOTE — Progress Notes (Addendum)
Clinical Social Work Department BRIEF PSYCHOSOCIAL ASSESSMENT 08/18/2013  Patient:  [name], [name]     Account Number:  [id number]     Admit date:  08/17/2013  Clinical Social Worker:  Harless Nakayama  Date/Time:  08/18/2013 11:20 AM  Referred by:  Physician  Date Referred:  08/18/2013 Referred for  ALF Placement   Other Referral:   Interview type:  Patient Other interview type:   CSW spoke with pt in pt room    PSYCHOSOCIAL DATA Living Status:  ALONE Admitted from facility:   Level of care:   Primary support name:   Primary support relationship to patient:   Degree of support available:   Pt reports having no family for support but does have church members who offer support.    CURRENT CONCERNS Current Concerns  Post-Acute Placement   Other Concerns:    SOCIAL WORK ASSESSMENT / PLAN CSW informed that pt may be in need of ALF or SNF at dc. CSW spoke with pt about recommendations. Pt is not agreeable to SNF at this time. Pt is open to ALF however pt would need to pay privately. Pt says this is not an option at this time. CSW asked pt if she receives any support or assistance at home right now and pt stated that she has a church member, Ms. Mantha, who has set up some "things" for her such as help getting her groceries. Pt unable to provide CSW with contact information for [facility] however did provide with church phone number [phone]) and asked CSW to let the church know she is in the hospital and receive contact information for Ms. Mantha from the church.     CSW was able to get in contact with Ms. Mantha [phone]). Ms Gailen [employer] visits the pt once a week and helps with bills, groceries, etc... CSW informed that pt church cannot make any guarantees but they will let church members know that it may be beneficial for a visitation schedule to be set up so that pt is not alone for full days after dc from the hospital. Pt is requesting to dc home. CSW has informed RN CM so  that pt can posisbly be set up with home health services. CSW has also notified Chaplain about Advance Directive consult. CSW signing off, please reconsult if other needs arise.   Assessment/plan status:  No Further Intervention Required Other assessment/ plan:   Information/referral to community resources:   None needed at this time    PATIENT'S/FAMILY'S RESPONSE TO PLAN OF CARE: Pt is understanding that it will be beneficial to get a Medicaid application completed and then look into ALF from home. Pt friend/church member is also understanding of this.     Phylis Javed, LCSWA [phone]

## 2013-08-18 NOTE — Care Management Note (Signed)
    Page 1 of 1   08/18/2013     2:12:20 PM   CARE MANAGEMENT NOTE 08/18/2013  Patient:  [name], [name]   Account Number:  [id number]  Date Initiated:  08/18/2013  Documentation initiated by:  GRAVES-BIGELOW,Ferrell Flam  Subjective/Objective Assessment:   Pt admitted with palpitations. PT recommends SNF and pt is agreeable. No further needs form CM at this time. Pt will not need a 3 day qualifying stay for this insurance. CSW [place] working with family for disposition needs.     Action/Plan:   Pt has DME- RW, Cane and WC. No DME needs at this time.   Anticipated DC Date:  08/19/2013   Anticipated DC Plan:  SKILLED NURSING FACILITY      DC Planning Services  CM consult      Choice offered to / List presented to:             Status of service:  Completed, signed off Medicare Important Message given?   (If response is "NO", the following Medicare IM given date fields will be blank) Date Medicare IM given:   Date Additional Medicare IM given:    Discharge Disposition:  SKILLED NURSING FACILITY  Per UR Regulation:  Reviewed for med. necessity/level of care/duration of stay  If discussed at Long Length of Stay Meetings, dates discussed:    Comments:

## 2013-08-18 NOTE — Progress Notes (Signed)
Asked to arrange event monitor. Tele reviewed and shows NSR with PVC's/PAC's. Note of Dr McDiarmid reviewed. Will arrange outpatient 3 week event monitor for evaluation of presyncope, palpitations, and frequent PVC's - office will be in touch with instructions. I will be happy to review the monitor when it results. Please notify the office if you want the patient to have formal cardiology consultation. Thank you.   [name] 08/18/2013 5:37 PM Pg [phone]

## 2013-08-18 NOTE — Progress Notes (Signed)
FMTS Attending Daily Note:  Renold Don MD  [phone] pager  Family Practice pager:  [phone] I have discussed this patient with the resident Dr. Althea Charon and attending physician Dr. McDiarmid.  I agree with their findings, assessment, and care plan

## 2013-08-18 NOTE — Progress Notes (Signed)
Occupational Therapy Evaluation Patient Details Name: [name] MRN: [id number] DOB: [birth year] Today's Date: 08/18/2013 Time: 0865-7846 OT Time Calculation (min): 20 min  OT Assessment / Plan / Recommendation History of present illness 79 yo F adm with palpitations and chest discomfort for the past day or two. PMH significant for CVA, reported MI "three times" in the past, type II diabetes, HTN, HLD, chronic diastolic heart failure, osteoarthritis, and current cigarette use.   Clinical Impression   Patient presents to OT with decreased ADL independence and safety. Patient appears dissheveled with poor hygiene. ? If she is able to care for herself appropriately at home. OT will follow to maximize independence and facilitate safe discharge.    OT Assessment  Patient needs continued OT Services    Follow Up Recommendations  SNF;Other (comment) (ALF)    Barriers to Discharge Decreased caregiver support    Equipment Recommendations  3 in 1 bedside comode    Recommendations for Other Services    Frequency  Min 2X/week    Precautions / Restrictions Precautions Precautions: Fall Restrictions Weight Bearing Restrictions: No   Pertinent Vitals/Pain No c/o pain    ADL  Eating/Feeding: Performed;Set up Where Assessed - Eating/Feeding: Edge of bed Grooming: Performed;Wash/dry hands;Set up Where Assessed - Grooming: Unsupported sitting Toilet Transfer: Performed;Minimal assistance Toilet Transfer Method: Sit to stand Toilet Transfer Equipment: Regular height toilet;Grab bars Toileting - Clothing Manipulation and Hygiene: Performed;Moderate assistance Where Assessed - Toileting Clothing Manipulation and Hygiene: Sit to stand from 3-in-1 or toilet Transfers/Ambulation Related to ADLs: Patient performed bed mobility with S, transfers with min A. Uses 2 single point canes for ambulation in room to and from bathroom ADL Comments: Patient appears dissheveled, has body odor, ? if  she is able to care for herself appropriately at home. Reports she gets Meals on Wheels daily and church member comes 1x/week to assist with laundry, housework, and groceries    OT Diagnosis: Generalized weakness  OT Problem List: Decreased strength;Impaired balance (sitting and/or standing);Decreased safety awareness OT Treatment Interventions: Self-care/ADL training;Therapeutic exercise;DME and/or AE instruction;Therapeutic activities;Patient/family education   OT Goals(Current goals can be found in the care plan section) Acute Rehab OT Goals Patient Stated Goal: none stated OT Goal Formulation: With patient Time For Goal Achievement: 09/01/13 Potential to Achieve Goals: Good  Visit Information  Last OT Received On: 08/18/13 Assistance Needed: +1 History of Present Illness: 79 yo F adm with palpitations and chest discomfort for the past day or two. PMH significant for CVA, reported MI "three times" in the past, type II diabetes, HTN, HLD, chronic diastolic heart failure, osteoarthritis, and current cigarette use.       Prior Functioning     Home Living Family/patient expects to be discharged to:: Private residence Living Arrangements: Alone Available Help at Discharge: Other (Comment) (church member helps 1 day/week with laundry/housework/groc) Type of Home: House Home Access: Stairs to enter [employer] of Steps: 2 Entrance Stairs-Rails: None;Right Home Layout: One level Home Equipment: Cane - single point;Shower seat;Bedside commode (uses 2 single point canes to ambulate) Prior Function Level of Independence: Independent with assistive device(s) Comments: with BADLs, gets Meals on Wheels daily Communication Communication: No difficulties Dominant Hand: Right         Vision/Perception Vision - History Baseline Vision: Wears glasses only for reading Patient Visual Report: No change from baseline   Cognition  Cognition Arousal/Alertness:  Awake/alert Behavior During Therapy: WFL for tasks assessed/performed Overall Cognitive Status: Within Functional Limits for tasks assessed  Extremity/Trunk Assessment Upper Extremity Assessment Upper Extremity Assessment: Overall WFL for tasks assessed (Shoulder flexion limited to 90 degrees bilaterally) Lower Extremity Assessment Lower Extremity Assessment: Defer to PT evaluation     Mobility       Exercise     Balance     End of Session OT - End of Session Equipment Utilized During Treatment: Other (comment) (2 single point canes) Activity Tolerance: Patient tolerated treatment well Patient left: in bed;with call bell/phone within reach;with bed alarm set Nurse Communication: Mobility status  GO Functional Limitation: Self care Self Care Current Status (Z6109): At least 40 percent but less than 60 percent impaired, limited or restricted Self Care Goal Status (U0454): At least 1 percent but less than 20 percent impaired, limited or restricted   [name] A 08/18/2013, 9:18 AM

## 2013-08-18 NOTE — Progress Notes (Signed)
Chaplain discussed advance directive with patient and the patient did not wish to complete the advance directive at this time. The chaplain will follow up as needed.   08/18/13 1300  Clinical Encounter Type  Visited With Patient;Health care provider  Visit Type Spiritual support;Other (Comment)  Referral From Social work Engineer, building services)  Spiritual Encounters  Spiritual Needs Emotional  Stress Factors  Patient Stress Factors None identified  Advance Directives (For Healthcare)  Advance Directive Patient does not have advance directive;Patient would not like information  Type of Advance Directive Other (Comment)

## 2013-08-18 NOTE — Progress Notes (Signed)
CSW (Clinical Child psychotherapist) saw PT recommendation for SNF. CSW to speak with pt again later in the day and make aware of recommendations again and inform of safety issues. CSW to see if pt is more agreeable to SNF after having further discussion.   Nelva Hauk, LCSWA [phone]

## 2013-08-18 NOTE — H&P (Signed)
I have seen and examined this patient. I have discussed with Dr Casper Harrison.  I agree with their findings and plans as documented in their admission note.  Acute Issues 1. Palpitations and DOE - Pt had hospitalization in 2013 for palpitations. No acute issues found on workup.  Started on metoprolol - Pt reports progressive increase in palpiations recently with progressive dyspnea on exertion. Prior Echo (01/13) shoed Grade 2 diastolic heart failure and EF 82-95%.  Prior ProBNP 746 (05/02/12).  - ED EKG shows: monomorphic PVC in trigeminy pattern. - Telemetry shows NSR with frequent PACs and PVCs. - Patient with history of "bronchitis", continues to smoke.   Doubt premature contractions are prognostically meaningful for patient. Recommend  - Repeat Echocardiogram to see if there has been progression in her left ventricular dysfunction - Check ProBNP to look for evidence of progression in congestion/volume overload that might benefit from addition of diuretic - Set up a 30-day loop monitor with [facility] (pt has it placed as outpatient at [facility]) to look primarily for Atrial fibrillation with RVR and assess response to increase in metoprolol therapy - increase metoprolol therapy 50% in daily dose.

## 2013-08-18 NOTE — Progress Notes (Signed)
  Echocardiogram 2D Echocardiogram has been performed.  [name] 08/18/2013, 6:52 PM

## 2013-08-18 NOTE — Progress Notes (Signed)
Utilization review completed.  

## 2013-08-18 NOTE — Progress Notes (Addendum)
Clinical Social Work Department CLINICAL SOCIAL WORK PLACEMENT NOTE 08/18/2013  Patient:  SHERRINE, SALBERG  Account Number:  [id number] Admit date:  08/17/2013  Clinical Social Worker:  Sharol Harness, Theresia Majors  Date/time:  08/18/2013 03:00 PM  Clinical Social Work is seeking post-discharge placement for this patient at the following level of care:   SKILLED NURSING   (*CSW will update this form in Epic as items are completed)   08/18/2013  Patient/family provided with [facility] Department of Clinical Social Work's list of facilities offering this level of care within the geographic area requested by the patient (or if unable, by the patient's family).  08/18/2013  Patient/family informed of their freedom to choose among providers that offer the needed level of care, that participate in Medicare, Medicaid or managed care program needed by the patient, have an available bed and are willing to accept the patient.  08/18/2013  Patient/family informed of [facility]' ownership interest in [facility], as well as of the fact that they are under no obligation to receive care at this facility.  PASARR submitted to EDS on  PASARR number received from EDS on  Existing  FL2 transmitted to all facilities in geographic area requested by pt/family on  08/18/2013 FL2 transmitted to all facilities within larger geographic area on   Patient informed that his/her managed care company has contracts with or will negotiate with  certain facilities, including the following:     Patient/family informed of bed offers received:  08/18/2013 Patient chooses bed at [facility] Physician recommends and patient chooses bed at    Patient to be transferred to [facility]  on 08/19/2013  Patient to be transferred to facility by [facility]  The following physician request were entered in Epic:   Additional Comments:   Trevante Tennell, LCSWA [phone]

## 2013-08-18 NOTE — Progress Notes (Signed)
Family Medicine Teaching Service Daily Progress Note Intern Pager: [phone]  Patient name: [name] Medical record number: [id number] Date of birth: 02-Jun-1920 Age: 79 y.o. Gender: female  Primary Care Provider: Rodman Pickle, MD Consultants: Cardiology Ulyess Mort) Code Status: Full  Pt Overview and Major Events to Date:  8/27 - pending ECHO / SW consult, PT recommended SNF / Consult Cards for Loop outpt f/u  Assessment and Plan:  [name] is a 79 y.o. year old female presenting with palpitations and chest discomfort for the past day or two. PMH significant for CVA, reported MI "three times" in the past, type II diabetes, HTN, HLD, chronic diastolic heart failure, osteoarthritis, and current cigarette use.  # Palpitations / irregular heartbeat - no definite rhythm, but difficult to tell if beats are PAC's/PVC's vs a fib  - troponin negative x1 in the ED, EKG suggestive of trigeminy  - no definite history of a fib, on metoprolol 50 mg BID at home  - troponin negative x2 [ ]  f/u repeat EKG [ ]  consulted Cardiology Ulyess Mort) - plan to see patient today, set up outpatient follow-up for Loop monitor - increase Metoprolol 50mg  BID to 50% dose  # Chronic diastolic heart failure - EF 55-60% in January 2013, grade 2 diastolic dysfunction  - no current evidence for fluid overload clinically or on CXR  - continue metoprolol, ASA  - will be judicious with fluid resuscitation; 50 mL/h only for now to support PO intake  [ ]  f/u ECHO - pending  # HTN - not on meds other than metoprolol at home; pt takes some herbal medications, vinegar, at home  - will monitor BP here, though for age, pt is likely at an appropriate range (generally ~150's/80's)  # HLD - on Zocor 20 mg at home, continue  # DM type II - per recent outpt note, last A1c 6.4 in May 2014, without plan to recheck A1c or treat with meds  - defer recheck A1c for now; glucose 160 in the ED  - given age and recent  decision by PCP and pt to defer further checks/treatment, will likely not start treatment, now  # Insomnia - long-standing; on Remeron qHS, continue  # Chronic arthritis - Tylenol PRN; holding home Mobic given palpitations and investigation of heart, as above  # Chronic urinary incontinence - per med rec pt on both oxybutynin and darifenacin at home; clarified today, patient only on Oxybutynin, as unable to obtain Darifenacin. - plan to discharge on Oxybutynin only  # Current tobacco abuse - not interested in quitting, currently; has tried patches in the past but did not like them  - nicotine gum PRN  FEN/GI: heart healthy diet, NS at 50 mL/h  Prophylaxis: subQ heparin   Disposition: PT recommends SNF, SW to discuss today with pt and determine plan for discharge  Subjective: Patient in bed. Appears comfortable, she says that overall she feels better. She is no longer complaining of palpitations and shortness of breath. Good appetite, needs to get up and ambulate told patient that PT would be by to see her today.  Objective: Temp:  [98 F (36.7 C)-98.9 F (37.2 C)] 98 F (36.7 C) (08/27 0439) Pulse Rate:  [64-100] 80 (08/27 0439) Resp:  [12-18] 18 (08/27 0439) BP: (137-183)/(78-106) 147/86 mmHg (08/27 0439) SpO2:  [95 %-100 %] 95 % (08/27 0439) Weight:  [106 lb (48.081 kg)-107 lb (48.535 kg)] 106 lb (48.081 kg) (08/27 0439) Physical Exam: General: pleasant, NAD Cardiovascular: normal rate,  with irregularity of rhythm, no murmurs heard  Respiratory: CTAB Abdomen: soft, NTND, +BS Extremities: +2 pulses, moves all ext, no edema appreciated on exam  Laboratory:  Recent Labs Lab 08/17/13 1629 08/18/13 0519  WBC 8.6 7.6  HGB 12.6 11.6*  HCT 35.9* 34.6*  PLT 303 278    Recent Labs Lab 08/17/13 1629 08/18/13 0519  NA 143 142  K 3.9 4.3  CL 106 109  CO2 27 26  BUN 18 16  CREATININE 0.81 0.84  CALCIUM 9.6 8.8  GLUCOSE 104* 92   Serum Troponin - negative x  2  Imaging/Diagnostic Tests:  EKG 8/26 - Sinus Tach, ventricular trigeminy (hx prior EKGs with bigeminy)  CXR 8/26 @1759 : cardiac enlargement but no acute abnormality  8/27 2D ECHO - pending  Meredith Pilar, DO 08/18/2013, 12:57 PM PGY-1, [facility] FPTS Intern pager: [phone], text pages welcome

## 2013-08-19 ENCOUNTER — Ambulatory Visit: Payer: Medicare Other | Admitting: Family Medicine

## 2013-08-19 ENCOUNTER — Encounter: Payer: Self-pay | Admitting: Cardiovascular Disease

## 2013-08-19 ENCOUNTER — Telehealth: Payer: Self-pay | Admitting: Cardiovascular Disease

## 2013-08-19 ENCOUNTER — Encounter (INDEPENDENT_AMBULATORY_CARE_PROVIDER_SITE_OTHER): Payer: Medicare Other

## 2013-08-19 ENCOUNTER — Other Ambulatory Visit: Payer: Self-pay

## 2013-08-19 DIAGNOSIS — R269 Unspecified abnormalities of gait and mobility: Secondary | ICD-10-CM

## 2013-08-19 DIAGNOSIS — I493 Ventricular premature depolarization: Secondary | ICD-10-CM

## 2013-08-19 DIAGNOSIS — R55 Syncope and collapse: Secondary | ICD-10-CM

## 2013-08-19 DIAGNOSIS — I5032 Chronic diastolic (congestive) heart failure: Secondary | ICD-10-CM

## 2013-08-19 DIAGNOSIS — F17200 Nicotine dependence, unspecified, uncomplicated: Secondary | ICD-10-CM

## 2013-08-19 DIAGNOSIS — R002 Palpitations: Secondary | ICD-10-CM

## 2013-08-19 DIAGNOSIS — I6789 Other cerebrovascular disease: Secondary | ICD-10-CM

## 2013-08-19 DIAGNOSIS — I4949 Other premature depolarization: Secondary | ICD-10-CM

## 2013-08-19 DIAGNOSIS — R69 Illness, unspecified: Secondary | ICD-10-CM

## 2013-08-19 DIAGNOSIS — E119 Type 2 diabetes mellitus without complications: Secondary | ICD-10-CM

## 2013-08-19 DIAGNOSIS — I2510 Atherosclerotic heart disease of native coronary artery without angina pectoris: Secondary | ICD-10-CM

## 2013-08-19 DIAGNOSIS — I10 Essential (primary) hypertension: Secondary | ICD-10-CM

## 2013-08-19 DIAGNOSIS — R413 Other amnesia: Secondary | ICD-10-CM

## 2013-08-19 DIAGNOSIS — I509 Heart failure, unspecified: Secondary | ICD-10-CM

## 2013-08-19 DIAGNOSIS — M12819 Other specific arthropathies, not elsewhere classified, unspecified shoulder: Secondary | ICD-10-CM

## 2013-08-19 DIAGNOSIS — R0789 Other chest pain: Secondary | ICD-10-CM

## 2013-08-19 LAB — GLUCOSE, CAPILLARY: Glucose-Capillary: 91 mg/dL (ref 70–99)

## 2013-08-19 MED ORDER — NICOTINE POLACRILEX 2 MG MT GUM: 2.0000 mg | CHEWING_GUM | OROMUCOSAL | Status: DC | PRN

## 2013-08-19 MED ORDER — METOPROLOL TARTRATE 50 MG PO TABS
50.0000 mg | ORAL_TABLET | Freq: Two times a day (BID) | ORAL | Status: DC
  Administered 2013-08-19: 50 mg via ORAL
  Filled 2013-08-19 (×2): qty 1

## 2013-08-19 MED ORDER — METOPROLOL TARTRATE 50 MG PO TABS: 50.0000 mg | ORAL_TABLET | Freq: Two times a day (BID) | ORAL | Status: DC

## 2013-08-19 NOTE — Progress Notes (Signed)
Reviewed discharge instructions with patient and she stated her understanding.  Event monitor placed on patient with box, instructions and extra leads sent to facility by [facility].  Instructions given to patient concerning event monitor also.  Report called to nurse Kriste Basque at [facility] with updates of event monitor.  Patient transported via [facility].  Colman Cater

## 2013-08-19 NOTE — Progress Notes (Signed)
Event monitor is being picked up by security and is to be placed on patient by staff prior to discharge.  The event monitor is to be mailed back to the company by [facility]. Information will be sent to the facility with the patient with instructions on doing this. Results will be sent to [facility] cardiology.  Theodore Demark, PA-C 08/19/2013 1:38 PM Beeper [phone]

## 2013-08-19 NOTE — Progress Notes (Signed)
CSW (Clinical Child psychotherapist) has prepared [employer] and placed with pt shadow chart. CSW informed pt and pt friend who were in the the room as well as [employer]. CSW arranged transport for 4pm and informed pt nurse. CSW signing off.  Verlia Kaney, LCSWA [phone]

## 2013-08-19 NOTE — Discharge Summary (Signed)
Family Medicine Teaching [facility] Discharge Summary  Patient name: [name] Medical record number: [id number] Date of birth: 03/21/1920 Age: 79 y.o. Gender: female Date of Admission: 08/17/2013  Date of Discharge: 08/19/2013 Admitting Physician: Leighton Roach McDiarmid, MD  Primary Care Provider: Rodman Pickle, MD Consultants: none  Indication for Hospitalization: Palpitations  Discharge Diagnoses/Problem List:  Palpitations, secondary to multiple PACs / PVCs with undefined arrhythmia - stable Chest discomfort - resolved Urinary Incontinence, Chronic Diastolic CHF, Chronic Osteoarthritis HTN - stable HLD DM, well controlled w/o medical therapy - stable Insomnia Tobacco abuse Hx of CAD Hx of CVA  Disposition: Skilled Nursing Facility  Discharge Condition: Stable  Brief Hospital Course:  [name] is a 79 y.o. year old female presenting with palpitations and chest discomfort for the past day or two. PMH significant for CVA, reported MI "three times" in the past, type II diabetes, HTN, HLD, chronic diastolic heart failure, osteoarthritis, and current cigarette use.  # Palpitations / irregular heartbeat Presented with palpitations and generalized discomfort with weakness for about 2-3 days. No history of atrial fibrillation, but history concerning with positive cardiac hx and CVA, with risk factors. Initial work-up negative (troponins, labs, CXR). EKG showed sinus tachycardia with some ventricular trigeminy consistent with previous EKGs showing bigeminy and trigeminy, without acute findings. Patient remained on remote telemetry, continued home Metoprolol 50mg  BID. Telemetry showed NSR with frequent PACs/PVCs. Palpitations resolved, patient currently stable. Contacted [facility] and arranged placement of 3 week event monitor (Loop) to evaluate for presyncope, palpitations, and frequent PVCs. Will require follow-up apt with Cardiology to review results.  #  Chronic diastolic heart failure Last EF 55-60% in January 2013, grade 2 diastolic dysfunction. Presents without any current evidence of fluid overload on exam or CXR. Contniued Metoprolol and ASA. Avoided significant fluid resuscitation. Repeat ECHO shows intact systolic function with normal EF. See details of report below.  # HTN Stable, age appropriate < 150/80. Continued home Metoprolol 50mg  BID for rate control and BP management. Attempted to increase to 75mg  BID, however patient's HR did not tolerate this well and was in the 50s. Resumed home dose of Metoprolol 50mg  BID on discharge.  # HLD Continue home Zocor 20mg  on discharge.  # DM type II -  Last HgbA1c 6.4 (04/2013). Well-controlled on diet. No current medical therapy. Did not require insulin during hospitalization.  # Insomnia Long-standing; on Remeron qHS, continue on discharge.  # Chronic arthritis Given Tylenol PRN; holding home Mobic given palpitations and investigation of heart, as above. May restart Mobic on discharge.  # Chronic urinary incontinence Per med rec on admission, confusion regarding which medication she was on for urinary incontinence. However, clarified that she was only supposed to take Oxybutynin. Will resume this on discharge for urinary incontinence.  # Current tobacco abuse Not interested in quitting, currently; has tried patches in the past but did not like them. Nicotine gum PRN  Issues for Follow Up:  1. Cardiac Event Monitor (3 weeks) - To be arranged by [facility], will schedule for 3 week Loop event monitor to be placed for evaluation of presyncope, palpitations, frequent PVCs, and follow-up results in office. You will be provided with more specific information regarding this event monitor from Cardiology.  2. Continued PT/OT evaluation and services. Patient used to live at home alone prior to current hospitalization. She would likely benefit from therapy in this new setting.  3. Will  need arrangements for someone or friend of patient to go to  home and pick up personal belongings. She does not have any instate family to do this, and requested assistance getting her belongings.  4. Urinary Incontinence - Restarted home Oxybutynin (Ditropan-XL) 10mg  daily on discharge. She has been wearing pads here. Cautious regarding changes in mental status with Oxybutynin, however she has tolerated it well in the past.  Significant Procedures:  1. Placement of Cardiac Event Monitor (duration 3 weeks) - Follow-up [facility]  Significant Labs and Imaging:   Recent Labs Lab 08/17/13 1629 08/18/13 0519  WBC 8.6 7.6  HGB 12.6 11.6*  HCT 35.9* 34.6*  PLT 303 278    Recent Labs Lab 08/17/13 1629 08/18/13 0519  NA 143 142  K 3.9 4.3  CL 106 109  CO2 27 26  GLUCOSE 104* 92  BUN 18 16  CREATININE 0.81 0.84  CALCIUM 9.6 8.8  MG 2.4  --    EKG 8/26 - Sinus Tach, ventricular trigeminy (hx prior EKGs with bigeminy)  8/26 CXR @1759 : cardiac enlargement but no acute abnormality  8/27 2D ECHO  Left ventricle: The cavity size was normal. Wall thickness was increased in a pattern of moderate LVH. Systolic function was normal. The estimated ejection fraction was in the range of 55% to 60%. Wall motion was normal; there were no regional wall motion abnormalities. - Mitral valve: Calcified annulus. Mildly thickened leaflets . Mild regurgitation. - Left atrium: The atrium was mildly dilated. - Pulmonary arteries: PA peak pressure: 33mm Hg (S). - Pericardium, extracardiac: A trivial pericardial effusion was identified.   Results/Tests Pending at Time of Discharge: none  Discharge Medications:    Medication List    STOP taking these medications       solifenacin 10 MG tablet  Commonly known as:  VESICARE      TAKE these medications       acetaminophen 650 MG CR tablet  Commonly known as:  TYLENOL ARTHRITIS PAIN  Take 1 tablet (650 mg total) by mouth every 8  (eight) hours. For arthritis pain.     aspirin 81 MG chewable tablet  Chew 81 mg by mouth daily.     B-12 100 MCG Tabs  Take 100 mcg by mouth daily.     Black Cohosh 160 MG Caps  Take 160 mg by mouth daily.     CALCIUM PO  Take 1 tablet by mouth 2 (two) times daily.     lisinopril 5 MG tablet  Commonly known as:  PRINIVIL,ZESTRIL  Take 1 tablet (5 mg total) by mouth daily.     meloxicam 7.5 MG tablet  Commonly known as:  MOBIC  Take 1 tablet (7.5 mg total) by mouth daily.     metoprolol 50 MG tablet  Commonly known as:  LOPRESSOR  Take 1 tablet (50 mg total) by mouth 2 (two) times daily.     mirtazapine 15 MG disintegrating tablet  Commonly known as:  REMERON SOL-TAB  Take 0.5 tablets (7.5 mg total) by mouth at bedtime.     nicotine polacrilex 2 MG gum  Commonly known as:  NICORETTE  Take 1 each (2 mg total) by mouth as needed for smoking cessation.     oxybutynin 10 MG 24 hr tablet  Commonly known as:  DITROPAN-XL  Take 1 tablet (10 mg total) by mouth daily.     simvastatin 20 MG tablet  Commonly known as:  ZOCOR  Take 20 mg by mouth daily.        Discharge Instructions: Please refer to  Patient Instructions section of EMR for full details.  Patient was counseled important signs and symptoms that should prompt return to medical care, changes in medications, dietary instructions, activity restrictions, and follow up appointments.   Follow-Up Appointments:   Saralyn Pilar, DO 08/19/2013, 2:13 PM PGY-1, [facility]

## 2013-08-19 NOTE — Progress Notes (Signed)
Family Medicine Teaching Service Daily Progress Note Intern Pager: [phone]  Patient name: [name] Medical record number: [id number] Date of birth: Oct 10, 1920 Age: 79 y.o. Gender: female  Primary Care Provider: Rodman Pickle, MD Consultants: Cardiology Ulyess Mort) Code Status: Full  Pt Overview and Major Events to Date:  8/27 - pending ECHO / SW consult, PT recommended SNF / Consult Cards for Loop outpt f/u 8/28 - SW SNF placement [facility]) / Cards scheduled Loop Monitor outpt  Assessment and Plan:  [name] is a 79 y.o. year old female presenting with palpitations and chest discomfort for the past day or two. PMH significant for CVA, reported MI "three times" in the past, type II diabetes, HTN, HLD, chronic diastolic heart failure, osteoarthritis, and current cigarette use.  # Palpitations / irregular heartbeat - no definite rhythm, but difficult to tell if beats are PAC's/PVC's vs a fib  - troponin negative x1 in the ED, EKG suggestive of trigeminy  - no definite history of a fib, on metoprolol 50 mg BID at home  - troponin negative x2 [ ]  f/u repeat EKG - per telemetry - NSR with PACs/PVCs - increased Metoprolol to 75mg  BID - HR 59 - Cardiology ([facility]) - arranged outpt 3 week event monitor (Loop), to eval for presyncope, palpitations, and frequent PVCs, will schedule follow-up for pt. (8/28) denies any more palpitations or irregular heartbeat, persistent generalized weakness (chronic), reports hx wore heart monitor about 3 years ago, unsure of results  # Chronic diastolic heart failure - EF 55-60% in January 2013, grade 2 diastolic dysfunction  - no current evidence for fluid overload clinically or on CXR  - continue metoprolol, ASA  - will be judicious with fluid resuscitation; 50 mL/h only for now to support PO intake  - ECHO - see report. Normal EF  # HTN - not on meds other than metoprolol at home; pt takes some herbal medications,  vinegar, at home  - will monitor BP here, though for age, pt is likely at an appropriate range (generally ~150's/80's) - today 46/76 (age appropriate), no change  # HLD - on Zocor 20 mg at home, continue  # DM type II - per recent outpt note, last A1c 6.4 in May 2014, without plan to recheck A1c or treat with meds  - defer recheck A1c for now; glucose 160 in the ED  - given age and recent decision by PCP and pt to defer further checks/treatment, will likely not start treatment  # Insomnia - long-standing; on Remeron qHS, continue  # Chronic arthritis - Tylenol PRN; holding home Mobic given palpitations and investigation of heart, as above  # Chronic urinary incontinence - per med rec pt on both oxybutynin and darifenacin at home; clarified today, patient only on Oxybutynin, as unable to obtain Darifenacin. - plan to discharge on Oxybutynin only  # Current tobacco abuse - not interested in quitting, currently; has tried patches in the past but did not like them  - nicotine gum PRN  FEN/GI: heart healthy diet, NS at 50 mL/h  Prophylaxis: subQ heparin   Disposition: PT recommends SNF, SW to discuss today with pt and determine plan for discharge  Subjective: Patient sitting up in bed. Comfortable and conversational. Overall she still complains of generalized weakness, reports this is chronic x 10 years, and has been dealing with difficulty of not being able to do "what she used to do", coping with no longer being able to live at home alone,  but knows she needs assistance for past 2 years, also no family near by. Plans to go to SNF.  Objective: Temp:  [98.3 F (36.8 C)-98.5 F (36.9 C)] 98.4 F (36.9 C) (08/28 0451) Pulse Rate:  [59-82] 59 (08/28 0451) Resp:  [16-18] 18 (08/28 0451) BP: (118-150)/(71-78) 150/76 mmHg (08/28 0451) SpO2:  [96 %-100 %] 97 % (08/28 0451) Weight:  [105 lb 1.6 oz (47.673 kg)] 105 lb 1.6 oz (47.673 kg) (08/28 0146) Physical Exam: General: pleasant,  conversational, NAD Cardiovascular: bradycardic, questionable irregularity of rhythm, L-sternal boarder faint 1-2/6 systolic murmur Respiratory: CTAB Abdomen: soft, NTND, +BS Extremities: +2 pulses, moves all ext, no edema appreciated on exam Psych: alert, oriented, good insight into condition, accurate historical information / timing  Laboratory:  Recent Labs Lab 08/17/13 1629 08/18/13 0519  WBC 8.6 7.6  HGB 12.6 11.6*  HCT 35.9* 34.6*  PLT 303 278    Recent Labs Lab 08/17/13 1629 08/18/13 0519  NA 143 142  K 3.9 4.3  CL 106 109  CO2 27 26  BUN 18 16  CREATININE 0.81 0.84  CALCIUM 9.6 8.8  GLUCOSE 104* 92   Serum Troponin - negative x 2  Imaging/Diagnostic Tests:  EKG 8/26 - Sinus Tach, ventricular trigeminy (hx prior EKGs with bigeminy)  8/26 CXR @1759 : cardiac enlargement but no acute abnormality  8/27 2D ECHO Left ventricle: The cavity size was normal. Wall thickness was increased in a pattern of moderate LVH. Systolic function was normal. The estimated ejection fraction was in the range of 55% to 60%. Wall motion was normal; there were no regional wall motion abnormalities. - Mitral valve: Calcified annulus. Mildly thickened leaflets . Mild regurgitation. - Left atrium: The atrium was mildly dilated. - Pulmonary arteries: PA peak pressure: 33mm Hg (S). - Pericardium, extracardiac: A trivial pericardial effusion was identified.  Meredith Pilar, DO 08/19/2013, 9:45 AM PGY-1, [facility] FPTS Intern pager: [phone], text pages welcome

## 2013-08-19 NOTE — Telephone Encounter (Signed)
Spoke with Bjorn Loser regarding 3 week Event monitor for Ms. Toran. She will be discharge today to [facility].  We have arranged  for  Brad with [facility] [employer] to pick up monitor and deliver it  to Par[place]. RN on [address] at [employer].  She will place the monitor on the patient.    The monitor will  be mailed back on 09-10-13 per Vance Gather.   I have informed Bjorn Loser that Nida Boatman has picked up the monitor. I also  called Suzan Slick with [facility] to let her know about the monitor as well.  She was not in so, I left her a voice message to call me.   Information for:  [facility]  [address]          [place], [place]    [phone].

## 2013-08-20 NOTE — Progress Notes (Signed)
Late entry for missed G-code. Based on review of PT evaluation by Charlotte Crumb, PT.  2013/09/04 1101  PT G-Codes **NOT FOR INPATIENT CLASS**  Functional Assessment Tool Used clinical judgment based on chart review  Functional Limitation Mobility: Walking and moving around  Mobility: Walking and Moving Around Current Status (B1478) CJ  Mobility: Walking and Moving Around Goal Status (G9562) CI

## 2013-08-21 NOTE — Progress Notes (Signed)
FMTS Attending Daily Note:  Renold Don MD  [phone] pager  Family Practice pager:  [phone] I have seen and examined this patient and have reviewed their chart. I have discussed this patient with the resident. I agree with the resident's findings, assessment and care plan.   Tobey Grim, MD 08/21/2013 12:48 PM

## 2013-08-21 NOTE — Discharge Summary (Signed)
Family Medicine Teaching Service  Discharge Note : Attending Jeff Janyia Guion MD Pager [phone] Inpatient Team Pager:  [phone]  I have reviewed this patient and the patient's chart and have discussed discharge planning with the resident at the time of discharge. I agree with the discharge plan as above.    

## 2013-09-11 ENCOUNTER — Emergency Department (HOSPITAL_COMMUNITY): Payer: Medicare Other

## 2013-09-11 ENCOUNTER — Inpatient Hospital Stay (HOSPITAL_COMMUNITY): Payer: Medicare Other

## 2013-09-11 ENCOUNTER — Observation Stay (HOSPITAL_COMMUNITY)
Admission: EM | Admit: 2013-09-11 | Discharge: 2013-09-14 | Disposition: A | Discharge status: Skilled Nursing Facility | Payer: Medicare Other | Attending: Family Medicine | Admitting: Family Medicine

## 2013-09-11 ENCOUNTER — Encounter (HOSPITAL_COMMUNITY): Payer: Self-pay | Admitting: *Deleted

## 2013-09-11 DIAGNOSIS — M129 Arthropathy, unspecified: Secondary | ICD-10-CM | POA: Insufficient documentation

## 2013-09-11 DIAGNOSIS — I491 Atrial premature depolarization: Secondary | ICD-10-CM | POA: Insufficient documentation

## 2013-09-11 DIAGNOSIS — R32 Unspecified urinary incontinence: Secondary | ICD-10-CM | POA: Insufficient documentation

## 2013-09-11 DIAGNOSIS — N3941 Urge incontinence: Secondary | ICD-10-CM

## 2013-09-11 DIAGNOSIS — E119 Type 2 diabetes mellitus without complications: Secondary | ICD-10-CM | POA: Insufficient documentation

## 2013-09-11 DIAGNOSIS — F17200 Nicotine dependence, unspecified, uncomplicated: Secondary | ICD-10-CM | POA: Insufficient documentation

## 2013-09-11 DIAGNOSIS — I5032 Chronic diastolic (congestive) heart failure: Secondary | ICD-10-CM

## 2013-09-11 DIAGNOSIS — Z7409 Other reduced mobility: Secondary | ICD-10-CM

## 2013-09-11 DIAGNOSIS — I4949 Other premature depolarization: Secondary | ICD-10-CM | POA: Insufficient documentation

## 2013-09-11 DIAGNOSIS — R269 Unspecified abnormalities of gait and mobility: Secondary | ICD-10-CM

## 2013-09-11 DIAGNOSIS — G4700 Insomnia, unspecified: Secondary | ICD-10-CM | POA: Insufficient documentation

## 2013-09-11 DIAGNOSIS — I10 Essential (primary) hypertension: Secondary | ICD-10-CM

## 2013-09-11 DIAGNOSIS — I509 Heart failure, unspecified: Secondary | ICD-10-CM

## 2013-09-11 DIAGNOSIS — R413 Other amnesia: Secondary | ICD-10-CM

## 2013-09-11 DIAGNOSIS — I6789 Other cerebrovascular disease: Secondary | ICD-10-CM

## 2013-09-11 DIAGNOSIS — G319 Degenerative disease of nervous system, unspecified: Secondary | ICD-10-CM | POA: Insufficient documentation

## 2013-09-11 DIAGNOSIS — G3184 Mild cognitive impairment, so stated: Secondary | ICD-10-CM | POA: Insufficient documentation

## 2013-09-11 DIAGNOSIS — Z79899 Other long term (current) drug therapy: Secondary | ICD-10-CM | POA: Insufficient documentation

## 2013-09-11 DIAGNOSIS — I4891 Unspecified atrial fibrillation: Principal | ICD-10-CM | POA: Insufficient documentation

## 2013-09-11 DIAGNOSIS — R002 Palpitations: Secondary | ICD-10-CM

## 2013-09-11 DIAGNOSIS — I2510 Atherosclerotic heart disease of native coronary artery without angina pectoris: Secondary | ICD-10-CM

## 2013-09-11 HISTORY — DX: Chronic kidney disease, stage 2 (mild): N18.2

## 2013-09-11 HISTORY — DX: Cardiac murmur, unspecified: R01.1

## 2013-09-11 LAB — URINALYSIS, ROUTINE W REFLEX MICROSCOPIC
Bilirubin Urine: NEGATIVE
Hgb urine dipstick: NEGATIVE
Ketones, ur: NEGATIVE mg/dL
Leukocytes, UA: NEGATIVE
Nitrite: NEGATIVE
Specific Gravity, Urine: 1.013 (ref 1.005–1.030)
Urobilinogen, UA: 0.2 mg/dL (ref 0.0–1.0)

## 2013-09-11 LAB — POCT I-STAT TROPONIN I: Troponin i, poc: 0 ng/mL (ref 0.00–0.08)

## 2013-09-11 LAB — CBC
HCT: 33.8 % — ABNORMAL LOW (ref 36.0–46.0)
MCH: 29.4 pg (ref 26.0–34.0)
MCHC: 34.6 g/dL (ref 30.0–36.0)
MCHC: 34.1 g/dL (ref 30.0–36.0)
MCV: 86 fL (ref 78.0–100.0)
MCV: 86.1 fL (ref 78.0–100.0)
Platelets: 296 10*3/uL (ref 150–400)
Platelets: 300 10*3/uL (ref 150–400)
RDW: 13.8 % (ref 11.5–15.5)
RDW: 13.9 % (ref 11.5–15.5)
WBC: 9.7 10*3/uL (ref 4.0–10.5)

## 2013-09-11 LAB — BASIC METABOLIC PANEL
BUN: 24 mg/dL — ABNORMAL HIGH (ref 6–23)
CO2: 24 mEq/L (ref 19–32)
Calcium: 9.6 mg/dL (ref 8.4–10.5)
Creatinine, Ser: 0.82 mg/dL (ref 0.50–1.10)
Glucose, Bld: 108 mg/dL — ABNORMAL HIGH (ref 70–99)
Sodium: 138 mEq/L (ref 135–145)

## 2013-09-11 LAB — POCT I-STAT, CHEM 8
Calcium, Ion: 1.22 mmol/L (ref 1.13–1.30)
Glucose, Bld: 146 mg/dL — ABNORMAL HIGH (ref 70–99)
HCT: 36 % (ref 36.0–46.0)
Hemoglobin: 12.2 g/dL (ref 12.0–15.0)
Potassium: 4.5 mEq/L (ref 3.5–5.1)

## 2013-09-11 LAB — TROPONIN I: Troponin I: <0.3 ng/mL (ref <0.30)

## 2013-09-11 MED ORDER — SODIUM CHLORIDE 0.9 % IJ SOLN
3.0000 mL | Freq: Two times a day (BID) | INTRAMUSCULAR | Status: DC
  Administered 2013-09-11 – 2013-09-14 (×6): 3 mL via INTRAVENOUS

## 2013-09-11 MED ORDER — ACETAMINOPHEN 325 MG PO TABS
650.0000 mg | ORAL_TABLET | ORAL | Status: DC | PRN
  Administered 2013-09-12 – 2013-09-13 (×2): 650 mg via ORAL
  Filled 2013-09-11 (×2): qty 2

## 2013-09-11 MED ORDER — SIMVASTATIN 20 MG PO TABS
20.0000 mg | ORAL_TABLET | Freq: Every day | ORAL | Status: DC
  Administered 2013-09-11 – 2013-09-13 (×3): 20 mg via ORAL
  Filled 2013-09-11 (×4): qty 1

## 2013-09-11 MED ORDER — SODIUM CHLORIDE 0.9 % IJ SOLN: 3.0000 mL | INTRAMUSCULAR | Status: DC | PRN

## 2013-09-11 MED ORDER — MIRTAZAPINE 15 MG PO TBDP
7.5000 mg | ORAL_TABLET | Freq: Every day | ORAL | Status: DC
  Administered 2013-09-11 – 2013-09-13 (×3): 7.5 mg via ORAL
  Filled 2013-09-11 (×6): qty 0.5

## 2013-09-11 MED ORDER — SODIUM CHLORIDE 0.9 % IV SOLN: 250.0000 mL | INTRAVENOUS | Status: DC | PRN

## 2013-09-11 MED ORDER — ENOXAPARIN SODIUM 60 MG/0.6ML ~~LOC~~ SOLN
45.0000 mg | SUBCUTANEOUS | Status: DC
  Administered 2013-09-12 – 2013-09-14 (×3): 45 mg via SUBCUTANEOUS
  Filled 2013-09-11 (×5): qty 0.6

## 2013-09-11 MED ORDER — INFLUENZA VAC SPLIT QUAD 0.5 ML IM SUSP
0.5000 mL | INTRAMUSCULAR | Status: AC
  Filled 2013-09-11: qty 0.5

## 2013-09-11 MED ORDER — PNEUMOCOCCAL VAC POLYVALENT 25 MCG/0.5ML IJ INJ
0.5000 mL | INJECTION | INTRAMUSCULAR | Status: AC
  Filled 2013-09-11: qty 0.5

## 2013-09-11 MED ORDER — METOPROLOL TARTRATE 50 MG PO TABS
75.0000 mg | ORAL_TABLET | Freq: Two times a day (BID) | ORAL | Status: DC
  Administered 2013-09-11 – 2013-09-13 (×5): 75 mg via ORAL
  Filled 2013-09-11 (×7): qty 1

## 2013-09-11 MED ORDER — ASPIRIN 81 MG PO CHEW
81.0000 mg | CHEWABLE_TABLET | Freq: Every day | ORAL | Status: DC
  Administered 2013-09-11 – 2013-09-14 (×4): 81 mg via ORAL
  Filled 2013-09-11 (×3): qty 1

## 2013-09-11 MED ORDER — METOPROLOL TARTRATE 50 MG PO TABS
50.0000 mg | ORAL_TABLET | Freq: Two times a day (BID) | ORAL | Status: DC
  Administered 2013-09-11: 50 mg via ORAL
  Filled 2013-09-11 (×2): qty 1

## 2013-09-11 MED ORDER — LISINOPRIL 5 MG PO TABS
5.0000 mg | ORAL_TABLET | Freq: Every day | ORAL | Status: DC
Start: 2013-09-11 — End: 2013-09-14
  Administered 2013-09-11 – 2013-09-14 (×4): 5 mg via ORAL
  Filled 2013-09-11 (×4): qty 1

## 2013-09-11 MED ORDER — ONDANSETRON HCL 4 MG/2ML IJ SOLN: 4.0000 mg | Freq: Four times a day (QID) | INTRAMUSCULAR | Status: DC | PRN

## 2013-09-11 NOTE — ED Notes (Signed)
Pt is doing fine at this time.  Pt is very talkative and just wants a cigarette

## 2013-09-11 NOTE — ED Notes (Addendum)
Received report from off going RN and introduced self to the pt.  Pt is stable without pain at this time will continue to monitor

## 2013-09-11 NOTE — Consult Note (Signed)
CARDIOLOGY CONSULT NOTE  Patient ID: [name] MRN: [id number], DOB/AGE: [birth year]   Admit date: 09/11/2013 Date of Consult: 09/11/2013  Primary Physician: Rodman Pickle, MD Primary Cardiologist: Judie Petit. Excell Seltzer, MD   Pt. Profile  79 year old female with history of palpitations was admitted last night with reported atrial fibrillation and rapid ventricular response.  Problem List  Past Medical History  Diagnosis Date  . CVA (cerebral vascular accident)     a. She's not sure that this is true.  [name] CAD (coronary artery disease)     a. reports prior h/o MI's with possible cath @ [facility] in [place] 'many years ago.'  . Vitamin D deficiency   . Diabetes mellitus, type 2   . HTN (hypertension)   . HLD (hyperlipidemia)   . Tremor, essential   . Urinary incontinence   . High risk social situations 11/28/2011  . Intention tremor 03/03/2007    Qualifier: Diagnosis of  By: Dorathy Daft  MD, IDAYLIS    . Syncope, near 02/28/2012  . Altered mental status 01/01/2011    Qualifier: Diagnosis of  By: Earnest Bailey MD, Selena Batten    . DEFICIENCY, VITAMIN D NOS 07/16/2007    Qualifier: Diagnosis of  By: Irving Burton MD, Clifton Custard    . Asthma   . History of pneumonia   . Recurrent upper respiratory infection (URI)   . Hyperthyroidism   . Anemia   . CKD (chronic kidney disease), stage II   . H/O hiatal hernia   . Seizures   . Headache(784.0)   . Neuromuscular disorder   . Arthritis   . Fibromyalgia   . CAROTID ARTERY DISEASE 07/16/2007    a. 07/2012 U/S: no signif extracranial carotid artery stenosis, antegrad vertebral flow.  . Diastolic CHF, chronic     a. 07/2013 Echo: EF 55-60%.  . TOBACCO USER   . Palpitations     a. 8.2014 Echo: EF 55-60%, no rwma, mod LVH, mildly dil LA, PASP .  [name] Systolic murmur     a. no significant valvular abnormalities on echo 8.2014.    Past Surgical History  Procedure Laterality Date  . Appendectomy  1965  . Vaginal hysterectomy  1965  . Knee surgery  1989, 1995       Allergies  Allergies  Allergen Reactions  . Codeine     Unsure of reaction  . Penicillins     Unsure of reaction    HPI   79 year old female with the above complex problem list. She is unsure about much of her history but believes that she has had heart attacks in the past and may have also had cardiac catheterization in [place]. She has a reported history of stroke though she is not sure that is accurate. She was hospitalized in August of this year with complaints of palpitations and was noted to have frequent PACs and PVCs. An echocardiogram was performed and showed normal LV function. She was managed with beta blocker therapy and at discharge a 21 day event monitor was placed with a plan for her to followup in our office. She did well following discharge, initially going to [facility] for rehabilitation and subsequently being discharged from there to home on the 18th. Yesterday, she began to experience tachypalpitations associated with mild dyspnea and lightheadedness. A neighbor came over to visit her and when she reported her symptoms, EMS was called. Per ER notes, upon EMS arrival, her heart rate was in the 200s. Per ER physician note, EMS strip showed A.  fib with a rate of 186 and a second with a rate of 192. ECGs in the emergency department showed sinus rhythm. Patient reports that she was wearing her event monitor during the episode however I have contacted [facility] and they report that they have not had any  patient activations or auto trigger alarms since the 16th. They have no documentation of atrial fibrillation.   Since admission, patient says that she has continued to have intermittent palpitations however has been in sinus rhythm with frequent PACs and occasional PVCs. She's not had any atrial fibrillation since admission. She currently denies chest pain, PND, orthopnea, dizziness, syncope, or edema.  Inpatient Medications  . aspirin  81 mg Oral Daily  .  enoxaparin (LOVENOX) injection  45 mg Subcutaneous Q24H  . [START ON 09/12/2013] influenza vac split quadrivalent PF  0.5 mL Intramuscular Tomorrow-1000  . lisinopril  5 mg Oral Daily  . metoprolol  50 mg Oral BID  . mirtazapine  7.5 mg Oral QHS  . [START ON 09/12/2013] pneumococcal 23 valent vaccine  0.5 mL Intramuscular Tomorrow-1000  . simvastatin  20 mg Oral q1800  . sodium chloride  3 mL Intravenous Q12H   Family History Family History  Problem Relation Age of Onset  . Diabetes Mother   . Diabetes Brother   . Stroke Father     Social History History   Social History  . Marital Status: Widowed    Spouse Name: N/A    Number of Children: N/A  . Years of Education: N/A   Occupational History  . Retired     from [place] [employer]   Social History Main Topics  . Smoking status: Current Every Day Smoker -- 0.50 packs/day    Types: Cigarettes  . Smokeless tobacco: Never Used     Comment: does not smoke much...  . Alcohol Use: No  . Drug Use: No  . Sexual Activity: No   Other Topics Concern  . Not on file   Social History Narrative   Lives at home alone. Widowed.  Husband was in the [employer].  Able to cook, bathe and cleans by herself. Walks with cane and crutch. Has a wheelchair but not able to use it.   Does not have anyone she can really trust. Has a friend that works at the [employer] that stops by to help some.   Lives off social security, retired from [employer] in 1988.   After mortgage, has $400 left.      Does not have a POA if anything should happen to her. Has 2 nieces, 2 nephews that live far away. (Only one nephew "knows that I am alive")      She does want CPR attempted but does not wish to have too much done. Organ donor/donating body to science.       Diet consists mostly of greens and veggies. Little meat, must force herself to eat meat.      Smokes 2-4 cigs per day.     Review of Systems  General:  No chills, fever, night sweats  or weight changes.  Cardiovascular:  +++ tachypalpitations assoc with wkns, dysnea and lightheadedness.  No chest pain, dyspnea on exertion, edema, orthopnea, paroxysmal nocturnal dyspnea. Dermatological: No rash, lesions/masses Respiratory: No cough, dyspnea Urologic: No hematuria, dysuria Abdominal:   No nausea, vomiting, diarrhea, bright red blood per rectum, melena, or hematemesis Neurologic:  No visual changes, wkns, changes in mental status. All other systems reviewed and are otherwise negative  except as noted above.  Physical Exam  Blood pressure 130/63, pulse 65, temperature 98.6 F (37 C), temperature source Oral, resp. rate 18, height 5\' 3"  (1.6 m), weight 104 lb 0.9 oz (47.2 kg), SpO2 97.00%.  General: Pleasant, NAD Psych: Normal affect. Neuro: Alert and oriented X 3. Moves all extremities spontaneously. HEENT: Normal  Neck: Supple without bruits or JVD. Lungs:  Resp regular and unlabored, few basilar crackles that clear quickly, otw CTA. Heart: RRR no s3, s4.  2/6 systolic murmur throughout, heard best @ llsb->apex. Abdomen: Soft, non-tender, non-distended, BS + x 4.  Extremities: No clubbing, cyanosis or edema. DP/PT/Radials 2+ and equal bilaterally.  Labs   Recent Labs  09/11/13 1200  TROPONINI <0.30   Lab Results  Component Value Date   WBC 9.7 09/11/2013   HGB 11.8* 09/11/2013   HCT 34.6* 09/11/2013   MCV 86.1 09/11/2013   PLT 300 09/11/2013     Recent Labs Lab 09/11/13 0530  NA 138  K 4.5  CL 105  CO2 24  BUN 24*  CREATININE 0.82  CALCIUM 9.6  GLUCOSE 108*   Radiology/Studies  Ct Head Wo Contrast  09/11/2013   *RADIOLOGY REPORT*  Clinical Data: Altered mental status  CT HEAD WITHOUT CONTRAST   IMPRESSION: Extensive age related atrophy with severe leukoariosis.  No acute intracranial process.   Original Report Authenticated By: Rise Mu, M.D.   Dg Chest Portable 1 View  09/11/2013   *RADIOLOGY REPORT*  Clinical Data: Atrial fibrillation   PORTABLE CHEST - 1 VIEW   IMPRESSION: Emphysema.  No acute superimposed abnormality.   Original Report Authenticated By: Britta Mccreedy, M.D.   ECG  Sinus arrhythmia, pvc's, 74, no acute st/t changes.  ASSESSMENT AND PLAN  1. Paroxysmal atrial fibrillation: Patient presented to the emergency department yesterday with tachypalpitations and per ER notes was found to be in A. fib with RVR. EMS strips are not currently available for review and unfortunately, [facility] does not have any recordings of yesterday's activities. Patient is certain that she was wearing the monitor. Since admission, she has been sinus rhythm with PACs and occasional PVCs. She says that she continues to have intermittent palpitations despite not having recurrence of atrial fibrillation.  Agree with continuation of beta blocker therapy and we will titrate to 75 mg twice a day.  Cont ASA 81mg  daily.  Though if we assume that she does have a h/o CAD and CVA, her  CHA2DS2VASc is quite high.  If she goes to SNF, she may be better served with eliquis and we will consider initiation prior to d/c.  Given inaccuracy of event recording, either device or pt related, we will ask EP to eval for possible implantable loop recorder.  Signed, Nicolasa Ducking, NP 09/11/2013, 3:09 PM  Patient examined chart reviewed. Not clear if she has had afib.  Event monitor has been challenging Should have auto triggered if she was in rapid afib  She has sinus arrhythmia PAC;s and PVC;s  For now continue aspirin and beta blocker.  Will ask EP to evaluate but I suspect they will not want to put ILR in  [facility]

## 2013-09-11 NOTE — ED Notes (Signed)
Pt discharged from [facility] yesterday at 11am. Started having increased heart rate/palpitations last evening. Called EMS for increased HR and dizziness. EMS found pt's HR in the 200's. Pt has a history of Afib, CAD, and MI. Pt denies nausea, vomiting, diaphoreses, or chest pain. Pt alert and oriented x 3, neuro intact. Pt does live alone and has a history of noncompliance with meds.

## 2013-09-11 NOTE — H&P (Signed)
FMTS Attending Note  I personally saw and evaluated the patient. The plan of care was discussed with the resident team. I agree with the assessment and plan as documented by the resident.   Patient no longer having palpitation, no chest pain, no sob  Gen: pleasant AAF, NAD, A&O X3 Cardiac: Irregularly Irregular, normal rate, no murmurs, no heaves/thrills Resp: CTAB, good effort ABD: soft, nontender, bowel sounds present in all quadrants Ext: trace LE edam, 2+ radial/DP/PT pulses  1. PAF - currently regular rate, continue ASA, appreciate cardiology input, pharmacy treating with therapeutic Lovenox, will defer further anticoagulation decisions to cardiology 2. Encephalopathy - unclear if acute vs dementia, will evaluate with MMSE 3. HFeEF- not currently volume overloaded, continue management as documented  Donnella Sham MD

## 2013-09-11 NOTE — Evaluation (Signed)
Physical Therapy Evaluation Patient Details Name: [name] MRN: [id number] DOB: 1920/05/21 Today's Date: 09/11/2013 Time: 1478-2956 PT Time Calculation (min): 18 min  PT Assessment / Plan / Recommendation History of Present Illness  [name] is a 79 y.o. female presenting with presenting with palpitations found to be in Afib. Her PMH is significant for HF w/ previous MI, CVA, Carotid artery disease,  HTN, DM, Hyperthyroidism, and nicotine use. A-fib initially with RVR in EMS and ED, tachycardia resolved spontaneously in ED. Palpitations similar to previous admission, but also now with some confusion/altered mental status of uncertain cause.  Clinical Impression  Pt admitted with the above. Pt currently with functional limitations due to the deficits listed below (see PT Problem List). Difficult to determine PLOF and home environment due to cognition. Pt will benefit from skilled PT to increase their independence and safety with mobility to allow discharge to the venue listed below.      PT Assessment  Patient needs continued PT services    Follow Up Recommendations  SNF    Equipment Recommendations  None recommended by PT    Frequency Min 3X/week    Precautions / Restrictions Precautions Precautions: Fall Restrictions Weight Bearing Restrictions: No   Pertinent Vitals/Pain No c/o pain      Mobility  Bed Mobility Bed Mobility: Supine to Sit Supine to Sit: 4: Min assist;HOB flat Details for Bed Mobility Assistance: (A) to elevate trunk OOB with cues for hand placement Transfers Transfers: Sit to Stand;Stand to Sit Sit to Stand: 3: Mod assist;From bed Stand to Sit: 4: Min assist;To chair/3-in-1 Details for Transfer Assistance: (A) to initiate transfer and slowly descend to recliner with max cues for hand placement Ambulation/Gait Ambulation/Gait Assistance: 1: +2 Total assist Ambulation/Gait: Patient Percentage: 60% Ambulation Distance (Feet): 10  Feet Assistive device: 2 person hand held assist Ambulation/Gait Assistance Details: +2 (A) to maintain balance with cues for step sequence.   Gait Pattern: Step-through pattern;Decreased stride length;Shuffle;Trunk flexed;Narrow base of support Gait velocity: decreased Stairs: No    Exercises     PT Diagnosis: Difficulty walking;Generalized weakness  PT Problem List: Decreased strength;Decreased activity tolerance;Decreased balance;Decreased mobility;Decreased coordination;Decreased knowledge of use of DME;Decreased safety awareness;Pain;Decreased cognition PT Treatment Interventions: DME instruction;Gait training;Functional mobility training;Therapeutic activities;Therapeutic exercise;Balance training;Patient/family education     PT Goals(Current goals can be found in the care plan section) Acute Rehab PT Goals Patient Stated Goal: Did not set PT Goal Formulation: With patient Time For Goal Achievement: 09/18/13 Potential to Achieve Goals: Good  Visit Information  Last PT Received On: 09/11/13 Assistance Needed: +1 History of Present Illness: [name] is a 79 y.o. female presenting with presenting with palpitations found to be in Afib. Her PMH is significant for HF w/ previous MI, CVA, Carotid artery disease,  HTN, DM, Hyperthyroidism, and nicotine use. A-fib initially with RVR in EMS and ED, tachycardia resolved spontaneously in ED. Palpitations similar to previous admission, but also now with some confusion/altered mental status of uncertain cause.       Prior Functioning  Home Living Family/patient expects to be discharged to:: Private residence Living Arrangements: Alone Available Help at Discharge: Other (Comment) Type of Home: House Home Access: Stairs to enter [employer] of Steps: 2 Entrance Stairs-Rails: None;Right Home Layout: One level Home Equipment: Cane - single point;Shower seat;Bedside commode Additional Comments: Pt d/c from [facility] and when return home pt began to have increase HR and per pt neighbor called "[phone]" Prior Function Level of Independence:  Independent with assistive device(s) Comments: with BADLs, gets Meals on Wheels daily Communication Communication: No difficulties Dominant Hand: Right    Cognition  Cognition Arousal/Alertness: Awake/alert Behavior During Therapy: Anxious (due to confusion about overall situation and overall events) Overall Cognitive Status: No family/caregiver present to determine baseline cognitive functioning Area of Impairment: Orientation;Attention;Following commands;Safety/judgement;Awareness;Problem solving Orientation Level: Disoriented to;Place;Time;Situation Current Attention Level: Selective Memory: Decreased short-term memory Following Commands: Follows multi-step commands with increased time Problem Solving: Difficulty sequencing;Requires verbal cues General Comments: Pt with increase anxiety when unable to recall recent events.  Pt did not no where she was and thought [employer] with actually [facility] when attempting to determine recent events.     Extremity/Trunk Assessment Lower Extremity Assessment Lower Extremity Assessment: Overall WFL for tasks assessed Cervical / Trunk Assessment Cervical / Trunk Assessment: Kyphotic   Balance    End of Session PT - End of Session Equipment Utilized During Treatment: Gait belt Activity Tolerance: Patient tolerated treatment well Patient left: in chair;with call bell/phone within reach;with nursing/sitter in room Nurse Communication: Mobility status  GP     Conroy Goracke 09/11/2013, 1:04 PM  Jake Shark, PT DPT [phone]

## 2013-09-11 NOTE — ED Provider Notes (Signed)
CSN: [id number]     Arrival date & time 09/11/13  0026 History   First MD Initiated Contact with Patient 09/11/13 0100     Chief Complaint  Patient presents with  . Atrial Fibrillation   (Consider location/radiation/quality/duration/timing/severity/associated sxs/prior Treatment) HPI Hx per PT  - recent admit for palpitations  and discharged home a few hours earlier with Holter monitor. PT lives alone. After going home developed recurrent palpitations, no CP or SOB but very anxious.  BIB EMS, not sure if she received any medications but now symptoms resolved.  No F/C. Is a smoker. No N/V/D.  PT very worried about going back home not knowing what is the cause of her symptoms.  Past Medical History  Diagnosis Date  . CVA (cerebral vascular accident)   . CAD (coronary artery disease)   . Vitamin D deficiency   . Diabetes mellitus, type 2   . HTN (hypertension)   . HLD (hyperlipidemia)   . Tremor, essential   . Urinary incontinence   . DIABETES-TYPE 2 07/16/2007  . HYPERLIPIDEMIA 03/03/2007  . TOBACCO USER 12/25/2009  . HYPERTENSION, ESSENTIAL NOS 08/07/2007  . CAROTID ARTERY DISEASE 07/16/2007  . C V A/STROKE 07/16/2007  . Palpitations 02/08/2010  . High risk social situations 11/28/2011  . Intention tremor 03/03/2007    Qualifier: Diagnosis of  By: Dorathy Daft  MD, IDAYLIS    . Syncope, near 02/28/2012  . Altered mental status 01/01/2011    Qualifier: Diagnosis of  By: Earnest Bailey MD, Selena Batten    . DEFICIENCY, VITAMIN D NOS 07/16/2007    Qualifier: Diagnosis of  By: Irving Burton MD, Clifton Custard    . Myocardial infarction   . Angina   . Heart murmur   . Asthma   . Shortness of breath   . Pneumonia   . Recurrent upper respiratory infection (URI)   . Hyperthyroidism   . Anemia   . Chronic kidney disease   . H/O hiatal hernia   . Seizures   . Headache(784.0)   . Neuromuscular disorder   . Arthritis   . Fibromyalgia   . CAROTID ARTERY DISEASE 07/16/2007  . Diastolic CHF, chronic 12/31/2011  . TOBACCO  USER 12/25/2009   Past Surgical History  Procedure Laterality Date  . Appendectomy  1965  . Vaginal hysterectomy  1965  . Knee surgery  1989, 1995   Family History  Problem Relation Age of Onset  . Diabetes Mother   . Diabetes Brother   . Stroke Father    History  Substance Use Topics  . Smoking status: Current Every Day Smoker -- 0.50 packs/day    Types: Cigarettes  . Smokeless tobacco: Never Used     Comment: does not smoke much...  . Alcohol Use: No   OB History   Grav Para Term Preterm Abortions TAB SAB Ect Mult Living                 Review of Systems  Constitutional: Negative for fever and chills.  HENT: Negative for neck pain and neck stiffness.   Eyes: Negative for pain.  Respiratory: Negative for shortness of breath.   Cardiovascular: Positive for palpitations. Negative for chest pain.  Gastrointestinal: Negative for abdominal pain.  Genitourinary: Negative for dysuria.  Musculoskeletal: Negative for back pain.  Skin: Negative for rash.  Neurological: Negative for headaches.  All other systems reviewed and are negative.    Allergies  Codeine and Penicillins  Home Medications   Current Outpatient Rx  Name  Route  Sig  Dispense  Refill  . acetaminophen (TYLENOL ARTHRITIS PAIN) 650 MG CR tablet   Oral   Take 1 tablet (650 mg total) by mouth every 8 (eight) hours. For arthritis pain.   90 tablet   5   . aspirin 81 MG chewable tablet   Oral   Chew 81 mg by mouth daily.         . Black Cohosh 160 MG CAPS   Oral   Take 160 mg by mouth daily.          [name] CALCIUM PO   Oral   Take 1 tablet by mouth 2 (two) times daily.          . Cyanocobalamin (B-12) 100 MCG TABS   Oral   Take 100 mcg by mouth daily.          [name] lisinopril (PRINIVIL,ZESTRIL) 5 MG tablet   Oral   Take 1 tablet (5 mg total) by mouth daily.   90 tablet   1   . meloxicam (MOBIC) 7.5 MG tablet   Oral   Take 1 tablet (7.5 mg total) by mouth daily.   30 tablet   0   .  metoprolol (LOPRESSOR) 50 MG tablet   Oral   Take 1 tablet (50 mg total) by mouth 2 (two) times daily.   180 tablet   1   . mirtazapine (REMERON SOL-TAB) 15 MG disintegrating tablet   Oral   Take 0.5 tablets (7.5 mg total) by mouth at bedtime.   30 tablet   11   . oxybutynin (DITROPAN-XL) 10 MG 24 hr tablet   Oral   Take 1 tablet (10 mg total) by mouth daily.   30 tablet   5   . simvastatin (ZOCOR) 20 MG tablet   Oral   Take 20 mg by mouth daily.          BP 122/77  Pulse 89  Temp(Src) 98.2 F (36.8 C) (Oral)  Resp 18  Ht 5\' 3"  (1.6 m)  Wt 107 lb (48.535 kg)  BMI 18.96 kg/m2  SpO2 100% Physical Exam  Constitutional: She is oriented to person, place, and time. She appears well-developed and well-nourished.  HENT:  Head: Normocephalic and atraumatic.  Eyes: EOM are normal. Pupils are equal, round, and reactive to light.  Neck: Neck supple.  Cardiovascular: Normal rate, regular rhythm and intact distal pulses.   Pulmonary/Chest: Effort normal and breath sounds normal. No respiratory distress. She exhibits no tenderness.  Abdominal: Soft. She exhibits no distension. There is no tenderness.  Musculoskeletal: Normal range of motion. She exhibits no tenderness.  Neurological: She is alert and oriented to person, place, and time.  Skin: Skin is warm and dry.    ED Course  Procedures (including critical care time) Labs Review Labs Reviewed  CBC - Abnormal; Notable for the following:    WBC 11.5 (*)    Hemoglobin 11.7 (*)    HCT 33.8 (*)    All other components within normal limits  BASIC METABOLIC PANEL - Abnormal; Notable for the following:    Glucose, Bld 108 (*)    BUN 24 (*)    GFR calc non Af Amer 61 (*)    GFR calc Af Amer 70 (*)    All other components within normal limits  CBC - Abnormal; Notable for the following:    Hemoglobin 11.8 (*)    HCT 34.6 (*)    All other components within normal limits  POCT  I-STAT, CHEM 8 - Abnormal; Notable for the  following:    BUN 25 (*)    Glucose, Bld 146 (*)    All other components within normal limits  URINALYSIS, ROUTINE W REFLEX MICROSCOPIC  TROPONIN I  TROPONIN I  TSH  POCT I-STAT TROPONIN I   Imaging Review Ct Head Wo Contrast  09/11/2013   *RADIOLOGY REPORT*  Clinical Data: Altered mental status  CT HEAD WITHOUT CONTRAST  Technique:  Contiguous axial images were obtained from the base of the skull through the vertex without contrast.  Comparison: None.  Findings: Extensive age related atrophy with chronic microvascular ischemic disease is present.  No acute intracranial hemorrhage or infarct.  No mass or midline shift.  No extra-axial fluid collection.  Calvarium is intact.  Orbits are within normal limits.  Paranasal sinuses and mastoid air cells are clear.  IMPRESSION: Extensive age related atrophy with severe leukoariosis.  No acute intracranial process.   Original Report Authenticated By: Rise Mu, M.D.   Dg Chest Portable 1 View  09/11/2013   *RADIOLOGY REPORT*  Clinical Data: Atrial fibrillation  PORTABLE CHEST - 1 VIEW  Comparison: 08/17/2013 and chest CT of 12/28/2011  Findings: Stable cardiomegaly.  Chronic emphysematous changes throughout both lungs.  No focal airspace disease, effusion, or edema.  Negative for pneumothorax.  Degenerative changes of both shoulders, more severe on the right than the left.  Stable convex right scoliosis of the mid to lower thoracic spine.  Diffuse osteopenia.  IMPRESSION: Emphysema.  No acute superimposed abnormality.   Original Report Authenticated By: Britta Mccreedy, M.D.     Date: 09/11/2013  Rate: 81  Rhythm: normal sinus rhythm  QRS Axis: normal  Intervals: normal  ST/T Wave abnormalities: nonspecific ST changes  Conduction Disutrbances:none  Narrative Interpretation:   Old EKG Reviewed: none available  ECG strips from EMS reviewed ?Afib rate 186/ repeat ?afib 192. Narrow complex tachycardia  2:27 AM d/w [facility] resident - will eval  bedside for admit  MDM  Dx: Arrythmia/ palpitations  ECG now NSR Labs. CXR. MED admit    Sunnie Nielsen, MD 09/13/13 [phone]

## 2013-09-11 NOTE — Progress Notes (Signed)
ANTICOAGULATION CONSULT NOTE - Initial Consult  Pharmacy Consult for lovenox Indication: atrial fibrillation  Allergies  Allergen Reactions  . Codeine     Unsure of reaction  . Penicillins     Unsure of reaction     Patient Measurements: Height: 5\' 3"  (160 cm) Weight: 104 lb 0.9 oz (47.2 kg) IBW/kg (Calculated) : 52.4   Vital Signs: Temp: 98 F (36.7 C) (09/20 0455) Temp src: Oral (09/20 0455) BP: 145/91 mmHg (09/20 0455) Pulse Rate: 70 (09/20 0455)  Labs:  Recent Labs  09/11/13 0106 09/11/13 0114  HGB 11.7* 12.2  HCT 33.8* 36.0  PLT 296  --   CREATININE  --  1.00    Estimated Creatinine Clearance: 27.3 ml/min (by C-G formula based on Cr of 1).   Medical History: Past Medical History  Diagnosis Date  . CVA (cerebral vascular accident)   . CAD (coronary artery disease)   . Vitamin D deficiency   . Diabetes mellitus, type 2   . HTN (hypertension)   . HLD (hyperlipidemia)   . Tremor, essential   . Urinary incontinence   . DIABETES-TYPE 2 07/16/2007  . HYPERLIPIDEMIA 03/03/2007  . TOBACCO USER 12/25/2009  . HYPERTENSION, ESSENTIAL NOS 08/07/2007  . CAROTID ARTERY DISEASE 07/16/2007  . C V A/STROKE 07/16/2007  . Palpitations 02/08/2010  . High risk social situations 11/28/2011  . Intention tremor 03/03/2007    Qualifier: Diagnosis of  By: Dorathy Daft  MD, IDAYLIS    . Syncope, near 02/28/2012  . Altered mental status 01/01/2011    Qualifier: Diagnosis of  By: Earnest Bailey MD, Selena Batten    . DEFICIENCY, VITAMIN D NOS 07/16/2007    Qualifier: Diagnosis of  By: Irving Burton MD, Clifton Custard    . Myocardial infarction   . Angina   . Heart murmur   . Asthma   . Shortness of breath   . Pneumonia   . Recurrent upper respiratory infection (URI)   . Hyperthyroidism   . Anemia   . Chronic kidney disease   . H/O hiatal hernia   . Seizures   . Headache(784.0)   . Neuromuscular disorder   . Arthritis   . Fibromyalgia   . CAROTID ARTERY DISEASE 07/16/2007  . Diastolic CHF, chronic  12/31/2011  . TOBACCO USER 12/25/2009    Medications:  Prescriptions prior to admission  Medication Sig Dispense Refill  . acetaminophen (TYLENOL ARTHRITIS PAIN) 650 MG CR tablet Take 1 tablet (650 mg total) by mouth every 8 (eight) hours. For arthritis pain.  90 tablet  5  . aspirin 81 MG chewable tablet Chew 81 mg by mouth daily.      . Black Cohosh 160 MG CAPS Take 160 mg by mouth daily.       [name] CALCIUM PO Take 1 tablet by mouth 2 (two) times daily.       . Cyanocobalamin (B-12) 100 MCG TABS Take 100 mcg by mouth daily.       [name] lisinopril (PRINIVIL,ZESTRIL) 5 MG tablet Take 1 tablet (5 mg total) by mouth daily.  90 tablet  1  . meloxicam (MOBIC) 7.5 MG tablet Take 1 tablet (7.5 mg total) by mouth daily.  30 tablet  0  . metoprolol (LOPRESSOR) 50 MG tablet Take 1 tablet (50 mg total) by mouth 2 (two) times daily.  180 tablet  1  . mirtazapine (REMERON SOL-TAB) 15 MG disintegrating tablet Take 0.5 tablets (7.5 mg total) by mouth at bedtime.  30 tablet  11  . oxybutynin (DITROPAN-XL)  10 MG 24 hr tablet Take 1 tablet (10 mg total) by mouth daily.  30 tablet  5  . simvastatin (ZOCOR) 20 MG tablet Take 20 mg by mouth daily.        Assessment: 79 yo lady to start lovenox for afib.  Her CrCl~27 ml/min.  Baseline Hg 11.7, PTLC 296 Goal of Therapy:  4 hr antiXa level 0.6-1.0 units/ml Monitor platelets by anticoagulation protocol: Yes   Plan:  Lovenox 45 mg sq q24 hours. Check CBC q 3 days while on lovenox.  [name] 09/11/2013,5:54 AM

## 2013-09-11 NOTE — H&P (Signed)
Family Medicine Teaching [facility] Admission History and Physical Service Pager: [phone]  Patient name: [name] Medical record number: [id number] Date of birth: 1920-04-01 Age: 79 y.o. Gender: female  Primary Care Provider: Rodman Pickle, MD Consultants: Cardiology Code Status: Full  Chief Complaint: Palpitations  Assessment and Plan: [name] is a 79 y.o. female presenting with presenting with palpitations found to be in Afib. Her PMH is significant for HF w/ previous MI, CVA, Carotid artery disease,  HTN, DM, Hyperthyroidism, and nicotine use. A-fib initially with RVR in EMS and ED, tachycardia resolved spontaneously in ED. Palpitations similar to previous admission, but also now with some confusion/altered mental status of uncertain cause.  # A-Fib - Paroxysmal, but has been having symptoms on/off for last year. EKG in ED showed A-fib w/ RVR but resolved without intervention. Reports missing recent doses of Metoprolol. [facility] was consulted at last episode 8/28 and arranged placement of 3 week event monitor. Reports chest pressure "feels tight" this admission, but denies fevers, or signs of infection. ECHO on last admit (8/27) showed moderate LVH. Systolic function was normal.  EF 55% to 60%; Lt atrium mildly dilated; Will hold off on repeating for now.  - Pt's VS currently stable; admit to telemetry - MI r/o: Cycle trop's and repeat EKG  - Risk assessment labs: Order TSH; A1c 6.4 (May '14), Lipids Tchol 226 LDH 142 HDL 70 (Jan '13) - Continue metoprolol 50 mg BID (failed inc at last admit due to hypotension) - Continue: ASA 81mg , Zocor 20 mg (consider switching to Lipitor 40-80) - Lovenox per pharmacy - Consult [facility] to review loop recording  # Confusion - Alert but oriented to person only. This is a change from her admission on 8/28. Decreased sensation at CN 5 (V1-V2) reports this has been present for past month, as well as  "difficulties focusing in left eye". Uncertain if sensation symptoms are new/different (pt is vague in their description) - Hold Oxybutynin  - CT head  # HFpEF - EF 55-60%   - no current evidence for fluid overload clinically or on CXR  - continue metoprolol, ASA   # HTN  - will monitor BP, pt is at an appropriate range for age (generally ~150-120/80-70) - Continue Metoprolol and Lisinopril   # Chronic conditions DM2 - per recent outpt note, last A1c 6.4 in May 2014, without plan to recheck A1c or treat with meds  Insomnia - long-standing; on Remeron qHS, continue  Chronic arthritis - Tylenol PRN; holding home Mobic given palpitations and investigation of heart, as above  Chronic urinary incontinence - pt on oxybutynin at home; will hold for now given confusion  Current tobacco abuse - not interested in quitting, currently; has tried patches in the past but did not like them   # Social  - No instate family. [facility] at last d/c 08/19/13, but had recently returned to independent living at home.  - PT/OT evaluation - Case management/CSW consult  FEN/GI:  - Diet: Heart heart - Saline lock - Activity: up with assistance  Prophylaxis: Lovenox  Disposition:  Admit to telemetry, contact CSW and case management for help with placement at discharge  History of Present Illness: [name] is a 79 y.o. female presenting with palpitations (fast and strong heart beat) that began all of sudden yesterday. Her PMH is significant for HF w/ previous MI, CVA, Carotid artery disease,  HTN, DM, Hyperthyroidism, and nicotine use.  She reports that these palpitations have been occuring on and off over the past year. With these episode she endorses chest pressure "Feels tight, like I can't breath with it." She also says she has missed several doses of her medications (including Metoprolol). She reports b/l knee swelling, but notes that this is  chronic and she takes tylenol arthritis and mobic for pain/swelling. She continues to smoke, but denies alcohol use. She denies fevers, cough, burning with urination, difficulties with speech, or weakness. In general, her history/answers are lucid and coherent, but pt does have some confusion/repetition in her statements (see exam below about orientation; stated three separate times about being out of medication and needing to pick things up at the pharmacy, etc).  Previous Episodes - Hospitalized with palpitations and chest discomfort. EKG showed frequent PVC, and no Afib on tele. [facility] was consulted and arranged placement of 3 week event monitor (Loop) to evaluate for presyncope, palpitations, and frequent PVCs which ended 9/19.  - 8/27 2D ECHO: Moderate LVH. Systolic function was normal.  EF 55% to 60%; Lt atrium mildly dilated  ED Course: POCT trop neg; EKG A-fib w/ RVR  Review Of Systems: Per HPI. Otherwise 12 point review of systems was performed and was unremarkable.  Patient Active Problem List   Diagnosis Date Noted  . Palpitations 08/17/2013  . Chest discomfort 08/17/2013  . Trigeminy 08/17/2013  . Toenail deformity 04/30/2013  . Memory loss 09/24/2012  . Insomnia 03/12/2012  . Mobility impaired 01/07/2012  . Diastolic CHF, chronic 12/31/2011  . CAD (coronary artery disease) 12/28/2011  . TOBACCO USER 12/25/2009  . Abnormality of gait 07/20/2009  . OSTEOPOROSIS 10/01/2007  . HYPERTENSION, ESSENTIAL NOS 08/07/2007  . DIABETES-TYPE 2 07/16/2007  . CAROTID ARTERY DISEASE 07/16/2007  . C V A/STROKE 07/16/2007  . URINARY INCONTINENCE, URGE 07/16/2007  . HX, PERSONAL, THROMBOPHLEBITIS 07/16/2007  . HYPERLIPIDEMIA 03/03/2007  . ARTHROPATHY NEC, SHOULDER 03/03/2007  . ARTHRITIS, KNEES, BILATERAL 03/03/1979   Past Medical History: Past Medical History  Diagnosis Date  . CVA (cerebral vascular accident)   . CAD (coronary artery disease)   . Vitamin D deficiency    . Diabetes mellitus, type 2   . HTN (hypertension)   . HLD (hyperlipidemia)   . Tremor, essential   . Urinary incontinence   . DIABETES-TYPE 2 07/16/2007  . HYPERLIPIDEMIA 03/03/2007  . TOBACCO USER 12/25/2009  . HYPERTENSION, ESSENTIAL NOS 08/07/2007  . CAROTID ARTERY DISEASE 07/16/2007  . C V A/STROKE 07/16/2007  . Palpitations 02/08/2010  . High risk social situations 11/28/2011  . Intention tremor 03/03/2007    Qualifier: Diagnosis of  By: Dorathy Daft  MD, IDAYLIS    . Syncope, near 02/28/2012  . Altered mental status 01/01/2011    Qualifier: Diagnosis of  By: Earnest Bailey MD, Selena Batten    . DEFICIENCY, VITAMIN D NOS 07/16/2007    Qualifier: Diagnosis of  By: Irving Burton MD, Clifton Custard    . Myocardial infarction   . Angina   . Heart murmur   . Asthma   . Shortness of breath   . Pneumonia   . Recurrent upper respiratory infection (URI)   . Hyperthyroidism   . Anemia   . Chronic kidney disease   . H/O hiatal hernia   . Seizures   . Headache(784.0)   . Neuromuscular disorder   . Arthritis   . Fibromyalgia   . CAROTID ARTERY DISEASE 07/16/2007  . Diastolic CHF, chronic 12/31/2011  . TOBACCO USER 12/25/2009   Past Surgical History:  Past Surgical History  Procedure Laterality Date  . Appendectomy  1965  . Vaginal hysterectomy  1965  . Knee surgery  1989, 1995   Social History: History  Substance Use Topics  . Smoking status: Current Every Day Smoker -- 0.50 packs/day    Types: Cigarettes  . Smokeless tobacco: Never Used     Comment: does not smoke much...  . Alcohol Use: No   Additional social history: Please also refer to relevant sections of EMR.  Family History: Family History  Problem Relation Age of Onset  . Diabetes Mother   . Diabetes Brother   . Stroke Father    Allergies and Medications: Allergies  Allergen Reactions  . Codeine     Unsure of reaction  . Penicillins     Unsure of reaction    No current facility-administered medications on file prior to encounter.    Current Outpatient Prescriptions on File Prior to Encounter  Medication Sig Dispense Refill  . acetaminophen (TYLENOL ARTHRITIS PAIN) 650 MG CR tablet Take 1 tablet (650 mg total) by mouth every 8 (eight) hours. For arthritis pain.  90 tablet  5  . aspirin 81 MG chewable tablet Chew 81 mg by mouth daily.      . Black Cohosh 160 MG CAPS Take 160 mg by mouth daily.       [name] CALCIUM PO Take 1 tablet by mouth 2 (two) times daily.       . Cyanocobalamin (B-12) 100 MCG TABS Take 100 mcg by mouth daily.       [name] lisinopril (PRINIVIL,ZESTRIL) 5 MG tablet Take 1 tablet (5 mg total) by mouth daily.  90 tablet  1  . meloxicam (MOBIC) 7.5 MG tablet Take 1 tablet (7.5 mg total) by mouth daily.  30 tablet  0  . metoprolol (LOPRESSOR) 50 MG tablet Take 1 tablet (50 mg total) by mouth 2 (two) times daily.  180 tablet  1  . mirtazapine (REMERON SOL-TAB) 15 MG disintegrating tablet Take 0.5 tablets (7.5 mg total) by mouth at bedtime.  30 tablet  11  . oxybutynin (DITROPAN-XL) 10 MG 24 hr tablet Take 1 tablet (10 mg total) by mouth daily.  30 tablet  5  . simvastatin (ZOCOR) 20 MG tablet Take 20 mg by mouth daily.      . [DISCONTINUED] albuterol (PROVENTIL) (2.5 MG/3ML) 0.083% nebulizer solution Take 3 mLs (2.5 mg total) by nebulization every 6 (six) hours as needed for wheezing.  75 mL  12    Objective: BP 126/62  Pulse 71  Temp(Src) 98.2 F (36.8 C) (Oral)  Resp 18  Ht 5\' 3"  (1.6 m)  Wt 107 lb (48.535 kg)  BMI 18.96 kg/m2  SpO2 96% Exam: Gen: Frail elderly F  in NAD Neuro: Alert, Oriented to person, but not time or place; CN 2-4 & 6-12 intact (dec sensation in Lt V1 & V2); UE/LE Stated month is June, location is "[place], [place])  Sensation and Strength grossly intact Head: Normocephalic/Atraumatic Eyes:Sclera white; Conjunctiva pink; PERRLA; EOMI Ears: TMs clear; Canals w/o lacerations; No external lesions Nose: Mucosa pink Throat: Oral mucosa pink and moist, Pharynx  w/o exudates CV: Irregularly irregular rhythm, No m/r/g; pulses 2+ b/l Lungs: Prolonged exp phase, diminished breath sounds GI: BS +; No tenderness or masses Lower Ext: Dry skin; Valgus knees; No edema; distal pulses intact; Calves nontender  Labs and Imaging: Results for orders placed during the hospital encounter of 09/11/13 (from the past 24 hour(s))  CBC     Status: Abnormal   Collection Time    09/11/13  1:06 AM      Result Value Range   WBC 11.5 (*) 4.0 - 10.5 K/uL   RBC 3.93  3.87 - 5.11 MIL/uL   Hemoglobin 11.7 (*) 12.0 - 15.0 g/dL   HCT 16.1 (*) 09.6 - 04.5 %   MCV 86.0  78.0 - 100.0 fL   MCH 29.8  26.0 - 34.0 pg   MCHC 34.6  30.0 - 36.0 g/dL   RDW 40.9  81.1 - 91.4 %   Platelets 296  150 - 400 K/uL  POCT I-STAT TROPONIN I     Status: None   Collection Time    09/11/13  1:13 AM      Result Value Range   Troponin i, poc 0.00  0.00 - 0.08 ng/mL   Comment 3           POCT I-STAT, CHEM 8     Status: Abnormal   Collection Time    09/11/13  1:14 AM      Result Value Range   Sodium 142  135 - 145 mEq/L   Potassium 4.5  3.5 - 5.1 mEq/L   Chloride 108  96 - 112 mEq/L   BUN 25 (*) 6 - 23 mg/dL   Creatinine, Ser 7.82  0.50 - 1.10 mg/dL   Glucose, Bld 956 (*) 70 - 99 mg/dL   Calcium, Ion 2.13  0.86 - 1.30 mmol/L   TCO2 23  0 - 100 mmol/L   Hemoglobin 12.2  12.0 - 15.0 g/dL   HCT 57.8  46.9 - 62.9 %    CXR: IMPRESSION: Emphysema. No acute superimposed abnormality.  Wenda Low, MD 09/11/2013, 2:53 AM PGY-1, [facility] Family Medicine FPTS Intern pager: [phone], text pages welcome  FPTS Upper-Level Resident Addendum  I have independently interviewed and examined the patient. I have discussed the above with Dr. Gayla Doss and agree with his documentation as above. The above reflects his original note with my edits for correction/additions/clarification in orange. Please see also any attending notes.   Bobbye Morton, MD PGY-2, [facility] Family Medicine FPTS  Service pager: [phone] (text pages welcome through [facility])

## 2013-09-12 DIAGNOSIS — F17200 Nicotine dependence, unspecified, uncomplicated: Secondary | ICD-10-CM

## 2013-09-12 DIAGNOSIS — R69 Illness, unspecified: Secondary | ICD-10-CM

## 2013-09-12 LAB — TSH: TSH: 0.66 u[IU]/mL (ref 0.350–4.500)

## 2013-09-12 NOTE — Progress Notes (Signed)
   CARE MANAGEMENT NOTE 09/12/2013  Patient:  [name], [name]   Account Number:  [id number]  Date Initiated:  09/12/2013  Documentation initiated by:  [facility]  Subjective/Objective Assessment:   Adm: Palpitations     Action/Plan:   discharge planning   Anticipated DC Date:  09/13/2013   Anticipated DC Plan:  SKILLED NURSING FACILITY         Choice offered to / List presented to:             Status of service:  Completed, signed off Medicare Important Message given?   (If response is "NO", the following Medicare IM given date fields will be blank) Date Medicare IM given:   Date Additional Medicare IM given:    Discharge Disposition:    Per UR Regulation:    If discussed at Long Length of Stay Meetings, dates discussed:    Comments:  09/12/13 08:00 PT/OT recc. SNF.  Plan is for discharge to SNF Monday, 09/13/13.  No other CM needs communicated. Freddy Jaksch, BSN, CM [phone].

## 2013-09-12 NOTE — Discharge Summary (Signed)
Family Medicine Teaching [facility] Discharge Summary  Patient name: [name] Medical record number: [id number] Date of birth: 1920-09-28 Age: 79 y.o. Gender: female Date of Admission: 09/11/2013  Date of Discharge: 09/14/13 Admitting Physician: Uvaldo Rising, MD  Primary Care Provider: Rodman Pickle, MD Consultants: Cardiology   Indication for Hospitalization: Palpitations  Discharge Diagnoses/Problem List:  Irregular heart rate with PVCs and PACs Cognitive Impairment HFpEF HTN Tobacco abuse DM2 Arthritis (Chronic) Urinary incontinence  Disposition: SNF  Discharge Condition: Stable  Discharge Exam:  General: Well developed, well nourished, in no acute distress. Alert and oriented x 3.  Psych: Good affect, responds appropriately  Neck: No JVD. No masses noted.  Lungs: Clear bilaterally with no wheezes or rhonci noted.  Heart: RRR with no murmurs noted.  Abdomen: Bowel sounds are present. Soft, non-tender.  Extremities: No lower extremity edema.   Brief Hospital Course:  [name] is a 79 y.o. female who presented with palpitations concerning for Afib that has not been captured on telemetry or loop recorder, but was reported by EMS this admission. Her PMH is significant for HFpEF w/ previous MI, CVA, Carotid artery disease, HTN, DM, Hyperthyroidism, and nicotine use. In ED she had a irregular rhythm with tachycardia that resolved spontaneously. She reoprted similar palpitations at previous admission 8/28 and over the past year. Telemetry and EKGs this admission show irregular heart rate with many PACs and PVCs.  Additionally, she was confused at admission (oriented to person,but not place or time) which was changed from her last hospitalization, and her MMSE was 23 revealing cognitive impairment. CT head did not reveal any acute intracranial process, but did show extensive age related atrophy.[facility] (consulted at previous hospitalization) was  again consulted and recommended increasing her Metoprolol to 75 mg BID, continuing her loop monitor and follow up with them as outpatient. Although she was living alone just prior to admission, she was stayed in [facility] after last hospitalization. PT/OT was consulted and recommended SNF placement. She initial did not want to go to SNF for fear of financial difficulties, but agreed that she was unsafe to live alone. She expressed desire to sell her home and move into a nursing facility, but does not have family/friends that could help with this process.    # A-Fib  - Paroxysmal, but has been having symptoms on/off for last year. In ED she had a irregular rhythm (PACs &PVCs) with tachycardia that resolved spontaneously. Reported missing recent doses of Metoprolol. [facility] was consulted at last episode 8/28 and arranged placement of 3 week event monitor which did not record any Afib. ECHO on last admit (8/27) showed moderate LVH. Systolic function was normal. EF 55% to 60%; Lt atrium mildly dilated and was not repeated this admission. Pt's VS remained stabl and she tolerating Metoprolol increase to 75mg  BID. Troponins were Neg x 3, and repeat EKG showed NSR w/ PVCs. Risk assessment labs: TSH wnl ; A1c 6.4 (May '14), Lipids Tchol 226 LDH 142 HDL 70 (Jan '13). Her ASA 81mg  and Zocor 20 mg was continued, and smoking cessation advised. She was treated with Lovenox while in hospital and at discharge this was stopped as no Afib was recorded and she is at high risk for bleeds. [facility] was consulted and suspected atrial fibrillation, recommended increased her Metoprolol to 100 mg BID,and will follow as out patient.    # Confusion  - Alert but oriented to person only at admission. This is a change  from her admission on 8/28. Oxybutynin was discontinued and T head showed extensive age related atrophy with severe leukoariosis, but no acute intracranial process. MMSE was 23 revealing  cognitive impairment.   # HFpEF and HTN- EF 55-60%. No current evidence for fluid overload clinically or on CXR. Metoprolol increased to 100 mg BID and ASA continued.    # Chronic urinary incontinence  - Oxybutynin d/c'd due to cognitive impairment. She continued to void without difficulties  Issues for Follow Up:  1. Close monitoring of BP and Metoprolol increased from 75 mg to 100 mg BID 2. If discharged from SNF; please order home PT and SW 3. Assist with community resources to help sell home if possible  Significant Procedures: None  Significant Labs and Imaging:   Recent Labs Lab 09/11/13 0106 09/11/13 0114 09/11/13 0530  WBC 11.5*  --  9.7  HGB 11.7* 12.2 11.8*  HCT 33.8* 36.0 34.6*  PLT 296  --  300    Recent Labs Lab 09/11/13 0114 09/11/13 0530  NA 142 138  K 4.5 4.5  CL 108 105  CO2  --  24  GLUCOSE 146* 108*  BUN 25* 24*  CREATININE 1.00 0.82  CALCIUM  --  9.6   Imaging/Diagnostic Tests:   CT Head: IMPRESSION:  Extensive age related atrophy with severe leukoariosis. No acute  intracranial process.   CXR: IMPRESSION: Emphysema. No acute superimposed abnormality.  Results/Tests Pending at Time of Discharge: None  Discharge Medications:    Medication List    STOP taking these medications       oxybutynin 10 MG 24 hr tablet  Commonly known as:  DITROPAN-XL      TAKE these medications       acetaminophen 650 MG CR tablet  Commonly known as:  TYLENOL ARTHRITIS PAIN  Take 1 tablet (650 mg total) by mouth every 8 (eight) hours. For arthritis pain.     aspirin 81 MG chewable tablet  Chew 81 mg by mouth daily.     B-12 100 MCG Tabs  Take 100 mcg by mouth daily.     CALCIUM PO  Take 1 tablet by mouth 2 (two) times daily.     lisinopril 5 MG tablet  Commonly known as:  PRINIVIL,ZESTRIL  Take 1 tablet (5 mg total) by mouth daily.     meloxicam 7.5 MG tablet  Commonly known as:  MOBIC  Take 1 tablet (7.5 mg total) by mouth daily.      metoprolol 100 MG tablet  Commonly known as:  LOPRESSOR  Take 1 tablet (100 mg total) by mouth 2 (two) times daily.     mirtazapine 15 MG disintegrating tablet  Commonly known as:  REMERON SOL-TAB  Take 0.5 tablets (7.5 mg total) by mouth at bedtime.     polyethylene glycol powder powder  Commonly known as:  GLYCOLAX/MIRALAX  Take 17 g by mouth once.     simvastatin 20 MG tablet  Commonly known as:  ZOCOR  Take 20 mg by mouth daily.       Discharge Instructions: Please refer to Patient Instructions section of EMR for full details.  Patient was counseled important signs and symptoms that should prompt return to medical care, changes in medications, dietary instructions, activity restrictions, and follow up appointments.   Follow-Up Appointments:   Follow-up Information   Schedule an appointment as soon as possible for a visit with HAIRFORD, AMBER, MD. (Within 2 weeks for hospital follow up)    Specialty:  Family Medicine   Contact information:   [address] [place] [address] [phone]       Schedule an appointment as soon as possible for a visit with Verne Carrow, MD.   Specialty:  Cardiology   Contact information:   [address] [place] [address] [phone]      Wenda Low, MD 09/12/2013, 4:49 AM PGY-1, [facility] Family Medicine

## 2013-09-12 NOTE — Progress Notes (Signed)
Family Medicine Teaching Service Daily Progress Note Intern Pager: [phone]  Patient name: [name] Medical record number: [id number] Date of birth: 1920-06-25 Age: 79 y.o. Gender: female  Primary Care Provider: Rodman Pickle, MD Consultants: Cardiology Code Status: Full  Pt Overview and Major Events to Date:   Assessment and Plan: [name] is a 79 y.o. female presenting with presenting with palpitations found to be in Afib. Her PMH is significant for HF w/ previous MI, CVA, Carotid artery disease, HTN, DM, Hyperthyroidism, and nicotine use. A-fib initially with RVR in EMS and ED, tachycardia resolved spontaneously in ED. Palpitations similar to previous admission, but also now with some confusion/altered mental status of uncertain cause.   # A-Fib  - Paroxysmal, but has been having symptoms on/off for last year. EKG in ED showed A-fib w/ RVR but resolved without intervention. Reports missing recent doses of Metoprolol. [facility] was consulted at last episode 8/28 and arranged placement of 3 week event monitor. Reports chest pressure "feels tight" this admission, but denies fevers, or signs of infection. ECHO on last admit (8/27) showed moderate LVH. Systolic function was normal. EF 55% to 60%; Lt atrium mildly dilated; Will hold off on repeating for now.  - Pt's VS stable;  Tolerating BB inc (BP 150-120/90-60; HR 65-70) - telemetry: NSR altering w/ Afib w/o RVR - MI r/o: Cycle trop's (Neg x 2) and repeat EKG today 9/21 - Risk assessment labs: TSH wnl ; A1c 6.4 (May '14), Lipids Tchol 226 LDH 142 HDL 70 (Jan '13)  - Metoprolol increased to 75 mg BID on 9/20 (failed inc at last admit due to hypotension)  - Continue: ASA 81mg , Zocor 20 mg (consider switching to Lipitor 40-80)  - Lovenox per pharmacy  - Consult [facility]   - loop recorder did not record; asking EP to eval for possible implantable loop recorder  - Consider eliquis @ d/c  # Confusion  -  Alert but oriented to person only at admission. This is a change from her admission on 8/28. Decreased sensation at CN 5 (V1-V2) reports this has been present for past month, as well as "difficulties focusing in left eye". Uncertain if sensation symptoms are new/different (pt is vague in their description)  - Mental status 9/21: Slightly improved- Alert; oriented to person and time but not place  - Holding Oxybutynin  - CT head: Extensive age related atrophy with severe leukoariosis. No acute intracranial process.  # HFpEF - EF 55-60%  - no current evidence for fluid overload clinically or on CXR  - metoprolol, ASA   # HTN  - will monitor BP, pt is at an appropriate range for age  - Continue Metoprolol and Lisinopril   # Chronic conditions  DM2 - per recent outpt note, last A1c 6.4 in May 2014, without plan to recheck A1c or treat with meds  Insomnia - long-standing; on Remeron qHS Chronic arthritis - Tylenol PRN; holding home Mobic given palpitations and investigation of heart, as above  Chronic urinary incontinence - pt on oxybutynin at home; will hold for now given confusion  Current tobacco abuse - not interested in quitting, currently; has tried patches in the past but did not like them   # Social  - No instate family. [facility] to [facility] at last d/c 08/19/13, but had recently returned to independent living at home.  - PT: recommend SNF - OT evaluation  - Case management/CSW consult to assist with placement:  Plan is for discharge to SNF Monday, 09/13/13  FEN/GI:  - Diet: Heart heart  - Saline lock  - Activity: up with assistance   Prophylaxis: Lovenox  Subjective: Resting comfortably in bed without complaints. Denies current palpitations, CP, or SOB. Says she plans to go home at discharge  Objective: Temp:  [98 F (36.7 C)-98.6 F (37 C)] 98.4 F (36.9 C) (09/20 1936) Pulse Rate:  [65-72] 72 (09/20 1936) Resp:  [18-22] 20  (09/20 1936) BP: (117-149)/(63-91) 149/75 mmHg (09/20 1936) SpO2:  [94 %-100 %] 94 % (09/20 1936) Weight:  [104 lb 0.9 oz (47.2 kg)] 104 lb 0.9 oz (47.2 kg) (09/20 0455)  Physical Exam: Gen: Frail elderly F in NAD  Neuro: Alert, Oriented to person and time, but not place  Says she in [facility] Head: Normocephalic/Atraumatic  Eyes:Sclera white; Conjunctiva pink; PERRLA; EOMI  Throat: MMM CV: Irregularly irregular rhythm, No m/r/g; pulses 2+ b/l  Lungs: CTAB, diminished breath sounds b/l  Lower Ext: Dry skin; Valgus knees; No edema; distal pulses intact  Laboratory:  Recent Labs Lab 09/11/13 0106 09/11/13 0114 09/11/13 0530  WBC 11.5*  --  9.7  HGB 11.7* 12.2 11.8*  HCT 33.8* 36.0 34.6*  PLT 296  --  300    Recent Labs Lab 09/11/13 0114 09/11/13 0530  NA 142 138  K 4.5 4.5  CL 108 105  CO2  --  24  BUN 25* 24*  CREATININE 1.00 0.82  CALCIUM  --  9.6  GLUCOSE 146* 108*    Recent Labs Lab 09/11/13 1200 09/11/13 1840  TROPONINI <0.30 <0.30   Imaging/Diagnostic Tests: CT Head: IMPRESSION:  Extensive age related atrophy with severe leukoariosis. No acute  intracranial process.  CXR: IMPRESSION: Emphysema. No acute superimposed abnormality.  Wenda Low, MD 09/12/2013, 4:13 AM PGY-1, [facility] Family Medicine FPTS Intern pager: [phone], text pages welcome

## 2013-09-12 NOTE — Progress Notes (Signed)
Patient ID: [name], female   DOB: 03-24-1920, 79 y.o.   MRN: [id number] Subjective:  "Why am I in the hospital", denies chest pain, sob, or palpitations  Objective:  Vital Signs in the last 24 hours: Temp:  [98.1 F (36.7 C)-98.6 F (37 C)] 98.1 F (36.7 C) (09/21 0520) Pulse Rate:  [65-72] 67 (09/21 0520) Resp:  [18-20] 20 (09/21 0520) BP: (130-156)/(63-98) 156/98 mmHg (09/21 0520) SpO2:  [94 %-100 %] 100 % (09/21 0520)  Intake/Output from previous day: 09/20 0701 - 09/21 0700 In: 120 [P.O.:120] Out: -  Intake/Output from this shift:    Physical Exam: Well appearing elderly woman, NAD HEENT: Unremarkable Neck:  7 cm JVD, no thyromegally Back:  No CVA tenderness Lungs:  Clear with no wheezes, rales, or rhonchi HEART:  Regular rate rhythm, no murmurs, no rubs, no clicks Abd:  Flat, positive bowel sounds, no organomegally, no rebound, no guarding Ext:  2 plus pulses, no edema, no cyanosis, no clubbing Skin:  No rashes no nodules Neuro:  CN II through XII intact, motor grossly intact  Lab Results:  Recent Labs  09/11/13 0106 09/11/13 0114 09/11/13 0530  WBC 11.5*  --  9.7  HGB 11.7* 12.2 11.8*  PLT 296  --  300    Recent Labs  09/11/13 0114 09/11/13 0530  NA 142 138  K 4.5 4.5  CL 108 105  CO2  --  24  GLUCOSE 146* 108*  BUN 25* 24*  CREATININE 1.00 0.82    Recent Labs  09/11/13 1200 09/11/13 1840  TROPONINI <0.30 <0.30   Hepatic Function Panel No results found for this basename: PROT, ALBUMIN, AST, ALT, ALKPHOS, BILITOT, BILIDIR, IBILI,  in the last 72 hours No results found for this basename: CHOL,  in the last 72 hours No results found for this basename: PROTIME,  in the last 72 hours  Imaging: Ct Head Wo Contrast  09/11/2013   *RADIOLOGY REPORT*  Clinical Data: Altered mental status  CT HEAD WITHOUT CONTRAST  Technique:  Contiguous axial images were obtained from the base of the skull through the vertex without contrast.  Comparison:  None.  Findings: Extensive age related atrophy with chronic microvascular ischemic disease is present.  No acute intracranial hemorrhage or infarct.  No mass or midline shift.  No extra-axial fluid collection.  Calvarium is intact.  Orbits are within normal limits.  Paranasal sinuses and mastoid air cells are clear.  IMPRESSION: Extensive age related atrophy with severe leukoariosis.  No acute intracranial process.   Original Report Authenticated By: Rise Mu, M.D.   Dg Chest Portable 1 View  09/11/2013   *RADIOLOGY REPORT*  Clinical Data: Atrial fibrillation  PORTABLE CHEST - 1 VIEW  Comparison: 08/17/2013 and chest CT of 12/28/2011  Findings: Stable cardiomegaly.  Chronic emphysematous changes throughout both lungs.  No focal airspace disease, effusion, or edema.  Negative for pneumothorax.  Degenerative changes of both shoulders, more severe on the right than the left.  Stable convex right scoliosis of the mid to lower thoracic spine.  Diffuse osteopenia.  IMPRESSION: Emphysema.  No acute superimposed abnormality.   Original Report Authenticated By: Britta Mccreedy, M.D.    Cardiac Studies: Tele - NSR with PVC's Assessment/Plan:  1. Palpitations 2. Suspected atrial fibrillation 3. HTN Rec: We have no documented evidence of atrial fibrillation. I would recommend discharge home and outpatient followup. No indication for an ILR at this point.  LOS: 1 day    Shalaine Payson,M.D. 09/12/2013, 9:46 AM

## 2013-09-12 NOTE — Progress Notes (Signed)
FMTS Attending Note  I personally saw and evaluated the patient. The plan of care was discussed with the resident team. I agree with the assessment and plan as documented by the resident.   1. Atrial Fibrillation - not currently in RVR, on ASA, she is likely a poor candidate for coumadin/Eliquis given fall risk/dementia, appreciate cardiology input 2. Encephalopathy - unclear if patient at baseline, will have residents perform MMSE, suspect that patient has baseline dementia  Disposition: Patient would benefit from SNF placement as she currently lives alone, based on her current exam I feel that patient would do poorly if allowed to return to home, appreciate PT/OT/SW input  Donnella Sham MD

## 2013-09-12 NOTE — Progress Notes (Signed)
PCP Note:  Patient admitted for palpitations/A.fib. Overall, she states she feels well today. I appreciate the excellent care of Family Medicine Teaching service and Cardiology while she is here.  Of note, patient was recently discharged from SNF back to live independently at home. I have discussed with her in the past that it concerns me for her to live alone with her mobility issues. She states she is happier at home, but she does feel more weak than usual.   I will be happy to see Ms. Mckenny as an outpatient after discharge. If I can be of any assistance during her hospitalization, please do not hesitate to let me know.  Leighanna Kirn M. Keiandre Cygan, M.D. 09/12/2013 10:27 AM

## 2013-09-13 DIAGNOSIS — R269 Unspecified abnormalities of gait and mobility: Secondary | ICD-10-CM

## 2013-09-13 NOTE — Progress Notes (Signed)
Physical Therapy Treatment Patient Details Name: [name] MRN: [id number] DOB: August 04, 1920 Today's Date: 09/13/2013 Time: 7829-5621 PT Time Calculation (min): 27 min  PT Assessment / Plan / Recommendation  History of Present Illness [name] is a 79 y.o. female presenting with presenting with palpitations found to be in Afib. Her PMH is significant for HF w/ previous MI, CVA, Carotid artery disease,  HTN, DM, Hyperthyroidism, and nicotine use. A-fib initially with RVR in EMS and ED, tachycardia resolved spontaneously in ED. Palpitations similar to previous admission, but also now with some confusion/altered mental status of uncertain cause.   PT Comments   Pt pleasantly confused on arrival and unable to state that she needed to void prior to standing or to clearly state whether she wanted to eat anymore. Pt required assist for pericare and linen change. Pt educated for safety with transfers and gait and recommend RW use at all times. Will follow.   Follow Up Recommendations  SNF     Does the patient have the potential to tolerate intense rehabilitation     Barriers to Discharge        Equipment Recommendations       Recommendations for Other Services    Frequency     Progress towards PT Goals Progress towards PT goals: Progressing toward goals  Plan Current plan remains appropriate    Precautions / Restrictions Precautions Precautions: Fall   Pertinent Vitals/Pain No pain    Mobility  Transfers Transfers: Stand Pivot Transfers Sit to Stand: 4: Min assist;From chair/3-in-1;From bed Stand to Sit: 4: Min assist;To toilet Stand Pivot Transfers: 3: Mod assist Details for Transfer Assistance: cueing for hand placement, safety and use of RW with increased assist to pivot to [facility] as pt incontinent upon standing Ambulation/Gait Ambulation/Gait Assistance: 4: Min assist Ambulation Distance (Feet): 35 Feet Assistive device: Rolling walker Ambulation/Gait Assistance  Details: cueing for posture, position in Rw and safety with fatigue limiting distance. Bil genu recurvatum Gait Pattern: Step-through pattern;Decreased stride length;Trunk flexed;Narrow base of support Gait velocity: decreased Stairs: No    Exercises General Exercises - Lower Extremity Long Arc Quad: AROM;Both;15 reps;Seated Hip Flexion/Marching: AROM;Both;15 reps;Seated   PT Diagnosis:    PT Problem List:   PT Treatment Interventions:     PT Goals (current goals can now be found in the care plan section)    Visit Information  Last PT Received On: 09/13/13 Assistance Needed: +1 History of Present Illness: [name] is a 79 y.o. female presenting with presenting with palpitations found to be in Afib. Her PMH is significant for HF w/ previous MI, CVA, Carotid artery disease,  HTN, DM, Hyperthyroidism, and nicotine use. A-fib initially with RVR in EMS and ED, tachycardia resolved spontaneously in ED. Palpitations similar to previous admission, but also now with some confusion/altered mental status of uncertain cause.    Subjective Data      Cognition  Cognition Arousal/Alertness: Awake/alert Behavior During Therapy: WFL for tasks assessed/performed Area of Impairment: Orientation Orientation Level: Disoriented to;Time Current Attention Level: Selective Memory: Decreased short-term memory    Balance     End of Session PT - End of Session Equipment Utilized During Treatment: Gait belt Activity Tolerance: Patient tolerated treatment well Patient left: in chair;with call bell/phone within reach Nurse Communication: Mobility status   GP     Delorse Lek 09/13/2013, 2:18 PM Delaney Meigs, PT [phone]

## 2013-09-13 NOTE — Progress Notes (Signed)
Subjective:  No CP  No SOB  NO palpitations. Objective: Filed Vitals:   09/12/13 0520 09/12/13 1353 09/12/13 1924 09/13/13 0556  BP: 156/98 112/58 125/58 132/82  Pulse: 67 71 65 65  Temp: 98.1 F (36.7 C) 98.5 F (36.9 C) 98.3 F (36.8 C) 98.1 F (36.7 C)  TempSrc: Oral Oral Oral Oral  Resp: 20 18 18 20   Height:      Weight:      SpO2: 100% 100% 98% 96%   Weight change:   Intake/Output Summary (Last 24 hours) at 09/13/13 0736 Last data filed at 09/12/13 1300  Gross per 24 hour  Intake    240 ml  Output      0 ml  Net    240 ml    General: Alert, awake, oriented x3, in no acute distress Neck:  JVP is normal Heart: Regular rate and rhythm, without murmurs, rubs, gallops.  Lungs: Clear to auscultation.  No rales or wheezes. Exemities:  No edema.   Neuro: Grossly intact, nonfocal.  TEle  SR Lab Results: No results found for this or any previous visit (from the past 24 hour(s)).  Studies/Results: @RISRSLT24 @  Medications:  Reviewed   @PROBHOSP @  Palpitations.  No arrhythmia noted.  Will make sure patient has f/u appt in cardiology Will contact patient   Advised to continue to use event monitor Call with questions.  LOS: 2 days   Dietrich Pates 09/13/2013, 7:36 AM

## 2013-09-13 NOTE — Progress Notes (Signed)
Attending Addendum  I examined the patient and discussed the assessment and plan with Dr. Gayla Doss. I have reviewed the note and agree. The patient is clinically weak marked by the inability to stand and transfer without assistance. She acknowledges her limitations and is amenable to SNF placement. I recommend long term SNF placement as she is at high risk of readmission if she goes home. SW and case management consulted to assist with placement. There has been no repeat palpitations or documented A fib on telemetry.     Dessa Phi, MD FAMILY MEDICINE TEACHING SERVICE

## 2013-09-13 NOTE — Care Management Note (Addendum)
    Page 1 of 1   09/14/2013     3:56:50 PM   CARE MANAGEMENT NOTE 09/14/2013  Patient:  [name], [name]   Account Number:  [id number]  Date Initiated:  09/12/2013  Documentation initiated by:  [facility]  Subjective/Objective Assessment:   Adm: Palpitations     Action/Plan:   discharge planning   Anticipated DC Date:  09/13/2013   Anticipated DC Plan:  SKILLED NURSING FACILITY         Choice offered to / List presented to:             Status of service:  Completed, signed off Medicare Important Message given?   (If response is "NO", the following Medicare IM given date fields will be blank) Date Medicare IM given:   Date Additional Medicare IM given:    Discharge Disposition:  SKILLED NURSING FACILITY  Per UR Regulation:  Reviewed for med. necessity/level of care/duration of stay  If discussed at Long Length of Stay Meetings, dates discussed:    Comments:  09/14/13 Davion Meara,RN,BSN [phone] PT AGREED TO SNF AND WILL DC TO [facility] TODAY.  09/13/13 Corbin Falck,RN,BSN [phone] PT REFUSED SNF PLACEMENT WITH CSW; SPOKE TO HER AGAIN ABOUT POSSIBLE SNF.  PT STATES "I WILL GO BACK TO REHAB IF I HAVE TO."  WILL ASK CSW TO REVISIT PT IN AM.  09/12/13 08:00 PT/OT recc. SNF.  Plan is for discharge to SNF Monday, 09/13/13.  No other CM needs communicated. Freddy Jaksch, BSN, CM [phone].

## 2013-09-13 NOTE — Progress Notes (Signed)
Family Medicine Teaching Service Daily Progress Note Intern Pager: [phone]  Patient name: [name] Medical record number: [id number] Date of birth: 01/01/1920 Age: 79 y.o. Gender: female  Primary Care Provider: Rodman Pickle, MD Consultants: Cardiology Code Status: Full  Pt Overview and Major Events to Date:   Assessment and Plan: [name] is a 79 y.o. female presenting with presenting with palpitations found to be in Afib. Her PMH is significant for HF w/ previous MI, CVA, Carotid artery disease, HTN, DM, Hyperthyroidism, and nicotine use. A-fib initially with RVR in EMS and ED, tachycardia resolved spontaneously in ED. Palpitations similar to previous admission, but also now with some confusion/altered mental status of uncertain cause.   # A-Fib  - Paroxysmal, but has been having symptoms on/off for last year. EKG in ED showed A-fib w/ RVR but resolved without intervention. Reports missing recent doses of Metoprolol. [facility] was consulted at last episode 8/28 and arranged placement of 3 week event monitor. Reports chest pressure "feels tight" this admission, but denies fevers, or signs of infection. ECHO on last admit (8/27) showed moderate LVH. Systolic function was normal. EF 55% to 60%; Lt atrium mildly dilated; Will hold off on repeating for now.  - Pt's VS stable;  Tolerating BB inc (BP 156-112/98-58; HR 65-71) - telemetry: NSR w/ many PVCs and PACs - MI r/o: Cycle trop's (Neg x 3) and repeat EKG 9/20: NSR w/ PVCs - Risk assessment labs: TSH wnl ; A1c 6.4 (May '14), Lipids Tchol 226 LDH 142 HDL 70 (Jan '13)  - Metoprolol increased to 75 mg BID on 9/20 (failed inc at last admit due to hypotension)  - Continue: ASA 81mg , Zocor 20 mg (consider switching to Lipitor 40-80)  - Lovenox per pharmacy  - Consult [facility]   - Suspected atrial fibrillation; No documentation  - Can follow as out patient  # Confusion  - Alert but oriented to person  only at admission. This is a change from her admission on 8/28. Decreased sensation at CN 5 (V1-V2) reports this has been present for past month, as well as "difficulties focusing in left eye". Uncertain if sensation symptoms are new/different (pt is vague in their description)  - Holding Oxybutynin  - CT head: Extensive age related atrophy with severe leukoariosis. No acute intracranial process. - Mental status 9/21: Still confused:  Alert; oriented to person and place but not time  - Will do MMSE today - Will have SW discuss SNF arrangement   # HFpEF - EF 55-60%  - no current evidence for fluid overload clinically or on CXR  - metoprolol, ASA   # HTN  - will monitor BP, pt is at an appropriate range for age  - Continue Metoprolol and Lisinopril   # Chronic conditions  DM2 - per recent outpt note, last A1c 6.4 in May 2014, without plan to recheck A1c or treat with meds  Insomnia - long-standing; on Remeron qHS Chronic arthritis - Tylenol PRN; holding home Mobic given palpitations and investigation of heart, as above  Chronic urinary incontinence - pt on oxybutynin at home; will hold for now given confusion  Current tobacco abuse - not interested in quitting, currently; has tried patches in the past but did not like them   # Social  - No instate family. [facility] at last d/c 08/19/13, but had recently returned to independent living at home.  - PT: recommend SNF - OT evaluation  -  Case management/CSW consult to assist with placement: Plan is for discharge to SNF Monday, 09/13/13  FEN/GI:  - Diet: Heart heart  - Saline lock  - Activity: up with assistance   Prophylaxis: Lovenox  Subjective: Resting comfortably in bed without complaints. Wants to know "why she is here". Long discussion about safety at home, and she admits to not feeling safe at home by herself, but is concerned about the expense of living in assisted care. She  owns her home and does not have family/friends around to help with selling it, but states that "she doesn't want it." Denies current palpitations, CP, or SOB.  Objective: Temp:  [98.1 F (36.7 C)-98.5 F (36.9 C)] 98.1 F (36.7 C) (09/22 0556) Pulse Rate:  [65-71] 65 (09/22 0556) Resp:  [18-20] 20 (09/22 0556) BP: (112-132)/(58-82) 132/82 mmHg (09/22 0556) SpO2:  [96 %-100 %] 96 % (09/22 0556)  Physical Exam: Gen: Frail elderly F in NAD  Neuro: Alert, Oriented to person andplace, but not time "says its June, Wednesday, Mid-afternoon"  Eyes:Sclera white; Conjunctiva pink; PERRLA; EOMI  Throat: MMM CV: Irregular rhythm, No m/r/g; pulses 2+ b/l  Lungs: CTAB, diminished breath sounds b/l  Lower Ext: Dry skin; Valgus knees; No edema; distal pulses intact  Laboratory:  Recent Labs Lab 09/11/13 0106 09/11/13 0114 09/11/13 0530  WBC 11.5*  --  9.7  HGB 11.7* 12.2 11.8*  HCT 33.8* 36.0 34.6*  PLT 296  --  300    Recent Labs Lab 09/11/13 0114 09/11/13 0530  NA 142 138  K 4.5 4.5  CL 108 105  CO2  --  24  BUN 25* 24*  CREATININE 1.00 0.82  CALCIUM  --  9.6  GLUCOSE 146* 108*     Recent Labs Lab 09/11/13 1200 09/11/13 1840  TROPONINI <0.30 <0.30   Imaging/Diagnostic Tests: CT Head: IMPRESSION:  Extensive age related atrophy with severe leukoariosis. No acute  intracranial process.  CXR: IMPRESSION: Emphysema. No acute superimposed abnormality.  Wenda Low, MD 09/13/2013, 6:55 AM PGY-1, [facility] Family Medicine FPTS Intern pager: [phone], text pages welcome

## 2013-09-13 NOTE — Progress Notes (Signed)
CSW spoke with patient about SNF placement. Patient stated that she wanted to go home and refused a SNF placement. CSW passed this information on to CM who will assist with dc planning. CSW continuing to follow if plans change and patient needs SNF.  Maree Krabbe, MSW, Theresia Majors [phone]

## 2013-09-13 NOTE — Evaluation (Signed)
Occupational Therapy Evaluation Patient Details Name: [name] MRN: [id number] DOB: Mar 01, 1920 Today's Date: 09/13/2013 Time: 1325-1350 OT Time Calculation (min): 25 min  OT Assessment / Plan / Recommendation History of present illness [name] is a 79 y.o. female presenting with presenting with palpitations found to be in Afib. Her PMH is significant for HF w/ previous MI, CVA, Carotid artery disease,  HTN, DM, Hyperthyroidism, and nicotine use. A-fib initially with RVR in EMS and ED, tachycardia resolved spontaneously in ED. Palpitations similar to previous admission, but also now with some confusion/altered mental status of uncertain cause.   Clinical Impression   Pt presents requiring minimal assistance for ADL and mobility.  Cognition is suspect and variable and pt lives alone.  Recommend pt return to SNF. Will defer further OT to SNF.    OT Assessment  All further OT needs can be met in the next venue of care    Follow Up Recommendations  SNF    Barriers to Discharge      Equipment Recommendations  None recommended by OT    Recommendations for Other Services    Frequency       Precautions / Restrictions Precautions Precautions: Fall Restrictions Weight Bearing Restrictions: No   Pertinent Vitals/Pain No pain, VSS    ADL  Eating/Feeding: Independent Where Assessed - Eating/Feeding: Edge of bed Grooming: Minimal assistance;Wash/dry hands Where Assessed - Grooming: Unsupported standing Upper Body Bathing: Set up Where Assessed - Upper Body Bathing: Unsupported sitting Lower Body Bathing: Minimal assistance Where Assessed - Lower Body Bathing: Unsupported sitting;Supported sit to stand Upper Body Dressing: Set up Where Assessed - Upper Body Dressing: Unsupported sitting Lower Body Dressing: Minimal assistance Where Assessed - Lower Body Dressing: Unsupported sitting;Supported sit to stand Toilet Transfer: Minimal assistance Toilet Transfer Method:  Stand pivot Toilet Transfer Equipment: Bedside commode Equipment Used: Rolling walker ADL Comments: Able to donn and doff socks without assist. Min assist for LB bathing and dressing when it involves standing.    OT Diagnosis: Generalized weakness;Cognitive deficits  OT Problem List: Decreased strength;Decreased activity tolerance;Impaired balance (sitting and/or standing);Decreased cognition;Decreased safety awareness;Decreased knowledge of use of DME or AE OT Treatment Interventions:     OT Goals(Current goals can be found in the care plan section) Acute Rehab OT Goals Patient Stated Goal: be as independent as possible  Visit Information  Last OT Received On: 09/13/13 Assistance Needed: +1 History of Present Illness: [name] is a 79 y.o. female presenting with presenting with palpitations found to be in Afib. Her PMH is significant for HF w/ previous MI, CVA, Carotid artery disease,  HTN, DM, Hyperthyroidism, and nicotine use. A-fib initially with RVR in EMS and ED, tachycardia resolved spontaneously in ED. Palpitations similar to previous admission, but also now with some confusion/altered mental status of uncertain cause.       Prior Functioning     Home Living Family/patient expects to be discharged to:: Private residence Living Arrangements: Alone Available Help at Discharge: Other (Comment) Type of Home: House Home Access: Stairs to enter [employer] of Steps: 2 Entrance Stairs-Rails: None;Right Home Layout: One level Home Equipment: Cane - single point;Shower seat;Bedside commode Additional Comments: Pt d/c from [facility] and when return home pt began to have increase HR and per pt neighbor called "[phone]" Prior Function Level of Independence: Independent with assistive device(s) Comments: with BADLs, gets Meals on Wheels daily Communication Communication: No difficulties Dominant Hand: Right         Vision/Perception  Vision -  History Baseline Vision: Wears glasses only for reading Patient Visual Report: No change from baseline   Cognition  Cognition Arousal/Alertness: Awake/alert Behavior During Therapy: WFL for tasks assessed/performed Overall Cognitive Status: No family/caregiver present to determine baseline cognitive functioning Area of Impairment: Orientation Orientation Level: Disoriented to;Time Current Attention Level: Selective Memory: Decreased short-term memory    Extremity/Trunk Assessment Upper Extremity Assessment Upper Extremity Assessment: Overall WFL for tasks assessed Lower Extremity Assessment Lower Extremity Assessment: Defer to PT evaluation Cervical / Trunk Assessment Cervical / Trunk Assessment: Kyphotic     Mobility Bed Mobility Bed Mobility: Not assessed Transfers Sit to Stand: 4: Min assist;From chair/3-in-1;From bed Stand to Sit: 4: Min assist;To toilet Details for Transfer Assistance: cueing for hand placement     Exercise    Balance     End of Session OT - End of Session Activity Tolerance: Patient tolerated treatment well Patient left: in chair;with call bell/phone within reach  GO Functional Assessment Tool Used: clinical judgement Functional Limitation: Self care Self Care Current Status (A5409): At least 1 percent but less than 20 percent impaired, limited or restricted Self Care Goal Status (W1191): At least 1 percent but less than 20 percent impaired, limited or restricted Self Care Discharge Status [phone]): At least 1 percent but less than 20 percent impaired, limited or restricted   Evern Bio 09/13/2013, 2:47 PM [phone]

## 2013-09-14 DIAGNOSIS — N3941 Urge incontinence: Secondary | ICD-10-CM

## 2013-09-14 MED ORDER — METOPROLOL TARTRATE 100 MG PO TABS: 100.0000 mg | ORAL_TABLET | Freq: Two times a day (BID) | ORAL | Status: DC

## 2013-09-14 MED ORDER — METOPROLOL TARTRATE 100 MG PO TABS
100.0000 mg | ORAL_TABLET | Freq: Two times a day (BID) | ORAL | Status: DC
  Administered 2013-09-14: 100 mg via ORAL
  Filled 2013-09-14 (×2): qty 1

## 2013-09-14 NOTE — Progress Notes (Signed)
Patient sleeping and had a few short runs of SVT. Then back to S.R. Cont. To monitor patient and rhythm .

## 2013-09-14 NOTE — Progress Notes (Signed)
    SUBJECTIVE: No complaints. No chest pain, SOB.   BP 136/82  Pulse 69  Temp(Src) 98.3 F (36.8 C) (Oral)  Resp 18  Ht 5\' 3"  (1.6 m)  Wt 100 lb 15.5 oz (45.8 kg)  BMI 17.89 kg/m2  SpO2 96%  Intake/Output Summary (Last 24 hours) at 09/14/13 1610 Last data filed at 09/13/13 2000  Gross per 24 hour  Intake    360 ml  Output      0 ml  Net    360 ml    PHYSICAL EXAM General: Well developed, well nourished, in no acute distress. Alert and oriented x 3.  Psych:  Good affect, responds appropriately Neck: No JVD. No masses noted.  Lungs: Clear bilaterally with no wheezes or rhonci noted.  Heart: RRR with no murmurs noted. Abdomen: Bowel sounds are present. Soft, non-tender.  Extremities: No lower extremity edema.   LABS:Cardiac Enzymes: Recent Labs  09/11/13 1200 09/11/13 1840  TROPONINI <0.30 <0.30   Current Meds: . aspirin  81 mg Oral Daily  . enoxaparin (LOVENOX) injection  45 mg Subcutaneous Q24H  . lisinopril  5 mg Oral Daily  . metoprolol  75 mg Oral BID  . mirtazapine  7.5 mg Oral QHS  . simvastatin  20 mg Oral q1800  . sodium chloride  3 mL Intravenous Q12H    ASSESSMENT AND PLAN:  1. Palpitations:  Telemetry reviewed. Pt with run of SVT this am. Frequent PACs and PVCs. No evidence of atrial fibrillation. She has been seen by EP. No indication for loop recorder. She is wearing an event monitor. Will increase Lopressor to 100 mg po BID. Should be ok to discharge to SNF when bed available and have close f/u in our cardiology office with Dr. Ladona Ridgel in the EP clinic as her primary cardiac issue is the SVT. No clear cut indication for anti-coagulation at this point as there has been no documented atrial fibrillation. Will route this note to Dr. Lubertha Basque nurse Tresa Endo to arrange f/u.   MCALHANY,CHRISTOPHER  9/23/20147:12 AM

## 2013-09-14 NOTE — Progress Notes (Signed)
Bed confirmed at [facility]. CSW awaiting dc summary, order and FL2 to be signed.  Maree Krabbe, MSW, [facility] Majors [phone]

## 2013-09-14 NOTE — Progress Notes (Signed)
Report called to Rubie Maid, RN at [facility]. Discharge packet to be sent with PTAR. Per pt's request, friend Lenor Coffin contacted and notified of Transfer. PTAR called by social work.

## 2013-09-14 NOTE — Discharge Summary (Signed)
Attending Addendum  I examined the patient and discussed the discharge plan with Dr. Joyner. I have reviewed the note and agree.    Yeraldine Forney, MD FAMILY MEDICINE TEACHING SERVICE   

## 2013-09-14 NOTE — Progress Notes (Signed)
Clinical Social Worker facilitated patient discharge by contacting the patient and facility, [facility]. Patient agreeable to this plan and arranging transport via EMS . CSW will sign off, as social work intervention is no longer needed.  Maree Krabbe, MSW, [facility]ors [phone]

## 2013-09-14 NOTE — Progress Notes (Signed)
Clinical Social Work Department BRIEF PSYCHOSOCIAL ASSESSMENT 09/14/2013  Patient:  [name], [name]     Account Number:  [id number]     Admit date:  09/11/2013  Clinical Social Worker:  Carren Rang  Date/Time:  09/14/2013 10:27 AM  Referred by:  RN  Date Referred:  09/13/2013 Referred for  SNF Placement   Other Referral:   Interview type:  Patient Other interview type:    PSYCHOSOCIAL DATA Living Status:  ALONE Admitted from facility:   Level of care:   Primary support name:   Primary support relationship to patient:   Degree of support available:   Poor    CURRENT CONCERNS Current Concerns  Post-Acute Placement   Other Concerns:    SOCIAL WORK ASSESSMENT / PLAN CSW received referral that patient was from home alone but was previously from a facility. CSW went into room and introduced self to patient. CSW explained reason for visit and explained the SNF process. Patient stated she does not want to go back to a SNF. Patient has been to [facility] previously. CM then went to speak to patient and CM stated that patient agreed to SNF stating, "If I have to go, I will." CSW spoke to [facility] who stated they can take the patient back and Alvino Chapel from [facility] stated that "insurance will cover the stay." CSW completed FL2 and put it on chart.   Assessment/plan status:  Psychosocial Support/Ongoing Assessment of Needs Other assessment/ plan:   Information/referral to community resources:   SNF information/ Insurance info    PATIENT'S/FAMILY'S RESPONSE TO PLAN OF CARE: Patient agreeable to SNF stating, "If I have to go, I will."       Maree Krabbe, MSW, [employer] [phone]

## 2013-09-28 ENCOUNTER — Ambulatory Visit: Payer: Medicare Other | Admitting: Physician Assistant

## 2013-09-30 ENCOUNTER — Encounter: Payer: Self-pay | Admitting: Physician Assistant

## 2013-09-30 ENCOUNTER — Ambulatory Visit (INDEPENDENT_AMBULATORY_CARE_PROVIDER_SITE_OTHER): Payer: Medicare Other | Admitting: Physician Assistant

## 2013-09-30 VITALS — BP 130/80 | HR 59 | Ht 63.0 in | Wt 107.8 lb

## 2013-09-30 DIAGNOSIS — F17200 Nicotine dependence, unspecified, uncomplicated: Secondary | ICD-10-CM

## 2013-09-30 DIAGNOSIS — R002 Palpitations: Secondary | ICD-10-CM

## 2013-09-30 DIAGNOSIS — I2510 Atherosclerotic heart disease of native coronary artery without angina pectoris: Secondary | ICD-10-CM

## 2013-09-30 NOTE — Assessment & Plan Note (Signed)
Smoking cessation recommended 

## 2013-09-30 NOTE — Assessment & Plan Note (Signed)
Patient denies any chest pain

## 2013-09-30 NOTE — Patient Instructions (Signed)
Your physician recommends that you schedule a follow-up appointment in: 2 months with Dr. Excell Seltzer  Your physician recommends that you continue on your current medications as directed. Please refer to the Current Medication list given to you today.

## 2013-09-30 NOTE — Assessment & Plan Note (Signed)
Tachycardia palpitations have improved on metoprolol 100 mg twice a day. Continue to monitor.

## 2013-09-30 NOTE — Progress Notes (Signed)
HPI:  This is a 79 year old female patient With multiple medical problems who has had trouble with tachycardia palpitations and has had 2 admissions since August for this. ER notes say she was found to be in atrial fibrillation but EMS strips were not available for review. She had an event recorder in our office that showed normal sinus rhythm with PACs and PVCs. 2-D echo showed normal LV function and she was managed with beta blocker therapy. EMS says her heart rate was in the 200s but EKGs in the ER showed normal sinus rhythm. She had no further atrial fibrillation during hospitalization. Consideration was given to a loop recorder but it was chosen to treat her medically. Her beta blocker was adjusted and she had no further arrhythmias in the hospital.  The patient is staying at [facility] medical facility and doing well. She denies any recent tachycardia palpitations. She does smoke approximately a pack a day. She denies chest pain, palpitations, dyspnea, dizziness or presyncope. She is brought today by a friend and is in a wheelchair. She says she does very little activity.  Allergies  -- Codeine -- Other (See Comments)   --  Unsure of reaction  -- Penicillins -- Other (See Comments)   --  Unsure of reaction  Current Outpatient Prescriptions on File Prior to Visit: acetaminophen (TYLENOL ARTHRITIS PAIN) 650 MG CR tablet, Take 1 tablet (650 mg total) by mouth every 8 (eight) hours. For arthritis pain., Disp: 90 tablet, Rfl: 5 aspirin 81 MG chewable tablet, Chew 81 mg by mouth daily., Disp: , Rfl:  CALCIUM PO, Take 1 tablet by mouth 2 (two) times daily. , Disp: , Rfl:  Cyanocobalamin (B-12) 100 MCG TABS, Take 100 mcg by mouth daily. , Disp: , Rfl:  lisinopril (PRINIVIL,ZESTRIL) 5 MG tablet, Take 1 tablet (5 mg total) by mouth daily., Disp: 90 tablet, Rfl: 1 meloxicam (MOBIC) 7.5 MG tablet, Take 1 tablet (7.5 mg total) by mouth daily., Disp: 30 tablet, Rfl: 0 metoprolol (LOPRESSOR) 100 MG tablet,  Take 1 tablet (100 mg total) by mouth 2 (two) times daily., Disp: 60 tablet, Rfl: 1 polyethylene glycol powder (GLYCOLAX/MIRALAX) powder, Take 17 g by mouth once., Disp: , Rfl:  simvastatin (ZOCOR) 20 MG tablet, Take 20 mg by mouth daily., Disp: , Rfl:  [DISCONTINUED] albuterol (PROVENTIL) (2.5 MG/3ML) 0.083% nebulizer solution, Take 3 mLs (2.5 mg total) by nebulization every 6 (six) hours as needed for wheezing., Disp: 75 mL, Rfl: 12  No current facility-administered medications on file prior to visit.   Past Medical History:   CVA (cerebral vascular accident)                               Comment:a. She's not sure that this is true.   CAD (coronary artery disease)                                  Comment:a. reports prior h/o MI's with possible cath @               [facility] in [place] 'many years ago.'   Vitamin D deficiency                                         Diabetes mellitus, type 2  HTN (hypertension)                                           HLD (hyperlipidemia)                                         Tremor, essential                                            Urinary incontinence                                         High risk social situations                     11/28/2011    Intention tremor                                03/03/2007      Comment:Qualifier: Diagnosis of  By: Dorathy Daft  MD,               IDAYLIS     Syncope, near                                   02/28/2012     Altered mental status                           01/01/2011      Comment:Qualifier: Diagnosis of  By: Earnest Bailey MD, Kim     DEFICIENCY, VITAMIN D NOS                       07/16/2007      Comment:Qualifier: Diagnosis of  By: Irving Burton MD,               Clifton Custard     Asthma                                                       History of pneumonia                                         Recurrent upper respiratory infection (URI)                  Hyperthyroidism                                               Anemia  CKD (chronic kidney disease), stage II                       H/O hiatal hernia                                            Seizures                                                     Headache(784.0)                                              Neuromuscular disorder                                       Arthritis                                                    Fibromyalgia                                                 CAROTID ARTERY DISEASE                          07/16/2007      Comment:a. 07/2012 U/S: no signif extracranial carotid               artery stenosis, antegrad vertebral flow.   Diastolic CHF, chronic                                         Comment:a. 07/2013 Echo: EF 55-60%.   TOBACCO USER                                                 Palpitations                                                   Comment:a. 8.2014 Echo: EF 55-60%, no rwma, mod LVH,               mildly dil LA, PASP .   Systolic murmur  Comment:a. no significant valvular abnormalities on               echo 8.2014.  Past Surgical History:   APPENDECTOMY                                     1965         VAGINAL HYSTERECTOMY                             1965         KNEE SURGERY                                     1989, 1995  Review of patient's family history indicates:   Diabetes                       Mother                   Diabetes                       Brother                  Stroke                         Father                   Social History   Marital Status: Widowed             Spouse Name:                      Years of Education:                 Number of children:             Occupational History Occupation          Psychiatric nurse              Retired                                 from [place] [employer]  Social History Main  Topics   Smoking Status: Current Every Day Smoker        Packs/Day: 0.50  Years:           Types: Cigarettes   Smokeless Status: Never Used                       Comment: does not smoke much...   Alcohol Use: No             Drug Use: No             Sexual Activity: No                 Other Topics            Concern   None on file  Social History Narrative   Lives at home alone. Widowed.  Husband was in the [employer].  Able to cook,  bathe and cleans by herself. Walks with cane and crutch. Has a wheelchair but not able to use it.   Does not have anyone she can really trust. Has a friend that works at the [employer] that stops by to help some.   Lives off social security, retired from [employer] in 1988.   After mortgage, has $400 left.    Does not have a POA if anything should happen to her. Has 2 nieces, 2 nephews that live far away. (Only one nephew "knows that I am alive")    She does want CPR attempted but does not wish to have too much done. Organ donor/donating body to science.     Diet consists mostly of greens and veggies. Little meat, must force herself to eat meat.    Smokes 2-4 cigs per day.     ROS: See history of present illness otherwise negative   PHYSICAL EXAM: Well-nournished, in no acute distress. Neck: No JVD, HJR, Bruit, or thyroid enlargement  Lungs: Decreased breath sounds throughout  Cardiovascular: RRR, PMI not displaced, positive S4 no significant murmur, bruit, thrill, or heave.  Abdomen: BS normal. Soft without organomegaly, masses, lesions or tenderness.  Extremities: without cyanosis, clubbing or edema. Good distal pulses bilateral  SKin: Warm, no lesions or rashes   Musculoskeletal: No deformities  Neuro: no focal signs  BP 130/80  Pulse 59  Ht 5\' 3"  (1.6 m)  Wt 107 lb 12.8 oz (48.898 kg)  BMI 19.1 kg/m2   EKG: Sinus bradycardia at 59 beats per minute

## 2013-11-04 ENCOUNTER — Ambulatory Visit: Payer: Self-pay | Admitting: Podiatry

## 2013-11-24 ENCOUNTER — Ambulatory Visit: Payer: Medicare Other | Admitting: Cardiovascular Disease

## 2013-12-03 ENCOUNTER — Encounter (HOSPITAL_COMMUNITY): Payer: Self-pay | Admitting: Emergency Medicine

## 2013-12-03 ENCOUNTER — Emergency Department (HOSPITAL_COMMUNITY)
Admission: EM | Admit: 2013-12-03 | Discharge: 2013-12-04 | Disposition: A | Discharge status: Home / Self Care | Payer: Medicare Other | Attending: Emergency Medicine | Admitting: Emergency Medicine

## 2013-12-03 DIAGNOSIS — N182 Chronic kidney disease, stage 2 (mild): Secondary | ICD-10-CM | POA: Insufficient documentation

## 2013-12-03 DIAGNOSIS — Z8673 Personal history of transient ischemic attack (TIA), and cerebral infarction without residual deficits: Secondary | ICD-10-CM | POA: Insufficient documentation

## 2013-12-03 DIAGNOSIS — R002 Palpitations: Secondary | ICD-10-CM | POA: Insufficient documentation

## 2013-12-03 DIAGNOSIS — M129 Arthropathy, unspecified: Secondary | ICD-10-CM | POA: Insufficient documentation

## 2013-12-03 DIAGNOSIS — R011 Cardiac murmur, unspecified: Secondary | ICD-10-CM | POA: Insufficient documentation

## 2013-12-03 DIAGNOSIS — E785 Hyperlipidemia, unspecified: Secondary | ICD-10-CM | POA: Insufficient documentation

## 2013-12-03 DIAGNOSIS — F17200 Nicotine dependence, unspecified, uncomplicated: Secondary | ICD-10-CM | POA: Insufficient documentation

## 2013-12-03 DIAGNOSIS — J45909 Unspecified asthma, uncomplicated: Secondary | ICD-10-CM | POA: Insufficient documentation

## 2013-12-03 DIAGNOSIS — I129 Hypertensive chronic kidney disease with stage 1 through stage 4 chronic kidney disease, or unspecified chronic kidney disease: Secondary | ICD-10-CM | POA: Insufficient documentation

## 2013-12-03 DIAGNOSIS — I2510 Atherosclerotic heart disease of native coronary artery without angina pectoris: Secondary | ICD-10-CM | POA: Insufficient documentation

## 2013-12-03 DIAGNOSIS — I5032 Chronic diastolic (congestive) heart failure: Secondary | ICD-10-CM | POA: Insufficient documentation

## 2013-12-03 DIAGNOSIS — Z791 Long term (current) use of non-steroidal anti-inflammatories (NSAID): Secondary | ICD-10-CM | POA: Insufficient documentation

## 2013-12-03 DIAGNOSIS — E119 Type 2 diabetes mellitus without complications: Secondary | ICD-10-CM | POA: Insufficient documentation

## 2013-12-03 DIAGNOSIS — Z8719 Personal history of other diseases of the digestive system: Secondary | ICD-10-CM | POA: Insufficient documentation

## 2013-12-03 DIAGNOSIS — Z862 Personal history of diseases of the blood and blood-forming organs and certain disorders involving the immune mechanism: Secondary | ICD-10-CM | POA: Insufficient documentation

## 2013-12-03 DIAGNOSIS — Z79899 Other long term (current) drug therapy: Secondary | ICD-10-CM | POA: Insufficient documentation

## 2013-12-03 DIAGNOSIS — Z8669 Personal history of other diseases of the nervous system and sense organs: Secondary | ICD-10-CM | POA: Insufficient documentation

## 2013-12-03 DIAGNOSIS — Z8701 Personal history of pneumonia (recurrent): Secondary | ICD-10-CM | POA: Insufficient documentation

## 2013-12-03 DIAGNOSIS — Z880 Allergy status to penicillin: Secondary | ICD-10-CM | POA: Insufficient documentation

## 2013-12-03 DIAGNOSIS — Z7982 Long term (current) use of aspirin: Secondary | ICD-10-CM | POA: Insufficient documentation

## 2013-12-03 LAB — COMPREHENSIVE METABOLIC PANEL
ALT: 15 U/L (ref 0–35)
Albumin: 3.3 g/dL — ABNORMAL LOW (ref 3.5–5.2)
Alkaline Phosphatase: 95 U/L (ref 39–117)
BUN: 26 mg/dL — ABNORMAL HIGH (ref 6–23)
CO2: 28 mEq/L (ref 19–32)
Chloride: 107 mEq/L (ref 96–112)
GFR calc Af Amer: 85 mL/min — ABNORMAL LOW (ref >90)
Glucose, Bld: 193 mg/dL — ABNORMAL HIGH (ref 70–99)
Potassium: 5.1 mEq/L (ref 3.5–5.1)
Sodium: 145 mEq/L (ref 135–145)
Total Bilirubin: 0.1 mg/dL — ABNORMAL LOW (ref 0.3–1.2)
Total Protein: 7.3 g/dL (ref 6.0–8.3)

## 2013-12-03 LAB — CBC
Hemoglobin: 12.2 g/dL (ref 12.0–15.0)
MCHC: 33 g/dL (ref 30.0–36.0)
RDW: 15.4 % (ref 11.5–15.5)
WBC: 11.9 10*3/uL — ABNORMAL HIGH (ref 4.0–10.5)

## 2013-12-03 LAB — PRO B NATRIURETIC PEPTIDE: Pro B Natriuretic peptide (BNP): 539.7 pg/mL — ABNORMAL HIGH (ref 0–450)

## 2013-12-03 LAB — TROPONIN I: Troponin I: <0.3 ng/mL (ref <0.30)

## 2013-12-03 LAB — POCT I-STAT TROPONIN I: Troponin i, poc: 0 ng/mL (ref 0.00–0.08)

## 2013-12-03 NOTE — ED Notes (Signed)
Pt reports chest pressure that came on suddenly when she sat down to eat dinner. Pressure lasted approx and then eased up. Pt states still slight chest pressure but not like before. Also states that she has not been feeling well all day. Sob with cp. Denies any other associated sx.

## 2013-12-03 NOTE — ED Notes (Signed)
Per EMS, pt from [facility] complaining of chest pain. Per pt she only has one episode of chest pain right after she ate and lasted a few min. Currently pt has no complaints except for pain in knees.

## 2013-12-04 NOTE — ED Provider Notes (Signed)
CSN: [id number]     Arrival date & time 12/03/13  7829 History   First MD Initiated Contact with Patient 12/03/13 1911     Chief Complaint  Patient presents with  . Chest Pain   (Consider location/radiation/quality/duration/timing/severity/associated sxs/prior Treatment) HPI [name] is a 79 y.o. female who presents to the emergency department for concern of her heart "flipping."  Patient reports that approximately 1 hour PTA she felt this feeling in her chest.  She reports that this was not painful but rather just a funny fleeting feeling.  Whole episode lasted approximately 5 seconds and then went away.  Radiated in to neck and head.  No SOB.  Has not had this since.  Described as mild.  Has had this several times before, but she reports that they have never found out the cause of this in the past.  No other symptoms.  Past Medical History  Diagnosis Date  . CVA (cerebral vascular accident)     a. She's not sure that this is true.  [name] CAD (coronary artery disease)     a. reports prior h/o MI's with possible cath @ [facility] in [place] 'many years ago.'  . Vitamin D deficiency   . Diabetes mellitus, type 2   . HTN (hypertension)   . HLD (hyperlipidemia)   . Tremor, essential   . Urinary incontinence   . High risk social situations 11/28/2011  . Intention tremor 03/03/2007    Qualifier: Diagnosis of  By: Dorathy Daft  MD, IDAYLIS    . Syncope, near 02/28/2012  . Altered mental status 01/01/2011    Qualifier: Diagnosis of  By: Earnest Bailey MD, Selena Batten    . DEFICIENCY, VITAMIN D NOS 07/16/2007    Qualifier: Diagnosis of  By: Irving Burton MD, Clifton Custard    . Asthma   . History of pneumonia   . Recurrent upper respiratory infection (URI)   . Hyperthyroidism   . Anemia   . CKD (chronic kidney disease), stage II   . H/O hiatal hernia   . Seizures   . Headache(784.0)   . Neuromuscular disorder   . Arthritis   . Fibromyalgia   . CAROTID ARTERY DISEASE 07/16/2007    a. 07/2012 U/S: no signif  extracranial carotid artery stenosis, antegrad vertebral flow.  . Diastolic CHF, chronic     a. 07/2013 Echo: EF 55-60%.  . TOBACCO USER   . Palpitations     a. 8.2014 Echo: EF 55-60%, no rwma, mod LVH, mildly dil LA, PASP .  [name] Systolic murmur     a. no significant valvular abnormalities on echo 8.2014.   Past Surgical History  Procedure Laterality Date  . Appendectomy  1965  . Vaginal hysterectomy  1965  . Knee surgery  1989, 1995   Family History  Problem Relation Age of Onset  . Diabetes Mother   . Diabetes Brother   . Stroke Father    History  Substance Use Topics  . Smoking status: Current Every Day Smoker -- 0.50 packs/day    Types: Cigarettes  . Smokeless tobacco: Never Used     Comment: does not smoke much...  . Alcohol Use: No   OB History   Grav Para Term Preterm Abortions TAB SAB Ect Mult Living                 Review of Systems  Constitutional: Negative for fever and chills.  HENT: Negative for congestion and rhinorrhea.   Respiratory: Negative for cough and shortness of  breath.   Cardiovascular: Positive for palpitations (??). Negative for chest pain.  Gastrointestinal: Negative for nausea, vomiting, abdominal pain, diarrhea and abdominal distention.  Endocrine: Negative for polyuria.  Genitourinary: Negative for dysuria.  Musculoskeletal: Negative for neck pain and neck stiffness.  Skin: Negative for rash.  Neurological: Negative for headaches.  Psychiatric/Behavioral: Negative.     Allergies  Codeine and Penicillins  Home Medications   Current Outpatient Rx  Name  Route  Sig  Dispense  Refill  . acetaminophen (TYLENOL ARTHRITIS PAIN) 650 MG CR tablet   Oral   Take 1 tablet (650 mg total) by mouth every 8 (eight) hours. For arthritis pain.   90 tablet   5   . aspirin 81 MG chewable tablet   Oral   Chew 81 mg by mouth daily.         [name] CALCIUM PO   Oral   Take 1 tablet by mouth 2 (two) times daily.          . Cyanocobalamin  (B-12) 100 MCG TABS   Oral   Take 100 mcg by mouth daily.          . meloxicam (MOBIC) 7.5 MG tablet   Oral   Take 1 tablet (7.5 mg total) by mouth daily.   30 tablet   0   . metoprolol (LOPRESSOR) 100 MG tablet   Oral   Take 1 tablet (100 mg total) by mouth 2 (two) times daily.   60 tablet   1   . polyethylene glycol powder (GLYCOLAX/MIRALAX) powder   Oral   Take 17 g by mouth once.         . simvastatin (ZOCOR) 20 MG tablet   Oral   Take 20 mg by mouth daily.         [name] EXPIRED: lisinopril (PRINIVIL,ZESTRIL) 5 MG tablet   Oral   Take 1 tablet (5 mg total) by mouth daily.   90 tablet   1    BP 139/70  Pulse 77  Temp(Src) 98.7 F (37.1 C) (Oral)  Resp 18  SpO2 100% Physical Exam  Nursing note and vitals reviewed. Constitutional: She is oriented to person, place, and time. She appears well-developed and well-nourished. No distress.  HENT:  Head: Normocephalic and atraumatic.  Right Ear: External ear normal.  Left Ear: External ear normal.  Nose: Nose normal.  Mouth/Throat: Oropharynx is clear and moist. No oropharyngeal exudate.  Eyes: EOM are normal. Pupils are equal, round, and reactive to light.  Neck: Normal range of motion. Neck supple. No tracheal deviation present.  Cardiovascular: Normal rate.   Pulmonary/Chest: Effort normal and breath sounds normal. No stridor. No respiratory distress. She has no wheezes. She has no rales.  Abdominal: Soft. She exhibits no distension. There is no tenderness. There is no rebound.  Musculoskeletal: Normal range of motion.  Neurological: She is alert and oriented to person, place, and time.  Skin: Skin is warm and dry. She is not diaphoretic.    ED Course  Procedures (including critical care time) Labs Review Labs Reviewed  CBC - Abnormal; Notable for the following:    WBC 11.9 (*)    Platelets 463 (*)    All other components within normal limits  COMPREHENSIVE METABOLIC PANEL - Abnormal; Notable for the  following:    Glucose, Bld 193 (*)    BUN 26 (*)    Albumin 3.3 (*)    Total Bilirubin 0.1 (*)    GFR calc non Af  Amer 73 (*)    GFR calc Af Amer 85 (*)    All other components within normal limits  PRO B NATRIURETIC PEPTIDE - Abnormal; Notable for the following:    Pro B Natriuretic peptide (BNP) 539.7 (*)    All other components within normal limits  TROPONIN I  POCT I-STAT TROPONIN I   Imaging Review No results found.  EKG Interpretation    Date/Time:  Friday December 03 2013 18:59:10 EST Ventricular Rate:  84 PR Interval:  160 QRS Duration: 73 QT Interval:  361 QTC Calculation: 427 R Axis:   67 Text Interpretation:  Sinus rhythm compared to prior, PVCs no longer present Confirmed by [facility]  MD, TREY ([id number]) on 12/03/2013 7:58:55 PM            MDM   1. Chest pain    [name] is a 79 y.o. female who presents to the ED with feeling that her her "heart flipped" in her chest.  Difficult history to take but it sounds to be palpitations.  Workup initiated and patient with no rhythm abnormalities on EKG or tele.  Delta troponin checked and negative.  Patient declined any further workup as she stated, "I do not want to be admitted to the hospital, because if the lord calls me, I might as well be at home."  Discussed risks and benefits of discharge and patient understands.  Return precautions discussed.  Patient knows that she can return at any time.  Patient will f/u with PCP.  Patient discharged.    Arloa Koh, MD 12/05/13 [phone]

## 2013-12-04 NOTE — ED Notes (Signed)
Report given to Merlene Laughter, Charity fundraiser at [facility]

## 2013-12-05 NOTE — ED Provider Notes (Signed)
I saw and evaluated the patient, reviewed the resident's note and I agree with the findings and plan.  EKG Interpretation    Date/Time:  Friday December 03 2013 18:59:10 EST Ventricular Rate:  84 PR Interval:  160 QRS Duration: 73 QT Interval:  361 QTC Calculation: 427 R Axis:   67 Text Interpretation:  Sinus rhythm compared to prior, PVCs no longer present Confirmed by [facility]  MD, TREY ([id number]) on 12/03/2013 7:58:55 PM            79 yo female presenting with a sensation of "heart flipping".   She stated that this happens to her occasionally and she has been worked up for similar in the past.  Episodes short lived, but occurred more frequently earlier today.  They settled down while in the ED.  She denied chest pain or pressure on my interview.  On exam, well appearing, elderly, heart sounds normal with RRR, lungs CTAb.  Delta Troponin negative and EKG showed no dysrhythmias.  She declined admission for further evaluation and she remained stable and without complaint during ED course.   Clinical Impression: 1. Palpitations      Candyce Churn, MD 12/05/13 1058

## 2013-12-05 NOTE — ED Provider Notes (Signed)
I saw and evaluated the patient, reviewed the resident's note and I agree with the findings and plan.  EKG Interpretation    Date/Time:  Friday December 03 2013 18:59:10 EST Ventricular Rate:  84 PR Interval:  160 QRS Duration: 73 QT Interval:  361 QTC Calculation: 427 R Axis:   67 Text Interpretation:  Sinus rhythm compared to prior, PVCs no longer present Confirmed by [facility]  MD, TREY ([id number]) on 12/03/2013 7:58:55 PM              Candyce Churn, MD 12/05/13 1058

## 2013-12-21 ENCOUNTER — Encounter: Payer: Self-pay | Admitting: Cardiovascular Disease

## 2013-12-29 ENCOUNTER — Encounter: Payer: Self-pay | Admitting: Cardiovascular Disease

## 2014-01-31 ENCOUNTER — Ambulatory Visit: Payer: Self-pay | Admitting: Podiatry

## 2014-02-03 ENCOUNTER — Encounter: Payer: Self-pay | Admitting: Cardiovascular Disease

## 2014-02-03 ENCOUNTER — Ambulatory Visit (INDEPENDENT_AMBULATORY_CARE_PROVIDER_SITE_OTHER): Payer: Medicare Other | Admitting: Cardiovascular Disease

## 2014-02-03 VITALS — BP 124/90 | HR 66 | Ht 63.0 in | Wt 113.1 lb

## 2014-02-03 DIAGNOSIS — I2510 Atherosclerotic heart disease of native coronary artery without angina pectoris: Secondary | ICD-10-CM

## 2014-02-03 DIAGNOSIS — F17200 Nicotine dependence, unspecified, uncomplicated: Secondary | ICD-10-CM

## 2014-02-03 NOTE — Patient Instructions (Signed)
Your physician wants you to follow-up in:  6 months. You will receive a reminder letter in the mail two months in advance. If you don't receive a letter, please call our office to schedule the follow-up appointment.   

## 2014-02-03 NOTE — Progress Notes (Signed)
History of Present Illness: 80 year old female patient with multiple medical problems including SVT who is here today for cardiac follow up. She has been admitted several times over the last year with tachycardia and palpitations. No documented atrial fibrillation. She had an event recorder in our office that showed normal sinus rhythm with PACs and PVCs. 2-D echo showed normal LV function and she was managed with beta blocker therapy. EMS says her heart rate was in the 200s but EKGs in the ER showed normal sinus rhythm. She had no further atrial fibrillation during hospitalization. She was seen by EP and consideration was given to a loop recorder but it was chosen to treat her medically. Her beta blocker was adjusted and she had no further arrhythmias in the hospital.   She is here today for follow up. She lives at [facility]. She has not noticed any palpitations. She does continue to smoke 1 pack per day. She denies chest pain, palpitations, dyspnea, dizziness or presyncope.    Last Lipid Profile:Lipid Panel     Component Value Date/Time   CHOL 226* 12/29/2011 0500   TRIG 70 12/29/2011 0500   HDL 70 12/29/2011 0500   CHOLHDL 3.2 12/29/2011 0500   VLDL 14 12/29/2011 0500   LDLCALC 142* 12/29/2011 0500     Past Medical History  Diagnosis Date  . CVA (cerebral vascular accident)     a. She's not sure that this is true.  [name] CAD (coronary artery disease)     a. reports prior h/o MI's with possible cath @ [facility] in [place] 'many years ago.'  . Vitamin D deficiency   . Diabetes mellitus, type 2   . HTN (hypertension)   . HLD (hyperlipidemia)   . Tremor, essential   . Urinary incontinence   . High risk social situations 11/28/2011  . Intention tremor 03/03/2007    Qualifier: Diagnosis of  By: Dorathy Daft  MD, IDAYLIS    . Syncope, near 02/28/2012  . Altered mental status 01/01/2011    Qualifier: Diagnosis of  By: Earnest Bailey MD, Selena Batten    . DEFICIENCY, VITAMIN D NOS 07/16/2007   Qualifier: Diagnosis of  By: Irving Burton MD, Clifton Custard    . Asthma   . History of pneumonia   . Recurrent upper respiratory infection (URI)   . Hyperthyroidism   . Anemia   . CKD (chronic kidney disease), stage II   . H/O hiatal hernia   . Seizures   . Headache(784.0)   . Neuromuscular disorder   . Arthritis   . Fibromyalgia   . CAROTID ARTERY DISEASE 07/16/2007    a. 07/2012 U/S: no signif extracranial carotid artery stenosis, antegrad vertebral flow.  . Diastolic CHF, chronic     a. 07/2013 Echo: EF 55-60%.  . TOBACCO USER   . Palpitations     a. 8.2014 Echo: EF 55-60%, no rwma, mod LVH, mildly dil LA, PASP .  [name] Systolic murmur     a. no significant valvular abnormalities on echo 8.2014.    Past Surgical History  Procedure Laterality Date  . Appendectomy  1965  . Vaginal hysterectomy  1965  . Knee surgery  1989, 1995    Current Outpatient Prescriptions  Medication Sig Dispense Refill  . acetaminophen (TYLENOL ARTHRITIS PAIN) 650 MG CR tablet Take 1 tablet (650 mg total) by mouth every 8 (eight) hours. For arthritis pain.  90 tablet  5  . aspirin 81 MG chewable tablet Chew 81 mg by mouth  daily.      [name] CALCIUM PO Take 1 tablet by mouth 2 (two) times daily.       . Cyanocobalamin (B-12) 100 MCG TABS Take 100 mcg by mouth daily.       [name] lisinopril (PRINIVIL,ZESTRIL) 10 MG tablet Take 10 mg by mouth daily.      . meloxicam (MOBIC) 7.5 MG tablet Take 1 tablet (7.5 mg total) by mouth daily.  30 tablet  0  . metoprolol (LOPRESSOR) 100 MG tablet Take 1 tablet (100 mg total) by mouth 2 (two) times daily.  60 tablet  1  . polyethylene glycol powder (GLYCOLAX/MIRALAX) powder Take 17 g by mouth once.      . simvastatin (ZOCOR) 20 MG tablet Take 20 mg by mouth daily.      . [DISCONTINUED] albuterol (PROVENTIL) (2.5 MG/3ML) 0.083% nebulizer solution Take 3 mLs (2.5 mg total) by nebulization every 6 (six) hours as needed for wheezing.  75 mL  12   No current facility-administered  medications for this visit.    Allergies  Allergen Reactions  . Codeine Other (See Comments)    Pt states that it knocks her out.   [name] Penicillins Rash    Hives, upset stomach     History   Social History  . Marital Status: Widowed    Spouse Name: N/A    Number of Children: N/A  . Years of Education: N/A   Occupational History  . Retired     from [place] [employer]   Social History Main Topics  . Smoking status: Current Every Day Smoker -- 0.50 packs/day    Types: Cigarettes  . Smokeless tobacco: Never Used     Comment: does not smoke much...  . Alcohol Use: No  . Drug Use: No  . Sexual Activity: No   Other Topics Concern  . Not on file   Social History Narrative   Lives at home alone. Widowed.  Husband was in the [employer].  Able to cook, bathe and cleans by herself. Walks with cane and crutch. Has a wheelchair but not able to use it.   Does not have anyone she can really trust. Has a friend that works at the [employer] that stops by to help some.   Lives off social security, retired from [employer] in 1988.   After mortgage, has $400 left.      Does not have a POA if anything should happen to her. Has 2 nieces, 2 nephews that live far away. (Only one nephew "knows that I am alive")      She does want CPR attempted but does not wish to have too much done. Organ donor/donating body to science.       Diet consists mostly of greens and veggies. Little meat, must force herself to eat meat.      Smokes 2-4 cigs per day.     Family History  Problem Relation Age of Onset  . Diabetes Mother   . Diabetes Brother   . Stroke Father     Review of Systems:  As stated in the HPI and otherwise negative.   BP 124/90  Pulse 66  Ht 5\' 3"  (1.6 m)  Wt 113 lb 1.9 oz (51.311 kg)  BMI 20.04 kg/m2  Physical Examination: General: Well developed, well nourished, NAD HEENT: OP clear, mucus membranes moist SKIN: warm, dry. No rashes. Neuro: No focal  deficits Musculoskeletal: Muscle strength 5/5 all ext Psychiatric: Mood and affect normal Neck: No  JVD, no carotid bruits, no thyromegaly, no lymphadenopathy. Lungs:Clear bilaterally, no wheezes, rhonci, crackles Cardiovascular: Regular rate and rhythm. No murmurs, gallops or rubs. Abdomen:Soft. Bowel sounds present. Non-tender.  Extremities: No lower extremity edema. Pulses are 2 + in the bilateral DP/PT.  EKG: Sinus, rate 66 bpm.   Assessment and Plan:   1. CAD: Stable. NO chest pain.   2. Palpitations/SVT: No recurrence. Continue beta blocker. She has not had documented atrial fib.   3. Tobacco abuse: Smoking cessation is recommended but she does not wish to stop.

## 2014-03-04 ENCOUNTER — Emergency Department (HOSPITAL_COMMUNITY): Payer: Medicare Other

## 2014-03-04 ENCOUNTER — Emergency Department (HOSPITAL_COMMUNITY)
Admission: EM | Admit: 2014-03-04 | Discharge: 2014-03-05 | Disposition: A | Discharge status: Home / Self Care | Payer: Medicare Other | Attending: Emergency Medicine | Admitting: Emergency Medicine

## 2014-03-04 DIAGNOSIS — I2510 Atherosclerotic heart disease of native coronary artery without angina pectoris: Secondary | ICD-10-CM | POA: Insufficient documentation

## 2014-03-04 DIAGNOSIS — E119 Type 2 diabetes mellitus without complications: Secondary | ICD-10-CM | POA: Insufficient documentation

## 2014-03-04 DIAGNOSIS — Z8701 Personal history of pneumonia (recurrent): Secondary | ICD-10-CM | POA: Insufficient documentation

## 2014-03-04 DIAGNOSIS — Z791 Long term (current) use of non-steroidal anti-inflammatories (NSAID): Secondary | ICD-10-CM | POA: Insufficient documentation

## 2014-03-04 DIAGNOSIS — R011 Cardiac murmur, unspecified: Secondary | ICD-10-CM | POA: Insufficient documentation

## 2014-03-04 DIAGNOSIS — F17200 Nicotine dependence, unspecified, uncomplicated: Secondary | ICD-10-CM | POA: Insufficient documentation

## 2014-03-04 DIAGNOSIS — E559 Vitamin D deficiency, unspecified: Secondary | ICD-10-CM | POA: Insufficient documentation

## 2014-03-04 DIAGNOSIS — Z880 Allergy status to penicillin: Secondary | ICD-10-CM | POA: Insufficient documentation

## 2014-03-04 DIAGNOSIS — M129 Arthropathy, unspecified: Secondary | ICD-10-CM | POA: Insufficient documentation

## 2014-03-04 DIAGNOSIS — Z8673 Personal history of transient ischemic attack (TIA), and cerebral infarction without residual deficits: Secondary | ICD-10-CM | POA: Insufficient documentation

## 2014-03-04 DIAGNOSIS — N182 Chronic kidney disease, stage 2 (mild): Secondary | ICD-10-CM | POA: Insufficient documentation

## 2014-03-04 DIAGNOSIS — I252 Old myocardial infarction: Secondary | ICD-10-CM | POA: Insufficient documentation

## 2014-03-04 DIAGNOSIS — I5032 Chronic diastolic (congestive) heart failure: Secondary | ICD-10-CM | POA: Insufficient documentation

## 2014-03-04 DIAGNOSIS — J45909 Unspecified asthma, uncomplicated: Secondary | ICD-10-CM | POA: Insufficient documentation

## 2014-03-04 DIAGNOSIS — R0789 Other chest pain: Secondary | ICD-10-CM | POA: Insufficient documentation

## 2014-03-04 DIAGNOSIS — Z79899 Other long term (current) drug therapy: Secondary | ICD-10-CM | POA: Insufficient documentation

## 2014-03-04 DIAGNOSIS — E785 Hyperlipidemia, unspecified: Secondary | ICD-10-CM | POA: Insufficient documentation

## 2014-03-04 DIAGNOSIS — Z7982 Long term (current) use of aspirin: Secondary | ICD-10-CM | POA: Insufficient documentation

## 2014-03-04 DIAGNOSIS — I129 Hypertensive chronic kidney disease with stage 1 through stage 4 chronic kidney disease, or unspecified chronic kidney disease: Secondary | ICD-10-CM | POA: Insufficient documentation

## 2014-03-04 DIAGNOSIS — R000 Tachycardia, unspecified: Secondary | ICD-10-CM | POA: Insufficient documentation

## 2014-03-04 DIAGNOSIS — Z8719 Personal history of other diseases of the digestive system: Secondary | ICD-10-CM | POA: Insufficient documentation

## 2014-03-04 DIAGNOSIS — D649 Anemia, unspecified: Secondary | ICD-10-CM | POA: Insufficient documentation

## 2014-03-04 DIAGNOSIS — Z8669 Personal history of other diseases of the nervous system and sense organs: Secondary | ICD-10-CM | POA: Insufficient documentation

## 2014-03-04 LAB — I-STAT TROPONIN, ED: TROPONIN I, POC: 0.01 ng/mL (ref 0.00–0.08)

## 2014-03-04 NOTE — ED Provider Notes (Signed)
CSN: [id number]     Arrival date & time 03/04/14  2309 History   First MD Initiated Contact with Patient 03/04/14 2316     Chief Complaint  Patient presents with  . Chest Pain  . Tachycardia     (Consider location/radiation/quality/duration/timing/severity/associated sxs/prior Treatment) HPI Patient has a history of SVT and is on metoprolol and followed by Dr. Sanjuana KavaMcAlhaney. She states that around to 3 PM she began having palpitations with central chest pressure. There is no radiation. She denies any.nausea or shortness of breath. She informed staff to then called EMS. According to paramedics her heart rate was in the 200s. She is given multiple doses of adenosine with no conversion. She also was given 10 mg Cardizem and she then converted to a heart rate of 78. The patient denies any chest pain at this time though she does have some mild tightness. Denies any new lower extremity swelling or pain. Past Medical History  Diagnosis Date  . CVA (cerebral vascular accident)     a. She's not sure that this is true.  [name]. CAD (coronary artery disease)     a. reports prior h/[facility]'s [facility] [place] [facility]  . Vitamin D deficiency   . Diabetes mellitus, type 2   . HTN (hypertension)   . HLD (hyperlipidemia)   . Tremor, essential   . Urinary incontinence   . High risk social situations 11/28/2011  . Intention tremor 03/03/2007    Qualifier: Diagnosis of  By: Dorathy DaftMORONO-PONCE  MD, IDAYLIS    . Syncope, near 02/28/2012  . Altered mental status 01/01/2011    Qualifier: Diagnosis of  By: Earnest BaileyBriscoe MD, Selena BattenKim    . DEFICIENCY, VITAMIN D NOS 07/16/2007    Qualifier: Diagnosis of  By: Irving BurtonLeininger MD, Clifton CustardAaron    . Asthma   . History of pneumonia   . Recurrent upper respiratory infection (URI)   . Hyperthyroidism   . Anemia   . CKD (chronic kidney disease), stage II   . H/O hiatal hernia   . Seizures   . Headache(784.0)   . Neuromuscular disorder   . Arthritis   . Fibromyalgia   .  CAROTID ARTERY DISEASE 07/16/2007    a. 07/2012 U/S: no signif extracranial carotid artery stenosis, antegrad vertebral flow.  . Diastolic CHF, chronic     a. 07/2013 Echo: EF 55-60%.  . TOBACCO USER   . Palpitations     a. 8.2014 Echo: EF 55-60%, no rwma, mod LVH, mildly dil LA, PASP 33mmHg.  [name]. Systolic murmur     a. no significant valvular abnormalities on echo 8.2014.   Past Surgical History  Procedure Laterality Date  . Appendectomy  1965  . Vaginal hysterectomy  1965  . Knee surgery  1989, 1995   Family History  Problem Relation Age of Onset  . Diabetes Mother   . Diabetes Brother   . Stroke Father    History  Substance Use Topics  . Smoking status: Current Every Day Smoker -- 0.50 packs/day    Types: Cigarettes  . Smokeless tobacco: Never Used     Comment: does not smoke much...  . Alcohol Use: No   OB History   Grav Para Term Preterm Abortions TAB SAB Ect Mult Living                 Review of Systems  Constitutional: Negative for fever and chills.  Respiratory: Positive for chest tightness. Negative for shortness of breath.  Cardiovascular: Positive for chest pain and palpitations. Negative for leg swelling.  Gastrointestinal: Negative for nausea, vomiting, abdominal pain and diarrhea.  Musculoskeletal: Negative for back pain, myalgias, neck pain and neck stiffness.  Skin: Negative for rash and wound.  Neurological: Negative for dizziness, seizures, weakness, light-headedness and numbness.  All other systems reviewed and are negative.      Allergies  Codeine and Penicillins  Home Medications   Current Outpatient Rx  Name  Route  Sig  Dispense  Refill  . acetaminophen (TYLENOL ARTHRITIS PAIN) 650 MG CR tablet   Oral   Take 1 tablet (650 mg total) by mouth every 8 (eight) hours. For arthritis pain.   90 tablet   5   . aspirin 81 MG chewable tablet   Oral   Chew 81 mg by mouth daily.         [name] CALCIUM PO   Oral   Take 1 tablet by mouth 2 (two)  times daily.          . Cyanocobalamin (B-12) 100 MCG TABS   Oral   Take 100 mcg by mouth daily.          [name] lisinopril (PRINIVIL,ZESTRIL) 10 MG tablet   Oral   Take 10 mg by mouth daily.         . meloxicam (MOBIC) 7.5 MG tablet   Oral   Take 1 tablet (7.5 mg total) by mouth daily.   30 tablet   0   . metoprolol (LOPRESSOR) 100 MG tablet   Oral   Take 1 tablet (100 mg total) by mouth 2 (two) times daily.   60 tablet   1   . polyethylene glycol powder (GLYCOLAX/MIRALAX) powder   Oral   Take 17 g by mouth once.         . simvastatin (ZOCOR) 20 MG tablet   Oral   Take 20 mg by mouth daily.          BP 143/76  Pulse 91  Temp(Src) 98.6 F (37 C) (Oral)  Resp 19  SpO2 100% Physical Exam  Nursing note and vitals reviewed. Constitutional: She is oriented to person, place, and time. She appears well-developed and well-nourished. No distress.  HENT:  Head: Normocephalic and atraumatic.  Mouth/Throat: Oropharynx is clear and moist.  Eyes: EOM are normal. Pupils are equal, round, and reactive to light.  Neck: Normal range of motion. Neck supple.  Cardiovascular: Normal rate and regular rhythm.  Exam reveals no gallop and no friction rub.   No murmur heard. Pulmonary/Chest: Effort normal and breath sounds normal. No respiratory distress. She has no wheezes. She has no rales.  Abdominal: Soft. Bowel sounds are normal. She exhibits no distension and no mass. There is no tenderness. There is no rebound and no guarding.  Musculoskeletal: Normal range of motion. She exhibits no edema and no tenderness.  No calf swelling or tenderness.  Neurological: She is alert and oriented to person, place, and time.  Results should is without deficit. Sensation grossly intact.  Skin: Skin is warm and dry. No rash noted. No erythema.  Psychiatric: She has a normal mood and affect. Her behavior is normal.    ED Course  Procedures (including critical care time) Labs Review Labs  Reviewed  CBC  BASIC METABOLIC PANEL  I-STAT TROPOININ, ED   Imaging Review No results found.   EKG Interpretation None      Date: 03/05/2014  Rate: 78  Rhythm: normal sinus rhythm  QRS Axis: normal  Intervals: normal  ST/T Wave abnormalities: normal  Conduction Disutrbances:none  Narrative Interpretation:   Old EKG Reviewed: unchanged   MDM   Final diagnoses:  None   discussed with Dr. Shirlee Latch. Advised giving patient her normal dose of metoprolol in the emergency department and discharged home to followup as an outpatient. Her EKG is normal sinus rhythm without any evidence of ischemia. She has troponin x2 that are normal. She's been observed for several hours in the emergency department with stable vital signs. She is resting comfortably. Return precautions given.      Loren Racer, MD 03/05/14 [phone]

## 2014-03-04 NOTE — ED Notes (Signed)
[facility] and tachycardia that started tonight. Pt told facility staff that she felt like her heart was racing, EMS states upon arrival pt HR 215, given 06-04-11 of adenosine with no conversion. Pt also given 10 mg of cardizem which converted pt HR to 78. Pt denies cp at this time, 88 hr upon arrival to ED. Alert and oriented, nad noted skin warm and dry.

## 2014-03-05 LAB — BASIC METABOLIC PANEL
BUN: 29 mg/dL — ABNORMAL HIGH (ref 6–23)
CALCIUM: 10.6 mg/dL — AB (ref 8.4–10.5)
CO2: 26 mEq/L (ref 19–32)
Chloride: 103 mEq/L (ref 96–112)
Creatinine, Ser: 0.97 mg/dL (ref 0.50–1.10)
GFR calc Af Amer: 57 mL/min — ABNORMAL LOW (ref >90)
GFR, EST NON AFRICAN AMERICAN: 50 mL/min — AB (ref >90)
Glucose, Bld: 145 mg/dL — ABNORMAL HIGH (ref 70–99)
POTASSIUM: 4.6 meq/L (ref 3.7–5.3)
SODIUM: 143 meq/L (ref 137–147)

## 2014-03-05 LAB — CBC
HCT: 38.4 % (ref 36.0–46.0)
Hemoglobin: 12.7 g/dL (ref 12.0–15.0)
MCH: 26.6 pg (ref 26.0–34.0)
MCHC: 33.1 g/dL (ref 30.0–36.0)
MCV: 80.3 fL (ref 78.0–100.0)
Platelets: 350 10*3/uL (ref 150–400)
RBC: 4.78 MIL/uL (ref 3.87–5.11)
RDW: 15.8 % — ABNORMAL HIGH (ref 11.5–15.5)
WBC: 9.7 10*3/uL (ref 4.0–10.5)

## 2014-03-05 LAB — TROPONIN I: Troponin I: <0.3 ng/mL (ref <0.30)

## 2014-03-05 MED ORDER — METOPROLOL TARTRATE 25 MG PO TABS
100.0000 mg | ORAL_TABLET | Freq: Once | ORAL | Status: AC
  Administered 2014-03-05: 100 mg via ORAL
  Filled 2014-03-05: qty 4

## 2014-03-05 NOTE — Discharge Instructions (Signed)
: Make an appointment to followup with your cardiologist. Return immediately to the emergency department for worsening chest pain, palpitations, shortness of breath or for any concerns.  Chest Pain (Nonspecific) It is often hard to give a specific diagnosis for the cause of chest pain. There is always a chance that your pain could be related to something serious, such as a heart attack or a blood clot in the lungs. You need to follow up with your caregiver for further evaluation. CAUSES   Heartburn.  Pneumonia or bronchitis.  Anxiety or stress.  Inflammation around your heart (pericarditis) or lung (pleuritis or pleurisy).  A blood clot in the lung.  A collapsed lung (pneumothorax). It can develop suddenly on its own (spontaneous pneumothorax) or from injury (trauma) to the chest.  Shingles infection (herpes zoster virus). The chest wall is composed of bones, muscles, and cartilage. Any of these can be the source of the pain.  The bones can be bruised by injury.  The muscles or cartilage can be strained by coughing or overwork.  The cartilage can be affected by inflammation and become sore (costochondritis). DIAGNOSIS  Lab tests or other studies, such as X-rays, electrocardiography, stress testing, or cardiac imaging, may be needed to find the cause of your pain.  TREATMENT   Treatment depends on what may be causing your chest pain. Treatment may include:  Acid blockers for heartburn.  Anti-inflammatory medicine.  Pain medicine for inflammatory conditions.  Antibiotics if an infection is present.  You may be advised to change lifestyle habits. This includes stopping smoking and avoiding alcohol, caffeine, and chocolate.  You may be advised to keep your head raised (elevated) when sleeping. This reduces the chance of acid going backward from your stomach into your esophagus.  Most of the time, nonspecific chest pain will improve within 2 to 3 days with rest and mild pain  medicine. HOME CARE INSTRUCTIONS   If antibiotics were prescribed, take your antibiotics as directed. Finish them even if you start to feel better.  For the next few days, avoid physical activities that bring on chest pain. Continue physical activities as directed.  Do not smoke.  Avoid drinking alcohol.  Only take over-the-counter or prescription medicine for pain, discomfort, or fever as directed by your caregiver.  Follow your caregiver's suggestions for further testing if your chest pain does not go away.  Keep any follow-up appointments you made. If you do not go to an appointment, you could develop lasting (chronic) problems with pain. If there is any problem keeping an appointment, you must call to reschedule. SEEK MEDICAL CARE IF:   You think you are having problems from the medicine you are taking. Read your medicine instructions carefully.  Your chest pain does not go away, even after treatment.  You develop a rash with blisters on your chest. SEEK IMMEDIATE MEDICAL CARE IF:   You have increased chest pain or pain that spreads to your arm, neck, jaw, back, or abdomen.  You develop shortness of breath, an increasing cough, or you are coughing up blood.  You have severe back or abdominal pain, feel nauseous, or vomit.  You develop severe weakness, fainting, or chills.  You have a fever. THIS IS AN EMERGENCY. Do not wait to see if the pain will go away. Get medical help at once. Call your local emergency services (911 in [place] Do not drive yourself to the hospital. MAKE SURE YOU:   Understand these instructions.  Will watch your condition.  Will get help right away if you are not doing well or get worse. Document Released: 09/18/2005 Document Revised: 03/02/2012 Document Reviewed: 07/14/2008 [facility] Patient Information 2014 [place], [place].  Nonspecific Tachycardia Tachycardia is a faster than normal heartbeat (more than 100 beats per minute). In adults, the  heart normally beats between 60 and 100 times a minute. A fast heartbeat may be a normal response to exercise or stress. It does not necessarily mean that something is wrong. However, sometimes when your heart beats too fast it may not be able to pump enough blood to the rest of your body. This can result in chest pain, shortness of breath, dizziness, and even fainting. Nonspecific tachycardia means that the specific cause or pattern of your tachycardia is unknown. CAUSES  Tachycardia may be harmless or it may be due to a more serious underlying cause. Possible causes of tachycardia include:  Exercise or exertion.  Fever.  Pain or injury.  Infection.  Loss of body fluids (dehydration).  Overactive thyroid.  Lack of red blood cells (anemia).  Anxiety and stress.  Alcohol.  Caffeine.  Tobacco products.  Diet pills.  Illegal drugs.  Heart disease. SYMPTOMS  Rapid or irregular heartbeat (palpitations).  Suddenly feeling your heart beating (cardiac awareness).  Dizziness.  Tiredness (fatigue).  Shortness of breath.  Chest pain.  Nausea.  Fainting. DIAGNOSIS  Your caregiver will perform a physical exam and take your medical history. In some cases, a heart specialist (cardiologist) may be consulted. Your caregiver may also order:  Blood tests.  Electrocardiography. This test records the electrical activity of your heart.  A heart monitoring test. TREATMENT  Treatment will depend on the likely cause of your tachycardia. The goal is to treat the underlying cause of your tachycardia. Treatment methods may include:  Replacement of fluids or blood through an intravenous (IV) tube for moderate to severe dehydration or anemia.  New medicines or changes in your current medicines.  Diet and lifestyle changes.  Treatment for certain infections.  Stress relief or relaxation methods. HOME CARE INSTRUCTIONS   Rest.  Drink enough fluids to keep your urine clear or  pale yellow.  Do not smoke.  Avoid:  Caffeine.  Tobacco.  Alcohol.  Chocolate.  Stimulants such as over-the-counter diet pills or pills that help you stay awake.  Situations that cause anxiety or stress.  Illegal drugs such as marijuana, phencyclidine (PCP), and cocaine.  Only take medicine as directed by your caregiver.  Keep all follow-up appointments as directed by your caregiver. SEEK IMMEDIATE MEDICAL CARE IF:   You have pain in your chest, upper arms, jaw, or neck.  You become weak, dizzy, or feel faint.  You have palpitations that will not go away.  You vomit, have diarrhea, or pass blood in your stool.  Your skin is cool, pale, and wet.  You have a fever that will not go away with rest, fluids, and medicine. MAKE SURE YOU:   Understand these instructions.  Will watch your condition.  Will get help right away if you are not doing well or get worse. Document Released: 01/16/2005 Document Revised: 03/02/2012 Document Reviewed: 11/19/2011 [facility] Patient Information 2014 [place], [place].

## 2014-03-05 NOTE — ED Notes (Signed)
Awaiting PETAR arrival for transport  

## 2014-04-06 ENCOUNTER — Ambulatory Visit (INDEPENDENT_AMBULATORY_CARE_PROVIDER_SITE_OTHER): Payer: Medicare Other | Admitting: Physician Assistant

## 2014-04-06 ENCOUNTER — Encounter: Payer: Self-pay | Admitting: Physician Assistant

## 2014-04-06 VITALS — BP 140/70 | HR 68 | Ht 63.0 in | Wt 131.0 lb

## 2014-04-06 DIAGNOSIS — F17200 Nicotine dependence, unspecified, uncomplicated: Secondary | ICD-10-CM

## 2014-04-06 DIAGNOSIS — I5032 Chronic diastolic (congestive) heart failure: Secondary | ICD-10-CM

## 2014-04-06 DIAGNOSIS — R002 Palpitations: Secondary | ICD-10-CM

## 2014-04-06 DIAGNOSIS — R0789 Other chest pain: Secondary | ICD-10-CM

## 2014-04-06 DIAGNOSIS — I10 Essential (primary) hypertension: Secondary | ICD-10-CM

## 2014-04-06 DIAGNOSIS — I509 Heart failure, unspecified: Secondary | ICD-10-CM

## 2014-04-06 NOTE — Assessment & Plan Note (Signed)
No evidence of heart failure. 

## 2014-04-06 NOTE — Assessment & Plan Note (Signed)
Patient had another emergency room visit for tachyarrhythmias and chest pain. MI ruled out. She did not convert with adenosine but eventually converted with Cardizem 10 mg. I have no EKG or rhythm strips to review. She is on metoprolol 100 mg twice a day and has been stable since. I will make no changes at this time. Followup with Dr. Clifton JamesMcAlhany.

## 2014-04-06 NOTE — Progress Notes (Signed)
HPI:   This is a 80 year old female patient followed by Dr.McAlhany for history of tachycardia palpitations. There has been a question of atrial fibrillation but we've never been able to document this. 2-D echo shows normal LV function and she's been managed with beta blocker therapy. Consideration was given to loop recorder but it was chosen to treat her medically. The patient lives at [facility] medical facility.  On 03/04/14 she developed palpitations and central chest pain without radiation. Paramedics said that her heart rate was in the 200s and she was given multiple doses of adenosine with no conversion. She was also given 10 mg Cardizem and converted to a heart rate of 78. The first EKG showed normal sinus rhythm. Troponins were negative. This was discussed with Dr. Sherlie Ban who recommended she take her regular dose of metoprolol and followup as an outpatient.  Patient has a poor memory and is here by herself. She denies any recurrent chest pain or palpitations. She says if she rests and does not get upset about anything she has no problems. She continues to smoke on occasion.  Allergies -- Codeine -- Other (See Comments)   --  Pt states that it knocks her out.  -- Penicillins -- Rash   --  Hives, upset stomach  Current Outpatient Prescriptions on File Prior to Visit: acetaminophen (TYLENOL ARTHRITIS PAIN) 650 MG CR tablet, Take 1 tablet (650 mg total) by mouth every 8 (eight) hours. For arthritis pain., Disp: 90 tablet, Rfl: 5 calcium carbonate (OS-CAL) 600 MG TABS tablet, Take 600 mg by mouth 2 (two) times daily with a meal., Disp: , Rfl:  meloxicam (MOBIC) 7.5 MG tablet, Take 7.5 mg by mouth daily as needed for pain., Disp: , Rfl:  metoprolol (LOPRESSOR) 100 MG tablet, Take 1 tablet (100 mg total) by mouth 2 (two) times daily., Disp: 60 tablet, Rfl: 1 vitamin B-12 (CYANOCOBALAMIN) 100 MCG tablet, Take 100 mcg by mouth daily., Disp: , Rfl:  [DISCONTINUED] albuterol (PROVENTIL) (2.5 MG/3ML)  0.083% nebulizer solution, Take 3 mLs (2.5 mg total) by nebulization every 6 (six) hours as needed for wheezing., Disp: 75 mL, Rfl: 12  No current facility-administered medications on file prior to visit.   Past Medical History:   CVA (cerebral vascular accident)                               Comment:a. She's not sure that this is true.   CAD (coronary artery disease)                                  Comment:a. reports prior h/o MI's with possible cath @               [facility] in [place] 'many years ago.'   Vitamin D deficiency                                         Diabetes mellitus, type 2                                    HTN (hypertension)  HLD (hyperlipidemia)                                         Tremor, essential                                            Urinary incontinence                                         High risk social situations                     11/28/2011    Intention tremor                                03/03/2007      Comment:Qualifier: Diagnosis of  By: Dorathy DaftMORONO-PONCE  MD,               IDAYLIS     Syncope, near                                   02/28/2012     Altered mental status                           01/01/2011      Comment:Qualifier: Diagnosis of  By: Earnest BaileyBriscoe MD, Kim     DEFICIENCY, VITAMIN D NOS                       07/16/2007      Comment:Qualifier: Diagnosis of  By: Irving BurtonLeininger MD,               Clifton CustardAaron     Asthma                                                       History of pneumonia                                         Recurrent upper respiratory infection (URI)                  Hyperthyroidism                                              Anemia                                                       CKD (chronic kidney disease), stage II  H/O hiatal hernia                                            Seizures                                                     Headache(784.0)                                               Neuromuscular disorder                                       Arthritis                                                    Fibromyalgia                                                 CAROTID ARTERY DISEASE                          07/16/2007      Comment:a. 07/2012 [place]: no signif extracranial carotid               artery stenosis, antegrad vertebral flow.   Diastolic CHF, chronic                                         Comment:a. 07/2013 Echo: EF 55-60%.   TOBACCO USER                                                 Palpitations                                                   Comment:a. 8.2014 Echo: EF 55-60%, no rwma, mod LVH,               mildly dil LA, PASP 33mmHg.   Systolic murmur                                                Comment:a. no significant valvular abnormalities on               echo  8.2014.  Past Surgical History:   APPENDECTOMY                                     1965         VAGINAL HYSTERECTOMY                             1965         KNEE SURGERY                                     1989, 1995  Review of patient's family history indicates:   Diabetes                       Mother                   Diabetes                       Brother                  Stroke                         Father                   Social History   Marital Status: Widowed             Spouse Name:                      Years of Education:                 Number of children:             Occupational History Occupation          Psychiatric nurse              Retired                                 from [place] [employer]  Social History Main Topics   Smoking Status: Former Smoker                   Packs/Day: 0.50  Years:           Types: Cigarettes     Quit date: 04/06/2012   Smokeless Status: Never Used                       Comment: does not smoke much...   Alcohol Use: No             Drug Use: No             Sexual Activity: No                  Other Topics            Concern   None on file  Social History Narrative   Lives at home alone. Widowed.  Husband was in the [employer].  Able to cook, bathe and cleans by herself.  Walks with cane and crutch. Has a wheelchair but not able to use it.   Does not have anyone she can really trust. Has a friend that works at the [employer] that stops by to help some.   Lives off social security, retired from [employer] in 1988.   After mortgage, has $400 left.    Does not have a POA if anything should happen to her. Has 2 nieces, 2 nephews that live far away. (Only one nephew "knows that I am alive")    She does want CPR attempted but does not wish to have too much done. Organ donor/donating body to science.     Diet consists mostly of greens and veggies. Little meat, must force herself to eat meat.    Smokes 2-4 cigs per day.     ROS: In a wheelchair see history of present illness otherwise negative   PHYSICAL EXAM: Well-nournished, in no acute distress. Neck: No JVD, HJR, Bruit, or thyroid enlargement  Lungs: Decreased breath sounds with crackles at the bases  Cardiovascular: RRR, PMI not displaced, 2/6 systolic murmur at the left sternal border, no gallops, bruit, thrill, or heave.  Abdomen: BS normal. Soft without organomegaly, masses, lesions or tenderness.  Extremities: Trace of ankle edema left greater than right, otherwise lower extremity without cyanosis, clubbing. Good distal pulses bilateral  SKin: Warm, no lesions or rashes   Musculoskeletal: No deformities  Neuro: no focal signs  BP 140/70  Pulse 68  Ht 5\' 3"  (1.6 m)  Wt 131 lb (59.421 kg)  BMI 23.21 kg/m2   EKG: Normal sinus rhythm normal EKG

## 2014-04-06 NOTE — Patient Instructions (Addendum)
Your physician recommends that you schedule a follow-up appointment in: WITH DR. Clifton JamesMCALHANY IN 1-2 MONTH  Your physician recommends that you continue on your current medications as directed. Please refer to the Current Medication list given to you today.

## 2014-04-06 NOTE — Assessment & Plan Note (Signed)
No further chest pain. Usually occurs with the tachyarrhythmias

## 2014-04-06 NOTE — Assessment & Plan Note (Signed)
Advised to quit.  

## 2014-05-08 IMAGING — CR DG CHEST 1V PORT
1 series · 1 of 1 positions shown · non-contrast
Comparison: 05/02/2012

CLINICAL DATA: Palpitations

PORTABLE CHEST - 1 VIEW

[AP]
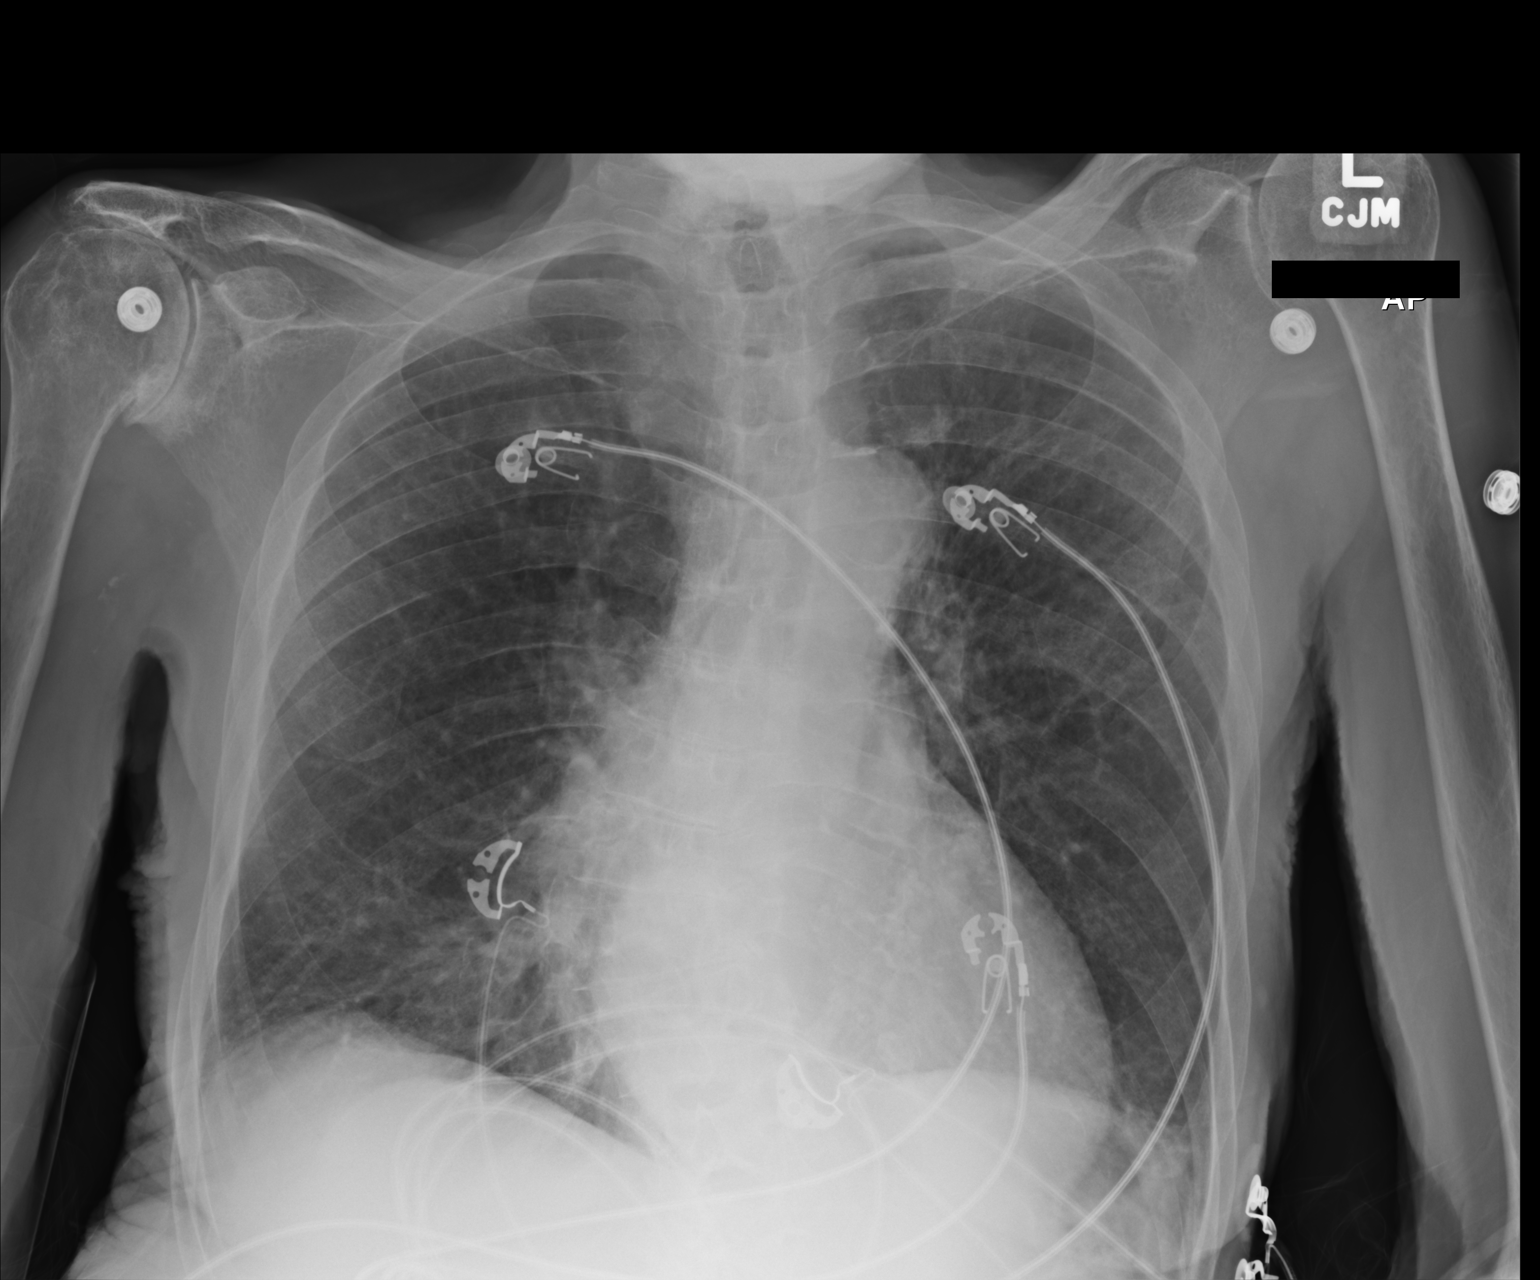

[1 of 1 positions shown; findings below may reference images not displayed]

FINDINGS: Cardiac enlargement.  Negative for heart failure.
Negative for infiltrate effusion or mass.  No change from the prior
study.  There is thoracic dextroscoliosis.

Chronic rotator cuff tear bilaterally.
IMPRESSION: Cardiac enlargement.  No acute cardiopulmonary abnormality.

## 2014-05-11 NOTE — Progress Notes (Signed)
Late entry for missed G-code.  Based on review of documentation by [facility]endy Houston, PT.  09/11/13 1300  PT G-Codes **NOT FOR INPATIENT CLASS**  Functional Assessment Tool Used Clinical judgement based on review of medical record  Functional Limitation Mobility: Walking and moving around  Mobility: Walking and Moving Around Current Status ([id number](G8978) CL  Mobility: Walking and Moving Around Goal Status [phone] CI  Lavona MoundMark Aiana Nordquist, PT  [phone] 05/11/2014

## 2014-05-17 ENCOUNTER — Encounter: Payer: Self-pay | Admitting: Cardiovascular Disease

## 2014-05-27 ENCOUNTER — Emergency Department (HOSPITAL_COMMUNITY)
Admission: EM | Admit: 2014-05-27 | Discharge: 2014-05-27 | Disposition: A | Discharge status: Skilled Nursing Facility | Payer: Medicare Other | Attending: Emergency Medicine | Admitting: Emergency Medicine

## 2014-05-27 ENCOUNTER — Encounter (HOSPITAL_COMMUNITY): Payer: Self-pay | Admitting: Emergency Medicine

## 2014-05-27 ENCOUNTER — Emergency Department (HOSPITAL_COMMUNITY): Payer: Medicare Other

## 2014-05-27 DIAGNOSIS — Z79899 Other long term (current) drug therapy: Secondary | ICD-10-CM | POA: Insufficient documentation

## 2014-05-27 DIAGNOSIS — F17200 Nicotine dependence, unspecified, uncomplicated: Secondary | ICD-10-CM | POA: Insufficient documentation

## 2014-05-27 DIAGNOSIS — Z8673 Personal history of transient ischemic attack (TIA), and cerebral infarction without residual deficits: Secondary | ICD-10-CM | POA: Insufficient documentation

## 2014-05-27 DIAGNOSIS — Z8669 Personal history of other diseases of the nervous system and sense organs: Secondary | ICD-10-CM | POA: Insufficient documentation

## 2014-05-27 DIAGNOSIS — N390 Urinary tract infection, site not specified: Secondary | ICD-10-CM | POA: Insufficient documentation

## 2014-05-27 DIAGNOSIS — R011 Cardiac murmur, unspecified: Secondary | ICD-10-CM | POA: Insufficient documentation

## 2014-05-27 DIAGNOSIS — E785 Hyperlipidemia, unspecified: Secondary | ICD-10-CM | POA: Insufficient documentation

## 2014-05-27 DIAGNOSIS — J45909 Unspecified asthma, uncomplicated: Secondary | ICD-10-CM | POA: Insufficient documentation

## 2014-05-27 DIAGNOSIS — IMO0001 Reserved for inherently not codable concepts without codable children: Secondary | ICD-10-CM | POA: Insufficient documentation

## 2014-05-27 DIAGNOSIS — E119 Type 2 diabetes mellitus without complications: Secondary | ICD-10-CM | POA: Insufficient documentation

## 2014-05-27 DIAGNOSIS — I5032 Chronic diastolic (congestive) heart failure: Secondary | ICD-10-CM | POA: Insufficient documentation

## 2014-05-27 DIAGNOSIS — Z8719 Personal history of other diseases of the digestive system: Secondary | ICD-10-CM | POA: Insufficient documentation

## 2014-05-27 DIAGNOSIS — Z862 Personal history of diseases of the blood and blood-forming organs and certain disorders involving the immune mechanism: Secondary | ICD-10-CM | POA: Insufficient documentation

## 2014-05-27 DIAGNOSIS — Z8701 Personal history of pneumonia (recurrent): Secondary | ICD-10-CM | POA: Insufficient documentation

## 2014-05-27 DIAGNOSIS — N182 Chronic kidney disease, stage 2 (mild): Secondary | ICD-10-CM | POA: Insufficient documentation

## 2014-05-27 DIAGNOSIS — I129 Hypertensive chronic kidney disease with stage 1 through stage 4 chronic kidney disease, or unspecified chronic kidney disease: Secondary | ICD-10-CM | POA: Insufficient documentation

## 2014-05-27 DIAGNOSIS — Z7982 Long term (current) use of aspirin: Secondary | ICD-10-CM | POA: Insufficient documentation

## 2014-05-27 DIAGNOSIS — F29 Unspecified psychosis not due to a substance or known physiological condition: Secondary | ICD-10-CM | POA: Insufficient documentation

## 2014-05-27 DIAGNOSIS — Z880 Allergy status to penicillin: Secondary | ICD-10-CM | POA: Insufficient documentation

## 2014-05-27 DIAGNOSIS — I2510 Atherosclerotic heart disease of native coronary artery without angina pectoris: Secondary | ICD-10-CM | POA: Insufficient documentation

## 2014-05-27 LAB — CBC WITH DIFFERENTIAL/PLATELET
BASOS ABS: 0.1 10*3/uL (ref 0.0–0.1)
Basophils Relative: 1 % (ref 0–1)
EOS ABS: 0.2 10*3/uL (ref 0.0–0.7)
Eosinophils Relative: 2 % (ref 0–5)
HCT: 31.4 % — ABNORMAL LOW (ref 36.0–46.0)
HEMOGLOBIN: 9.8 g/dL — AB (ref 12.0–15.0)
Lymphocytes Relative: 11 % — ABNORMAL LOW (ref 12–46)
Lymphs Abs: 1 10*3/uL (ref 0.7–4.0)
MCH: 24.6 pg — AB (ref 26.0–34.0)
MCHC: 31.2 g/dL (ref 30.0–36.0)
MCV: 78.7 fL (ref 78.0–100.0)
Monocytes Absolute: 1.2 10*3/uL — ABNORMAL HIGH (ref 0.1–1.0)
Monocytes Relative: 14 % — ABNORMAL HIGH (ref 3–12)
NEUTROS ABS: 6.4 10*3/uL (ref 1.7–7.7)
Neutrophils Relative %: 72 % (ref 43–77)
PLATELETS: 384 10*3/uL (ref 150–400)
RBC: 3.99 MIL/uL (ref 3.87–5.11)
RDW: 15 % (ref 11.5–15.5)
WBC: 8.8 10*3/uL (ref 4.0–10.5)

## 2014-05-27 LAB — URINALYSIS, ROUTINE W REFLEX MICROSCOPIC
BILIRUBIN URINE: NEGATIVE
Glucose, UA: NEGATIVE mg/dL
HGB URINE DIPSTICK: NEGATIVE
Ketones, ur: NEGATIVE mg/dL
Nitrite: NEGATIVE
PROTEIN: NEGATIVE mg/dL
Specific Gravity, Urine: 1.025 (ref 1.005–1.030)
Urobilinogen, UA: 0.2 mg/dL (ref 0.0–1.0)
pH: 6 (ref 5.0–8.0)

## 2014-05-27 LAB — I-STAT CHEM 8, ED
BUN: 30 mg/dL — AB (ref 6–23)
CALCIUM ION: 1.31 mmol/L — AB (ref 1.13–1.30)
Chloride: 103 mEq/L (ref 96–112)
Creatinine, Ser: 1.2 mg/dL — ABNORMAL HIGH (ref 0.50–1.10)
Glucose, Bld: 114 mg/dL — ABNORMAL HIGH (ref 70–99)
HEMATOCRIT: 36 % (ref 36.0–46.0)
HEMOGLOBIN: 12.2 g/dL (ref 12.0–15.0)
Potassium: 4.3 mEq/L (ref 3.7–5.3)
SODIUM: 146 meq/L (ref 137–147)
TCO2: 24 mmol/L (ref 0–100)

## 2014-05-27 LAB — URINE MICROSCOPIC-ADD ON

## 2014-05-27 LAB — PROTIME-INR
INR: 1.08 (ref 0.00–1.49)
PROTHROMBIN TIME: 13.8 s (ref 11.6–15.2)

## 2014-05-27 LAB — PRO B NATRIURETIC PEPTIDE: Pro B Natriuretic peptide (BNP): 214.6 pg/mL (ref 0–450)

## 2014-05-27 LAB — I-STAT TROPONIN, ED: TROPONIN I, POC: 0.01 ng/mL (ref 0.00–0.08)

## 2014-05-27 MED ORDER — SODIUM CHLORIDE 0.9 % IV BOLUS (SEPSIS)
500.0000 mL | Freq: Once | INTRAVENOUS | Status: AC
  Administered 2014-05-27: 500 mL via INTRAVENOUS

## 2014-05-27 MED ORDER — CEPHALEXIN 250 MG PO CAPS
500.0000 mg | ORAL_CAPSULE | Freq: Once | ORAL | Status: AC
  Administered 2014-05-27: 500 mg via ORAL
  Filled 2014-05-27: qty 2

## 2014-05-27 MED ORDER — SODIUM CHLORIDE 0.9 % IV SOLN
INTRAVENOUS | Status: DC
Start: 2014-05-27 — End: 2014-05-27
  Administered 2014-05-27: 16:00:00 via INTRAVENOUS

## 2014-05-27 MED ORDER — CEPHALEXIN 500 MG PO CAPS: 500.0000 mg | ORAL_CAPSULE | Freq: Four times a day (QID) | ORAL | Status: DC

## 2014-05-27 MED ORDER — FENTANYL CITRATE 0.05 MG/ML IJ SOLN: 50.0000 ug | Freq: Once | INTRAMUSCULAR | Status: DC

## 2014-05-27 NOTE — ED Notes (Signed)
Pt from [facility]-- [address]. Pt confused to place, time. Cooperative,

## 2014-05-27 NOTE — ED Notes (Signed)
Per EMS, pt c/o chest pressure in central chest that started today, pt has hx if dementia not an appropriate historian, pt is currently being treated for a UTI with oral cipro

## 2014-05-27 NOTE — ED Notes (Signed)
Pt has had no further episodes of SVT, after receiving IV fluids. States is hungry and thirsty.

## 2014-05-27 NOTE — ED Notes (Signed)
Pt alert yet confused. Awaiting [facility] transfer

## 2014-05-27 NOTE — ED Provider Notes (Signed)
CSN: [id number]     Arrival date & time 05/27/14  1247 History   First MD Initiated Contact with Patient 05/27/14 1257     Chief Complaint  Patient presents with  . Chest Pain     (Consider location/radiation/quality/duration/timing/severity/associated sxs/prior Treatment) Patient is a 80 y.o. female presenting with chest pain. The history is provided by the patient and the nursing home.  Chest Pain  She was apparently sent here for chest pressure. The patient cannot give history.  Level V caveat- depression  Past Medical History  Diagnosis Date  . CVA (cerebral vascular accident)     a. She's not sure that this is true.  [name] CAD (coronary artery disease)     a. reports prior h/o MI's with possible cath @ [facility] in [place] 'many years ago.'  . Vitamin D deficiency   . Diabetes mellitus, type 2   . HTN (hypertension)   . HLD (hyperlipidemia)   . Tremor, essential   . Urinary incontinence   . High risk social situations 11/28/2011  . Intention tremor 03/03/2007    Qualifier: Diagnosis of  By: Dorathy Daft  MD, IDAYLIS    . Syncope, near 02/28/2012  . Altered mental status 01/01/2011    Qualifier: Diagnosis of  By: Earnest Bailey MD, Selena Batten    . DEFICIENCY, VITAMIN D NOS 07/16/2007    Qualifier: Diagnosis of  By: Irving Burton MD, Clifton Custard    . Asthma   . History of pneumonia   . Recurrent upper respiratory infection (URI)   . Hyperthyroidism   . Anemia   . CKD (chronic kidney disease), stage II   . H/O hiatal hernia   . Seizures   . Headache(784.0)   . Neuromuscular disorder   . Arthritis   . Fibromyalgia   . CAROTID ARTERY DISEASE 07/16/2007    a. 07/2012 U/S: no signif extracranial carotid artery stenosis, antegrad vertebral flow.  . Diastolic CHF, chronic     a. 07/2013 Echo: EF 55-60%.  . TOBACCO USER   . Palpitations     a. 8.2014 Echo: EF 55-60%, no rwma, mod LVH, mildly dil LA, PASP .  [name] Systolic murmur     a. no significant valvular abnormalities on echo 8.2014.   Past  Surgical History  Procedure Laterality Date  . Appendectomy  1965  . Vaginal hysterectomy  1965  . Knee surgery  1989, 1995   Family History  Problem Relation Age of Onset  . Diabetes Mother   . Diabetes Brother   . Stroke Father    History  Substance Use Topics  . Smoking status: Current Some Day Smoker -- 0.50 packs/day    Types: Cigarettes    Last Attempt to Quit: 04/06/2012  . Smokeless tobacco: Never Used     Comment: does not smoke much...  . Alcohol Use: No   OB History   Grav Para Term Preterm Abortions TAB SAB Ect Mult Living                 Review of Systems  Unable to perform ROS Cardiovascular: Positive for chest pain.      Allergies  Codeine and Penicillins  Home Medications   Prior to Admission medications   Medication Sig Start Date End Date Taking? Authorizing Provider  acetaminophen (TYLENOL ARTHRITIS PAIN) 650 MG CR tablet Take 1 tablet (650 mg total) by mouth every 8 (eight) hours. For arthritis pain. 03/19/12  Yes Edd Arbour, MD  aspirin 81 MG tablet Take 81 mg by  mouth daily.   Yes Historical Provider, MD  atorvastatin (LIPITOR) 10 MG tablet Take 10 mg by mouth daily.  03/01/14  Yes Historical Provider, MD  calcium carbonate (OS-CAL) 600 MG TABS tablet Take 600 mg by mouth 2 (two) times daily with a meal.   Yes Historical Provider, MD  ciprofloxacin (CIPRO) 250 MG tablet Take 250 mg by mouth 2 (two) times daily. Starting 05/25/14 for 14 days   Yes Historical Provider, MD  dextromethorphan-guaiFENesin (MUCINEX DM) 30-600 MG per 12 hr tablet Take 1 tablet by mouth 2 (two) times daily.   Yes Historical Provider, MD  furosemide (LASIX) 20 MG tablet Take 20 mg by mouth every other day.   Yes Historical Provider, MD  meloxicam (MOBIC) 7.5 MG tablet Take 7.5 mg by mouth daily as needed for pain.   Yes Historical Provider, MD  metoprolol (LOPRESSOR) 100 MG tablet Take 1 tablet (100 mg total) by mouth 2 (two) times daily. 09/14/13  Yes Wenda LowJames Joyner, MD   polyethylene glycol [facility](MIRALAX / GLYCOLAX) packet Take 17 g by mouth daily.   Yes Historical Provider, MD  traZODone (DESYREL) 50 MG tablet Take 50 mg by mouth at bedtime.  03/21/14  Yes Historical Provider, MD  valsartan (DIOVAN) 40 MG tablet Take 40 mg by mouth every 12 (twelve) hours.   Yes Historical Provider, MD  vitamin B-12 (CYANOCOBALAMIN) 100 MCG tablet Take 100 mcg by mouth daily.   Yes Historical Provider, MD  cephALEXin (KEFLEX) 500 MG capsule Take 1 capsule (500 mg total) by mouth 4 (four) times daily. 05/27/14   Flint MelterElliott L Krystofer Hevener, MD   BP 108/67  Pulse 185  Temp(Src) 98 F (36.7 C) (Oral)  Resp 12  SpO2 94% Physical Exam  Nursing note and vitals reviewed. Constitutional: She appears well-developed.  Elderly, frail  HENT:  Head: Normocephalic and atraumatic.  Eyes: Conjunctivae and EOM are normal. Pupils are equal, round, and reactive to light.  Neck: Normal range of motion and phonation normal. Neck supple.  Cardiovascular: Normal rate, regular rhythm and intact distal pulses.   Pulmonary/Chest: Effort normal and breath sounds normal. She exhibits no tenderness.  Abdominal: Soft. She exhibits no distension. There is no tenderness. There is no guarding.  Musculoskeletal: Normal range of motion.  Neurological: She is alert. No cranial nerve deficit. She exhibits normal muscle tone.  Skin: Skin is warm and dry.  Psychiatric:  Confused, agitated. She follows commands    ED Course  Procedures (including critical care time)  Medications  0.9 %  sodium chloride infusion ( Intravenous New Bag/Given 05/27/14 1536)  sodium chloride 0.9 % bolus 500 mL (0 mLs Intravenous Stopped 05/27/14 1425)  cephALEXin (KEFLEX) capsule 500 mg (500 mg Oral Given 05/27/14 1536)    Patient Vitals for the past 24 hrs:  BP Temp Temp src Pulse Resp SpO2  05/27/14 1400 108/67 mmHg - - - 12 -  05/27/14 1345 97/81 mmHg - - - 18 94 %  05/27/14 1330 111/64 mmHg - - - 18 -  05/27/14 1315 117/89 mmHg - - -  20 -  05/27/14 1310 96/54 mmHg - - - 10 97 %  05/27/14 1300 117/100 mmHg - - - 24 100 %  05/27/14 1253 - 98 F (36.7 C) Oral 185 22 100 %    3:45 PM Reevaluation with update and discussion. After initial assessment and treatment, an updated evaluation reveals patient's friend, who is her emergency contact, arrived. She states that the patient is, somewhat more agitated than  usual, but is at her baseline relative to confusion. The patient is a full code. She does not have an of life directives. She does not have any healthcare power of attorney. She has not had any more episodes of SVT. Flint Melter    Labs Review Labs Reviewed  CBC WITH DIFFERENTIAL - Abnormal; Notable for the following:    Hemoglobin 9.8 (*)    HCT 31.4 (*)    MCH 24.6 (*)    Lymphocytes Relative 11 (*)    Monocytes Relative 14 (*)    Monocytes Absolute 1.2 (*)    All other components within normal limits  URINALYSIS, ROUTINE W REFLEX MICROSCOPIC - Abnormal; Notable for the following:    APPearance CLOUDY (*)    Leukocytes, UA SMALL (*)    All other components within normal limits  URINE MICROSCOPIC-ADD ON - Abnormal; Notable for the following:    Bacteria, UA MANY (*)    Casts GRANULAR CAST (*)    All other components within normal limits  I-STAT CHEM 8, ED - Abnormal; Notable for the following:    BUN 30 (*)    Creatinine, Ser 1.20 (*)    Glucose, Bld 114 (*)    Calcium, Ion 1.31 (*)    All other components within normal limits  URINE CULTURE  PROTIME-INR  PRO B NATRIURETIC PEPTIDE  I-STAT TROPOININ, ED    Imaging Review Dg Chest Port 1 View  05/27/2014   CLINICAL DATA:  Increased HR  EXAM: PORTABLE CHEST - 1 VIEW  COMPARISON:  Portal chest radiograph dated 03/04/2014  FINDINGS: Low lung volumes. Cardiac silhouette within normal limits. Atherosclerotic calcification in the aorta. Stable scarring right lower lobe. Otherwise no focal reason consolidation or focal infiltrates. Stable thoracic scoliosis.  Chronic advanced osteoarthritic changes within the shoulders as well as findings consistent with bilateral rotator cuff injury.  IMPRESSION: No acute cardiopulmonary disease.  Stable scarring right lung base.   Electronically Signed   By: Salome Holmes M.D.   On: 05/27/2014 13:29     EKG Interpretation   Date/Time:  Friday May 27 2014 12:57:06 EDT Ventricular Rate:  189 PR Interval:  180 QRS Duration: 65 QT Interval:  261 QTC Calculation: 463 R Axis:   60 Text Interpretation:  Supraventricular tachycardia Probable LVH with  secondary repol abnrm ST depression, probably rate related Since last  tracing SVT is new Confirmed by Jeyli Zwicker  MD, Laurent Cargile ([id number]) on 05/27/2014  3:58:36 PM      MDM   Final diagnoses:  UTI (lower urinary tract infection)    Confusion, apparently at baseline. Agitation related to dementia. Urinary tract infection that may have caused some additional confusion. Her cardiac situation stabilized after getting IV fluids. There was no persistent, SVT, or apparent acute coronary syndrome.  Nursing Notes Reviewed/ Care Coordinated Applicable Imaging Reviewed Interpretation of Laboratory Data incorporated into ED treatment  The patient appears reasonably screened and/or stabilized for discharge and I doubt any other medical condition or other [facility] requiring further screening, evaluation, or treatment in the ED at this time prior to discharge.  Plan: Home Medications- Keflex; Home Treatments- rest; return here if the recommended treatment, does not improve the symptoms; Recommended follow up- PCP 1 week    Flint Melter, MD 05/29/14 [phone]

## 2014-05-27 NOTE — Discharge Instructions (Signed)
Urinary Tract Infection  Urinary tract infections (UTIs) can develop anywhere along your urinary tract. Your urinary tract is your body's drainage system for removing wastes and extra water. Your urinary tract includes two kidneys, two ureters, a bladder, and a urethra. Your kidneys are a pair of bean-shaped organs. Each kidney is about the size of your fist. They are located below your ribs, one on each side of your spine.  CAUSES  Infections are caused by microbes, which are microscopic organisms, including fungi, viruses, and bacteria. These organisms are so small that they can only be seen through a microscope. Bacteria are the microbes that most commonly cause UTIs.  SYMPTOMS   Symptoms of UTIs may vary by age and gender of the patient and by the location of the infection. Symptoms in young women typically include a frequent and intense urge to urinate and a painful, burning feeling in the bladder or urethra during urination. Older women and men are more likely to be tired, shaky, and weak and have muscle aches and abdominal pain. A fever may mean the infection is in your kidneys. Other symptoms of a kidney infection include pain in your back or sides below the ribs, nausea, and vomiting.  DIAGNOSIS  To diagnose a UTI, your caregiver will ask you about your symptoms. Your caregiver also will ask to provide a urine sample. The urine sample will be tested for bacteria and white blood cells. White blood cells are made by your body to help fight infection.  TREATMENT   Typically, UTIs can be treated with medication. Because most UTIs are caused by a bacterial infection, they usually can be treated with the use of antibiotics. The choice of antibiotic and length of treatment depend on your symptoms and the type of bacteria causing your infection.  HOME CARE INSTRUCTIONS   If you were prescribed antibiotics, take them exactly as your caregiver instructs you. Finish the medication even if you feel better after you  have only taken some of the medication.   Drink enough water and fluids to keep your urine clear or pale yellow.   Avoid caffeine, tea, and carbonated beverages. They tend to irritate your bladder.   Empty your bladder often. Avoid holding urine for long periods of time.   Empty your bladder before and after sexual intercourse.   After a bowel movement, women should cleanse from front to back. Use each tissue only once.  SEEK MEDICAL CARE IF:    You have back pain.   You develop a fever.   Your symptoms do not begin to resolve within 3 days.  SEEK IMMEDIATE MEDICAL CARE IF:    You have severe back pain or lower abdominal pain.   You develop chills.   You have nausea or vomiting.   You have continued burning or discomfort with urination.  MAKE SURE YOU:    Understand these instructions.   Will watch your condition.   Will get help right away if you are not doing well or get worse.  Document Released: 09/18/2005 Document Revised: 06/09/2012 Document Reviewed: 01/17/2012  Exit[employer] Patient Information 2014 [employer].

## 2014-05-29 LAB — URINE CULTURE

## 2014-05-30 NOTE — Progress Notes (Signed)
ED Antimicrobial Stewardship Positive Culture Follow Up   [name] is an 80 y.o. female who presented to [facility] on 05/27/2014 with a chief complaint of  Chief Complaint  Patient presents with  . Chest Pain    Recent Results (from the past 720 hour(s))  URINE CULTURE     Status: None   Collection Time    05/27/14  2:20 PM      Result Value Ref Range Status   Specimen Description URINE, CATHETERIZED   Final   Special Requests NONE   Final   Culture  Setup Time     Final   Value: 05/27/2014 19:05     Performed at [facility] Count     Final   Value: >=100,000 COLONIES/ML     Performed at [facility]   Culture     Final   Value: ESCHERICHIA COLI     Note: Confirmed Extended Spectrum Beta-Lactamase Producer (ESBL)     Performed at [facility]   Report Status 05/29/2014 FINAL   Final   Organism ID, Bacteria ESCHERICHIA COLI   Final    [x]  Treated with Keflex and cipro, organism resistant to prescribed antimicrobial 80 yo who came in with confusion. She was dc on cipro and keflex for UTI. Culture has come back with ESBL. It's sens to septra.   New antibiotic prescription:   Dc cipro/keflex Start Septra DS 1 PO qday x 7 days  ED Provider: Jaynie Crumble, PA   Ulyses Southward, PharmD Pager: [phone] Infectious Diseases Pharmacist Phone# [phone]

## 2014-05-31 ENCOUNTER — Telehealth (HOSPITAL_BASED_OUTPATIENT_CLINIC_OR_DEPARTMENT_OTHER): Payer: Self-pay | Admitting: Emergency Medicine

## 2014-05-31 NOTE — Telephone Encounter (Signed)
Post ED Visit - Positive Culture Follow-up: Successful Patient Follow-Up  Culture assessed and recommendations reviewed by: []  Wes Dulaney, Pharm.D., BCPS []  Celedonio Miyamoto, Pharm.D., BCPS []  Georgina Pillion, Pharm.D., BCPS [x]  [place], [address].D., BCPS, AAHIVP []  Estella Husk, Pharm.D., BCPS, AAHIVP  Positive urine culture  []  Patient discharged without antimicrobial prescription and treatment is now indicated [x]  Organism is resistant to prescribed ED discharge antimicrobial []  Patient with positive blood cultures  Changes discussed with ED provider: Jaynie Crumble PA-C New antibiotic prescription: Septra DS 1 PO daily x 7 days    [facility] 05/31/2014, 1:19 PM

## 2014-06-10 ENCOUNTER — Ambulatory Visit: Payer: Medicare Other | Admitting: Cardiovascular Disease

## 2014-07-15 ENCOUNTER — Other Ambulatory Visit: Payer: Self-pay | Admitting: *Deleted

## 2014-07-15 MED ORDER — LORAZEPAM 0.5 MG PO TABS: ORAL_TABLET | ORAL | Status: DC

## 2014-07-15 NOTE — Telephone Encounter (Signed)
[facility]

## 2014-07-18 ENCOUNTER — Encounter: Payer: Self-pay | Admitting: Internal Medicine

## 2014-07-18 ENCOUNTER — Non-Acute Institutional Stay (SKILLED_NURSING_FACILITY): Payer: Medicare Other | Admitting: Internal Medicine

## 2014-07-18 DIAGNOSIS — I10 Essential (primary) hypertension: Secondary | ICD-10-CM

## 2014-07-18 DIAGNOSIS — F01518 Vascular dementia, unspecified severity, with other behavioral disturbance: Secondary | ICD-10-CM | POA: Insufficient documentation

## 2014-07-18 DIAGNOSIS — I5032 Chronic diastolic (congestive) heart failure: Secondary | ICD-10-CM

## 2014-07-18 DIAGNOSIS — E119 Type 2 diabetes mellitus without complications: Secondary | ICD-10-CM

## 2014-07-18 DIAGNOSIS — M129 Arthropathy, unspecified: Secondary | ICD-10-CM

## 2014-07-18 DIAGNOSIS — F0151 Vascular dementia with behavioral disturbance: Secondary | ICD-10-CM | POA: Insufficient documentation

## 2014-07-18 DIAGNOSIS — F0390 Unspecified dementia without behavioral disturbance: Secondary | ICD-10-CM

## 2014-07-18 DIAGNOSIS — E785 Hyperlipidemia, unspecified: Secondary | ICD-10-CM

## 2014-07-18 DIAGNOSIS — I509 Heart failure, unspecified: Secondary | ICD-10-CM

## 2014-07-18 NOTE — Progress Notes (Signed)
MRN: [id number] Name: [name]  Sex: female Age: 80 y.o. DOB: 09/20/1920  PSC #: [facility] Facility/Room: 127a Level Of Care: SNF Provider: Merrilee Seashore D Emergency Contacts: Extended Emergency Contact Information Primary Emergency Contact: Brown,[name] [address] [place] Home Phone: [phone] Work Phone: [phone] Relation: Other  Code Status:FULL   Allergies: Codeine and Penicillins  Chief Complaint  Patient presents with  . nursing home admission    HPI: Patient is 80 y.o. female who has been transferred here from another SNF as a resident.  Past Medical History  Diagnosis Date  . CVA (cerebral vascular accident)     a. She's not sure that this is true.  [name] CAD (coronary artery disease)     a. reports prior h/o MI's with possible cath @ [facility] in [place] 'many years ago.'  . Vitamin D deficiency   . Diabetes mellitus, type 2   . HTN (hypertension)   . HLD (hyperlipidemia)   . Tremor, essential   . Urinary incontinence   . High risk social situations 11/28/2011  . Intention tremor 03/03/2007    Qualifier: Diagnosis of  By: Dorathy Daft  MD, IDAYLIS    . Syncope, near 02/28/2012  . Altered mental status 01/01/2011    Qualifier: Diagnosis of  By: Earnest Bailey MD, Selena Batten    . DEFICIENCY, VITAMIN D NOS 07/16/2007    Qualifier: Diagnosis of  By: Irving Burton MD, Clifton Custard    . Asthma   . History of pneumonia   . Recurrent upper respiratory infection (URI)   . Hyperthyroidism   . Anemia   . CKD (chronic kidney disease), stage II   . H/O hiatal hernia   . Seizures   . Headache(784.0)   . Neuromuscular disorder   . Arthritis   . Fibromyalgia   . CAROTID ARTERY DISEASE 07/16/2007    a. 07/2012 U/S: no signif extracranial carotid artery stenosis, antegrad vertebral flow.  . Diastolic CHF, chronic     a. 07/2013 Echo: EF 55-60%.  . TOBACCO USER   . Palpitations     a. 8.2014 Echo: EF 55-60%, no rwma, mod LVH, mildly dil LA, PASP .   [name] Systolic murmur     a. no significant valvular abnormalities on echo 8.2014.    Past Surgical History  Procedure Laterality Date  . Appendectomy  1965  . Vaginal hysterectomy  1965  . Knee surgery  1989, 1995      Medication List       This list is accurate as of: 07/18/14  9:16 PM.  Always use your most recent med list.               acetaminophen 650 MG CR tablet  Commonly known as:  TYLENOL ARTHRITIS PAIN  Take 1 tablet (650 mg total) by mouth every 8 (eight) hours. For arthritis pain.     aspirin 81 MG tablet  Take 81 mg by mouth daily.     atorvastatin 10 MG tablet  Commonly known as:  LIPITOR  Take 10 mg by mouth daily.     calcium carbonate 600 MG Tabs tablet  Commonly known as:  OS-CAL  Take 600 mg by mouth 2 (two) times daily with a meal.     dextromethorphan-guaiFENesin 30-600 MG per 12 hr tablet  Commonly known as:  MUCINEX DM  Take 1 tablet by mouth 2 (two) times daily.     furosemide 20 MG tablet  Commonly known as:  LASIX  Take 20 mg by mouth every other day.     LORazepam 0.5 MG tablet  Commonly known as:  ATIVAN  Take one tablet by mouth four times daily     meloxicam 7.5 MG tablet  Commonly known as:  MOBIC  Take 7.5 mg by mouth daily as needed for pain.     metoprolol 100 MG tablet  Commonly known as:  LOPRESSOR  Take 1 tablet (100 mg total) by mouth 2 (two) times daily.     NAMENDA XR 14 MG Cp24  Generic drug:  Memantine HCl ER  Take 1 tablet by mouth daily.     polyethylene glycol packet  Commonly known as:  MIRALAX / GLYCOLAX  Take 17 g by mouth daily.     traZODone 50 MG tablet  Commonly known as:  DESYREL  Take 50 mg by mouth at bedtime.     valsartan 40 MG tablet  Commonly known as:  DIOVAN  Take 40 mg by mouth every 12 (twelve) hours.        Meds ordered this encounter  Medications  . Memantine HCl ER (NAMENDA XR) 14 MG CP24    Sig: Take 1 tablet by mouth daily.    Immunization History  Administered Date(s)  Administered  . Pneumococcal Polysaccharide-23 09/22/1994  . Td 05/23/2004    History  Substance Use Topics  . Smoking status: Current Some Day Smoker -- 0.50 packs/day    Types: Cigarettes    Last Attempt to Quit: 04/06/2012  . Smokeless tobacco: Never Used     Comment: does not smoke much...  . Alcohol Use: No    Family history is noncontributory    Review of Systems  DATA OBTAINED: from patient; denies all, min conversant GENERAL: Feels well no fevers, fatigue, appetite changes SKIN: No itching, rash  EYES: No eye pain, redness, discharge EARS: No earache, tinnitus, change in hearing NOSE: No congestion, drainage or bleeding  MOUTH/THROAT: No mouth or tooth pain, No sore throat RESPIRATORY: No cough, wheezing, SOB CARDIAC: No chest pain, palpitations, lower extremity edema  GI: No abdominal pain, No N/V/D or constipation, No heartburn or reflux  GU: No dysuria, frequency or urgency, or incontinence  MUSCULOSKELETAL: No unrelieved bone/joint pain NEUROLOGIC: No headache, dizziness  PSYCHIATRIC: No overt anxiety or sadness   Filed Vitals:   07/18/14 1458  BP: 107/70  Pulse: 69  Temp: 98.3 F (36.8 C)  Resp: 20    Physical Exam  GENERAL APPEARANCE: Alert, minconversant. Appropriately groomed. No acute distress.  SKIN: No diaphoresis rash HEAD: Normocephalic, atraumatic  EYES: Conjunctiva/lids clear. Pupils round, reactive. EOMs intact.  EARS: External exam WNL, canals clear. Hearing grossly normal.  NOSE: No deformity or discharge.  MOUTH/THROAT: Lips w/o lesions.  RESPIRATORY: Breathing is even, unlabored. Lung sounds are clear   CARDIOVASCULAR: Heart RRR no murmurs, rubs or gallops. No peripheral edema.   GASTROINTESTINAL: Abdomen is soft, non-tender, not distended w/ normal bowel sounds GENITOURINARY: Bladder non tender, not distended  MUSCULOSKELETAL: No abnormal joints or musculature NEUROLOGIC: . Cranial nerves 2-12 grossly intact. Moves all extremities   PSYCHIATRIC:  no behavioral issues  Patient Active Problem List   Diagnosis Date Noted  . Dementia without behavioral disturbance 07/18/2014  . Palpitations 08/17/2013  . Chest discomfort 08/17/2013  . Trigeminy 08/17/2013  . Toenail deformity 04/30/2013  . Memory loss 09/24/2012  . Insomnia 03/12/2012  . Mobility impaired 01/07/2012  . Diastolic CHF, chronic 12/31/2011  . CAD (  coronary artery disease) 12/28/2011  . TOBACCO USER 12/25/2009  . Abnormality of gait 07/20/2009  . OSTEOPOROSIS 10/01/2007  . HYPERTENSION, ESSENTIAL NOS 08/07/2007  . DIABETES-TYPE 2 07/16/2007  . CAROTID ARTERY DISEASE 07/16/2007  . C V A/STROKE 07/16/2007  . URINARY INCONTINENCE, URGE 07/16/2007  . HX, PERSONAL, THROMBOPHLEBITIS 07/16/2007  . HYPERLIPIDEMIA 03/03/2007  . ARTHROPATHY NEC, SHOULDER 03/03/2007  . ARTHRITIS, KNEES, BILATERAL 03/03/1979    CBC    Component Value Date/Time   WBC 8.8 05/27/2014 1311   RBC 3.99 05/27/2014 1311   HGB 12.2 05/27/2014 1335   HCT 36.0 05/27/2014 1335   PLT 384 05/27/2014 1311   MCV 78.7 05/27/2014 1311   LYMPHSABS 1.0 05/27/2014 1311   MONOABS 1.2* 05/27/2014 1311   EOSABS 0.2 05/27/2014 1311   BASOSABS 0.1 05/27/2014 1311    CMP     Component Value Date/Time   NA 146 05/27/2014 1335   K 4.3 05/27/2014 1335   CL 103 05/27/2014 1335   CO2 26 03/04/2014 2324   GLUCOSE 114* 05/27/2014 1335   BUN 30* 05/27/2014 1335   CREATININE 1.20* 05/27/2014 1335   CREATININE 0.86 04/29/2013 1605   CALCIUM 10.6* 03/04/2014 2324   PROT 7.3 12/03/2013 1952   ALBUMIN 3.3* 12/03/2013 1952   AST 18 12/03/2013 1952   ALT 15 12/03/2013 1952   ALKPHOS 95 12/03/2013 1952   BILITOT 0.1* 12/03/2013 1952   GFRNONAA 50* 03/04/2014 2324   GFRAA 57* 03/04/2014 2324    Assessment and Plan  Diastolic CHF, chronic Reported no evidence of heart failure. Pt on lopressor and lasix  HYPERTENSION, ESSENTIAL NOS Pt on lopressor,valsartan and lasix.These are big doses meds, doubt BP would be acceptable  without some of them at least  DIABETES-TYPE 2 A1c a year ago was 6.4 on no meds;pt on lipitor and ARb  ARTHRITIS, KNEES, BILATERAL Uses tylenol and Mobic  HYPERLIPIDEMIA Lipitor 10 mg to continue  Dementia without behavioral disturbance Continue Gari Crownnamenda    Paddy Walthall D, MD

## 2014-07-18 NOTE — Assessment & Plan Note (Signed)
Reported no evidence of heart failure. Pt on lopressor and lasix

## 2014-07-18 NOTE — Assessment & Plan Note (Signed)
Uses tylenol and Mobic

## 2014-07-18 NOTE — Assessment & Plan Note (Signed)
Lipitor 10 mg to continue

## 2014-07-18 NOTE — Assessment & Plan Note (Signed)
Continue namenda 

## 2014-07-18 NOTE — Assessment & Plan Note (Signed)
Pt on lopressor,valsartan and lasix.These are big doses meds, doubt BP would be acceptable without some of them at least

## 2014-07-18 NOTE — Assessment & Plan Note (Signed)
A1c a year ago was 6.4 on no meds;pt on lipitor and ARb

## 2014-08-22 ENCOUNTER — Other Ambulatory Visit: Payer: Self-pay | Admitting: *Deleted

## 2014-08-22 MED ORDER — LORAZEPAM 0.5 MG PO TABS: ORAL_TABLET | ORAL | Status: DC

## 2014-08-22 NOTE — Telephone Encounter (Signed)
[facility] 

## 2014-08-23 ENCOUNTER — Other Ambulatory Visit: Payer: Self-pay | Admitting: *Deleted

## 2014-08-23 MED ORDER — LORAZEPAM 0.5 MG PO TABS: ORAL_TABLET | ORAL | Status: DC

## 2014-08-23 NOTE — Telephone Encounter (Signed)
[facility] 

## 2014-09-16 ENCOUNTER — Non-Acute Institutional Stay (SKILLED_NURSING_FACILITY): Payer: Medicare Other | Admitting: Nurse Practitioner

## 2014-09-16 DIAGNOSIS — I2510 Atherosclerotic heart disease of native coronary artery without angina pectoris: Secondary | ICD-10-CM

## 2014-09-16 DIAGNOSIS — E119 Type 2 diabetes mellitus without complications: Secondary | ICD-10-CM

## 2014-09-16 DIAGNOSIS — I509 Heart failure, unspecified: Secondary | ICD-10-CM

## 2014-09-16 DIAGNOSIS — E785 Hyperlipidemia, unspecified: Secondary | ICD-10-CM

## 2014-09-16 DIAGNOSIS — I10 Essential (primary) hypertension: Secondary | ICD-10-CM

## 2014-09-16 DIAGNOSIS — F0390 Unspecified dementia without behavioral disturbance: Secondary | ICD-10-CM

## 2014-09-16 DIAGNOSIS — M129 Arthropathy, unspecified: Secondary | ICD-10-CM

## 2014-09-16 DIAGNOSIS — I5032 Chronic diastolic (congestive) heart failure: Secondary | ICD-10-CM

## 2014-09-16 NOTE — Progress Notes (Signed)
Patient ID: [name], female   DOB: 1920/02/20, 80 y.o.   MRN: [id number]    Location: [facility]   Place of Service: SNF (31)    Allergies  Allergen Reactions  . Codeine Other (See Comments)    Pt states that it knocks her out.   [name] Penicillins Rash    Hives, upset stomach     Chief Complaint  Patient presents with  . Medical Management of Chronic Issues    HPI:  Patient is 80 y.o. female who has been transferred to [facility] from another SNF and is now a LTR. Pt with a pmh of hyperlipidemia, anemia, DM, CAD, CHF, htn and dementia. Pt has been stable since routine visit without any acute issues.   Medications: Patient's Medications  New Prescriptions   No medications on file  Previous Medications   ACETAMINOPHEN (TYLENOL ARTHRITIS PAIN) 650 MG CR TABLET    Take 1 tablet (650 mg total) by mouth every 8 (eight) hours. For arthritis pain.   ASPIRIN 81 MG TABLET    Take 81 mg by mouth daily.   ATORVASTATIN (LIPITOR) 10 MG TABLET    Take 10 mg by mouth daily.    CALCIUM CARBONATE (OS-CAL) 600 MG TABS TABLET    Take 600 mg by mouth 2 (two) times daily with a meal.   DEXTROMETHORPHAN-GUAIFENESIN (MUCINEX DM) 30-600 MG PER 12 HR TABLET    Take 1 tablet by mouth 2 (two) times daily.   FUROSEMIDE (LASIX) 20 MG TABLET    Take 20 mg by mouth every other day.   LORAZEPAM (ATIVAN) 0.5 MG TABLET    Take one tablet by mouth four times daily   MELOXICAM (MOBIC) 7.5 MG TABLET    Take 7.5 mg by mouth daily as needed for pain.   MEMANTINE HCL ER (NAMENDA XR) 14 MG CP24    Take 1 tablet by mouth daily.   METOPROLOL (LOPRESSOR) 100 MG TABLET    Take 1 tablet (100 mg total) by mouth 2 (two) times daily.   POLYETHYLENE GLYCOL (MIRALAX / GLYCOLAX) PACKET    Take 17 g by mouth daily.   TRAZODONE (DESYREL) 50 MG TABLET    Take 50 mg by mouth at bedtime.    VALSARTAN (DIOVAN) 40 MG TABLET    Take 40 mg by mouth every 12 (twelve) hours.  Modified Medications   No medications  on file  Discontinued Medications   No medications on file     Review of Systems  Constitutional: Negative for activity change, appetite change, fatigue and unexpected weight change.  HENT: Negative for congestion and hearing loss.   Eyes: Negative.   Respiratory: Negative for cough and shortness of breath.   Cardiovascular: Negative for chest pain, palpitations and leg swelling.  Gastrointestinal: Negative for abdominal pain, diarrhea and constipation.  Genitourinary: Negative for dysuria and difficulty urinating.  Musculoskeletal: Positive for arthralgias (at times). Negative for myalgias.  Skin: Negative for color change and wound.  Neurological: Negative for dizziness and weakness.  Psychiatric/Behavioral: Negative for behavioral problems, confusion and agitation.    Filed Vitals:   09/16/14 1505  BP: 153/68  Pulse: 76  Temp: 97.9 F (36.6 C)  Resp: 20  Weight: 118 lb (53.524 kg)   Body mass index is 20.91 kg/(m^2).  Physical Exam  Constitutional: She appears well-developed and well-nourished. No distress.  HENT:  Head: Normocephalic and atraumatic.  Mouth/Throat: Oropharynx is clear and moist. No oropharyngeal exudate.  Eyes: Conjunctivae are normal.  Pupils are equal, round, and reactive to light.  Neck: Normal range of motion. Neck supple.  Cardiovascular: Normal rate, regular rhythm and normal heart sounds.   Pulmonary/Chest: Effort normal and breath sounds normal.  Abdominal: Soft. Bowel sounds are normal.  Musculoskeletal: She exhibits no edema and no tenderness.  Neurological: She is alert.  STML   Skin: Skin is warm and dry. She is not diaphoretic.  Psychiatric: She has a normal mood and affect.     Labs reviewed: No visits with results within 3 Month(s) from this visit. Latest known visit with results is:  Admission on 05/27/2014, Discharged on 05/27/2014  Component Date Value Ref Range Status  . WBC 05/27/2014 8.8  4.0 - 10.5 K/uL Final  . RBC  05/27/2014 3.99  3.87 - 5.11 MIL/uL Final  . Hemoglobin 05/27/2014 9.8* 12.0 - 15.0 g/dL Final  . HCT 05/27/2014 31.4* 36.0 - 46.0 % Final  . MCV 05/27/2014 78.7  78.0 - 100.0 fL Final  . MCH 05/27/2014 24.6* 26.0 - 34.0 pg Final  . MCHC 05/27/2014 31.2  30.0 - 36.0 g/dL Final  . RDW 05/27/2014 15.0  11.5 - 15.5 % Final  . Platelets 05/27/2014 384  150 - 400 K/uL Final  . Neutrophils Relative % 05/27/2014 72  43 - 77 % Final  . Neutro Abs 05/27/2014 6.4  1.7 - 7.7 K/uL Final  . Lymphocytes Relative 05/27/2014 11* 12 - 46 % Final  . Lymphs Abs 05/27/2014 1.0  0.7 - 4.0 K/uL Final  . Monocytes Relative 05/27/2014 14* 3 - 12 % Final  . Monocytes Absolute 05/27/2014 1.2* 0.1 - 1.0 K/uL Final  . Eosinophils Relative 05/27/2014 2  0 - 5 % Final  . Eosinophils Absolute 05/27/2014 0.2  0.0 - 0.7 K/uL Final  . Basophils Relative 05/27/2014 1  0 - 1 % Final  . Basophils Absolute 05/27/2014 0.1  0.0 - 0.1 K/uL Final  . Prothrombin Time 05/27/2014 13.8  11.6 - 15.2 seconds Final  . INR 05/27/2014 1.08  0.00 - 1.49 Final  . Sodium 05/27/2014 146  137 - 147 mEq/L Final  . Potassium 05/27/2014 4.3  3.7 - 5.3 mEq/L Final  . Chloride 05/27/2014 103  96 - 112 mEq/L Final  . BUN 05/27/2014 30* 6 - 23 mg/dL Final  . Creatinine, Ser 05/27/2014 1.20* 0.50 - 1.10 mg/dL Final  . Glucose, Bld 05/27/2014 114* 70 - 99 mg/dL Final  . Calcium, Ion 05/27/2014 1.31* 1.13 - 1.30 mmol/L Final  . TCO2 05/27/2014 24  0 - 100 mmol/L Final  . Hemoglobin 05/27/2014 12.2  12.0 - 15.0 g/dL Final  . HCT 05/27/2014 36.0  36.0 - 46.0 % Final  . Troponin i, poc 05/27/2014 0.01  0.00 - 0.08 ng/mL Final  . Comment 3 05/27/2014          Final   Comment: Due to the release kinetics of cTnI,                          a negative result within the first hours                          of the onset of symptoms does not rule out                          myocardial infarction with certainty.  If myocardial  infarction is still suspected,                          repeat the test at appropriate intervals.  . Pro B Natriuretic peptide (BNP) 05/27/2014 214.6  0 - 450 pg/mL Final  . Color, Urine 05/27/2014 YELLOW  YELLOW Final  . APPearance 05/27/2014 CLOUDY* CLEAR Final  . Specific Gravity, Urine 05/27/2014 1.025  1.005 - 1.030 Final  . pH 05/27/2014 6.0  5.0 - 8.0 Final  . Glucose, UA 05/27/2014 NEGATIVE  NEGATIVE mg/dL Final  . Hgb urine dipstick 05/27/2014 NEGATIVE  NEGATIVE Final  . Bilirubin Urine 05/27/2014 NEGATIVE  NEGATIVE Final  . Ketones, ur 05/27/2014 NEGATIVE  NEGATIVE mg/dL Final  . Protein, ur 05/27/2014 NEGATIVE  NEGATIVE mg/dL Final  . Urobilinogen, UA 05/27/2014 0.2  0.0 - 1.0 mg/dL Final  . Nitrite 05/27/2014 NEGATIVE  NEGATIVE Final  . Leukocytes, UA 05/27/2014 SMALL* NEGATIVE Final  . Specimen Description 05/27/2014 URINE, CATHETERIZED   Final  . Special Requests 05/27/2014 NONE   Final  . Culture  Setup Time 05/27/2014    Final                   Value:05/27/2014 19:05                         Performed at [facility]  . Colony Count 05/27/2014    Final                   Value:>=100,000 COLONIES/ML                         Performed at [facility]  . Culture 05/27/2014    Final                   Value:ESCHERICHIA COLI                         Note: Confirmed Extended Spectrum Beta-Lactamase Producer (ESBL)                         Performed at [facility]  . Report Status 05/27/2014 05/29/2014 FINAL   Final  . Organism ID, Bacteria 05/27/2014 ESCHERICHIA COLI   Final  . Squamous Epithelial / LPF 05/27/2014 RARE  RARE Final  . WBC, UA 05/27/2014 11-20  <3 WBC/hpf Final  . RBC / HPF 05/27/2014 0-2  <3 RBC/hpf Final  . Bacteria, UA 05/27/2014 MANY* RARE Final  . Casts 05/27/2014 GRANULAR CAST* NEGATIVE Final  CBC with Diff    Result: 07/21/2014 3:33 PM   ( Status: F )       WBC 6.4     4.0-10.5 K/uL SLN   RBC 4.14     3.87-5.11 MIL/uL SLN     Hemoglobin 10.0   L 12.0-15.0 g/dL SLN   Hematocrit 31.5   L 36.0-46.0 % SLN   MCV 76.1   L 78.0-100.0 fL SLN   MCH 24.2   L 26.0-34.0 pg SLN   MCHC 31.7     30.0-36.0 g/dL SLN   RDW 17.7   H 11.5-15.5 % SLN   Platelet Count 347     150-400 K/uL SLN   Granulocyte % 63     43-77 % SLN   Absolute Gran 4.0  1.7-7.7 K/uL SLN   Lymph % 18     12-46 % SLN   Absolute Lymph 1.2     0.7-4.0 K/uL SLN   Mono % 14   H 3-12 % SLN   Absolute Mono 0.9     0.1-1.0 K/uL SLN   Eos % 4     0-5 % SLN   Absolute Eos 0.3     0.0-0.7 K/uL SLN   Baso % 1     0-1 % SLN   Absolute Baso 0.1     0.0-0.1 K/uL SLN   Smear Review Criteria for review not met  SLN   Comprehensive Metabolic Panel    Result: 07/21/2014 4:22 PM   ( Status: F )       Sodium 138     135-145 mEq/L SLN   Potassium 4.8     3.5-5.3 mEq/L SLN   Chloride 105     96-112 mEq/L SLN   CO2 26     19-32 mEq/L SLN   Glucose 103   H 70-99 mg/dL SLN   BUN 33   H 6-23 mg/dL SLN   Creatinine 0.96     0.50-1.10 mg/dL SLN   Bilirubin, Total 0.2     0.2-1.2 mg/dL SLN   Alkaline Phosphatase 68     39-117 U/L SLN   AST/SGOT 15     0-37 U/L SLN   ALT/SGPT 20     0-35 U/L SLN   Total Protein 6.2     6.0-8.3 g/dL SLN   Albumin 3.4   L 3.5-5.2 g/dL SLN   Calcium 8.9     8.4-10.5 mg/dL SLN   Lipid Profile    Result: 07/21/2014 4:22 PM   ( Status: F )       Cholesterol 149     0-200 mg/dL SLN C Triglyceride 51     <150 mg/dL SLN   HDL Cholesterol 57     >39 mg/dL SLN   Total Chol/HDL Ratio 2.6      Ratio SLN   VLDL Cholesterol (Calc) 10     0-40 mg/dL SLN   LDL Cholesterol (Calc) 82     0-99 mg/dL SLN C Hemoglobin A1C    Result: 07/21/2014 4:35 PM   ( Status: F )       Hemoglobin A1C 7.2   H <5.7 % SLN C Estimated Average Glucose 160   H <117 mg/dL SLN    Assessment/Plan 1. HYPERTENSION, ESSENTIAL NOS Patients blood pressure is stable; continue current regimen. Will monitor and make changes as necessary.  2. Coronary artery disease involving  native coronary artery of native heart without angina pectoris Stable, without chest pains, conts on asa  3. Diastolic CHF, chronic -conts on lasix, euvolemic   4. DIABETES-TYPE 2 A1c 7.2. Not currently on medications will cont to monitor   5. Dementia without behavioral disturbance - stable, there has been no significant changes in cognitive or functional status   6. HYPERLIPIDEMIA LDL at goal   7. ARTHRITIS, KNEES, BILATERAL Stable, conts on mobic

## 2014-10-06 ENCOUNTER — Encounter: Payer: Self-pay | Admitting: Internal Medicine

## 2014-10-06 ENCOUNTER — Non-Acute Institutional Stay (SKILLED_NURSING_FACILITY): Payer: Medicare Other | Admitting: Internal Medicine

## 2014-10-06 DIAGNOSIS — E119 Type 2 diabetes mellitus without complications: Secondary | ICD-10-CM

## 2014-10-06 DIAGNOSIS — I5032 Chronic diastolic (congestive) heart failure: Secondary | ICD-10-CM

## 2014-10-06 DIAGNOSIS — I10 Essential (primary) hypertension: Secondary | ICD-10-CM

## 2014-10-06 DIAGNOSIS — E785 Hyperlipidemia, unspecified: Secondary | ICD-10-CM

## 2014-10-06 DIAGNOSIS — M810 Age-related osteoporosis without current pathological fracture: Secondary | ICD-10-CM

## 2014-10-06 DIAGNOSIS — D509 Iron deficiency anemia, unspecified: Secondary | ICD-10-CM

## 2014-10-06 DIAGNOSIS — F0390 Unspecified dementia without behavioral disturbance: Secondary | ICD-10-CM

## 2014-10-06 NOTE — Assessment & Plan Note (Signed)
In 07/21/2014 LDL 82 but HDL 57 and TG 51; good enough profile to continue lipitor 10 mg

## 2014-10-06 NOTE — Assessment & Plan Note (Signed)
A1c in 06/2014 was 7.2 on no meds;on statin and ARB which id fine considering pt's age

## 2014-10-06 NOTE — Assessment & Plan Note (Signed)
Pt without significant decline on namenda;plan continue

## 2014-10-06 NOTE — Assessment & Plan Note (Signed)
In 06/2014 H 10/H 31.5 with MCV 76.1; will start iron daily

## 2014-10-06 NOTE — Assessment & Plan Note (Signed)
Decent control on large doses 3 meds

## 2014-10-06 NOTE — Progress Notes (Signed)
MRN: [id number] Name: Iran Sizerlizabeth C Caver  Sex: female Age: 80 y.o. DOB: [birth year]  [id number] Facility/Room:127A Level Of Care: SNF Provider: Merrilee SeashoreALEXANDER, Josafat Enrico D Emergency Contacts: Extended Emergency Contact Information Primary Emergency Contact: Brown,[name] [address]          [place], [place] Home Phone: [phone] Work Phone: [phone] Relation: Other  Code Status: FULL  Allergies: Codeine and Penicillins  Chief Complaint  Patient presents with  . Medical Management of Chronic Issues    HPI: Patient is 80 y.o. female who is being seen for routine issues.  Past Medical History  Diagnosis Date  . CVA (cerebral vascular accident)     a. She's not sure that this is true.  [name]. CAD (coronary artery disease)     a. reports prior h/o MI's with possible [facility] [place] 'many years ago.'  . Vitamin D deficiency   . Diabetes mellitus, type 2   . HTN (hypertension)   . Tremor, essential   . Urinary incontinence   . High risk social situations 11/28/2011  . Intention tremor 03/03/2007    Qualifier: Diagnosis of  By: Dorathy DaftMORONO-PONCE  MD, IDAYLIS    . Syncope, near 02/28/2012  . Altered mental status 01/01/2011    Qualifier: Diagnosis of  By: Earnest BaileyBriscoe MD, Selena BattenKim    . DEFICIENCY, VITAMIN D NOS 07/16/2007    Qualifier: Diagnosis of  By: Irving BurtonLeininger MD, Clifton CustardAaron    . Asthma   . History of pneumonia   . Recurrent upper respiratory infection (URI)   . Hyperthyroidism   . Anemia   . CKD (chronic kidney disease), stage II   . H/O hiatal hernia   . Seizures   . Headache(784.0)   . Neuromuscular disorder   . Arthritis   . Fibromyalgia   . CAROTID ARTERY DISEASE 07/16/2007    a. 07/2012 U/S: no signif extracranial carotid artery stenosis, antegrad vertebral flow.  . Diastolic CHF, chronic     a. 07/2013 Echo: EF 55-60%.  . TOBACCO USER   . Palpitations     a. 8.2014 Echo: EF 55-60%, no rwma, mod LVH, mildly dil LA, PASP 33mmHg.  [name]. Systolic murmur     a. no  significant valvular abnormalities on echo 8.2014.  [name]. HLD (hyperlipidemia)     Past Surgical History  Procedure Laterality Date  . Appendectomy  1965  . Vaginal hysterectomy  1965  . Knee surgery  1989, 1995      Medication List       This list is accurate as of: 10/06/14  7:41 PM.  Always use your most recent med list.               acetaminophen 650 MG CR tablet  Commonly known as:  TYLENOL ARTHRITIS PAIN  Take 1 tablet (650 mg total) by mouth every 8 (eight) hours. For arthritis pain.     aspirin 81 MG tablet  Take 81 mg by mouth daily.     atorvastatin 10 MG tablet  Commonly known as:  LIPITOR  Take 10 mg by mouth daily.     calcium carbonate 600 MG Tabs tablet  Commonly known as:  OS-CAL  Take 600 mg by mouth 2 (two) times daily with a meal.     dextromethorphan-guaiFENesin 30-600 MG per 12 hr tablet  Commonly known as:  MUCINEX DM  Take 1 tablet by mouth 2 (two) times daily.     furosemide 20 MG tablet  Commonly known as:  LASIX  Take 20 mg by mouth every other day.     LORazepam 0.5 MG tablet  Commonly known as:  ATIVAN  Take one tablet by mouth four times daily     meloxicam 7.5 MG tablet  Commonly known as:  MOBIC  Take 7.5 mg by mouth daily as needed for pain.     metoprolol 100 MG tablet  Commonly known as:  LOPRESSOR  Take 1 tablet (100 mg total) by mouth 2 (two) times daily.     NAMENDA XR 14 MG Cp24  Generic drug:  Memantine HCl ER  Take 1 tablet by mouth daily.     polyethylene glycol packet  Commonly known as:  MIRALAX / GLYCOLAX  Take 17 g by mouth daily.     traZODone 50 MG tablet  Commonly known as:  DESYREL  Take 50 mg by mouth at bedtime.     valsartan 40 MG tablet  Commonly known as:  DIOVAN  Take 40 mg by mouth every 12 (twelve) hours.        No orders of the defined types were placed in this encounter.    Immunization History  Administered Date(s) Administered  . Pneumococcal Polysaccharide-23 09/22/1994  . Td  05/23/2004    History  Substance Use Topics  . Smoking status: Current Some Day Smoker -- 0.50 packs/day    Types: Cigarettes    Last Attempt to Quit: 04/06/2012  . Smokeless tobacco: Never Used     Comment: does not smoke much...  . Alcohol Use: No    Review of Systems  DATA OBTAINED: from patient, nurse, medical record, family member GENERAL:  no fevers, fatigue, appetite changes SKIN: No itching, rash HEENT: No complaint RESPIRATORY: No cough, wheezing, SOB CARDIAC: No chest pain, palpitations, lower extremity edema  GI: No abdominal pain, No N/V/D or constipation, No heartburn or reflux  GU: No dysuria, frequency or urgency, or incontinence  MUSCULOSKELETAL: sometimes arthritis pain NEUROLOGIC: No headache, dizziness  PSYCHIATRIC: No overt anxiety or sadness  Filed Vitals:   10/06/14 1542  BP: 134/64  Pulse: 65  Temp: 97.1 F (36.2 C)  Resp: 20    Physical Exam  GENERAL APPEARANCE: Alert, No acute distress  SKIN: No diaphoresis rash HEENT: Unremarkable RESPIRATORY: Breathing is even, unlabored. Lung sounds are clear   CARDIOVASCULAR: Heart RRR no murmurs, rubs or gallops. No peripheral edema  GASTROINTESTINAL: Abdomen is soft, non-tender, not distended w/ normal bowel sounds.  GENITOURINARY: Bladder non tender, not distended  MUSCULOSKELETAL: No abnormal joints or musculature NEUROLOGIC: Cranial nerves 2-12 grossly intact. Moves all extremities PSYCHIATRIC: Mood and affect appropriate to situation, no behavioral issues  Patient Active Problem List   Diagnosis Date Noted  . Anemia, iron deficiency 10/06/2014  . Dementia without behavioral disturbance 07/18/2014  . Palpitations 08/17/2013  . Chest discomfort 08/17/2013  . Trigeminy 08/17/2013  . Toenail deformity 04/30/2013  . Memory loss 09/24/2012  . Insomnia 03/12/2012  . Mobility impaired 01/07/2012  . Diastolic CHF, chronic 12/31/2011  . CAD (coronary artery disease) 12/28/2011  . TOBACCO USER  12/25/2009  . Abnormality of gait 07/20/2009  . Osteoporosis 10/01/2007  . Essential hypertension 08/07/2007  . Type 2 diabetes mellitus, controlled 07/16/2007  . CAROTID ARTERY DISEASE 07/16/2007  . C V A/STROKE 07/16/2007  . URINARY INCONTINENCE, URGE 07/16/2007  . HX, PERSONAL, THROMBOPHLEBITIS 07/16/2007  . Hyperlipidemia with target LDL less than 70 03/03/2007  . ARTHROPATHY NEC, SHOULDER 03/03/2007  . ARTHRITIS, KNEES, BILATERAL 03/03/1979  CBC    Component Value Date/Time   WBC 8.8 05/27/2014 1311   RBC 3.99 05/27/2014 1311   HGB 12.2 05/27/2014 1335   HCT 36.0 05/27/2014 1335   PLT 384 05/27/2014 1311   MCV 78.7 05/27/2014 1311   LYMPHSABS 1.0 05/27/2014 1311   MONOABS 1.2* 05/27/2014 1311   EOSABS 0.2 05/27/2014 1311   BASOSABS 0.1 05/27/2014 1311    CMP     Component Value Date/Time   NA 146 05/27/2014 1335   K 4.3 05/27/2014 1335   CL 103 05/27/2014 1335   CO2 26 03/04/2014 2324   GLUCOSE 114* 05/27/2014 1335   BUN 30* 05/27/2014 1335   CREATININE 1.20* 05/27/2014 1335   CREATININE 0.86 04/29/2013 1605   CALCIUM 10.6* 03/04/2014 2324   PROT 7.3 12/03/2013 1952   ALBUMIN 3.3* 12/03/2013 1952   AST 18 12/03/2013 1952   ALT 15 12/03/2013 1952   ALKPHOS 95 12/03/2013 1952   BILITOT 0.1* 12/03/2013 1952   GFRNONAA 50* 03/04/2014 2324   GFRAA 57* 03/04/2014 2324    Assessment and Plan  Dementia without behavioral disturbance Pt without significant decline on namenda;plan continue  Type 2 diabetes mellitus, controlled A1c in 06/2014 was 7.2 on no meds;on statin and ARB which id fine considering pt's age  Hyperlipidemia with target LDL less than 70 In 07/21/2014 LDL 82 but HDL 57 and TG 51; good enough profile to continue lipitor 10 mg  Anemia, iron deficiency In 06/2014 H 10/H 31.5 with MCV 76.1; will start iron daily  Osteoporosis Continue daily oscal  Diastolic CHF, chronic Pt on bblocker and lasix;no exacerbations or problems  Essential hypertension Decent control on large  doses 3 meds    Margit HanksALEXANDER, Aime Carreras D, MD

## 2014-10-06 NOTE — Assessment & Plan Note (Signed)
Continue daily oscal

## 2014-10-06 NOTE — Assessment & Plan Note (Signed)
Pt on bblocker and lasix;no exacerbations or problems

## 2014-11-04 ENCOUNTER — Non-Acute Institutional Stay (SKILLED_NURSING_FACILITY): Payer: Medicare Other | Admitting: Nurse Practitioner

## 2014-11-04 DIAGNOSIS — F0390 Unspecified dementia without behavioral disturbance: Secondary | ICD-10-CM

## 2014-11-04 DIAGNOSIS — M1990 Unspecified osteoarthritis, unspecified site: Secondary | ICD-10-CM

## 2014-11-04 DIAGNOSIS — D509 Iron deficiency anemia, unspecified: Secondary | ICD-10-CM

## 2014-11-04 DIAGNOSIS — I5032 Chronic diastolic (congestive) heart failure: Secondary | ICD-10-CM

## 2014-11-04 DIAGNOSIS — G4700 Insomnia, unspecified: Secondary | ICD-10-CM

## 2014-11-04 DIAGNOSIS — I10 Essential (primary) hypertension: Secondary | ICD-10-CM

## 2014-11-04 NOTE — Progress Notes (Signed)
Patient ID: Iran Sizerlizabeth C Wolven, female   DOB: March 20, 1920, 80 y.o.   MRN: [id number]    Nursing Home Location:  [facility]   Place of Service: SNF (31)  PCP: [name], Eric, MD  Allergies  Allergen Reactions  . Codeine Other (See Comments)    Pt states that it knocks her out.   [name]. Penicillins Rash    Hives, upset stomach     Chief Complaint  Patient presents with  . Medical Management of Chronic Issues    HPI:  Patient is a 80 y.o. female seen today at [facility] for routine follow up on chronic conditions.  Pt with a pmh of hyperlipidemia, anemia, DM, CAD, CHF, htn and dementia. Pt has been in her usual state of health in the last month. Pharmacy recs for reducing trazodone to PRN only at qhs Review of Systems:  Review of Systems  Constitutional: Negative for activity change, appetite change, fatigue and unexpected weight change.  HENT: Negative for congestion and hearing loss.   Eyes: Negative.   Respiratory: Negative for cough and shortness of breath.   Cardiovascular: Negative for chest pain, palpitations and leg swelling.  Gastrointestinal: Negative for abdominal pain, diarrhea and constipation.  Genitourinary: Negative for dysuria and difficulty urinating.  Musculoskeletal: Positive for arthralgias (at times). Negative for myalgias.  Skin: Negative for color change and wound.  Neurological: Negative for dizziness and weakness.  Psychiatric/Behavioral: Negative for behavioral problems, confusion and agitation.    Past Medical History  Diagnosis Date  . CVA (cerebral vascular accident)     a. She's not sure that this is true.  [name]. CAD (coronary artery disease)     a. reports prior h/o MI's with possible cath @ [facility] in [place] 'many years ago.'  . Vitamin D deficiency   . Diabetes mellitus, type 2   . HTN (hypertension)   . Tremor, essential   . Urinary incontinence   . High risk social situations 11/28/2011  . Intention tremor  03/03/2007    Qualifier: Diagnosis of  By: Dorathy DaftMORONO-PONCE  MD, IDAYLIS    . Syncope, near 02/28/2012  . Altered mental status 01/01/2011    Qualifier: Diagnosis of  By: Earnest BaileyBriscoe MD, Selena BattenKim    . DEFICIENCY, VITAMIN D NOS 07/16/2007    Qualifier: Diagnosis of  By: Irving BurtonLeininger MD, Clifton CustardAaron    . Asthma   . History of pneumonia   . Recurrent upper respiratory infection (URI)   . Hyperthyroidism   . Anemia   . CKD (chronic kidney disease), stage II   . H/O hiatal hernia   . Seizures   . Headache(784.0)   . Neuromuscular disorder   . Arthritis   . Fibromyalgia   . CAROTID ARTERY DISEASE 07/16/2007    a. 07/2012 U/S: no signif extracranial carotid artery stenosis, antegrad vertebral flow.  . Diastolic CHF, chronic     a. 07/2013 Echo: EF 55-60%.  . TOBACCO USER   . Palpitations     a. 8.2014 Echo: EF 55-60%, no rwma, mod LVH, mildly dil LA, PASP 33mmHg.  [name]. Systolic murmur     a. no significant valvular abnormalities on echo 8.2014.  [name]. HLD (hyperlipidemia)    Past Surgical History  Procedure Laterality Date  . Appendectomy  1965  . Vaginal hysterectomy  1965  . Knee surgery  1989, 1995   Social History:   reports that she has been smoking Cigarettes.  She has been smoking about 0.50 packs per day.  She has never used smokeless tobacco. She reports that she does not drink alcohol or use illicit drugs.  Family History  Problem Relation Age of Onset  . Diabetes Mother   . Diabetes Brother   . Stroke Father     Medications: Patient's Medications  New Prescriptions   No medications on file  Previous Medications   ACETAMINOPHEN (TYLENOL ARTHRITIS PAIN) 650 MG CR TABLET    Take 1 tablet (650 mg total) by mouth every 8 (eight) hours. For arthritis pain.   ASPIRIN 81 MG TABLET    Take 81 mg by mouth daily.   ATORVASTATIN (LIPITOR) 10 MG TABLET    Take 10 mg by mouth daily.    CALCIUM CARBONATE (OS-CAL) 600 MG TABS TABLET    Take 600 mg by mouth 2 (two) times daily with a meal.    DEXTROMETHORPHAN-GUAIFENESIN (MUCINEX DM) 30-600 MG PER 12 HR TABLET    Take 1 tablet by mouth 2 (two) times daily.   FUROSEMIDE (LASIX) 20 MG TABLET    Take 20 mg by mouth every other day.   LORAZEPAM (ATIVAN) 0.5 MG TABLET    Take one tablet by mouth four times daily   MELOXICAM (MOBIC) 7.5 MG TABLET    Take 7.5 mg by mouth daily as needed for pain.   MEMANTINE HCL ER (NAMENDA XR) 14 MG CP24    Take 1 tablet by mouth daily.   METOPROLOL (LOPRESSOR) 100 MG TABLET    Take 1 tablet (100 mg total) by mouth 2 (two) times daily.   POLYETHYLENE GLYCOL (MIRALAX / GLYCOLAX) PACKET    Take 17 g by mouth daily.   TRAZODONE (DESYREL) 50 MG TABLET    Take 50 mg by mouth at bedtime.    VALSARTAN (DIOVAN) 40 MG TABLET    Take 40 mg by mouth every 12 (twelve) hours.  Modified Medications   No medications on file  Discontinued Medications   No medications on file     Physical Exam: Filed Vitals:   11/04/14 1659  BP: 121/68  Pulse: 73  Temp: 97 F (36.1 C)  Resp: 18  Weight: 117 lb (53.071 kg)    Physical Exam  Constitutional: She appears well-developed and well-nourished. No distress.  HENT:  Head: Normocephalic and atraumatic.  Mouth/Throat: Oropharynx is clear and moist. No oropharyngeal exudate.  Eyes: Conjunctivae are normal. Pupils are equal, round, and reactive to light.  Neck: Normal range of motion. Neck supple.  Cardiovascular: Normal rate, regular rhythm and normal heart sounds.   Pulmonary/Chest: Effort normal and breath sounds normal.  Abdominal: Soft. Bowel sounds are normal.  Musculoskeletal: She exhibits no edema or tenderness.  Neurological: She is alert.  STML  Skin: Skin is warm and dry. She is not diaphoretic.  Psychiatric: She has a normal mood and affect.    Labs reviewed: Basic Metabolic Panel:  Recent Labs  91/47/82 1952 03/04/14 2324 05/27/14 1335  NA 145 143 146  K 5.1 4.6 4.3  CL 107 103 103  CO2 28 26  --   GLUCOSE 193* 145* 114*  BUN 26* 29* 30*    CREATININE 0.70 0.97 1.20*  CALCIUM 9.0 10.6*  --    Liver Function Tests:  Recent Labs  12/03/13 1952  AST 18  ALT 15  ALKPHOS 95  BILITOT 0.1*  PROT 7.3  ALBUMIN 3.3*   No results for input(s): LIPASE, AMYLASE in the last 8760 hours. No results for input(s): AMMONIA in the last 8760 hours. CBC:  Recent Labs  12/03/13 1952 03/04/14 2324 05/27/14 1311 05/27/14 1335  WBC 11.9* 9.7 8.8  --   NEUTROABS  --   --  6.4  --   HGB 12.2 12.7 9.8* 12.2  HCT 37.0 38.4 31.4* 36.0  MCV 84.3 80.3 78.7  --   PLT 463* 350 384  --    TSH: No results for input(s): TSH in the last 8760 hours. A1C: Lab Results  Component Value Date   HGBA1C 6.4 04/29/2013   Lipid Panel: No results for input(s): CHOL, HDL, LDLCALC, TRIG, CHOLHDL, LDLDIRECT in the last 8760 hours.   Assessment/Plan 1. Diastolic CHF, chronic Stable on current lasix dose, conts on ASA, lopressor   2. Essential hypertension Remained stable on current medications  3. Dementia without behavioral disturbance conts on namenda, no acute changes in cognitive or functional status in the last month    4. Osteoarthritis, unspecified osteoarthritis type, unspecified site With ongoing joint pains at times, conts on mobic, will follow up bmp  5. Insomnia Will change trazodone to PRN, will start melatonin 3 mg qhs  6. Anemia, iron deficiency -currently on iron and b12 supplements -will follow up CBC, iron panel and B12 level

## 2014-11-29 ENCOUNTER — Non-Acute Institutional Stay (SKILLED_NURSING_FACILITY): Payer: Medicare Other | Admitting: Nurse Practitioner

## 2014-11-29 DIAGNOSIS — D509 Iron deficiency anemia, unspecified: Secondary | ICD-10-CM

## 2014-11-29 DIAGNOSIS — I10 Essential (primary) hypertension: Secondary | ICD-10-CM

## 2014-11-29 DIAGNOSIS — F0390 Unspecified dementia without behavioral disturbance: Secondary | ICD-10-CM

## 2014-11-29 DIAGNOSIS — G4700 Insomnia, unspecified: Secondary | ICD-10-CM

## 2014-11-29 DIAGNOSIS — J189 Pneumonia, unspecified organism: Secondary | ICD-10-CM

## 2014-11-29 NOTE — Progress Notes (Signed)
Patient ID: [name], female   DOB: Apr 13, 1920, 80 y.o.   MRN: [id number]    Nursing Home Location:  [place] of Service: SNF (31)  PCP: Tommi Rumps, MD  Allergies  Allergen Reactions  . Codeine Other (See Comments)    Pt states that it knocks her out.   [name] Penicillins Rash    Hives, upset stomach     Chief Complaint  Patient presents with  . Medical Management of Chronic Issues    HPI:  Patient is a 80 y.o. female seen today at [facility] for routine follow up on chronic conditions.  Pt with a pmh of hyperlipidemia, anemia, DM, CAD, CHF, htn and dementia. Pt with increased cough and congestion over the past few days.  Xray was obtained and it was found she had Pneumonia. She is currently on avelox and tolerating well.  Still has frequent cough. Pt reports she is weak and tired. No recent fevers noted. No shortness of breath.  Review of Systems:  Review of Systems  Constitutional: Positive for fatigue. Negative for activity change, appetite change and unexpected weight change.  HENT: Negative for congestion and hearing loss.   Eyes: Negative.   Respiratory: Positive for cough. Negative for shortness of breath.   Cardiovascular: Negative for chest pain, palpitations and leg swelling.  Gastrointestinal: Negative for abdominal pain, diarrhea and constipation.  Genitourinary: Negative for dysuria and difficulty urinating.  Musculoskeletal: Positive for arthralgias (at times). Negative for myalgias.  Skin: Negative for color change and wound.  Neurological: Negative for dizziness and weakness.  Psychiatric/Behavioral: Negative for behavioral problems, confusion and agitation.    Past Medical History  Diagnosis Date  . CVA (cerebral vascular accident)     a. She's not sure that this is true.  [name] CAD (coronary artery disease)     a. reports prior h/o MI's with possible cath @ [facility] in [place] 'many years ago.'  . Vitamin D  deficiency   . Diabetes mellitus, type 2   . HTN (hypertension)   . Tremor, essential   . Urinary incontinence   . High risk social situations 11/28/2011  . Intention tremor 03/03/2007    Qualifier: Diagnosis of  By: Armandina Gemma  MD, IDAYLIS    . Syncope, near 02/28/2012  . Altered mental status 01/01/2011    Qualifier: Diagnosis of  By: Doreene Nest MD, Maudie Mercury    . DEFICIENCY, VITAMIN D NOS 07/16/2007    Qualifier: Diagnosis of  By: Dorathy Daft MD, Marjory Lies    . Asthma   . History of pneumonia   . Recurrent upper respiratory infection (URI)   . Hyperthyroidism   . Anemia   . CKD (chronic kidney disease), stage II   . H/O hiatal hernia   . Seizures   . Headache(784.0)   . Neuromuscular disorder   . Arthritis   . Fibromyalgia   . CAROTID ARTERY DISEASE 07/16/2007    a. 07/2012 U/S: no signif extracranial carotid artery stenosis, antegrad vertebral flow.  . Diastolic CHF, chronic     a. 07/2013 Echo: EF 55-60%.  . TOBACCO USER   . Palpitations     a. 8.2014 Echo: EF 55-60%, no rwma, mod LVH, mildly dil LA, PASP 49mHg.  .Marland KitchenSystolic murmur     a. no significant valvular abnormalities on echo 8.2014.  .Marland KitchenHLD (hyperlipidemia)    Past Surgical History  Procedure Laterality Date  . Appendectomy  1965  . Vaginal hysterectomy  1965  . Knee surgery  1989, 1995   Social History:   reports that she has been smoking Cigarettes.  She has been smoking about 0.50 packs per day. She has never used smokeless tobacco. She reports that she does not drink alcohol or use illicit drugs.  Family History  Problem Relation Age of Onset  . Diabetes Mother   . Diabetes Brother   . Stroke Father     Medications: Patient's Medications  New Prescriptions   No medications on file  Previous Medications   ACETAMINOPHEN (TYLENOL ARTHRITIS PAIN) 650 MG CR TABLET    Take 1 tablet (650 mg total) by mouth every 8 (eight) hours. For arthritis pain.   ASPIRIN 81 MG TABLET    Take 81 mg by mouth daily.   ATORVASTATIN  (LIPITOR) 10 MG TABLET    Take 10 mg by mouth daily.    CALCIUM CARBONATE (OS-CAL) 600 MG TABS TABLET    Take 600 mg by mouth 2 (two) times daily with a meal.   DEXTROMETHORPHAN-GUAIFENESIN (MUCINEX DM) 30-600 MG PER 12 HR TABLET    Take 1 tablet by mouth 2 (two) times daily.   FERROUS SULFATE 325 (65 FE) MG TABLET    Take 325 mg by mouth daily with breakfast.   FUROSEMIDE (LASIX) 20 MG TABLET    Take 20 mg by mouth every other day.   LORAZEPAM (ATIVAN) 0.5 MG TABLET    Take one tablet by mouth four times daily   MELATONIN 3 MG CAPS    Take 3 mg by mouth at bedtime.   MELOXICAM (MOBIC) 7.5 MG TABLET    Take 7.5 mg by mouth daily as needed for pain.   MEMANTINE HCL ER (NAMENDA XR) 14 MG CP24    Take 1 tablet by mouth daily.   METOPROLOL (LOPRESSOR) 100 MG TABLET    Take 1 tablet (100 mg total) by mouth 2 (two) times daily.   POLYETHYLENE GLYCOL (MIRALAX / GLYCOLAX) PACKET    Take 17 g by mouth daily.   TRAZODONE (DESYREL) 50 MG TABLET    Take 50 mg by mouth at bedtime as needed.    VALSARTAN (DIOVAN) 40 MG TABLET    Take 40 mg by mouth every 12 (twelve) hours.   VITAMIN B-12 (CYANOCOBALAMIN) 1000 MCG TABLET    Take 1,000 mcg by mouth daily.  Modified Medications   No medications on file  Discontinued Medications   No medications on file     Physical Exam: Filed Vitals:   11/29/14 1343  BP: 130/66  Pulse: 86  Temp: 97.4 F (36.3 C)  Resp: 20  Weight: 121 lb (54.885 kg)    Physical Exam  Constitutional: She appears well-developed and well-nourished. No distress.  HENT:  Head: Normocephalic and atraumatic.  Mouth/Throat: Oropharynx is clear and moist. No oropharyngeal exudate.  Eyes: Conjunctivae are normal. Pupils are equal, round, and reactive to light.  Neck: Normal range of motion. Neck supple.  Cardiovascular: Normal rate, regular rhythm and normal heart sounds.   Pulmonary/Chest: Effort normal. She has decreased breath sounds.  Abdominal: Soft. Bowel sounds are normal.    Musculoskeletal: She exhibits no edema or tenderness.  Neurological: She is alert.  STML  Skin: Skin is warm and dry. She is not diaphoretic.  Psychiatric: She has a normal mood and affect.    Labs reviewed: Basic Metabolic Panel:  Recent Labs  12/03/13 1952 03/04/14 2324 05/27/14 1335  NA 145 143 146  K 5.1 4.6 4.3  CL 107 103 103  CO2 28 26  --   GLUCOSE 193* 145* 114*  BUN 26* 29* 30*  CREATININE 0.70 0.97 1.20*  CALCIUM 9.0 10.6*  --    Liver Function Tests:  Recent Labs  12/03/13 1952  AST 18  ALT 15  ALKPHOS 95  BILITOT 0.1*  PROT 7.3  ALBUMIN 3.3*   No results for input(s): LIPASE, AMYLASE in the last 8760 hours. No results for input(s): AMMONIA in the last 8760 hours. CBC:  Recent Labs  12/03/13 1952 03/04/14 2324 05/27/14 1311 05/27/14 1335  WBC 11.9* 9.7 8.8  --   NEUTROABS  --   --  6.4  --   HGB 12.2 12.7 9.8* 12.2  HCT 37.0 38.4 31.4* 36.0  MCV 84.3 80.3 78.7  --   PLT 463* 350 384  --    TSH: No results for input(s): TSH in the last 8760 hours. A1C: Lab Results  Component Value Date   HGBA1C 6.4 04/29/2013   Lipid Panel: No results for input(s): CHOL, HDL, LDLCALC, TRIG, CHOLHDL, LDLDIRECT in the last 8760 hours. Iron and IBC    Result: 11/11/2014 6:08 PM   ( Status: F )       Iron 56     42-145 ug/dL SLN   UIBC 192     125-400 ug/dL SLN   TIBC 248   L 250-470 ug/dL SLN   %SAT 23     20-55 % SLN   CBC with Diff    Result: 11/11/2014 6:16 PM   ( Status: F )       WBC 7.1     4.0-10.5 K/uL SLN   RBC 4.19     3.87-5.11 MIL/uL SLN   Hemoglobin 11.2   L 12.0-15.0 g/dL SLN   Hematocrit 33.4   L 36.0-46.0 % SLN   MCV 79.7     78.0-100.0 fL SLN   MCH 26.7     26.0-34.0 pg SLN   MCHC 33.5     30.0-36.0 g/dL SLN   RDW 19.0   H 11.5-15.5 % SLN   Platelet Count 302     150-400 K/uL SLN   MPV 10.1     9.4-12.4 fL SLN   Granulocyte % 68     43-77 % SLN   Absolute Gran 4.8     1.7-7.7 K/uL SLN   Lymph % 15     12-46 % SLN   Absolute  Lymph 1.1     0.7-4.0 K/uL SLN   Mono % 11     3-12 % SLN   Absolute Mono 0.8     0.1-1.0 K/uL SLN   Eos % 5     0-5 % SLN   Absolute Eos 0.4     0.0-0.7 K/uL SLN   Baso % 1     0-1 % SLN   Absolute Baso 0.1     0.0-0.1 K/uL SLN   Smear Review Criteria for review not met  SLN   Basic Metabolic Panel    Result: 11/11/2014 6:08 PM   ( Status: F )       Sodium 141     135-145 mEq/L SLN   Potassium 4.1     3.5-5.3 mEq/L SLN   Chloride 107     96-112 mEq/L SLN   CO2 26     19-32 mEq/L SLN   Glucose 103   H 70-99 mg/dL SLN   BUN 23     6-23  mg/dL SLN   Creatinine 0.82     0.50-1.10 mg/dL SLN   Calcium 9.2     8.4-10.5 mg/dL SLN   Vitamin B12, Serum    Result: 11/11/2014 11:24 PM   ( Status: F )       Vitamin B12 832     211-911 pg/mL SLN   Assessment/Plan  1. Essential hypertension Patients blood pressure is stable; continue current regimen. Will monitor and make changes as necessary.  2. Dementia without behavioral disturbance Without acute changes in cognitive or functional status ,conts on namenda  3. Anemia, iron deficiency hgb stable, conts on iron and B12  4. Insomnia -conts on melatonin qhs, has trazodone PRN  5. HCAP -conts on avelox for 7 days total Will add mucinex DM BID for 7 days Will start scheduled duonebs TID for 7 days

## 2014-12-27 ENCOUNTER — Non-Acute Institutional Stay (SKILLED_NURSING_FACILITY): Payer: Medicare Other | Admitting: Nurse Practitioner

## 2014-12-27 DIAGNOSIS — D509 Iron deficiency anemia, unspecified: Secondary | ICD-10-CM

## 2014-12-27 DIAGNOSIS — M150 Primary generalized (osteo)arthritis: Secondary | ICD-10-CM | POA: Diagnosis not present

## 2014-12-27 DIAGNOSIS — I10 Essential (primary) hypertension: Secondary | ICD-10-CM

## 2014-12-27 DIAGNOSIS — K5900 Constipation, unspecified: Secondary | ICD-10-CM

## 2014-12-27 DIAGNOSIS — E785 Hyperlipidemia, unspecified: Secondary | ICD-10-CM

## 2014-12-27 DIAGNOSIS — F0390 Unspecified dementia without behavioral disturbance: Secondary | ICD-10-CM

## 2014-12-27 DIAGNOSIS — G4700 Insomnia, unspecified: Secondary | ICD-10-CM | POA: Diagnosis not present

## 2014-12-27 DIAGNOSIS — E119 Type 2 diabetes mellitus without complications: Secondary | ICD-10-CM

## 2014-12-27 DIAGNOSIS — I5032 Chronic diastolic (congestive) heart failure: Secondary | ICD-10-CM | POA: Diagnosis not present

## 2014-12-27 DIAGNOSIS — M159 Polyosteoarthritis, unspecified: Secondary | ICD-10-CM

## 2014-12-27 NOTE — Progress Notes (Signed)
Patient ID: [name], female   DOB: 1920/06/04, 81 y.o.   MRN: [id number]    Nursing Home Location:  [place] of Service: SNF ([id number])  PCP: Tommi Rumps, MD  Allergies  Allergen Reactions  . Codeine Other (See Comments)    Pt states that it knocks her out.   [name] Penicillins Rash    Hives, upset stomach     Chief Complaint  Patient presents with  . Medical Management of Chronic Issues    HPI:  Patient is a 81 y.o. female seen today at [facility] for routine follow up on chronic conditions. Pt with a pmh of hyperlipidemia, anemia, DM, CAD, CHF, htn and dementia. Pt was treated for pneumonia last month. Completed treatment and now back to baseline. Pt reports good appetite but does not like the food at [place]. Reports ongoing pain in her right shoulder, no worsening in pain. Staff has no concerns at this time   Review of Systems:  Review of Systems  Constitutional: Positive for fatigue. Negative for activity change, appetite change and unexpected weight change.  HENT: Negative for congestion and hearing loss.   Eyes: Negative.   Respiratory: Negative for cough and shortness of breath.   Cardiovascular: Negative for chest pain, palpitations and leg swelling.  Gastrointestinal: Negative for abdominal pain, diarrhea and constipation.  Genitourinary: Negative for dysuria and difficulty urinating.  Musculoskeletal: Positive for arthralgias (right shoulder). Negative for myalgias.  Skin: Negative for color change and wound.  Neurological: Negative for dizziness and weakness.  Psychiatric/Behavioral: Negative for behavioral problems, confusion and agitation.    Past Medical History  Diagnosis Date  . CVA (cerebral vascular accident)     a. She's not sure that this is true.  [name] CAD (coronary artery disease)     a. reports prior h/o MI's with possible cath @ [facility] in [place] 'many years ago.'  . Vitamin D deficiency   . Diabetes  mellitus, type 2   . HTN (hypertension)   . Tremor, essential   . Urinary incontinence   . High risk social situations 11/28/2011  . Intention tremor 03/03/2007    Qualifier: Diagnosis of  By: Armandina Gemma  MD, IDAYLIS    . Syncope, near 02/28/2012  . Altered mental status 01/01/2011    Qualifier: Diagnosis of  By: Doreene Nest MD, Maudie Mercury    . DEFICIENCY, VITAMIN D NOS 07/16/2007    Qualifier: Diagnosis of  By: Dorathy Daft MD, Marjory Lies    . Asthma   . History of pneumonia   . Recurrent upper respiratory infection (URI)   . Hyperthyroidism   . Anemia   . CKD (chronic kidney disease), stage II   . H/O hiatal hernia   . Seizures   . Headache(784.0)   . Neuromuscular disorder   . Arthritis   . Fibromyalgia   . CAROTID ARTERY DISEASE 07/16/2007    a. 07/2012 U/S: no signif extracranial carotid artery stenosis, antegrad vertebral flow.  . Diastolic CHF, chronic     a. 07/2013 Echo: EF 55-60%.  . TOBACCO USER   . Palpitations     a. 8.2014 Echo: EF 55-60%, no rwma, mod LVH, mildly dil LA, PASP 69mHg.  .Marland KitchenSystolic murmur     a. no significant valvular abnormalities on echo 8.2014.  .Marland KitchenHLD (hyperlipidemia)    Past Surgical History  Procedure Laterality Date  . Appendectomy  1965  . Vaginal hysterectomy  1965  . Knee surgery  1989,  1995   Social History:   reports that she has been smoking Cigarettes.  She has been smoking about 0.50 packs per day. She has never used smokeless tobacco. She reports that she does not drink alcohol or use illicit drugs.  Family History  Problem Relation Age of Onset  . Diabetes Mother   . Diabetes Brother   . Stroke Father     Medications: Patient's Medications  New Prescriptions   No medications on file  Previous Medications   ACETAMINOPHEN (TYLENOL ARTHRITIS PAIN) 650 MG CR TABLET    Take 1 tablet (650 mg total) by mouth every 8 (eight) hours. For arthritis pain.   ASPIRIN 81 MG TABLET    Take 81 mg by mouth daily.   ATORVASTATIN (LIPITOR) 10 MG TABLET     Take 10 mg by mouth daily.    CALCIUM CARBONATE (OS-CAL) 600 MG TABS TABLET    Take 600 mg by mouth 2 (two) times daily with a meal.   DEXTROMETHORPHAN-GUAIFENESIN (MUCINEX DM) 30-600 MG PER 12 HR TABLET    Take 1 tablet by mouth 2 (two) times daily.   FERROUS SULFATE 325 (65 FE) MG TABLET    Take 325 mg by mouth daily with breakfast.   FUROSEMIDE (LASIX) 20 MG TABLET    Take 20 mg by mouth every other day.   LORAZEPAM (ATIVAN) 0.5 MG TABLET    Take one tablet by mouth four times daily   MELATONIN 3 MG CAPS    Take 3 mg by mouth at bedtime.   MELOXICAM (MOBIC) 7.5 MG TABLET    Take 7.5 mg by mouth daily as needed for pain.   MEMANTINE HCL ER (NAMENDA XR) 14 MG CP24    Take 1 tablet by mouth daily.   METOPROLOL (LOPRESSOR) 100 MG TABLET    Take 1 tablet (100 mg total) by mouth 2 (two) times daily.   POLYETHYLENE GLYCOL (MIRALAX / GLYCOLAX) PACKET    Take 17 g by mouth daily.   TRAZODONE (DESYREL) 50 MG TABLET    Take 50 mg by mouth at bedtime as needed.    VALSARTAN (DIOVAN) 40 MG TABLET    Take 40 mg by mouth every 12 (twelve) hours.   VITAMIN B-12 (CYANOCOBALAMIN) 1000 MCG TABLET    Take 1,000 mcg by mouth daily.  Modified Medications   No medications on file  Discontinued Medications   No medications on file     Physical Exam: Filed Vitals:   12/27/14 1327  BP: 126/70  Pulse: 68  Temp: 97.5 F (36.4 C)  Resp: 20  Weight: 121 lb (54.885 kg)    Physical Exam  Constitutional: She appears well-developed and well-nourished. No distress.  HENT:  Head: Normocephalic and atraumatic.  Mouth/Throat: Oropharynx is clear and moist. No oropharyngeal exudate.  Eyes: Conjunctivae are normal. Pupils are equal, round, and reactive to light.  Neck: Normal range of motion. Neck supple.  Cardiovascular: Normal rate, regular rhythm and normal heart sounds.   Pulmonary/Chest: Effort normal.  Abdominal: Soft. Bowel sounds are normal.  Musculoskeletal: She exhibits tenderness (to right shoulder  with limited ROM). She exhibits no edema.  Neurological: She is alert.  STML  Skin: Skin is warm and dry. She is not diaphoretic.  Psychiatric: She has a normal mood and affect.    Labs reviewed: Basic Metabolic Panel:  Recent Labs  03/04/14 2324 05/27/14 1335  NA 143 146  K 4.6 4.3  CL 103 103  CO2 26  --  GLUCOSE 145* 114*  BUN 29* 30*  CREATININE 0.97 1.20*  CALCIUM 10.6*  --    Liver Function Tests: No results for input(s): AST, ALT, ALKPHOS, BILITOT, PROT, ALBUMIN in the last 8760 hours. No results for input(s): LIPASE, AMYLASE in the last 8760 hours. No results for input(s): AMMONIA in the last 8760 hours. CBC:  Recent Labs  03/04/14 2324 05/27/14 1311 05/27/14 1335  WBC 9.7 8.8  --   NEUTROABS  --  6.4  --   HGB 12.7 9.8* 12.2  HCT 38.4 31.4* 36.0  MCV 80.3 78.7  --   PLT 350 384  --    TSH: No results for input(s): TSH in the last 8760 hours. A1C: Lab Results  Component Value Date   HGBA1C 6.4 04/29/2013   Lipid Panel: No results for input(s): CHOL, HDL, LDLCALC, TRIG, CHOLHDL, LDLDIRECT in the last 8760 hours. Iron and IBC    Result: 11/11/2014 6:08 PM   ( Status: F )       Iron 56     42-145 ug/dL SLN   UIBC 192     125-400 ug/dL SLN   TIBC 248   L 250-470 ug/dL SLN   %SAT 23     20-55 % SLN   CBC with Diff    Result: 11/11/2014 6:16 PM   ( Status: F )       WBC 7.1     4.0-10.5 K/uL SLN   RBC 4.19     3.87-5.11 MIL/uL SLN   Hemoglobin 11.2   L 12.0-15.0 g/dL SLN   Hematocrit 33.4   L 36.0-46.0 % SLN   MCV 79.7     78.0-100.0 fL SLN   MCH 26.7     26.0-34.0 pg SLN   MCHC 33.5     30.0-36.0 g/dL SLN   RDW 19.0   H 11.5-15.5 % SLN   Platelet Count 302     150-400 K/uL SLN   MPV 10.1     9.4-12.4 fL SLN   Granulocyte % 68     43-77 % SLN   Absolute Gran 4.8     1.7-7.7 K/uL SLN   Lymph % 15     12-46 % SLN   Absolute Lymph 1.1     0.7-4.0 K/uL SLN   Mono % 11     3-12 % SLN   Absolute Mono 0.8     0.1-1.0 K/uL SLN   Eos % 5      0-5 % SLN   Absolute Eos 0.4     0.0-0.7 K/uL SLN   Baso % 1     0-1 % SLN   Absolute Baso 0.1     0.0-0.1 K/uL SLN   Smear Review Criteria for review not met  SLN   Basic Metabolic Panel    Result: 11/11/2014 6:08 PM   ( Status: F )       Sodium 141     135-145 mEq/L SLN   Potassium 4.1     3.5-5.3 mEq/L SLN   Chloride 107     96-112 mEq/L SLN   CO2 26     19-32 mEq/L SLN   Glucose 103   H 70-99 mg/dL SLN   BUN 23     6-23 mg/dL SLN   Creatinine 0.82     0.50-1.10 mg/dL SLN   Calcium 9.2     8.4-10.5 mg/dL SLN   Vitamin B12, Serum    Result: 11/11/2014 11:24 PM   (  Status: F )       Vitamin B12 832     211-911 pg/mL SLN   Assessment/Plan  1. Essential hypertension Blood pressure in target range, conts on metoprolol and valsartan  2. Diastolic CHF, chronic No signs of worsening heart failure or recent exacerbation. conts on lasix, betablocker and ARB   3. Dementia with behavioral disturbance -unchanged, behaviors at times but this is unchanged. conts on namenda   4. Primary osteoarthritis involving multiple joints conts on mobic and tyelnol   5. Insomnia -uses melatonin and PRN trazodone, without complaints at this time  6. Constipation, unspecified constipation type Controlled on miralax  7. Anemia, iron deficiency -conts on iron, hgb stable, cont to follow CBC  8. Hyperlipidemia with target LDL less than 70 conts on atorvastatin, LDL at 82 in July   9. Diabetes Not currently on medication, A1c of 7.2 in July, will follow up A1c next month

## 2015-01-07 ENCOUNTER — Encounter (HOSPITAL_COMMUNITY): Payer: Self-pay | Admitting: Emergency Medicine

## 2015-01-07 ENCOUNTER — Inpatient Hospital Stay (HOSPITAL_COMMUNITY)
Admission: EM | Admit: 2015-01-07 | Discharge: 2015-01-10 | DRG: 309 | Disposition: A | Discharge status: Skilled Nursing Facility | Payer: Medicare Other | Attending: Internal Medicine | Admitting: Internal Medicine

## 2015-01-07 ENCOUNTER — Emergency Department (HOSPITAL_COMMUNITY): Payer: Medicare Other

## 2015-01-07 DIAGNOSIS — F01518 Vascular dementia, unspecified severity, with other behavioral disturbance: Secondary | ICD-10-CM | POA: Diagnosis present

## 2015-01-07 DIAGNOSIS — R7303 Prediabetes: Secondary | ICD-10-CM

## 2015-01-07 DIAGNOSIS — R002 Palpitations: Secondary | ICD-10-CM | POA: Diagnosis present

## 2015-01-07 DIAGNOSIS — M1990 Unspecified osteoarthritis, unspecified site: Secondary | ICD-10-CM | POA: Diagnosis not present

## 2015-01-07 DIAGNOSIS — M797 Fibromyalgia: Secondary | ICD-10-CM | POA: Diagnosis present

## 2015-01-07 DIAGNOSIS — Z823 Family history of stroke: Secondary | ICD-10-CM

## 2015-01-07 DIAGNOSIS — I471 Supraventricular tachycardia: Secondary | ICD-10-CM | POA: Diagnosis not present

## 2015-01-07 DIAGNOSIS — F0390 Unspecified dementia without behavioral disturbance: Secondary | ICD-10-CM | POA: Diagnosis not present

## 2015-01-07 DIAGNOSIS — N183 Chronic kidney disease, stage 3 (moderate): Secondary | ICD-10-CM | POA: Diagnosis not present

## 2015-01-07 DIAGNOSIS — Z833 Family history of diabetes mellitus: Secondary | ICD-10-CM

## 2015-01-07 DIAGNOSIS — Z880 Allergy status to penicillin: Secondary | ICD-10-CM | POA: Diagnosis not present

## 2015-01-07 DIAGNOSIS — I2510 Atherosclerotic heart disease of native coronary artery without angina pectoris: Secondary | ICD-10-CM | POA: Diagnosis not present

## 2015-01-07 DIAGNOSIS — Z79899 Other long term (current) drug therapy: Secondary | ICD-10-CM | POA: Diagnosis not present

## 2015-01-07 DIAGNOSIS — R4182 Altered mental status, unspecified: Secondary | ICD-10-CM

## 2015-01-07 DIAGNOSIS — N390 Urinary tract infection, site not specified: Secondary | ICD-10-CM | POA: Diagnosis present

## 2015-01-07 DIAGNOSIS — Z87891 Personal history of nicotine dependence: Secondary | ICD-10-CM | POA: Diagnosis not present

## 2015-01-07 DIAGNOSIS — E785 Hyperlipidemia, unspecified: Secondary | ICD-10-CM | POA: Diagnosis present

## 2015-01-07 DIAGNOSIS — N3000 Acute cystitis without hematuria: Secondary | ICD-10-CM | POA: Diagnosis not present

## 2015-01-07 DIAGNOSIS — I252 Old myocardial infarction: Secondary | ICD-10-CM

## 2015-01-07 DIAGNOSIS — F4489 Other dissociative and conversion disorders: Secondary | ICD-10-CM | POA: Diagnosis not present

## 2015-01-07 DIAGNOSIS — I129 Hypertensive chronic kidney disease with stage 1 through stage 4 chronic kidney disease, or unspecified chronic kidney disease: Secondary | ICD-10-CM | POA: Diagnosis present

## 2015-01-07 DIAGNOSIS — Z885 Allergy status to narcotic agent status: Secondary | ICD-10-CM | POA: Diagnosis not present

## 2015-01-07 DIAGNOSIS — Z8673 Personal history of transient ischemic attack (TIA), and cerebral infarction without residual deficits: Secondary | ICD-10-CM | POA: Diagnosis not present

## 2015-01-07 DIAGNOSIS — Z8701 Personal history of pneumonia (recurrent): Secondary | ICD-10-CM | POA: Diagnosis not present

## 2015-01-07 DIAGNOSIS — E0590 Thyrotoxicosis, unspecified without thyrotoxic crisis or storm: Secondary | ICD-10-CM | POA: Diagnosis not present

## 2015-01-07 DIAGNOSIS — E1122 Type 2 diabetes mellitus with diabetic chronic kidney disease: Secondary | ICD-10-CM | POA: Diagnosis not present

## 2015-01-07 DIAGNOSIS — R000 Tachycardia, unspecified: Secondary | ICD-10-CM | POA: Diagnosis not present

## 2015-01-07 DIAGNOSIS — I5032 Chronic diastolic (congestive) heart failure: Secondary | ICD-10-CM

## 2015-01-07 DIAGNOSIS — Z7982 Long term (current) use of aspirin: Secondary | ICD-10-CM | POA: Diagnosis not present

## 2015-01-07 DIAGNOSIS — I6789 Other cerebrovascular disease: Secondary | ICD-10-CM | POA: Diagnosis not present

## 2015-01-07 DIAGNOSIS — I10 Essential (primary) hypertension: Secondary | ICD-10-CM

## 2015-01-07 DIAGNOSIS — F0151 Vascular dementia with behavioral disturbance: Secondary | ICD-10-CM | POA: Diagnosis present

## 2015-01-07 DIAGNOSIS — R079 Chest pain, unspecified: Secondary | ICD-10-CM | POA: Diagnosis not present

## 2015-01-07 LAB — GLUCOSE, CAPILLARY
GLUCOSE-CAPILLARY: 120 mg/dL — AB (ref 70–99)
Glucose-Capillary: 119 mg/dL — ABNORMAL HIGH (ref 70–99)

## 2015-01-07 LAB — CBC WITH DIFFERENTIAL/PLATELET
BASOS ABS: 0.1 10*3/uL (ref 0.0–0.1)
Basophils Relative: 1 % (ref 0–1)
EOS ABS: 0 10*3/uL (ref 0.0–0.7)
Eosinophils Relative: 0 % (ref 0–5)
HEMATOCRIT: 38.7 % (ref 36.0–46.0)
Hemoglobin: 13.2 g/dL (ref 12.0–15.0)
LYMPHS ABS: 0.8 10*3/uL (ref 0.7–4.0)
LYMPHS PCT: 7 % — AB (ref 12–46)
MCH: 28 pg (ref 26.0–34.0)
MCHC: 34.1 g/dL (ref 30.0–36.0)
MCV: 82 fL (ref 78.0–100.0)
Monocytes Absolute: 0.9 10*3/uL (ref 0.1–1.0)
Monocytes Relative: 8 % (ref 3–12)
Neutro Abs: 9.2 10*3/uL — ABNORMAL HIGH (ref 1.7–7.7)
Neutrophils Relative %: 84 % — ABNORMAL HIGH (ref 43–77)
Platelets: 296 10*3/uL (ref 150–400)
RBC: 4.72 MIL/uL (ref 3.87–5.11)
RDW: 17.7 % — AB (ref 11.5–15.5)
WBC: 11 10*3/uL — ABNORMAL HIGH (ref 4.0–10.5)

## 2015-01-07 LAB — URINALYSIS, ROUTINE W REFLEX MICROSCOPIC
Bilirubin Urine: NEGATIVE
Glucose, UA: NEGATIVE mg/dL
HGB URINE DIPSTICK: NEGATIVE
Ketones, ur: NEGATIVE mg/dL
NITRITE: POSITIVE — AB
PROTEIN: NEGATIVE mg/dL
Specific Gravity, Urine: 1.025 (ref 1.005–1.030)
Urobilinogen, UA: 0.2 mg/dL (ref 0.0–1.0)
pH: 6 (ref 5.0–8.0)

## 2015-01-07 LAB — BASIC METABOLIC PANEL
ANION GAP: 11 (ref 5–15)
BUN: 23 mg/dL (ref 6–23)
CO2: 26 mmol/L (ref 19–32)
CREATININE: 0.96 mg/dL (ref 0.50–1.10)
Calcium: 9.6 mg/dL (ref 8.4–10.5)
Chloride: 106 mEq/L (ref 96–112)
GFR calc non Af Amer: 50 mL/min — ABNORMAL LOW (ref >90)
GFR, EST AFRICAN AMERICAN: 58 mL/min — AB (ref >90)
Glucose, Bld: 122 mg/dL — ABNORMAL HIGH (ref 70–99)
POTASSIUM: 4.6 mmol/L (ref 3.5–5.1)
Sodium: 143 mmol/L (ref 135–145)

## 2015-01-07 LAB — TROPONIN I: Troponin I: 0.03 ng/mL (ref <0.031)

## 2015-01-07 LAB — MRSA PCR SCREENING: MRSA by PCR: NEGATIVE

## 2015-01-07 LAB — URINE MICROSCOPIC-ADD ON

## 2015-01-07 LAB — MAGNESIUM: MAGNESIUM: 2.1 mg/dL (ref 1.5–2.5)

## 2015-01-07 MED ORDER — ASPIRIN 81 MG PO CHEW
81.0000 mg | CHEWABLE_TABLET | Freq: Every day | ORAL | Status: DC
  Administered 2015-01-07 – 2015-01-10 (×5): 81 mg via ORAL
  Filled 2015-01-07 (×4): qty 1

## 2015-01-07 MED ORDER — ALUM & MAG HYDROXIDE-SIMETH 200-200-20 MG/5ML PO SUSP: 30.0000 mL | Freq: Four times a day (QID) | ORAL | Status: DC | PRN

## 2015-01-07 MED ORDER — DEXTROSE 5 % IV SOLN
1.0000 g | Freq: Once | INTRAVENOUS | Status: AC
  Administered 2015-01-07: 1 g via INTRAVENOUS
  Filled 2015-01-07: qty 10

## 2015-01-07 MED ORDER — CEFTRIAXONE SODIUM 1 G IJ SOLR
1.0000 g | INTRAMUSCULAR | Status: DC
  Administered 2015-01-08: 1 g via INTRAVENOUS
  Filled 2015-01-07 (×2): qty 10

## 2015-01-07 MED ORDER — METOPROLOL TARTRATE 100 MG PO TABS
100.0000 mg | ORAL_TABLET | Freq: Two times a day (BID) | ORAL | Status: DC
  Administered 2015-01-07 – 2015-01-10 (×7): 100 mg via ORAL
  Filled 2015-01-07 (×4): qty 1
  Filled 2015-01-07: qty 4
  Filled 2015-01-07 (×3): qty 1

## 2015-01-07 MED ORDER — ENOXAPARIN SODIUM 40 MG/0.4ML ~~LOC~~ SOLN
40.0000 mg | SUBCUTANEOUS | Status: DC
  Administered 2015-01-07 – 2015-01-10 (×4): 40 mg via SUBCUTANEOUS
  Filled 2015-01-07 (×4): qty 0.4

## 2015-01-07 MED ORDER — SODIUM CHLORIDE 0.9 % IJ SOLN
3.0000 mL | Freq: Two times a day (BID) | INTRAMUSCULAR | Status: DC
  Administered 2015-01-07 – 2015-01-10 (×7): 3 mL via INTRAVENOUS

## 2015-01-07 MED ORDER — SENNOSIDES-DOCUSATE SODIUM 8.6-50 MG PO TABS
1.0000 | ORAL_TABLET | Freq: Every evening | ORAL | Status: DC | PRN
  Filled 2015-01-07: qty 1

## 2015-01-07 MED ORDER — ACETAMINOPHEN 325 MG PO TABS: 650.0000 mg | ORAL_TABLET | Freq: Four times a day (QID) | ORAL | Status: DC | PRN

## 2015-01-07 MED ORDER — SODIUM CHLORIDE 0.9 % IV SOLN: 250.0000 mL | INTRAVENOUS | Status: DC | PRN

## 2015-01-07 MED ORDER — ATORVASTATIN CALCIUM 10 MG PO TABS
10.0000 mg | ORAL_TABLET | Freq: Every day | ORAL | Status: DC
  Administered 2015-01-07 – 2015-01-10 (×4): 10 mg via ORAL
  Filled 2015-01-07 (×4): qty 1

## 2015-01-07 MED ORDER — ONDANSETRON HCL 4 MG PO TABS
4.0000 mg | ORAL_TABLET | Freq: Four times a day (QID) | ORAL | Status: DC | PRN
Start: 2015-01-07 — End: 2015-01-10

## 2015-01-07 MED ORDER — TRAZODONE HCL 50 MG PO TABS
50.0000 mg | ORAL_TABLET | Freq: Every evening | ORAL | Status: DC | PRN
Start: 2015-01-07 — End: 2015-01-10
  Administered 2015-01-08: 50 mg via ORAL
  Filled 2015-01-07 (×2): qty 1

## 2015-01-07 MED ORDER — ACETAMINOPHEN 650 MG RE SUPP: 650.0000 mg | Freq: Four times a day (QID) | RECTAL | Status: DC | PRN

## 2015-01-07 MED ORDER — FUROSEMIDE 20 MG PO TABS
20.0000 mg | ORAL_TABLET | ORAL | Status: DC
  Administered 2015-01-07 – 2015-01-09 (×2): 20 mg via ORAL
  Filled 2015-01-07 (×2): qty 1

## 2015-01-07 MED ORDER — DILTIAZEM HCL 30 MG PO TABS
30.0000 mg | ORAL_TABLET | Freq: Two times a day (BID) | ORAL | Status: DC
  Administered 2015-01-07 – 2015-01-08 (×2): 30 mg via ORAL
  Filled 2015-01-07 (×4): qty 1

## 2015-01-07 MED ORDER — ONDANSETRON HCL 4 MG/2ML IJ SOLN: 4.0000 mg | Freq: Four times a day (QID) | INTRAMUSCULAR | Status: DC | PRN

## 2015-01-07 MED ORDER — SODIUM CHLORIDE 0.9 % IJ SOLN
3.0000 mL | INTRAMUSCULAR | Status: DC | PRN
  Filled 2015-01-07: qty 3

## 2015-01-07 NOTE — ED Notes (Signed)
Patients HR increased to 160-175 for 45 seconds or so, spontaneously converted back to NSR without intervention.  Patient noticed palpitations with mild shortness of breath.  Skin warm and dry.  After patient converted back into sinus rhythm, patient states she still feels palpitations

## 2015-01-07 NOTE — ED Notes (Signed)
Dr. Wofford at bedside 

## 2015-01-07 NOTE — Consult Note (Signed)
Primary cardiologist: Dr Clifton James Consulting cardiologist: Dr Dina Rich   Clinical Summary Ms. Wideman is a 81 y.o.female history of palpitations with multiple medical evaluations, dementia, hx of CVA, self reported hx of CAD, DM2, HTN, HL admitted with AMS and palpitations. Reports frequent palpitations for quite some time. Denies any significant chest pain, no SOB, no syncope.    07/2013 EchoLVEF 55-60%, no WMAs, mild MR CXR no acute process Trop neg, K 4.6, Cr 0.96, Hgb 13.2, Plt 296, UA + bacteria EKG SVT, probable atach   Allergies  Allergen Reactions  . Codeine Other (See Comments)    Pt states that it knocks her out.   [name] Penicillins Rash    Hives, upset stomach     Medications Scheduled Medications: . aspirin  81 mg Oral Daily  . atorvastatin  10 mg Oral Daily  . enoxaparin (LOVENOX) injection  40 mg Subcutaneous Q24H  . furosemide  20 mg Oral QODAY  . metoprolol  100 mg Oral BID  . sodium chloride  3 mL Intravenous Q12H     Infusions: . sodium chloride    . [START ON 01/08/2015] cefTRIAXone (ROCEPHIN)  IV       PRN Medications:  sodium chloride, acetaminophen **OR** acetaminophen, alum & mag hydroxide-simeth, ondansetron **OR** ondansetron (ZOFRAN) IV, senna-docusate, sodium chloride, traZODone   Past Medical History  Diagnosis Date  . CVA (cerebral vascular accident)     a. She's not sure that this is true.  [name] CAD (coronary artery disease)     a. reports prior h/o MI's with possible cath @ [facility] in [place] 'many years ago.'  . Vitamin D deficiency   . Diabetes mellitus, type 2   . HTN (hypertension)   . Tremor, essential   . Urinary incontinence   . High risk social situations 11/28/2011  . Intention tremor 03/03/2007    Qualifier: Diagnosis of  By: Dorathy Daft  MD, IDAYLIS    . Syncope, near 02/28/2012  . Altered mental status 01/01/2011    Qualifier: Diagnosis of  By: Earnest Bailey MD, Selena Batten    . DEFICIENCY, VITAMIN D NOS 07/16/2007    Qualifier:  Diagnosis of  By: Irving Burton MD, Clifton Custard    . Asthma   . History of pneumonia   . Recurrent upper respiratory infection (URI)   . Hyperthyroidism   . Anemia   . CKD (chronic kidney disease), stage II   . H/O hiatal hernia   . Seizures   . Headache(784.0)   . Neuromuscular disorder   . Arthritis   . Fibromyalgia   . CAROTID ARTERY DISEASE 07/16/2007    a. 07/2012 U/S: no signif extracranial carotid artery stenosis, antegrad vertebral flow.  . Diastolic CHF, chronic     a. 07/2013 Echo: EF 55-60%.  . TOBACCO USER   . Palpitations     a. 8.2014 Echo: EF 55-60%, no rwma, mod LVH, mildly dil LA, PASP .  [name] Systolic murmur     a. no significant valvular abnormalities on echo 8.2014.  [name] HLD (hyperlipidemia)     Past Surgical History  Procedure Laterality Date  . Appendectomy  1965  . Vaginal hysterectomy  1965  . Knee surgery  1989, 1995    Family History  Problem Relation Age of Onset  . Diabetes Mother   . Diabetes Brother   . Stroke Father     Social History Ms. Dodgen reports that she has been smoking Cigarettes.  She has been smoking about 0.50 packs per  day. She has never used smokeless tobacco. Ms. Maisie Fushomas reports that she does not drink alcohol.  Review of Systems CONSTITUTIONAL: No weight loss, fever, chills, weakness or fatigue.  HEENT: Eyes: No visual loss, blurred vision, double vision or yellow sclerae. No hearing loss, sneezing, congestion, runny nose or sore throat.  SKIN: No rash or itching.  CARDIOVASCULAR: No chest pain, chest pressure or chest discomfort. No palpitations or edema.  RESPIRATORY: No shortness of breath, cough or sputum.  GASTROINTESTINAL: No anorexia, nausea, vomiting or diarrhea. No abdominal pain or blood.  GENITOURINARY: no polyuria, no dysuria NEUROLOGICAL: No headache, dizziness, syncope, paralysis, ataxia, numbness or tingling in the extremities. No change in bowel or bladder control.  MUSCULOSKELETAL: No muscle, back pain, joint pain  or stiffness.  HEMATOLOGIC: No anemia, bleeding or bruising.  LYMPHATICS: No enlarged nodes. No history of splenectomy.  PSYCHIATRIC: No history of depression or anxiety.      Physical Examination Blood pressure 129/85, pulse 87, temperature 98.6 F (37 C), temperature source Oral, resp. rate 11, SpO2 100 %.  Intake/Output Summary (Last 24 hours) at 01/07/15 1525 Last data filed at 01/07/15 1411  Gross per 24 hour  Intake      3 ml  Output      0 ml  Net      3 ml    HEENT: sclera clear  Cardiovascular: RRR, no m/r/g, no JVD  Respiratory: CTAB  GI: abdomen soft, NT, ND  MSK: no LE edema  Neuro: no focal deficits  Psych: appropriate affect   Lab Results  Basic Metabolic Panel:  Recent Labs Lab 01/07/15 1010  NA 143  K 4.6  CL 106  CO2 26  GLUCOSE 122*  BUN 23  CREATININE 0.96  CALCIUM 9.6    Liver Function Tests: No results for input(s): AST, ALT, ALKPHOS, BILITOT, PROT, ALBUMIN in the last 168 hours.  CBC:  Recent Labs Lab 01/07/15 1010  WBC 11.0*  NEUTROABS 9.2*  HGB 13.2  HCT 38.7  MCV 82.0  PLT 296    Cardiac Enzymes:  Recent Labs Lab 01/07/15 1010  TROPONINI 0.03    BNP: Invalid input(s): POCBNP   ECG   Imaging   Impression/Recommendations 1. PSVT - long history of symptomatic palpitations, EKG this admits shows atach. Potentially exacerbated by UTI - on lopressor 100mg  bid at home, will add dilt 30mg  bid as well.  - echo without signicant structural heart disease. TSH and Mg added to labs.  2. UTI - per primary team  3. AMS - per primary team.   Dina RichJonathan Devera Englander, M.D.

## 2015-01-07 NOTE — ED Notes (Signed)
Patient spontaneously converted into SVT while I was in room with patient.  While patient was in SVT, she stated that she could feel her heart rate going fast but denied chest pain.  Had patient blow in straw and within a minute or so patient converted back into NSR.

## 2015-01-07 NOTE — ED Notes (Signed)
Patient had a brief episode lasting less than one minute of tachycardia (140-155).  Patient denies chest pain, SOB, lightheadedness, nausea and back pains.

## 2015-01-07 NOTE — ED Notes (Signed)
Hospitalist at bedside 

## 2015-01-07 NOTE — ED Provider Notes (Signed)
CSN: [id number]     Arrival date & time 01/07/15  6213 History   First MD Initiated Contact with Patient 01/07/15 [phone]     Chief Complaint  Patient presents with  . Anxiety     (Consider location/radiation/quality/duration/timing/severity/associated sxs/prior Treatment) Patient is a 81 y.o. female presenting with palpitations.  Palpitations Palpitations quality:  Fast Onset quality:  Sudden Timing:  Intermittent Progression:  Resolved Chronicity:  Recurrent Context: anxiety   Context comment:  Complained of chst pain and SOB to nursing facility staff, but denies this now.  Relieved by:  Nothing Worsened by:  Nothing tried Associated symptoms: no chest pain, no dizziness, no nausea, no shortness of breath and no vomiting     Past Medical History  Diagnosis Date  . CVA (cerebral vascular accident)     a. She's not sure that this is true.  [name] CAD (coronary artery disease)     a. reports prior h/o MI's with possible cath @ [facility] in [place] 'many years ago.'  . Vitamin D deficiency   . Diabetes mellitus, type 2   . HTN (hypertension)   . Tremor, essential   . Urinary incontinence   . High risk social situations 11/28/2011  . Intention tremor 03/03/2007    Qualifier: Diagnosis of  By: Dorathy Daft  MD, IDAYLIS    . Syncope, near 02/28/2012  . Altered mental status 01/01/2011    Qualifier: Diagnosis of  By: Earnest Bailey MD, Selena Batten    . DEFICIENCY, VITAMIN D NOS 07/16/2007    Qualifier: Diagnosis of  By: Irving Burton MD, Clifton Custard    . Asthma   . History of pneumonia   . Recurrent upper respiratory infection (URI)   . Hyperthyroidism   . Anemia   . CKD (chronic kidney disease), stage II   . H/O hiatal hernia   . Seizures   . Headache(784.0)   . Neuromuscular disorder   . Arthritis   . Fibromyalgia   . CAROTID ARTERY DISEASE 07/16/2007    a. 07/2012 U/S: no signif extracranial carotid artery stenosis, antegrad vertebral flow.  . Diastolic CHF, chronic     a. 07/2013 Echo: EF 55-60%.  .  TOBACCO USER   . Palpitations     a. 8.2014 Echo: EF 55-60%, no rwma, mod LVH, mildly dil LA, PASP .  [name] Systolic murmur     a. no significant valvular abnormalities on echo 8.2014.  [name] HLD (hyperlipidemia)    Past Surgical History  Procedure Laterality Date  . Appendectomy  1965  . Vaginal hysterectomy  1965  . Knee surgery  1989, 1995   Family History  Problem Relation Age of Onset  . Diabetes Mother   . Diabetes Brother   . Stroke Father    History  Substance Use Topics  . Smoking status: Current Some Day Smoker -- 0.50 packs/day    Types: Cigarettes    Last Attempt to Quit: 04/06/2012  . Smokeless tobacco: Never Used     Comment: does not smoke much...  . Alcohol Use: No   OB History    No data available     Review of Systems  Respiratory: Negative for shortness of breath.   Cardiovascular: Positive for palpitations. Negative for chest pain.  Gastrointestinal: Negative for nausea and vomiting.  Neurological: Negative for dizziness.  All other systems reviewed and are negative.     Allergies  Codeine and Penicillins  Home Medications   Prior to Admission medications   Medication Sig Start Date End Date  Taking? Authorizing Provider  acetaminophen (TYLENOL ARTHRITIS PAIN) 650 MG CR tablet Take 1 tablet (650 mg total) by mouth every 8 (eight) hours. For arthritis pain. 03/19/12   Edd ArbourJonathan Orton, MD  aspirin 81 MG tablet Take 81 mg by mouth daily.    Historical Provider, MD  atorvastatin (LIPITOR) 10 MG tablet Take 10 mg by mouth daily.  03/01/14   Historical Provider, MD  calcium carbonate (OS-CAL) 600 MG TABS tablet Take 600 mg by mouth 2 (two) times daily with a meal.    Historical Provider, MD  dextromethorphan-guaiFENesin (MUCINEX DM) 30-600 MG per 12 hr tablet Take 1 tablet by mouth 2 (two) times daily.    Historical Provider, MD  ferrous sulfate 325 (65 FE) MG tablet Take 325 mg by mouth daily with breakfast.    Historical Provider, MD  furosemide  (LASIX) 20 MG tablet Take 20 mg by mouth every other day.    Historical Provider, MD  LORazepam (ATIVAN) 0.5 MG tablet Take one tablet by mouth four times daily 08/23/14   Oneal GroutMahima Pandey, MD  Melatonin 3 MG CAPS Take 3 mg by mouth at bedtime.    Historical Provider, MD  meloxicam (MOBIC) 7.5 MG tablet Take 7.5 mg by mouth daily as needed for pain.    Historical Provider, MD  Memantine HCl ER (NAMENDA XR) 14 MG CP24 Take 1 tablet by mouth daily.    Historical Provider, MD  metoprolol (LOPRESSOR) 100 MG tablet Take 1 tablet (100 mg total) by mouth 2 (two) times daily. 09/14/13   Jamal CollinJames R Joyner, MD  polyethylene glycol [facility](MIRALAX / Ethelene HalGLYCOLAX) packet Take 17 g by mouth daily.    Historical Provider, MD  traZODone (DESYREL) 50 MG tablet Take 50 mg by mouth at bedtime as needed.  03/21/14   Historical Provider, MD  valsartan (DIOVAN) 40 MG tablet Take 40 mg by mouth every 12 (twelve) hours.    Historical Provider, MD  vitamin B-12 (CYANOCOBALAMIN) 1000 MCG tablet Take 1,000 mcg by mouth daily.    Historical Provider, MD   BP 121/67 mmHg  Pulse 81  Temp(Src) 98.6 F (37 C) (Oral)  Resp 13  SpO2 99% Physical Exam  Constitutional: She appears well-developed and well-nourished. No distress.  HENT:  Head: Normocephalic and atraumatic.  Mouth/Throat: Oropharynx is clear and moist.  Eyes: Conjunctivae are normal. Pupils are equal, round, and reactive to light. No scleral icterus.  Neck: Neck supple.  Cardiovascular: Normal rate, regular rhythm, normal heart sounds and intact distal pulses.   No murmur heard. Pulmonary/Chest: Effort normal and breath sounds normal. No stridor. No respiratory distress. She has no rales.  Abdominal: Soft. Bowel sounds are normal. She exhibits no distension. There is no tenderness.  Musculoskeletal: Normal range of motion.  Neurological: She is alert. She is disoriented (oriented to place and person. ).  Skin: Skin is warm and dry. No rash noted.  Psychiatric: She has a normal  mood and affect. Her behavior is normal.  Nursing note and vitals reviewed.   ED Course  Procedures (including critical care time) Labs Review All labs drawn in ED reviewed.   Imaging Review Dg Chest Port 1 View  01/07/2015   CLINICAL DATA:  Tachycardia.  Chest pain.  EXAM: PORTABLE CHEST - 1 VIEW  COMPARISON:  05/27/2014  FINDINGS: Heart size is within normal limits. Both lungs remain clear. No evidence of pleural effusion. Mild thoracolumbar scoliosis again noted.  IMPRESSION: No active disease.   Electronically Signed   By: Jonny RuizJohn  Eppie Gibson M.D.   On: 01/07/2015 11:25  All radiology studies independently viewed by me.      EKG Interpretation   Date/Time:  Saturday January 07 2015 10:01:29 EST Ventricular Rate:  171 PR Interval:  172 QRS Duration: 85 QT Interval:  301 QTC Calculation: 508 R Axis:   80 Text Interpretation:  Supraventricular tachycardia Sinus pause Borderline  low voltage, extremity leads ST depression, probably rate related compared  to prior, SVT replaced sinus rhythm Confirmed by [facility]  MD, TREY ([id number])  on 01/07/2015 10:49:58 AM      MDM   Final diagnoses:  Tachycardia  SVT  UTI   81 yo female with palpitations and confusion.  During evaluation, she had multiple episodes of SVT which were treated successfully with vagal maneuvers.  She was also found to have a UTI.  Treated for UTI, consulted cardiology, admitted to medicine.      Candyce Churn III, MD 01/08/15 [phone]

## 2015-01-07 NOTE — ED Notes (Signed)
EMS - [facility] episode [facility] that started around 8:00am.  Staff at the facility stated she was having chest pain but has denied with EMS.  Describes the episode at "heart palpitations".

## 2015-01-07 NOTE — H&P (Signed)
[facility] History and Physical  [name]:[id number] DOB: 09/23/1920 DOA: 01/07/2015   PCP: Marikay Alar, MD    Chief Complaint: palpitations  HPI: [name] is a 81 y.o. female with dementia, CAD, DM 2, HTN admitted from a nursing facility due to palpitations, dyspnea and chest pain. The patient only remembers having palpitations. She is quite confused and thinks she was a church today and asking to be released to go home stating she lives across the street and will be back in a few minutes. Difficult to obtain a history from her due to her agitation about leaving.   ROS: unable to obtain due to confusion   Past Medical History  Diagnosis Date  . CVA (cerebral vascular accident)     a. She's not sure that this is true.  [name] CAD (coronary artery disease)     a. reports prior h/o MI's with possible cath @ [facility] in [place] 'many years ago.'  . Vitamin D deficiency   . Diabetes mellitus, type 2   . HTN (hypertension)   . Tremor, essential   . Urinary incontinence   . High risk social situations 11/28/2011  . Intention tremor 03/03/2007    Qualifier: Diagnosis of  By: Dorathy Daft  MD, IDAYLIS    . Syncope, near 02/28/2012  . Altered mental status 01/01/2011    Qualifier: Diagnosis of  By: Earnest Bailey MD, Selena Batten    . DEFICIENCY, VITAMIN D NOS 07/16/2007    Qualifier: Diagnosis of  By: Irving Burton MD, Clifton Custard    . Asthma   . History of pneumonia   . Recurrent upper respiratory infection (URI)   . Hyperthyroidism   . Anemia   . CKD (chronic kidney disease), stage II   . H/O hiatal hernia   . Seizures   . Headache(784.0)   . Neuromuscular disorder   . Arthritis   . Fibromyalgia   . CAROTID ARTERY DISEASE 07/16/2007    a. 07/2012 U/S: no signif extracranial carotid artery stenosis, antegrad vertebral flow.  . Diastolic CHF, chronic     a. 07/2013 Echo: EF 55-60%.  . TOBACCO USER   . Palpitations     a. 8.2014 Echo: EF 55-60%, no rwma, mod LVH, mildly dil LA,  PASP .  [name] Systolic murmur     a. no significant valvular abnormalities on echo 8.2014.  [name] HLD (hyperlipidemia)     Past Surgical History  Procedure Laterality Date  . Appendectomy  1965  . Vaginal hysterectomy  1965  . Knee surgery  1989, 1995    Social History: smoked cigarettes in the past, does not drink alcohol Lives at a nursing home    Allergies  Allergen Reactions  . Codeine Other (See Comments)    Pt states that it knocks her out.   [name] Penicillins Rash    Hives, upset stomach     Family History  Problem Relation Age of Onset  . Diabetes Mother   . Diabetes Brother   . Stroke Father      Prior to Admission medications   Medication Sig Start Date End Date Taking? Authorizing Provider  acetaminophen (TYLENOL ARTHRITIS PAIN) 650 MG CR tablet Take 1 tablet (650 mg total) by mouth every 8 (eight) hours. For arthritis pain. 03/19/12  Yes Edd Arbour, MD  aspirin 81 MG tablet Take 81 mg by mouth daily.   Yes Historical Provider, MD  atorvastatin (LIPITOR) 10 MG tablet Take 10 mg by mouth daily.  03/01/14  Yes  Historical Provider, MD  calcium carbonate (OS-CAL) 600 MG TABS tablet Take 600 mg by mouth 2 (two) times daily with a meal.   Yes Historical Provider, MD  ferrous sulfate 325 (65 FE) MG tablet Take 325 mg by mouth daily with breakfast.   Yes Historical Provider, MD  furosemide (LASIX) 20 MG tablet Take 20 mg by mouth every other day.   Yes Historical Provider, MD  LORazepam (ATIVAN) 0.5 MG tablet Take one tablet by mouth four times daily 08/23/14  Yes Mahima Pandey, MD  Melatonin 3 MG CAPS Take 3 mg by mouth at bedtime.   Yes Historical Provider, MD  meloxicam (MOBIC) 7.5 MG tablet Take 7.5 mg by mouth daily as needed for pain.   Yes Historical Provider, MD  Memantine HCl ER (NAMENDA XR) 14 MG CP24 Take 1 tablet by mouth daily.   Yes Historical Provider, MD  metoprolol (LOPRESSOR) 100 MG tablet Take 1 tablet (100 mg total) by mouth 2 (two) times daily. 09/14/13   Yes Jamal Collin, MD  polyethylene glycol [facility] / GLYCOLAX) packet Take 17 g by mouth daily.   Yes Historical Provider, MD  traZODone (DESYREL) 50 MG tablet Take 50 mg by mouth at bedtime as needed.  03/21/14  Yes Historical Provider, MD  valsartan (DIOVAN) 40 MG tablet Take 40 mg by mouth every 12 (twelve) hours.   Yes Historical Provider, MD  vitamin B-12 (CYANOCOBALAMIN) 1000 MCG tablet Take 1,000 mcg by mouth daily.   Yes Historical Provider, MD     Physical Exam: Filed Vitals:   01/07/15 1300 01/07/15 1315 01/07/15 1345 01/07/15 1411  BP: 156/94 130/76 132/79 138/91  Pulse: 101  93 94  Temp:      TempSrc:      Resp: SpO2: 100%  98%      General: Awake and alert but confused, no distress HEENT: Normocephalic and Atraumatic, Mucous membranes pink                PERRLA; EOM intact; No scleral icterus,                 Nares: Patent, Oropharynx: Clear, Fair Dentition                 Neck: FROM, no cervical lymphadenopathy, thyromegaly, carotid bruit or JVD;  Breasts: deferred CHEST WALL: No tenderness  CHEST: Normal respiration, clear to auscultation bilaterally  HEART: Regular rate and rhythm; no murmurs rubs or gallops - tachycardic - irregular BACK: No kyphosis or scoliosis; no CVA tenderness  ABDOMEN: Positive Bowel Sounds, soft, non-tender; no masses, no organomegaly Rectal Exam: deferred EXTREMITIES: No cyanosis, clubbing, or edema Genitalia: not examined  SKIN:  no rash or ulceration  CNS: Alert and Oriented x 4, Nonfocal exam, CN 2-12 intact  Labs on Admission:  Basic Metabolic Panel:  Recent Labs Lab 01/07/15 1010  NA 143  K 4.6  CL 106  CO2 26  GLUCOSE 122*  BUN 23  CREATININE 0.96  CALCIUM 9.6   Liver Function Tests: No results for input(s): AST, ALT, ALKPHOS, BILITOT, PROT, ALBUMIN in the last 168 hours. No results for input(s): LIPASE, AMYLASE in the last 168 hours. No results for input(s): AMMONIA in the last 168  hours. CBC:  Recent Labs Lab 01/07/15 1010  WBC 11.0*  NEUTROABS 9.2*  HGB 13.2  HCT 38.7  MCV 82.0  PLT 296   Cardiac Enzymes:  Recent Labs Lab 01/07/15 1010  TROPONINI 0.03  BNP (last 3 results)  Recent Labs  05/27/14 1311  PROBNP 214.6   CBG: No results for input(s): GLUCAP in the last 168 hours.  Radiological Exams on Admission: Dg Chest Port 1 View  01/07/2015   CLINICAL DATA:  Tachycardia.  Chest pain.  EXAM: PORTABLE CHEST - 1 VIEW  COMPARISON:  05/27/2014  FINDINGS: Heart size is within normal limits. Both lungs remain clear. No evidence of pleural effusion. Mild thoracolumbar scoliosis again noted.  IMPRESSION: No active disease.   Electronically Signed   By: Myles RosenthalJohn  Stahl M.D.   On: 01/07/2015 11:25    EKG: Independently reviewed. SVT at 171 bpm  Assessment/Plan Principal Problem:   SVT (supraventricular tachycardia) - currently HR in low 100's - resume Lopressor (has not received it yet today) - cardiology consult requested  Active Problems:   Type 2 diabetes mellitus, controlled - must be diet controlled as she is not on medications for it    Essential hypertension - cont Valsartan, Metoprolol    Diastolic CHF, chronic - cont Lasix    Dementia without behavioral disturbance - monitor- PRNAtivan given at facility- will resume  CKD stage 3 - stable    Consulted: cardiology consulted by ER  Code Status: Full code  Family Communication:  DVT Prophylaxis:Lovenox  Time spent: 40 min  Arneshia Ade, MD [facility]  If 7PM-7AM, please contact night-coverage [email or web address] 01/07/2015, 2:16 PM

## 2015-01-08 LAB — GLUCOSE, CAPILLARY
Glucose-Capillary: 123 mg/dL — ABNORMAL HIGH (ref 70–99)
Glucose-Capillary: 133 mg/dL — ABNORMAL HIGH (ref 70–99)
Glucose-Capillary: 133 mg/dL — ABNORMAL HIGH (ref 70–99)
Glucose-Capillary: 128 mg/dL — ABNORMAL HIGH (ref 70–99)

## 2015-01-08 LAB — BASIC METABOLIC PANEL
ANION GAP: 3 — AB (ref 5–15)
BUN: 19 mg/dL (ref 6–23)
CHLORIDE: 107 meq/L (ref 96–112)
CO2: 30 mmol/L (ref 19–32)
CREATININE: 0.86 mg/dL (ref 0.50–1.10)
Calcium: 8.9 mg/dL (ref 8.4–10.5)
GFR, EST AFRICAN AMERICAN: 66 mL/min — AB (ref >90)
GFR, EST NON AFRICAN AMERICAN: 57 mL/min — AB (ref >90)
Glucose, Bld: 120 mg/dL — ABNORMAL HIGH (ref 70–99)
Potassium: 3.9 mmol/L (ref 3.5–5.1)
Sodium: 140 mmol/L (ref 135–145)

## 2015-01-08 LAB — CBC
HCT: 37.6 % (ref 36.0–46.0)
HEMOGLOBIN: 12.3 g/dL (ref 12.0–15.0)
MCH: 26.9 pg (ref 26.0–34.0)
MCHC: 32.7 g/dL (ref 30.0–36.0)
MCV: 82.1 fL (ref 78.0–100.0)
PLATELETS: 281 10*3/uL (ref 150–400)
RBC: 4.58 MIL/uL (ref 3.87–5.11)
RDW: 17.8 % — ABNORMAL HIGH (ref 11.5–15.5)
WBC: 6.6 10*3/uL (ref 4.0–10.5)

## 2015-01-08 LAB — TSH: TSH: 0.57 u[IU]/mL (ref 0.350–4.500)

## 2015-01-08 MED ORDER — DILTIAZEM HCL 30 MG PO TABS
30.0000 mg | ORAL_TABLET | Freq: Three times a day (TID) | ORAL | Status: DC
  Administered 2015-01-08 – 2015-01-09 (×3): 30 mg via ORAL
  Filled 2015-01-08 (×8): qty 1

## 2015-01-08 NOTE — Progress Notes (Signed)
Patient ID: [name], female   DOB: 1920-04-13, 81 y.o.   MRN: [id number]     Subjective:   + palpitations overnight  Objective:   Temp:  [97.5 F (36.4 C)-98.2 F (36.8 C)] 98 F (36.7 C) (01/17 0659) Pulse Rate:  [58-101] 59 (01/17 0659) Resp:  [11-22] 18 (01/17 0659) BP: (105-166)/(60-99) 130/67 mmHg (01/17 0659) SpO2:  [91 %-100 %] 98 % (01/17 0659) Weight:  [118 lb 8 oz (53.751 kg)-120 lb 1.6 oz (54.477 kg)] 120 lb 1.6 oz (54.477 kg) (01/17 0659) Last BM Date:  (poor historian)  Filed Weights   01/07/15 1625 01/08/15 0659  Weight: 118 lb 8 oz (53.751 kg) 120 lb 1.6 oz (54.477 kg)    Intake/Output Summary (Last 24 hours) at 01/08/15 1006 Last data filed at 01/07/15 2216  Gross per 24 hour  Intake    543 ml  Output    201 ml  Net    342 ml    Telemetry: NSR and SVT overnight Exam:  General: NAD  Resp: CTAB  Cardiac: RRR, no m/r/g, no JVD  GI: abdomen soft, NT, ND  MSK:no LE edema  Neuro: no focal deficits   Lab Results:  Basic Metabolic Panel:  Recent Labs Lab 01/07/15 1010 01/08/15 0515  NA 143 140  K 4.6 3.9  CL 106 107  CO2 26 30  GLUCOSE 122* 120*  BUN 23 19  CREATININE 0.96 0.86  CALCIUM 9.6 8.9  MG 2.1  --     Liver Function Tests: No results for input(s): AST, ALT, ALKPHOS, BILITOT, PROT, ALBUMIN in the last 168 hours.  CBC:  Recent Labs Lab 01/07/15 1010 01/08/15 0515  WBC 11.0* 6.6  HGB 13.2 12.3  HCT 38.7 37.6  MCV 82.0 82.1  PLT 296 281    Cardiac Enzymes:  Recent Labs Lab 01/07/15 1010  TROPONINI 0.03    BNP:  Recent Labs  05/27/14 1311  PROBNP 214.6    Coagulation: No results for input(s): INR in the last 168 hours.  ECG:   Medications:   Scheduled Medications: . aspirin  81 mg Oral Daily  . atorvastatin  10 mg Oral Daily  . cefTRIAXone (ROCEPHIN)  IV  1 g Intravenous Q24H  . diltiazem  30 mg Oral BID  . enoxaparin (LOVENOX) injection  40 mg Subcutaneous Q24H  . furosemide  20 mg  Oral QODAY  . metoprolol  100 mg Oral BID  . sodium chloride  3 mL Intravenous Q12H     Infusions:     PRN Medications:  sodium chloride, acetaminophen **OR** acetaminophen, alum & mag hydroxide-simeth, ondansetron **OR** ondansetron (ZOFRAN) IV, senna-docusate, sodium chloride, traZODone     Assessment/Plan   1. PSVT - long history of symptomatic palpitations, EKG this admits shows atach.  - on lopressor 100mg  bid at home, added dilt 30mg  bid. Tolerated hemodynamically but had runs of SVT overnight. Will increase dilt to 30mg  tid with hold parameters - echo without signicant structural heart disease. TSH normal, K and Mg WNL.   2. Dementia - per primary team, unclear how much of mental status is chronic       Dina RichJonathan Branch, M.D.

## 2015-01-08 NOTE — Progress Notes (Signed)
UR Completed.  [phone]  

## 2015-01-08 NOTE — Progress Notes (Signed)
PROGRESS NOTE    [name] ZOX:[id number] DOB: October 17, 1920 DOA: 01/07/2015 PCP: [name], Eric, MD  HPI/Brief narrative 81 year old female patient with history of dementia, CVA, CAD, DM 2, HTN, nursing home resident, admitted with palpitations and found to have PSVT precipitated by UTI. Cardiology following.   Assessment/Plan:  1. PSVT: Long history of symptomatic palpitations. On Lopressor 100 MG twice a day at home. Cardiology had initiated diltiazem 30 MG twice a day on admission. Continue to have runs of SVT overnight and diltiazem increased to 30 mg 3 times a day. Monitor on telemetry. Echo without structural heart disease (08/18/13). TSH normal. K and Mg wnl. 2. UTI: Continue IV Rocephin pending culture results. 3. Advanced dementia: Mental status probably at baseline. 4. Essential hypertension: Fluctuating but reasonably controlled. Continue metoprolol and diltiazem. 5. History of chronic diastolic CHF: Continue Lasix. Compensated. 6. Diet controlled DM 2: Well-controlled inpatient. 7. Stage II chronic kidney disease: Creatinine stable   Code Status: Full Family Communication: none at bedside Disposition Plan: Return to NH when stable.   Consultants:  Cardiology  Procedures:  None  Antibiotics:  IV Rocephin 1/16>   Subjective: Denies complaints. Frustrated that she couldn't sleep well at night due to constant disturbance. Denies palpitations or chest pain.  Objective: Filed Vitals:   01/07/15 1625 01/07/15 2106 01/08/15 0225 01/08/15 0659  BP: 135/72 151/89 117/60 130/67  Pulse: 68 69 58 59  Temp: 97.5 F (36.4 C) 98.2 F (36.8 C) 97.6 F (36.4 C) 98 F (36.7 C)  TempSrc: Oral Oral Oral Oral  Resp: 18 18 17 18   Height: 5\' 4"  (1.626 m)     Weight: 53.751 kg (118 lb 8 oz)   54.477 kg (120 lb 1.6 oz)  SpO2: 100% 100% 100% 98%    Intake/Output Summary (Last 24 hours) at 01/08/15 1227 Last data filed at 01/07/15 2216  Gross per 24 hour  Intake     543 ml  Output    201 ml  Net    342 ml   Filed Weights   01/07/15 1625 01/08/15 0659  Weight: 53.751 kg (118 lb 8 oz) 54.477 kg (120 lb 1.6 oz)     Exam:  General exam: Pleasant elderly female lying comfortably supine in bed. Respiratory system: Clear/poor inspiratory effort. No increased work of breathing. Cardiovascular system: S1 & S2 heard, RRR. No JVD, murmurs, gallops, clicks or pedal edema. Telemetry: Sinus rhythm with occasional PVCs. Short runs of PSVT overnight. Gastrointestinal system: Abdomen is nondistended, soft and nontender. Normal bowel sounds heard. Central nervous system: Alert and oriented only to self. No focal neurological deficits. Extremities: Symmetric 5 x 5 power.   Data Reviewed: Basic Metabolic Panel:  Recent Labs Lab 01/07/15 1010 01/08/15 0515  NA 143 140  K 4.6 3.9  CL 106 107  CO2 26 30  GLUCOSE 122* 120*  BUN 23 19  CREATININE 0.96 0.86  CALCIUM 9.6 8.9  MG 2.1  --    Liver Function Tests: No results for input(s): AST, ALT, ALKPHOS, BILITOT, PROT, ALBUMIN in the last 168 hours. No results for input(s): LIPASE, AMYLASE in the last 168 hours. No results for input(s): AMMONIA in the last 168 hours. CBC:  Recent Labs Lab 01/07/15 1010 01/08/15 0515  WBC 11.0* 6.6  NEUTROABS 9.2*  --   HGB 13.2 12.3  HCT 38.7 37.6  MCV 82.0 82.1  PLT 296 281   Cardiac Enzymes:  Recent Labs Lab 01/07/15 1010  TROPONINI 0.03   BNP (  last 3 results)  Recent Labs  05/27/14 1311  PROBNP 214.6   CBG:  Recent Labs Lab 01/07/15 1707 01/07/15 2111 01/08/15 0702 01/08/15 1110  GLUCAP 120* 119* 123* 133*    Recent Results (from the past 240 hour(s))  MRSA PCR Screening     Status: None   Collection Time: 01/07/15  6:48 PM  Result Value Ref Range Status   MRSA by PCR NEGATIVE NEGATIVE Final    Comment:        The GeneXpert MRSA Assay (FDA approved for NASAL specimens only), is one component of a comprehensive MRSA  colonization surveillance program. It is not intended to diagnose MRSA infection nor to guide or monitor treatment for MRSA infections.            Studies: Dg Chest Port 1 View  01/07/2015   CLINICAL DATA:  Tachycardia.  Chest pain.  EXAM: PORTABLE CHEST - 1 VIEW  COMPARISON:  05/27/2014  FINDINGS: Heart size is within normal limits. Both lungs remain clear. No evidence of pleural effusion. Mild thoracolumbar scoliosis again noted.  IMPRESSION: No active disease.   Electronically Signed   By: Myles Rosenthal M.D.   On: 01/07/2015 11:25        Scheduled Meds: . aspirin  81 mg Oral Daily  . atorvastatin  10 mg Oral Daily  . cefTRIAXone (ROCEPHIN)  IV  1 g Intravenous Q24H  . diltiazem  30 mg Oral 3 times per day  . enoxaparin (LOVENOX) injection  40 mg Subcutaneous Q24H  . furosemide  20 mg Oral QODAY  . metoprolol  100 mg Oral BID  . sodium chloride  3 mL Intravenous Q12H   Continuous Infusions:   Principal Problem:   SVT (supraventricular tachycardia) Active Problems:   Type 2 diabetes mellitus, controlled   Essential hypertension   Diastolic CHF, chronic   Dementia without behavioral disturbance   DM type 2 causing CKD stage 3   PSVT (paroxysmal supraventricular tachycardia)    Time spent: 30 minutes.    Marcellus Scott, MD, FACP, FHM. [facility] Pager [phone]  If 7PM-7AM, please contact night-coverage [email or web address] Password [email or web address] 01/08/2015, 12:27 PM    LOS: 1 day

## 2015-01-09 ENCOUNTER — Encounter (HOSPITAL_COMMUNITY): Payer: Self-pay

## 2015-01-09 DIAGNOSIS — R4182 Altered mental status, unspecified: Secondary | ICD-10-CM

## 2015-01-09 DIAGNOSIS — N390 Urinary tract infection, site not specified: Secondary | ICD-10-CM

## 2015-01-09 DIAGNOSIS — N3000 Acute cystitis without hematuria: Secondary | ICD-10-CM

## 2015-01-09 LAB — GLUCOSE, CAPILLARY
Glucose-Capillary: 140 mg/dL — ABNORMAL HIGH (ref 70–99)
Glucose-Capillary: 160 mg/dL — ABNORMAL HIGH (ref 70–99)

## 2015-01-09 MED ORDER — DILTIAZEM HCL 30 MG PO TABS
30.0000 mg | ORAL_TABLET | Freq: Four times a day (QID) | ORAL | Status: DC
  Administered 2015-01-09 – 2015-01-10 (×4): 30 mg via ORAL
  Filled 2015-01-09 (×8): qty 1

## 2015-01-09 MED ORDER — CEFUROXIME AXETIL 250 MG PO TABS
250.0000 mg | ORAL_TABLET | Freq: Two times a day (BID) | ORAL | Status: DC
  Administered 2015-01-09 – 2015-01-10 (×4): 250 mg via ORAL
  Filled 2015-01-09 (×7): qty 1

## 2015-01-09 NOTE — Progress Notes (Signed)
PROGRESS NOTE    [name] ZOX:[id number] DOB: April 15, 1920 DOA: 01/07/2015 PCP: Marikay Alar, MD  HPI/Brief narrative 81 year old female patient with history of dementia, CVA, CAD, DM 2, HTN, nursing home resident, admitted with palpitations and found to have PSVT precipitated by UTI. Cardiology following.   Assessment/Plan:  1. PSVT: Long history of symptomatic palpitations. On Lopressor 100 MG twice a day at home. Cardiology has initiated and titrating diltiazem 30 MG qid today due to recurrent SVT this a.m. with rates in the 130s. Unable to see if she was symptomatic secondary to advanced dementia and poor history. If no bradycardia or pauses, plans to consolidate to diltiazem CD 120 daily tomorrow and DC to SNF. Given advanced age and dementia, unlikely to be a good candidate for catheter ablation but may have to consider if SVT not controlled despite medications. Echo without structural heart disease (08/18/13). TSH normal. K and Mg wnl. 2. UTI: Unfortunately no urine culture sent. Initially started on IV Rocephin >changed to by mouth Ceftin and complete total one-week treatment. 3. Advanced dementia: Mental status probably at baseline. 4. Essential hypertension: reasonably controlled. Continue metoprolol and diltiazem. 5. History of chronic diastolic CHF: Continue Lasix. Compensated. 6. Diet controlled DM 2: Well-controlled inpatient. 7. Stage II chronic kidney disease: Creatinine stable   Code Status: Full Family Communication: none at bedside Disposition Plan: Return to SNF possibly 1/19.   Consultants:  Cardiology  Procedures:  None  Antibiotics:  IV Rocephin 1/16>1/18  PO Ceftin 1/18>   Subjective: Denied chest pain, dyspnea or palpitations this morning. Was more concerned about hand tremors. Pleasantly confused.  Objective: Filed Vitals:   01/08/15 0659 01/08/15 1300 01/08/15 2012 01/09/15 0710  BP: 130/67 108/60 114/66 119/58  Pulse: 59 68 73 54   Temp: 98 F (36.7 C) 97.5 F (36.4 C) 97.7 F (36.5 C) 97.9 F (36.6 C)  TempSrc: Oral Oral Oral Oral  Resp: Height:      Weight: 54.477 kg (120 lb 1.6 oz)   53.3 kg (117 lb 8.1 oz)  SpO2: 98% 100% 100% 94%    Intake/Output Summary (Last 24 hours) at 01/09/15 1213 Last data filed at 01/09/15 1017  Gross per 24 hour  Intake    720 ml  Output      0 ml  Net    720 ml   Filed Weights   01/07/15 1625 01/08/15 0659 01/09/15 0710  Weight: 53.751 kg (118 lb 8 oz) 54.477 kg (120 lb 1.6 oz) 53.3 kg (117 lb 8.1 oz)     Exam:  General exam: Pleasant elderly female sitting up in bed seen eating breakfast this morning. No witnessed hand tremors. Respiratory system: Clear/poor inspiratory effort. No increased work of breathing. Cardiovascular system: S1 & S2 heard, RRR. No JVD, murmurs, gallops, clicks or pedal edema. Telemetry: Sinus rhythm in the 60s. A run of NSSVT this am. Gastrointestinal system: Abdomen is nondistended, soft and nontender. Normal bowel sounds heard. Central nervous system: Alert and oriented only to self. No focal neurological deficits. Extremities: Symmetric 5 x 5 power.   Data Reviewed: Basic Metabolic Panel:  Recent Labs Lab 01/07/15 1010 01/08/15 0515  NA 143 140  K 4.6 3.9  CL 106 107  CO2 26 30  GLUCOSE 122* 120*  BUN 23 19  CREATININE 0.96 0.86  CALCIUM 9.6 8.9  MG 2.1  --    Liver Function Tests: No results for input(s): AST, ALT, ALKPHOS, BILITOT, PROT, ALBUMIN in  the last 168 hours. No results for input(s): LIPASE, AMYLASE in the last 168 hours. No results for input(s): AMMONIA in the last 168 hours. CBC:  Recent Labs Lab 01/07/15 1010 01/08/15 0515  WBC 11.0* 6.6  NEUTROABS 9.2*  --   HGB 13.2 12.3  HCT 38.7 37.6  MCV 82.0 82.1  PLT 296 281   Cardiac Enzymes:  Recent Labs Lab 01/07/15 1010  TROPONINI 0.03   BNP (last 3 results)  Recent Labs  05/27/14 1311  PROBNP 214.6   CBG:  Recent Labs Lab  01/08/15 0702 01/08/15 1110 01/08/15 1602 01/08/15 2134 01/09/15 1119  GLUCAP 123* 133* 133* 128* 140*    Recent Results (from the past 240 hour(s))  MRSA PCR Screening     Status: None   Collection Time: 01/07/15  6:48 PM  Result Value Ref Range Status   MRSA by PCR NEGATIVE NEGATIVE Final    Comment:        The GeneXpert MRSA Assay (FDA approved for NASAL specimens only), is one component of a comprehensive MRSA colonization surveillance program. It is not intended to diagnose MRSA infection nor to guide or monitor treatment for MRSA infections.            Studies: No results found.      Scheduled Meds: . aspirin  81 mg Oral Daily  . atorvastatin  10 mg Oral Daily  . cefUROXime  250 mg Oral BID WC  . diltiazem  30 mg Oral 4 times per day  . enoxaparin (LOVENOX) injection  40 mg Subcutaneous Q24H  . furosemide  20 mg Oral QODAY  . metoprolol  100 mg Oral BID  . sodium chloride  3 mL Intravenous Q12H   Continuous Infusions:   Principal Problem:   UTI (urinary tract infection) Active Problems:   Type 2 diabetes mellitus, controlled   Essential hypertension   Diastolic CHF, chronic   Dementia without behavioral disturbance   DM type 2 causing CKD stage 3   PSVT (paroxysmal supraventricular tachycardia)   Altered mental status    Time spent: 25 minutes.    Marcellus ScottHONGALGI,Meredith Altemose, MD, FACP, FHM. [facility] Pager [phone]  If 7PM-7AM, please contact night-coverage [email or web address] Password [email or web address] 01/09/2015, 12:13 PM    LOS: 2 days

## 2015-01-09 NOTE — Evaluation (Signed)
Physical Therapy Evaluation Patient Details Name: [name] MRN: [id number] DOB: 03-29-1920 Today's Date: 01/09/2015   History of Present Illness  81 y.o. female admitted to [facility] on 01/07/15 for Palpitations and was found to have PSVT and UTI.  Pt with significant PMHx of dementia, CVA, CAD, DM 2, HTN, syncope, CKD II, seizures, HA, fibromyalgia, CAD, Diastolic CHF, palpitations, and knee surgeries (L?).    Clinical Impression  Pt is max assist to squat pivot to [facility] and recliner chair.  She was able to complete seated exercises and seems to have significant arthritis in bil knees and shoulders.  She is limited in her mobility, but very willing to participate with PT.  Due to her dementia she is unable to accurately report to me her baseline mobility/ability to walk.  Pt is appropriate for SNF placement at baseline.   PT to follow acutely for deficits listed below.       Follow Up Recommendations SNF    Equipment Recommendations  None recommended by PT    Recommendations for Other Services   NA    Precautions / Restrictions Precautions Precautions: Fall Precaution Comments: Pt needs significant assist for squat pivot transfer      Mobility  Bed Mobility Overal bed mobility: Needs Assistance Bed Mobility: Supine to Sit     Supine to sit: Min assist     General bed mobility comments: Min hand held assist for pt to pull up to sitting EOB.  Pt using bed rail with free hand.   Transfers Overall transfer level: Needs assistance Equipment used: 1 person hand held assist Transfers: Sit to/from [employer] Sit to Stand: Mod assist   Squat pivot transfers: Max assist     General transfer comment: Mod assist to start inital squat with bil hands supported, max assist to actually turn and transfer to the left to [facility] and then to recliner chair.  Initally hands helping with pushing to stand, but when it came to transfer, pt would not let go of support surface, so  had to change to having her hold to therapist instead of pushing up from chair for successful squat pivot transfer.   Ambulation/Gait             General Gait Details: unable with one person assist.  Unsure if she was ambulating at baseline.          Balance Overall balance assessment: Needs assistance Sitting-balance support: Feet supported;No upper extremity supported Sitting balance-Leahy Scale: Good     Standing balance support: Bilateral upper extremity supported Standing balance-Leahy Scale: Zero                               Pertinent Vitals/Pain Pain Assessment: No/denies pain    Home Living Family/patient expects to be discharged to:: Skilled nursing facility (per admission note from SNF, chart lists ALF)                      Prior Function           Comments: pt unable to provide hx     Hand Dominance   Dominant Hand: Left (forced due to multiple right arm injuries per pt)    Extremity/Trunk Assessment   Upper Extremity Assessment: RUE deficits/detail;LUE deficits/detail RUE Deficits / Details: right arm with most significant deficits in active ROM and strength.  Pt with <90 of flexion with associated scapular elevation to compensate.  LUE Deficits / Details: Pt is able to lift left arm above 90 degrees, but not into full ROM (likely somewhere in the 110 degree range agaist gravity.    Lower Extremity Assessment: Generalized weakness (left leg with more significat valgus deformity, at least 3/5)      Cervical / Trunk Assessment: Kyphotic  Communication   Communication: No difficulties  Cognition Arousal/Alertness: Awake/alert Behavior During Therapy: WFL for tasks assessed/performed Overall Cognitive Status: History of cognitive impairments - at baseline (presumed baseline, plesantly confused)                         Exercises General Exercises - Upper Extremity Shoulder Flexion: AROM;Both;10 reps;Seated  (within available ROM) Elbow Flexion: AROM;Both;10 reps General Exercises - Lower Extremity Ankle Circles/Pumps: AROM;Both;10 reps;Seated Long Arc Quad: AROM;Both;10 reps;Seated Hip Flexion/Marching: AROM;Both;10 reps;Seated      Assessment/Plan    PT Assessment Patient needs continued PT services  PT Diagnosis Difficulty walking;Abnormality of gait;Generalized weakness   PT Problem List Decreased strength;Decreased activity tolerance;Decreased balance;Decreased mobility;Decreased cognition;Decreased knowledge of use of DME;Decreased safety awareness;Decreased knowledge of precautions  PT Treatment Interventions DME instruction;Gait training;Stair training;Functional mobility training;Therapeutic activities;Therapeutic exercise;Balance training;Cognitive remediation;Patient/family education;Neuromuscular re-education   PT Goals (Current goals can be found in the Care Plan section) Acute Rehab PT Goals Patient Stated Goal: "to go to the parade" PT Goal Formulation: With patient Time For Goal Achievement: 01/23/15 Potential to Achieve Goals: Good    Frequency Min 2X/week           End of Session Equipment Utilized During Treatment: Gait belt Activity Tolerance: Patient tolerated treatment well Patient left: in chair;with call bell/phone within reach;with chair alarm set           Time: [id number] PT Time Calculation (min) (ACUTE ONLY): 26 min   Charges:   PT Evaluation $Initial PT Evaluation Tier I: 1 Procedure PT Treatments $Therapeutic Activity: 8-22 mins        Charrie Mcconnon B. Ousmane Seeman, PT, DPT [phone]   01/09/2015, 12:41 PM

## 2015-01-09 NOTE — Progress Notes (Signed)
Patient Name: [name] Date of Encounter: 01/09/2015   Principal Problem:   UTI (urinary tract infection) Active Problems:   PSVT (paroxysmal supraventricular tachycardia)   Altered mental status   Type 2 diabetes mellitus, controlled   Essential hypertension   Diastolic CHF, chronic   Dementia without behavioral disturbance   DM type 2 causing CKD stage 3    SUBJECTIVE  No chest pain or sob.  She had recurrent SVT this AM for just a few mins.  Rates into the high 130's.  Broke spontaneously.  She is disoriented to time and place and it's difficult to tell if she was symptomatic this morning or not.  She says that she was not but then asks what causes her HR to elevate.  CURRENT MEDS . aspirin  81 mg Oral Daily  . atorvastatin  10 mg Oral Daily  . cefTRIAXone (ROCEPHIN)  IV  1 g Intravenous Q24H  . diltiazem  30 mg Oral 3 times per day  . enoxaparin (LOVENOX) injection  40 mg Subcutaneous Q24H  . furosemide  20 mg Oral QODAY  . metoprolol  100 mg Oral BID  . sodium chloride  3 mL Intravenous Q12H    OBJECTIVE  Filed Vitals:   01/08/15 0659 01/08/15 1300 01/08/15 2012 01/09/15 0710  BP: 130/67 108/60 114/66 119/58  Pulse: 59 68 73 54  Temp: 98 F (36.7 C) 97.5 F (36.4 C) 97.7 F (36.5 C) 97.9 F (36.6 C)  TempSrc: Oral Oral Oral Oral  Resp: Height:      Weight: 120 lb 1.6 oz (54.477 kg)   117 lb 8.1 oz (53.3 kg)  SpO2: 98% 100% 100% 94%    Intake/Output Summary (Last 24 hours) at 01/09/15 0806 Last data filed at 01/08/15 1700  Gross per 24 hour  Intake    480 ml  Output      0 ml  Net    480 ml   Filed Weights   01/07/15 1625 01/08/15 0659 01/09/15 0710  Weight: 118 lb 8 oz (53.751 kg) 120 lb 1.6 oz (54.477 kg) 117 lb 8.1 oz (53.3 kg)    PHYSICAL EXAM  General: Pleasant, NAD. Neuro: Alert and oriented to person only. Moves all extremities spontaneously. Psych: Normal affect. HEENT:  Normal  Neck: Supple without bruits or  JVD. Lungs:  Resp regular and unlabored, CTA. Heart: RRR no s3, s4, or murmurs. Abdomen: Soft, non-tender, non-distended, BS + x 4.  Extremities: No clubbing, cyanosis or edema. DP/PT/Radials 2+ and equal bilaterally.  Accessory Clinical Findings  CBC  Recent Labs  01/07/15 1010 01/08/15 0515  WBC 11.0* 6.6  NEUTROABS 9.2*  --   HGB 13.2 12.3  HCT 38.7 37.6  MCV 82.0 82.1  PLT 296 281   Basic Metabolic Panel  Recent Labs  01/07/15 1010 01/08/15 0515  NA 143 140  K 4.6 3.9  CL 106 107  CO2 26 30  GLUCOSE 122* 120*  BUN 23 19  CREATININE 0.96 0.86  CALCIUM 9.6 8.9  MG 2.1  --    Cardiac Enzymes  Recent Labs  01/07/15 1010  TROPONINI 0.03   Thyroid Function Tests  Recent Labs  01/08/15 0515  TSH 0.570    TELE  RSR, 60, no significant pauses  SVT this AM @ 138 bpm  broke spontaneously.  Radiology/Studies  Dg Chest Port 1 View  01/07/2015   CLINICAL DATA:  Tachycardia.  Chest pain.  EXAM: PORTABLE CHEST -  1 VIEW  COMPARISON:  05/27/2014  FINDINGS: Heart size is within normal limits. Both lungs remain clear. No evidence of pleural effusion. Mild thoracolumbar scoliosis again noted.  IMPRESSION: No active disease.   Electronically Signed   By: Myles RosenthalJohn  Stahl M.D.   On: 01/07/2015 11:25   ASSESSMENT AND PLAN  1.  PSVT:  In the setting of UTI/AMS. Now on metoprolol 100 mg bid and dilt 30 q8h.  Baseline rhythm/rate - sinus @ 60.  Had recurrent SVT this AM with rate climbing to 138 (was in 170's on 1/16).  Not clear if she was symptomatic or not but clearly states that she has been symptomatic in the past with this.  Will push dilt to 30 q6h today.  Will have to watch for sinus brady/pauses.  If stable today, could consolidate to dilt cd 120 tomorrow @ d/c.  Given advanced age/dementia, she is unlikely to be a good candidate for catheter ablation, though may need to consider it vs addition of antiarrhythmic therapy if she continues to have runs of symptomatic SVT  despite titration of bb/dilt.  2.  UTI:  Abx per IM.  3.  AMS:  Stable in setting of underlying dementia.  4.  Chronic diastolic CHF:  euvolemic on exam.  HR/BP generally well controlled.  Signed, Nicolasa Duckinghristopher Berge NP   History and all data above reviewed.  Patient examined.  She is very confused.  She does not seem to have any SOB or pain.  I agree with the findings as above.  The patient exam reveals COR:RRR  ,  Lungs: Decreased breath sounds  ,  Abd: Positive bowel sounds, no rebound no guarding, Ext No edema  .  All available labs, radiology testing, previous records reviewed. Agree with documented assessment and plan. SVT:  Plan to increase Cardizem as above.    Rollene RotundaJames Najai Waszak  8:58 AM  01/09/2015

## 2015-01-10 MED ORDER — LORAZEPAM 0.5 MG PO TABS: ORAL_TABLET | ORAL | Status: DC

## 2015-01-10 MED ORDER — DILTIAZEM HCL ER COATED BEADS 120 MG PO CP24
120.0000 mg | ORAL_CAPSULE | Freq: Every day | ORAL | Status: DC
  Administered 2015-01-10: 120 mg via ORAL
  Filled 2015-01-10: qty 1

## 2015-01-10 MED ORDER — LORAZEPAM 0.5 MG PO TABS
0.5000 mg | ORAL_TABLET | Freq: Four times a day (QID) | ORAL | Status: DC
Start: 2015-01-10 — End: 2015-01-10

## 2015-01-10 MED ORDER — DILTIAZEM HCL ER COATED BEADS 120 MG PO CP24
120.0000 mg | ORAL_CAPSULE | Freq: Every day | ORAL | Status: DC
Start: 2015-01-11 — End: 2015-09-25

## 2015-01-10 MED ORDER — LORAZEPAM 0.5 MG PO TABS: 0.5000 mg | ORAL_TABLET | Freq: Four times a day (QID) | ORAL | Status: DC

## 2015-01-10 MED ORDER — CEFUROXIME AXETIL 250 MG PO TABS: 250.0000 mg | ORAL_TABLET | Freq: Two times a day (BID) | ORAL | Status: DC

## 2015-01-10 NOTE — Care Management Note (Addendum)
    Page 1 of 1   01/10/2015     4:21:43 PM CARE MANAGEMENT NOTE 01/10/2015  Patient:  [name],[name]   Account Number:  [id number]  Date Initiated:  01/09/2015  Documentation initiated by:  Jaelen Gellerman  Subjective/Objective Assessment:   Pt adm on 01/07/15 with palpitations, UTI.  PTA, [facility].     Action/Plan:   CSW consulted to facilitate return to SNF when medically stable for dc.   Anticipated DC Date:  01/10/2015   Anticipated DC Plan:  SKILLED NURSING FACILITY  In-house referral  Clinical Social Worker      DC Planning Services  CM consult      Choice offered to / List presented to:             Status of service:  Completed, signed off Medicare Important Message given?  YES (If response is "NO", the following Medicare IM given date fields will be blank) Date Medicare IM given:  01/10/2015 Medicare IM given by:  Rue Valladares Date Additional Medicare IM given:   Additional Medicare IM given by:    Discharge Disposition:  SKILLED NURSING FACILITY  Per UR Regulation:  Reviewed for med. necessity/level of care/duration of stay  If discussed at Long Length of Stay Meetings, dates discussed:    Comments:  01/10/15 Sidney AceJulie Oree Mirelez, RN, BSN [phone] Pt discharged to SNF today, per CSW arrangements.

## 2015-01-10 NOTE — Progress Notes (Signed)
RN called [facility], confirmed that pt had current flu vaccine 09-26-14.

## 2015-01-10 NOTE — Progress Notes (Signed)
Ok per MD for d/c today back [facility] for continued care.  CSW notified patient's legal guardian- Meredith RadonHeather Fuentes- [place] DSS of above.  Nursing notified to call report and [facility]  CSW spoke with patient and she is aware/agreeable to return to the facility. CSW signing off.  [name], [place] [phone]

## 2015-01-10 NOTE — Discharge Instructions (Signed)
Supraventricular Tachycardia °Supraventricular tachycardia (SVT) is an abnormal heart rhythm (arrhythmia) that causes the heart to beat very fast (tachycardia). This kind of fast heartbeat originates in the upper chambers of the heart (atria). SVT can cause the heart to beat greater than 100 beats per minute. SVT can have a rapid burst of heartbeats. This can start and stop suddenly without warning and is called nonsustained. SVT can also be sustained, in which the heart beats at a continuous fast rate.  °CAUSES  °There can be different causes of SVT. Some of these include: °· Heart valve problems such as mitral valve prolapse. °· An enlarged heart (hypertrophic cardiomyopathy). °· Congenital heart problems. °· Heart inflammation (pericarditis). °· Hyperthyroidism. °· Low potassium or magnesium levels. °· Caffeine. °· Drug use such as cocaine, methamphetamines, or stimulants. °· Some over-the-counter medicines such as: °¨ Decongestants. °¨ Diet medicines. °¨ Herbal medicines. °SYMPTOMS  °Symptoms of SVT can vary. Symptoms depend on whether the SVT is sustained or nonsustained. You may experience: °· No symptoms (asymptomatic). °· An awareness of your heart beating rapidly (palpitations). °· Shortness of breath. °· Chest pain or pressure. °If your blood pressure drops because of the SVT, you may experience: °· Fainting or near fainting. °· Weakness. °· Dizziness. °DIAGNOSIS  °Different tests can be performed to diagnose SVT, such as: °· An electrocardiogram (EKG). This is a painless test that records the electrical activity of your heart. °· Holter monitor. This is a 24 hour recording of your heart rhythm. You will be given a diary. Write down all symptoms that you have and what you were doing at the time you experienced symptoms. °· Arrhythmia monitor. This is a small device that your wear for several weeks. It records the heart rhythm when you have symptoms. °· Echocardiogram. This is an imaging test to help detect  abnormal heart structure such as congenital abnormalities, heart valve problems, or heart enlargement. °· Stress test. This test can help determine if the SVT is related to exercise. °· Electrophysiology study (EPS). This is a procedure that evaluates your heart's electrical system and can help your caregiver find the cause of your SVT. °TREATMENT  °Treatment of SVT depends on the symptoms, how often it recurs, and whether there are any underlying heart problems.  °· If symptoms are rare and no other cardiac disease is present, no treatment may be needed. °· Blood work may be done to check potassium, magnesium, and thyroid hormone levels to see if they are abnormal. If these levels are abnormal, treatment to correct the problems will occur. °Medicines °Your caregiver may use oral medicines to treat SVT. These medicines are given for long-term control of SVT. Medicines may be used alone or in combination with other treatments. These medicines work to slow nerve impulses in the heart muscle. These medicines can also be used to treat high blood pressure. Some of these medicines may include: °· Calcium channel blockers. °· Beta blockers. °· Digoxin. °Nonsurgical procedures °Nonsurgical techniques may be used if oral medicines do not work. Some examples include: °· Cardioversion. This technique uses either drugs or an electrical shock to restore a normal heart rhythm. °¨ Cardioversion drugs may be given through an intravenous (IV) line to help "reset" the heart rhythm. °¨ In electrical cardioversion, the caregiver shocks your heart to stop its beat for a split second. This helps to reset the heart to a normal rhythm. °· Ablation. This procedure is done under mild sedation. High frequency radio wave energy is used to   destroy the area of heart tissue responsible for the SVT. °HOME CARE INSTRUCTIONS  °· Do not smoke. °· Only take medicines prescribed by your caregiver. Check with your caregiver before using over-the-counter  medicines. °· Check with your caregiver about how much alcohol and caffeine (coffee, tea, colas, or chocolate) you may have. °· It is very important to keep all follow-up referrals and appointments in order to properly manage this problem. °SEEK IMMEDIATE MEDICAL CARE IF: °· You have dizziness. °· You faint or nearly faint. °· You have shortness of breath. °· You have chest pain or pressure. °· You have sudden nausea or vomiting. °· You have profuse sweating. °· You are concerned about how long your symptoms last. °· You are concerned about the frequency of your SVT episodes. °If you have the above symptoms, call your local emergency services (911 in [place] immediately. Do not drive yourself to the hospital. °MAKE SURE YOU:  °· Understand these instructions. °· Will watch your condition. °· Will get help right away if you are not doing well or get worse. °Document Released: 12/09/2005 Document Revised: 03/02/2012 Document Reviewed: 03/23/2009 °[employer]® Patient Information ©2015 [employer]. This information is not intended to replace advice given to you by your health care provider. Make sure you discuss any questions you have with your health care provider. ° °

## 2015-01-10 NOTE — Progress Notes (Signed)
Pt to discharge via [facility] to SNF. IV removed, site WNL. Report called to facility.

## 2015-01-10 NOTE — Discharge Summary (Signed)
Physician Discharge Summary  CHESNEY SUARES ZOX:[id number] DOB: 12/31/1919 DOA: 01/07/2015  PCP: Marikay Alar, MD  Admit date: 01/07/2015 Discharge date: 01/10/2015  Time spent: Less than 30 minutes  Recommendations for Outpatient Follow-up:  1. M.D. at SNF in 3 days. 2. Dr. Rollene Rotunda, Cardiology-as needed.  Discharge Diagnoses:  Principal Problem:   UTI (urinary tract infection) Active Problems:   Type 2 diabetes mellitus, controlled   Essential hypertension   Diastolic CHF, chronic   Dementia without behavioral disturbance   DM type 2 causing CKD stage 3   PSVT (paroxysmal supraventricular tachycardia)   Altered mental status   Discharge Condition: Improved & Stable  Diet recommendation: Heart healthy diet.  Filed Weights   01/08/15 0659 01/09/15 0710 01/10/15 0559  Weight: 54.477 kg (120 lb 1.6 oz) 53.3 kg (117 lb 8.1 oz) 51.4 kg (113 lb 5.1 oz)    History of present illness:  81 year old female patient with history of dementia, CVA, CAD, DM 2, HTN, nursing home resident, admitted with palpitations and found to have PSVT precipitated by UTI. Cardiology following.  Hospital Course:    PSVT: Long history of symptomatic palpitations. On Lopressor 100 MG twice a day at home. Cardiology initiated and titrated diltiazem 30 MG qid on 1/18 due to recurrent SVT. Significantly improved with only a very brief episode of NSSVT this morning and cardiology has consolidated to diltiazem CD 120 daily and have cleared her for discharge to SNF. Given advanced age and dementia, unlikely to be a good candidate for catheter ablation but may have to consider if SVT not controlled despite medications. Echo without structural heart disease (08/18/13). TSH normal. K and Mg wnl.  UTI: Unfortunately no urine culture sent. Initially started on IV Rocephin >changed to by mouth Ceftin and complete total one-week treatment on 01/13/15.  Advanced dementia: Mental status probably at baseline.  Resume Namenda. Medication reconciliation was redone today. It was noticed that patient is on chronic benzodiazepines which was inadvertently held in the hospital. Will give her a dose of Ativan prior to discharge and continue prior home dose.  Essential hypertension: reasonably controlled. Continue metoprolol and diltiazem. Diovan discontinued due to initiation of diltiazem and blood pressures reasonably controlled on metoprolol and diltiazem.  History of chronic diastolic CHF: Continue Lasix. Compensated.  Diet controlled DM 2: Well-controlled inpatient.  Stage II chronic kidney disease: Creatinine stable  Chronic benzodiazepine use: See above  Consultants:  Cardiology  Procedures:  None   Discharge Exam:  Complaints:  Denies palpitations. Denies any other complaints.  Filed Vitals:   01/09/15 1855 01/09/15 2015 01/10/15 0006 01/10/15 0559  BP: 131/59 138/73 129/64 124/66  Pulse: 74 72  70  Temp:  97.6 F (36.4 C)  98.8 F (37.1 C)  TempSrc:  Oral  Oral  Resp: Height:      Weight:    51.4 kg (113 lb 5.1 oz)  SpO2: 97% 95%  95%    General exam: Pleasant elderly female sitting up in bed without distress. Respiratory system: Clear/poor inspiratory effort. No increased work of breathing. Cardiovascular system: S1 & S2 heard, RRR. No JVD, murmurs, gallops, clicks or pedal edema. Telemetry: Sinus rhythm in the 60s-70's. A brief 5 second run of NSSVT this AM. No bradycardia or pauses. Gastrointestinal system: Abdomen is nondistended, soft and nontender. Normal bowel sounds heard. Central nervous system: Alert and oriented only to self. No focal neurological deficits. Extremities: Symmetric 5 x 5 power.  Discharge Instructions  Discharge Instructions    Call MD for:    Complete by:  As directed   Palpitations/heart racing.     Diet - low sodium heart healthy    Complete by:  As directed      Increase activity slowly    Complete by:  As directed              Medication List    STOP taking these medications        dextromethorphan-guaiFENesin 30-600 MG per 12 hr tablet  Commonly known as:  MUCINEX DM     valsartan 40 MG tablet  Commonly known as:  DIOVAN      TAKE these medications        acetaminophen 500 MG tablet  Commonly known as:  TYLENOL  Take 500 mg by mouth every 8 (eight) hours.     aspirin 81 MG tablet  Take 81 mg by mouth daily.     atorvastatin 10 MG tablet  Commonly known as:  LIPITOR  Take 10 mg by mouth daily.     calcium carbonate 600 MG Tabs tablet  Commonly known as:  OS-CAL  Take 600 mg by mouth 2 (two) times daily with a meal.     Capsaicin 0.1 % Crea  Apply 1 application topically 3 (three) times daily.     cefUROXime 250 MG tablet  Commonly known as:  CEFTIN  Take 1 tablet (250 mg total) by mouth 2 (two) times daily with a meal. Discontinue after 01/13/15 doses.     diltiazem 120 MG 24 hr capsule  Commonly known as:  CARDIZEM CD  Take 1 capsule (120 mg total) by mouth daily.  Start taking on:  01/11/2015     ferrous sulfate 325 (65 FE) MG tablet  Take 325 mg by mouth daily with breakfast.     furosemide 20 MG tablet  Commonly known as:  LASIX  Take 20 mg by mouth every other day.     LORazepam 0.5 MG tablet  Commonly known as:  ATIVAN  Take one tablet by mouth four times daily     Melatonin 3 MG Caps  Take 3 mg by mouth at bedtime.     meloxicam 7.5 MG tablet  Commonly known as:  MOBIC  Take 7.5 mg by mouth daily.     metoprolol 100 MG tablet  Commonly known as:  LOPRESSOR  Take 1 tablet (100 mg total) by mouth 2 (two) times daily.     NAMENDA XR 14 MG Cp24 24 hr capsule  Generic drug:  memantine  Take 1 tablet by mouth daily.     polyethylene glycol packet  Commonly known as:  MIRALAX / GLYCOLAX  Take 17 g by mouth daily.     PRESCRIPTION MEDICATION  Take 120 mLs by mouth 2 (two) times daily. Med Pass NSA     traZODone 50 MG tablet  Commonly known as:  DESYREL   Take 50 mg by mouth at bedtime as needed.     vitamin B-12 1000 MCG tablet  Commonly known as:  CYANOCOBALAMIN  Take 1,000 mcg by mouth daily.          The results of significant diagnostics from this hospitalization (including imaging, microbiology, ancillary and laboratory) are listed below for reference.    Significant Diagnostic Studies: Dg Chest Port 1 View  01/07/2015   CLINICAL DATA:  Tachycardia.  Chest pain.  EXAM: PORTABLE CHEST - 1 VIEW  COMPARISON:  05/27/2014  FINDINGS:  Heart size is within normal limits. Both lungs remain clear. No evidence of pleural effusion. Mild thoracolumbar scoliosis again noted.  IMPRESSION: No active disease.   Electronically Signed   By: Myles RosenthalJohn  Stahl M.D.   On: 01/07/2015 11:25    Microbiology: Recent Results (from the past 240 hour(s))  MRSA PCR Screening     Status: None   Collection Time: 01/07/15  6:48 PM  Result Value Ref Range Status   MRSA by PCR NEGATIVE NEGATIVE Final    Comment:        The GeneXpert MRSA Assay (FDA approved for NASAL specimens only), is one component of a comprehensive MRSA colonization surveillance program. It is not intended to diagnose MRSA infection nor to guide or monitor treatment for MRSA infections.      Labs: Basic Metabolic Panel:  Recent Labs Lab 01/07/15 1010 01/08/15 0515  NA 143 140  K 4.6 3.9  CL 106 107  CO2 26 30  GLUCOSE 122* 120*  BUN 23 19  CREATININE 0.96 0.86  CALCIUM 9.6 8.9  MG 2.1  --    Liver Function Tests: No results for input(s): AST, ALT, ALKPHOS, BILITOT, PROT, ALBUMIN in the last 168 hours. No results for input(s): LIPASE, AMYLASE in the last 168 hours. No results for input(s): AMMONIA in the last 168 hours. CBC:  Recent Labs Lab 01/07/15 1010 01/08/15 0515  WBC 11.0* 6.6  NEUTROABS 9.2*  --   HGB 13.2 12.3  HCT 38.7 37.6  MCV 82.0 82.1  PLT 296 281   Cardiac Enzymes:  Recent Labs Lab 01/07/15 1010  TROPONINI 0.03   BNP: BNP (last 3  results)  Recent Labs  05/27/14 1311  PROBNP 214.6   CBG:  Recent Labs Lab 01/08/15 1110 01/08/15 1602 01/08/15 2134 01/09/15 1119 01/09/15 1635  GLUCAP 133* 133* 128* 140* 160*    Discussed with Ms. Lenor CoffinAnnie Brown, friend, updated care and answered questions.    Signed:  Marcellus ScottHONGALGI,Jaleea Alesi, MD, FACP, FHM. [facility] Pager [phone]  If 7PM-7AM, please contact night-coverage [email or web address] Password [email or web address] 01/10/2015, 1:31 PM

## 2015-01-10 NOTE — Progress Notes (Signed)
Patient Name: [name] Date of Encounter: 01/10/2015   Principal Problem:   UTI (urinary tract infection) Active Problems:   PSVT (paroxysmal supraventricular tachycardia)   Altered mental status   Type 2 diabetes mellitus, controlled   Essential hypertension   Diastolic CHF, chronic   Dementia without behavioral disturbance   DM type 2 causing CKD stage 3   SUBJECTIVE  No chest pain or SOB. [name] had a few seconds of SVT this AM. Rates were from 121 to 147  with PVCs. [name] is disoriented to time and place so it's difficult to know if [name] was symptomatic. When asked if [name] ever felt like her heart was racing [name] does say yes but unable to determine exactly when that was.    CURRENT MEDS . aspirin  81 mg Oral Daily  . atorvastatin  10 mg Oral Daily  . cefUROXime  250 mg Oral BID WC  . diltiazem  30 mg Oral 4 times per day  . enoxaparin (LOVENOX) injection  40 mg Subcutaneous Q24H  . furosemide  20 mg Oral QODAY  . metoprolol  100 mg Oral BID  . sodium chloride  3 mL Intravenous Q12H    OBJECTIVE  Filed Vitals:   01/09/15 1855 01/09/15 2015 01/10/15 0006 01/10/15 0559  BP: 131/59 138/73 129/64 124/66  Pulse: 74 72  70  Temp:  97.6 F (36.4 C)  98.8 F (37.1 C)  TempSrc:  Oral  Oral  Resp: 18 18  17   Height:      Weight:    113 lb 5.1 oz (51.4 kg)  SpO2: 97% 95%  95%    Intake/Output Summary (Last 24 hours) at 01/10/15 0754 Last data filed at 01/10/15 0204  Gross per 24 hour  Intake    840 ml  Output    300 ml  Net    540 ml   Filed Weights   01/08/15 0659 01/09/15 0710 01/10/15 0559  Weight: 120 lb 1.6 oz (54.477 kg) 117 lb 8.1 oz (53.3 kg) 113 lb 5.1 oz (51.4 kg)    PHYSICAL EXAM  General: Pleasant, NAD. Neuro: Alert and oriented to person only. Moves all extremities spontaneously. Psych: Normal affect. HEENT:  Normal  Neck: Supple without bruits or JVD. Lungs:  Resp regular and unlabored, CTA. Heart: RRR no s3, s4.  2/6 SEM heard  throughout. Abdomen: Soft, non-tender, distended, BS + x 4.  Extremities: No clubbing, cyanosis or edema. DP/PT/Radials 2+ and equal bilaterally.  Accessory Clinical Findings  CBC  Recent Labs  01/07/15 1010 01/08/15 0515  WBC 11.0* 6.6  NEUTROABS 9.2*  --   HGB 13.2 12.3  HCT 38.7 37.6  MCV 82.0 82.1  PLT 296 281   Basic Metabolic Panel  Recent Labs  01/07/15 1010 01/08/15 0515  NA 143 140  K 4.6 3.9  CL 106 107  CO2 26 30  GLUCOSE 122* 120*  BUN 23 19  CREATININE 0.96 0.86  CALCIUM 9.6 8.9  MG 2.1  --    Cardiac Enzymes  Recent Labs  01/07/15 1010  TROPONINI 0.03   Thyroid Function Tests  Recent Labs  01/08/15 0515  TSH 0.570   TELE  RSR, 78, Tachycardia rates 121 to 147 this AM for a few seconds.   Radiology/Studies  Dg Chest Port 1 View  01/07/2015   CLINICAL DATA:  Tachycardia.  Chest pain.  EXAM: PORTABLE CHEST - 1 VIEW  COMPARISON:  05/27/2014  FINDINGS: Heart size is within normal  limits. Both lungs remain clear. No evidence of pleural effusion. Mild thoracolumbar scoliosis again noted.  IMPRESSION: No active disease.   Electronically Signed   By: Myles Rosenthal M.D.   On: 01/07/2015 11:25   ASSESSMENT AND PLAN  1. PSVT: Currently on diltiazem 30 mg q6h and metoprolol 100 mg BID. Had minimal recurrent SVT - only a few seconds this AM.  OTW did well yesterday.  No evidence of significant brady or heart block.  As planned yesterday, will consolidate dilt to CD 120 mg daily.  Cont bb.   2. UTI: Abx per IM.  3. AMS: Stable in setting of underlying dementia.  4. Chronic diastolic CHF: euvolemic on exam. HR/BP well controlled.  Signed, Nicolasa Ducking NP   History and all data above reviewed.  Patient examined.  I agree with the findings as above.   [name] denies any chest pain.  Pleasantly confused.  No SOB.  The patient exam reveals COR:RRR  ,  Lungs: Clear  ,  Abd: Positive bowel sounds, no rebound no guarding, Ext No edema  .  All available  labs, radiology testing, previous records reviewed. Agree with documented assessment and plan. SVT:  Consolidate Cardizem as above.    Fayrene Fearing Frederick Klinger  12:01 PM  01/10/2015

## 2015-01-15 ENCOUNTER — Non-Acute Institutional Stay (SKILLED_NURSING_FACILITY): Payer: Medicare Other | Admitting: Internal Medicine

## 2015-01-15 DIAGNOSIS — I10 Essential (primary) hypertension: Secondary | ICD-10-CM | POA: Diagnosis not present

## 2015-01-15 DIAGNOSIS — F0390 Unspecified dementia without behavioral disturbance: Secondary | ICD-10-CM

## 2015-01-15 DIAGNOSIS — I471 Supraventricular tachycardia, unspecified: Secondary | ICD-10-CM

## 2015-01-15 DIAGNOSIS — N3000 Acute cystitis without hematuria: Secondary | ICD-10-CM | POA: Diagnosis not present

## 2015-01-15 DIAGNOSIS — E785 Hyperlipidemia, unspecified: Secondary | ICD-10-CM

## 2015-01-15 DIAGNOSIS — I5032 Chronic diastolic (congestive) heart failure: Secondary | ICD-10-CM

## 2015-01-15 DIAGNOSIS — E119 Type 2 diabetes mellitus without complications: Secondary | ICD-10-CM

## 2015-01-15 NOTE — Progress Notes (Signed)
MRN: [id number] Name: [name]  Sex: female Age: 81 y.o. DOB: 1920-01-23  PSC #: [name] Facility/Room: 127 Level Of Care: SNF Provider: Merrilee Seashore D Emergency Contacts: Extended Emergency Contact Information Primary Emergency Contact: [name],[name] [address] [place] [place] Home Phone: [phone] Work Phone: [phone] Relation: Other  Code Status: FULL  Allergies: Codeine and Penicillins  Chief Complaint  Patient presents with  . New Admit To SNF    HPI: Patient is 81 y.o. female who was admitted to hospital for PSVT, mostly resolved, admitted to SNF as residence.  Past Medical History  Diagnosis Date  . CVA (cerebral vascular accident)     a. She's not sure that this is true.  [name] CAD (coronary artery disease)     a. reports prior h/o MI's with possible cath @ [facility] in [place] 'many years ago.'  . Vitamin D deficiency   . Diabetes mellitus, type 2   . HTN (hypertension)   . Tremor, essential   . Urinary incontinence   . High risk social situations 11/28/2011  . Intention tremor 03/03/2007    Qualifier: Diagnosis of  By: Dorathy Daft  MD, IDAYLIS    . Syncope, near 02/28/2012  . Altered mental status 01/01/2011    Qualifier: Diagnosis of  By: Earnest Bailey MD, Selena Batten    . DEFICIENCY, VITAMIN D NOS 07/16/2007    Qualifier: Diagnosis of  By: Irving Burton MD, Clifton Custard    . Asthma   . History of pneumonia   . Recurrent upper respiratory infection (URI)   . Hyperthyroidism   . Anemia   . CKD (chronic kidney disease), stage II   . H/O hiatal hernia   . Seizures   . Headache(784.0)   . Neuromuscular disorder   . Arthritis   . Fibromyalgia   . CAROTID ARTERY DISEASE 07/16/2007    a. 07/2012 U/S: no signif extracranial carotid artery stenosis, antegrad vertebral flow.  . Diastolic CHF, chronic     a. 07/2013 Echo: EF 55-60%.  . TOBACCO USER   . Palpitations     a. 8.2014 Echo: EF 55-60%, no rwma, mod LVH, mildly dil LA, PASP .  [name]  Systolic murmur     a. no significant valvular abnormalities on echo 8.2014.  [name] HLD (hyperlipidemia)     Past Surgical History  Procedure Laterality Date  . Appendectomy  1965  . Vaginal hysterectomy  1965  . Knee surgery  1989, 1995      Medication List       This list is accurate as of: 01/15/15 11:59 PM.  Always use your most recent med list.               acetaminophen 500 MG tablet  Commonly known as:  TYLENOL  Take 500 mg by mouth every 8 (eight) hours.     aspirin 81 MG tablet  Take 81 mg by mouth daily.     atorvastatin 10 MG tablet  Commonly known as:  LIPITOR  Take 10 mg by mouth daily.     calcium carbonate 600 MG Tabs tablet  Commonly known as:  OS-CAL  Take 600 mg by mouth 2 (two) times daily with a meal.     Capsaicin 0.1 % Crea  Apply 1 application topically 3 (three) times daily.     cefUROXime 250 MG tablet  Commonly known as:  CEFTIN  Take 1 tablet (250 mg total) by mouth 2 (two) times  daily with a meal. Discontinue after 01/13/15 doses.     diltiazem 120 MG 24 hr capsule  Commonly known as:  CARDIZEM CD  Take 1 capsule (120 mg total) by mouth daily.     ferrous sulfate 325 (65 FE) MG tablet  Take 325 mg by mouth daily with breakfast.     furosemide 20 MG tablet  Commonly known as:  LASIX  Take 20 mg by mouth every other day.     LORazepam 0.5 MG tablet  Commonly known as:  ATIVAN  Take one tablet by mouth four times daily     Melatonin 3 MG Caps  Take 3 mg by mouth at bedtime.     meloxicam 7.5 MG tablet  Commonly known as:  MOBIC  Take 7.5 mg by mouth daily.     metoprolol 100 MG tablet  Commonly known as:  LOPRESSOR  Take 1 tablet (100 mg total) by mouth 2 (two) times daily.     NAMENDA XR 14 MG Cp24 24 hr capsule  Generic drug:  memantine  Take 1 tablet by mouth daily.     polyethylene glycol packet  Commonly known as:  MIRALAX / GLYCOLAX  Take 17 g by mouth daily.     PRESCRIPTION MEDICATION  Take 120 mLs by mouth 2  (two) times daily. Med Pass NSA     traZODone 50 MG tablet  Commonly known as:  DESYREL  Take 50 mg by mouth at bedtime as needed.     vitamin B-12 1000 MCG tablet  Commonly known as:  CYANOCOBALAMIN  Take 1,000 mcg by mouth daily.        No orders of the defined types were placed in this encounter.    Immunization History  Administered Date(s) Administered  . Pneumococcal Polysaccharide-23 09/22/1994  . Td 05/23/2004    History  Substance Use Topics  . Smoking status: Current Some Day Smoker -- 0.50 packs/day    Types: Cigarettes    Last Attempt to Quit: 04/06/2012  . Smokeless tobacco: Never Used     Comment: does not smoke much...  . Alcohol Use: No    Family history is noncontributory    Review of Systems  DATA OBTAINED: from patient, nurse, medical record GENERAL:  no fevers, fatigue, appetite changes SKIN: No itching, rash  EYES: No eye pain, redness, discharge EARS: No earache, tinnitus, change in hearing NOSE: No congestion, drainage or bleeding  MOUTH/THROAT: No mouth or tooth pain, No sore throat RESPIRATORY: No cough, wheezing, SOB CARDIAC: No chest pain, palpitations, lower extremity edema  GI: No abdominal pain, No N/V/D or constipation, No heartburn or reflux  GU: No dysuria, frequency or urgency, or incontinence  MUSCULOSKELETAL: occ joint pain NEUROLOGIC: No headache, dizziness or focal weakness PSYCHIATRIC: No overt anxiety or sadness, No behavior issue.   Filed Vitals:   01/15/15 2050  BP: 119/58  Pulse: 66  Temp: 98.7 F (37.1 C)  Resp: 18    Physical Exam  GENERAL APPEARANCE: Alert, conversant,  No acute distress.  SKIN: No diaphoresis rash HEAD: Normocephalic, atraumatic  EYES: Conjunctiva/lids clear. Pupils round, reactive. EOMs intact.  EARS: External exam WNL, canals clear. Hearing grossly normal.  NOSE: No deformity or discharge.  MOUTH/THROAT: Lips w/o lesions  RESPIRATORY: Breathing is even, unlabored. Lung sounds are  clear   CARDIOVASCULAR: Heart RRR no murmurs, rubs or gallops. No peripheral edema.   GASTROINTESTINAL: Abdomen is soft, non-tender, not distended w/ normal bowel sounds. GENITOURINARY: Bladder non tender, not  distended  MUSCULOSKELETAL: No abnormal joints or musculature NEUROLOGIC:  Cranial nerves 2-12 grossly intact. Moves all extremities  PSYCHIATRIC: Mood and affect appropriate to situation, no behavioral issues  Patient Active Problem List   Diagnosis Date Noted  . Altered mental status 01/09/2015  . UTI (urinary tract infection) 01/09/2015  . SVT (supraventricular tachycardia) 01/07/2015  . DM type 2 causing CKD stage 3 01/07/2015  . PSVT (paroxysmal supraventricular tachycardia) 01/07/2015  . Constipation 12/27/2014  . Anemia, iron deficiency 10/06/2014  . Dementia without behavioral disturbance 07/18/2014  . Palpitations 08/17/2013  . Chest discomfort 08/17/2013  . Trigeminy 08/17/2013  . Toenail deformity 04/30/2013  . Memory loss 09/24/2012  . Insomnia 03/12/2012  . Mobility impaired 01/07/2012  . Diastolic CHF, chronic 12/31/2011  . CAD (coronary artery disease) 12/28/2011  . TOBACCO USER 12/25/2009  . Abnormality of gait 07/20/2009  . Osteoporosis 10/01/2007  . Essential hypertension 08/07/2007  . Type 2 diabetes mellitus, controlled 07/16/2007  . CAROTID ARTERY DISEASE 07/16/2007  . C V A/STROKE 07/16/2007  . URINARY INCONTINENCE, URGE 07/16/2007  . HX, PERSONAL, THROMBOPHLEBITIS 07/16/2007  . Hyperlipidemia with target LDL less than 70 03/03/2007  . ARTHROPATHY NEC, SHOULDER 03/03/2007  . Osteoarthritis 03/03/1979    CBC    Component Value Date/Time   WBC 6.6 01/08/2015 0515   RBC 4.58 01/08/2015 0515   HGB 12.3 01/08/2015 0515   HCT 37.6 01/08/2015 0515   PLT 281 01/08/2015 0515   MCV 82.1 01/08/2015 0515   LYMPHSABS 0.8 01/07/2015 1010   MONOABS 0.9 01/07/2015 1010   EOSABS 0.0 01/07/2015 1010   BASOSABS 0.1 01/07/2015 1010    CMP      Component Value Date/Time   NA 140 01/08/2015 0515   K 3.9 01/08/2015 0515   CL 107 01/08/2015 0515   CO2 30 01/08/2015 0515   GLUCOSE 120* 01/08/2015 0515   BUN 19 01/08/2015 0515   CREATININE 0.86 01/08/2015 0515   CREATININE 0.86 04/29/2013 1605   CALCIUM 8.9 01/08/2015 0515   PROT 7.3 12/03/2013 1952   ALBUMIN 3.3* 12/03/2013 1952   AST 18 12/03/2013 1952   ALT 15 12/03/2013 1952   ALKPHOS 95 12/03/2013 1952   BILITOT 0.1* 12/03/2013 1952   GFRNONAA 57* 01/08/2015 0515   GFRAA 66* 01/08/2015 0515    Assessment and Plan  PSVT (paroxysmal supraventricular tachycardia)  Long history of symptomatic palpitations. On Lopressor 100 MG twice a day at home. Cardiology initiated and titrated diltiazem 30 MG qid on 1/18 due to recurrent SVT. Significantly improved with only a very brief episode of NSSVT this morning and cardiology has consolidated to diltiazem CD 120 daily and have cleared her for discharge to SNF. Given advanced age and dementia, unlikely to be a good candidate for catheter ablation but may have to consider if SVT not controlled despite medications. Echo without structural heart disease (08/18/13). TSH normal. K and Mg wnl   UTI (urinary tract infection)  Unfortunately no urine culture sent. Initially started on IV Rocephin >changed to by mouth Ceftin and complete total one-week treatment on 01/13/15   Dementia without behavioral disturbance  Mental status probably at baseline. Resume Namenda. Medication reconciliation was redone today. It was noticed that patient is on chronic benzodiazepines which was inadvertently held in the hospital. Will give her a dose of Ativan prior to discharge and continue prior home dose.   Essential hypertension Reasonably controlled. Continue metoprolol and diltiazem. Diovan discontinued due to initiation of diltiazem and blood pressures reasonably controlled  on metoprolol and diltiazem   Diastolic CHF, chronic Continue Lasix and  bblocker; Compensated   Type 2 diabetes mellitus, controlled Latest a1c 7.2 on no meds acceptable in pt of this age; Arb replaced by diltiazem needed for control SVT;pt still on statin   Hyperlipidemia with target LDL less than 70 Continue Lipitor 10 mg- very acceptable control with LDL -82, HDL -57 and TG - 51 in 06/2014    Pt seen 01/12/2015 Margit Hanks, MD

## 2015-01-16 ENCOUNTER — Encounter: Payer: Self-pay | Admitting: Internal Medicine

## 2015-01-16 NOTE — Assessment & Plan Note (Signed)
Latest a1c 7.2 on no meds acceptable in pt of this age; Arb replaced by diltiazem needed for control SVT;pt still on statin

## 2015-01-16 NOTE — Assessment & Plan Note (Signed)
Reasonably controlled. Continue metoprolol and diltiazem. Diovan discontinued due to initiation of diltiazem and blood pressures reasonably controlled on metoprolol and diltiazem

## 2015-01-16 NOTE — Assessment & Plan Note (Signed)
Continue Lasix and bblocker; Compensated

## 2015-01-16 NOTE — Assessment & Plan Note (Signed)
   Long history of symptomatic palpitations. On Lopressor 100 MG twice a day at home. Cardiology initiated and titrated diltiazem 30 MG qid on 1/18 due to recurrent SVT. Significantly improved with only a very brief episode of NSSVT this morning and cardiology has consolidated to diltiazem CD 120 daily and have cleared her for discharge to SNF. Given advanced age and dementia, unlikely to be a good candidate for catheter ablation but may have to consider if SVT not controlled despite medications. Echo without structural heart disease (08/18/13). TSH normal. K and Mg wnl

## 2015-01-16 NOTE — Assessment & Plan Note (Signed)
Mental status probably at baseline. Resume Namenda. Medication reconciliation was redone today. It was noticed that patient is on chronic benzodiazepines which was inadvertently held in the hospital. Will give her a dose of Ativan prior to discharge and continue prior home dose.

## 2015-01-16 NOTE — Assessment & Plan Note (Signed)
   Unfortunately no urine culture sent. Initially started on IV Rocephin >changed to by mouth Ceftin and complete total one-week treatment on 01/13/15

## 2015-01-16 NOTE — Assessment & Plan Note (Signed)
Continue Lipitor 10 mg- very acceptable control with LDL -82, HDL -57 and TG - 51 in 06/2014

## 2015-02-25 ENCOUNTER — Emergency Department (HOSPITAL_COMMUNITY): Payer: Medicare Other

## 2015-02-25 ENCOUNTER — Encounter (HOSPITAL_COMMUNITY): Payer: Self-pay

## 2015-02-25 ENCOUNTER — Emergency Department (HOSPITAL_COMMUNITY)
Admission: EM | Admit: 2015-02-25 | Discharge: 2015-02-25 | Disposition: A | Discharge status: Home / Self Care | Payer: Medicare Other | Attending: Emergency Medicine | Admitting: Emergency Medicine

## 2015-02-25 DIAGNOSIS — Y92129 Unspecified place in nursing home as the place of occurrence of the external cause: Secondary | ICD-10-CM | POA: Diagnosis not present

## 2015-02-25 DIAGNOSIS — I129 Hypertensive chronic kidney disease with stage 1 through stage 4 chronic kidney disease, or unspecified chronic kidney disease: Secondary | ICD-10-CM | POA: Insufficient documentation

## 2015-02-25 DIAGNOSIS — Z880 Allergy status to penicillin: Secondary | ICD-10-CM | POA: Diagnosis not present

## 2015-02-25 DIAGNOSIS — E119 Type 2 diabetes mellitus without complications: Secondary | ICD-10-CM | POA: Insufficient documentation

## 2015-02-25 DIAGNOSIS — M1990 Unspecified osteoarthritis, unspecified site: Secondary | ICD-10-CM | POA: Insufficient documentation

## 2015-02-25 DIAGNOSIS — Y998 Other external cause status: Secondary | ICD-10-CM | POA: Insufficient documentation

## 2015-02-25 DIAGNOSIS — N182 Chronic kidney disease, stage 2 (mild): Secondary | ICD-10-CM | POA: Diagnosis not present

## 2015-02-25 DIAGNOSIS — Z79899 Other long term (current) drug therapy: Secondary | ICD-10-CM | POA: Insufficient documentation

## 2015-02-25 DIAGNOSIS — J45909 Unspecified asthma, uncomplicated: Secondary | ICD-10-CM | POA: Diagnosis not present

## 2015-02-25 DIAGNOSIS — I5032 Chronic diastolic (congestive) heart failure: Secondary | ICD-10-CM | POA: Insufficient documentation

## 2015-02-25 DIAGNOSIS — Y9389 Activity, other specified: Secondary | ICD-10-CM | POA: Diagnosis not present

## 2015-02-25 DIAGNOSIS — W1839XA Other fall on same level, initial encounter: Secondary | ICD-10-CM | POA: Insufficient documentation

## 2015-02-25 DIAGNOSIS — R011 Cardiac murmur, unspecified: Secondary | ICD-10-CM | POA: Diagnosis not present

## 2015-02-25 DIAGNOSIS — S0990XA Unspecified injury of head, initial encounter: Secondary | ICD-10-CM | POA: Insufficient documentation

## 2015-02-25 DIAGNOSIS — Z791 Long term (current) use of non-steroidal anti-inflammatories (NSAID): Secondary | ICD-10-CM | POA: Insufficient documentation

## 2015-02-25 DIAGNOSIS — Z23 Encounter for immunization: Secondary | ICD-10-CM | POA: Diagnosis not present

## 2015-02-25 DIAGNOSIS — Z720 Tobacco use: Secondary | ICD-10-CM | POA: Diagnosis not present

## 2015-02-25 DIAGNOSIS — W19XXXA Unspecified fall, initial encounter: Secondary | ICD-10-CM

## 2015-02-25 DIAGNOSIS — Z7982 Long term (current) use of aspirin: Secondary | ICD-10-CM | POA: Insufficient documentation

## 2015-02-25 DIAGNOSIS — I2510 Atherosclerotic heart disease of native coronary artery without angina pectoris: Secondary | ICD-10-CM | POA: Diagnosis not present

## 2015-02-25 LAB — I-STAT CHEM 8, ED
BUN: 30 mg/dL — ABNORMAL HIGH (ref 6–23)
CHLORIDE: 102 mmol/L (ref 96–112)
Calcium, Ion: 1.22 mmol/L (ref 1.13–1.30)
Creatinine, Ser: 1 mg/dL (ref 0.50–1.10)
Glucose, Bld: 110 mg/dL — ABNORMAL HIGH (ref 70–99)
HCT: 45 % (ref 36.0–46.0)
Hemoglobin: 15.3 g/dL — ABNORMAL HIGH (ref 12.0–15.0)
POTASSIUM: 4.7 mmol/L (ref 3.5–5.1)
SODIUM: 140 mmol/L (ref 135–145)
TCO2: 24 mmol/L (ref 0–100)

## 2015-02-25 LAB — I-STAT TROPONIN, ED: Troponin i, poc: 0 ng/mL (ref 0.00–0.08)

## 2015-02-25 MED ORDER — TETANUS-DIPHTH-ACELL PERTUSSIS 5-2.5-18.5 LF-MCG/0.5 IM SUSP
0.5000 mL | Freq: Once | INTRAMUSCULAR | Status: AC
  Administered 2015-02-25: 0.5 mL via INTRAMUSCULAR
  Filled 2015-02-25: qty 0.5

## 2015-02-25 NOTE — Discharge Instructions (Signed)
Blunt Trauma °You have been evaluated for injuries. You have been examined and your caregiver has not found injuries serious enough to require hospitalization. °It is common to have multiple bruises and sore muscles following an accident. These tend to feel worse for the first 24 hours. You will feel more stiffness and soreness over the next several hours and worse when you wake up the first morning after your accident. After this point, you should begin to improve with each passing day. The amount of improvement depends on the amount of damage done in the accident. °Following your accident, if some part of your body does not work as it should, or if the pain in any area continues to increase, you should return to the Emergency Department for re-evaluation.  °HOME CARE INSTRUCTIONS  °Routine care for sore areas should include: °· Ice to sore areas every 2 hours for 20 minutes while awake for the next 2 days. °· Drink extra fluids (not alcohol). °· Take a hot or warm shower or bath once or twice a day to increase blood flow to sore muscles. This will help you "limber up". °· Activity as tolerated. Lifting may aggravate neck or back pain. °· Only take over-the-counter or prescription medicines for pain, discomfort, or fever as directed by your caregiver. Do not use aspirin. This may increase bruising or increase bleeding if there are small areas where this is happening. °SEEK IMMEDIATE MEDICAL CARE IF: °· Numbness, tingling, weakness, or problem with the use of your arms or legs. °· A severe headache is not relieved with medications. °· There is a change in bowel or bladder control. °· Increasing pain in any areas of the body. °· Short of breath or dizzy. °· Nauseated, vomiting, or sweating. °· Increasing belly (abdominal) discomfort. °· Blood in urine, stool, or vomiting blood. °· Pain in either shoulder in an area where a shoulder strap would be. °· Feelings of lightheadedness or if you have a fainting  episode. °Sometimes it is not possible to identify all injuries immediately after the trauma. It is important that you continue to monitor your condition after the emergency department visit. If you feel you are not improving, or improving more slowly than should be expected, call your physician. If you feel your symptoms (problems) are worsening, return to the Emergency Department immediately. °Document Released: 09/04/2001 Document Revised: 03/02/2012 Document Reviewed: 07/27/2008 °Exit[employer]® Patient Information ©2015 [employer]. This information is not intended to replace advice given to you by your health care provider. Make sure you discuss any questions you have with your health care provider. ° °

## 2015-02-25 NOTE — ED Notes (Signed)
[facility]  Pt is wheelchair bound.

## 2015-02-25 NOTE — ED Provider Notes (Signed)
CSN: [id number]     Arrival date & time 02/25/15  1715 History   First MD Initiated Contact with Patient 02/25/15 1723     Chief Complaint  Patient presents with  . Fall     (Consider location/radiation/quality/duration/timing/severity/associated sxs/prior Treatment) Patient is a 81 y.o. female presenting with fall. The history is provided by the patient. No language interpreter was used.  Fall This is a new problem. Episode onset: unsure. Pertinent negatives include no chest pain, no abdominal pain, no headaches and no shortness of breath. Associated symptoms comments: R shoulder pain, pain at hematoma, dizziness in ambulance .    Past Medical History  Diagnosis Date  . CVA (cerebral vascular accident)     a. She's not sure that this is true.  [name] CAD (coronary artery disease)     a. reports prior h/o MI's with possible cath @ [facility] in [place] 'many years ago.'  . Vitamin D deficiency   . Diabetes mellitus, type 2   . HTN (hypertension)   . Tremor, essential   . Urinary incontinence   . High risk social situations 11/28/2011  . Intention tremor 03/03/2007    Qualifier: Diagnosis of  By: Dorathy Daft  MD, IDAYLIS    . Syncope, near 02/28/2012  . Altered mental status 01/01/2011    Qualifier: Diagnosis of  By: Earnest Bailey MD, Selena Batten    . DEFICIENCY, VITAMIN D NOS 07/16/2007    Qualifier: Diagnosis of  By: Irving Burton MD, Clifton Custard    . Asthma   . History of pneumonia   . Recurrent upper respiratory infection (URI)   . Hyperthyroidism   . Anemia   . CKD (chronic kidney disease), stage II   . H/O hiatal hernia   . Seizures   . Headache(784.0)   . Neuromuscular disorder   . Arthritis   . Fibromyalgia   . CAROTID ARTERY DISEASE 07/16/2007    a. 07/2012 U/S: no signif extracranial carotid artery stenosis, antegrad vertebral flow.  . Diastolic CHF, chronic     a. 07/2013 Echo: EF 55-60%.  . TOBACCO USER   . Palpitations     a. 8.2014 Echo: EF 55-60%, no rwma, mod LVH, mildly dil LA, PASP  .  [name] Systolic murmur     a. no significant valvular abnormalities on echo 8.2014.  [name] HLD (hyperlipidemia)    Past Surgical History  Procedure Laterality Date  . Appendectomy  1965  . Vaginal hysterectomy  1965  . Knee surgery  1989, 1995   Family History  Problem Relation Age of Onset  . Diabetes Mother   . Diabetes Brother   . Stroke Father    History  Substance Use Topics  . Smoking status: Current Some Day Smoker -- 0.50 packs/day    Types: Cigarettes    Last Attempt to Quit: 04/06/2012  . Smokeless tobacco: Never Used     Comment: does not smoke much...  . Alcohol Use: No   OB History    No data available     Review of Systems  Constitutional: Negative for fever, chills, diaphoresis, activity change, appetite change and fatigue.  HENT: Negative for congestion, facial swelling, rhinorrhea and sore throat.   Eyes: Negative for photophobia and discharge.  Respiratory: Negative for cough, chest tightness and shortness of breath.   Cardiovascular: Negative for chest pain, palpitations and leg swelling.  Gastrointestinal: Negative for nausea, vomiting, abdominal pain and diarrhea.  Endocrine: Negative for polydipsia and polyuria.  Genitourinary: Negative for dysuria, frequency, difficulty urinating  and pelvic pain.  Musculoskeletal: Negative for back pain, arthralgias, neck pain and neck stiffness.  Skin: Negative for color change and wound.  Allergic/Immunologic: Negative for immunocompromised state.  Neurological: Negative for facial asymmetry, weakness, numbness and headaches.  Hematological: Does not bruise/bleed easily.  Psychiatric/Behavioral: Negative for confusion and agitation.      Allergies  Codeine and Penicillins  Home Medications   Prior to Admission medications   Medication Sig Start Date End Date Taking? Authorizing Provider  acetaminophen (TYLENOL) 500 MG tablet Take 500 mg by mouth every 8 (eight) hours.    Historical Provider, MD    aspirin 81 MG tablet Take 81 mg by mouth daily.    Historical Provider, MD  atorvastatin (LIPITOR) 10 MG tablet Take 10 mg by mouth daily.  03/01/14   Historical Provider, MD  calcium carbonate (OS-CAL) 600 MG TABS tablet Take 600 mg by mouth 2 (two) times daily with a meal.    Historical Provider, MD  Capsaicin 0.1 % CREA Apply 1 application topically 3 (three) times daily.    Historical Provider, MD  cefUROXime (CEFTIN) 250 MG tablet Take 1 tablet (250 mg total) by mouth 2 (two) times daily with a meal. Discontinue after 01/13/15 doses. 01/10/15   Elease EtienneAnand D Hongalgi, MD  diltiazem (CARDIZEM CD) 120 MG 24 hr capsule Take 1 capsule (120 mg total) by mouth daily. 01/11/15   Elease EtienneAnand D Hongalgi, MD  ferrous sulfate 325 (65 FE) MG tablet Take 325 mg by mouth daily with breakfast.    Historical Provider, MD  furosemide (LASIX) 20 MG tablet Take 20 mg by mouth every other day.    Historical Provider, MD  LORazepam (ATIVAN) 0.5 MG tablet Take one tablet by mouth four times daily 01/10/15   Elease EtienneAnand D Hongalgi, MD  Melatonin 3 MG CAPS Take 3 mg by mouth at bedtime.    Historical Provider, MD  meloxicam (MOBIC) 7.5 MG tablet Take 7.5 mg by mouth daily.     Historical Provider, MD  Memantine HCl ER (NAMENDA XR) 14 MG CP24 Take 1 tablet by mouth daily.    Historical Provider, MD  metoprolol (LOPRESSOR) 100 MG tablet Take 1 tablet (100 mg total) by mouth 2 (two) times daily. 09/14/13   Jamal CollinJames R Joyner, MD  polyethylene glycol [facility](MIRALAX / Ethelene HalGLYCOLAX) packet Take 17 g by mouth daily.    Historical Provider, MD  PRESCRIPTION MEDICATION Take 120 mLs by mouth 2 (two) times daily. [facility] NSA    Historical Provider, MD  traZODone (DESYREL) 50 MG tablet Take 50 mg by mouth at bedtime as needed.  03/21/14   Historical Provider, MD  vitamin B-12 (CYANOCOBALAMIN) 1000 MCG tablet Take 1,000 mcg by mouth daily.    Historical Provider, MD   BP 154/70 mmHg  Pulse 64  Temp(Src) 97.8 F (36.6 C) (Oral)  Resp 16  SpO2 97% Physical Exam   Constitutional: She is oriented to person, place, and time. She appears well-developed and well-nourished. No distress.  HENT:  Head: Normocephalic and atraumatic.    Mouth/Throat: No oropharyngeal exudate.  Eyes: Pupils are equal, round, and reactive to light.  Neck: Normal range of motion. Neck supple.  Cardiovascular: Normal rate, regular rhythm and normal heart sounds.  Exam reveals no gallop and no friction rub.   No murmur heard. Pulmonary/Chest: Effort normal and breath sounds normal. No respiratory distress. She has no wheezes. She has no rales.  Abdominal: Soft. Bowel sounds are normal. She exhibits no distension and no mass. There  is no tenderness. There is no rebound and no guarding.  Musculoskeletal: Normal range of motion. She exhibits no edema or tenderness.       Arms: Neurological: She is alert and oriented to person, place, and time. She has normal strength. No cranial nerve deficit or sensory deficit. She exhibits normal muscle tone. Coordination normal. GCS eye subscore is 4. GCS verbal subscore is 4. GCS motor subscore is 6.  Skin: Skin is warm and dry.  Psychiatric: She has a normal mood and affect.    ED Course  Procedures (including critical care time) Labs Review Labs Reviewed  I-STAT CHEM 8, ED - Abnormal; Notable for the following:    BUN 30 (*)    Glucose, Bld 110 (*)    Hemoglobin 15.3 (*)    All other components within normal limits  I-STAT TROPOININ, ED    Imaging Review Dg Shoulder Right  02/25/2015   CLINICAL DATA:  Fall and right shoulder pain.  Initial encounter.  EXAM: RIGHT SHOULDER - 2+ VIEW  COMPARISON:  Prior chest radiographs  FINDINGS: Severe degenerative changes are again noted with glenohumeral joint space loss.  No acute fracture, subluxation or dislocation identified.  No focal bony lesions are identified.  The visualized left bony thorax is unremarkable.  IMPRESSION: No evidence of acute bony abnormality.  Severe degenerative changes  within the right shoulder.   Electronically Signed   By: Harmon Pier M.D.   On: 02/25/2015 19:47   Ct Head Wo Contrast  02/25/2015   CLINICAL DATA:  Slipped and fell on kitchen.  Left frontal injury.  EXAM: CT HEAD WITHOUT CONTRAST  CT CERVICAL SPINE WITHOUT CONTRAST  TECHNIQUE: Multidetector CT imaging of the head and cervical spine was performed following the standard protocol without intravenous contrast. Multiplanar CT image reconstructions of the cervical spine were also generated.  COMPARISON:  09/11/2013 head CT.  FINDINGS: CT HEAD FINDINGS  Sinuses/Soft tissues: Left supraorbital and frontal scalp soft tissue swelling which is moderate. Mucosal thickening of ethmoid air cells. Hyperostosis frontalis interna. Clear mastoid air cells.  Intracranial: Advanced small vessel ischemic change. No mass lesion, hemorrhage, hydrocephalus, acute infarct, intra-axial, or extra-axial fluid collection.  CT CERVICAL SPINE FINDINGS  Spinal visualization through the bottom of T2. Prevertebral soft tissues are within normal limits. Maintenance of vertebral body height. Straightening and mild reversal of expected lordosis. Enlargement heterogeneity of the right lobe of the thyroid. Nonspecific but of doubtful clinical significance in this age group. Centrilobular emphysema at the lung apices. Probable scarring at the left lung apex. No apical pneumothorax.  Multilevel spondylosis.  Skull base intact. Only degenerative changes identified within the odontoid process.  IMPRESSION: 1. No acute intracranial abnormality. Left frontal scalp soft tissue swelling. 2. Advanced spondylosis, without acute fracture or subluxation in the cervical spine. 3. Straightening of expected cervical lordosis could be positional, due to muscular spasm, or ligamentous injury. 4. Cerebral atrophy and small vessel ischemic change.   Electronically Signed   By: Jeronimo Greaves M.D.   On: 02/25/2015 20:34   Ct Cervical Spine Wo Contrast  02/25/2015    CLINICAL DATA:  Slipped and fell on kitchen.  Left frontal injury.  EXAM: CT HEAD WITHOUT CONTRAST  CT CERVICAL SPINE WITHOUT CONTRAST  TECHNIQUE: Multidetector CT imaging of the head and cervical spine was performed following the standard protocol without intravenous contrast. Multiplanar CT image reconstructions of the cervical spine were also generated.  COMPARISON:  09/11/2013 head CT.  FINDINGS: CT HEAD FINDINGS  Sinuses/Soft tissues: Left supraorbital and frontal scalp soft tissue swelling which is moderate. Mucosal thickening of ethmoid air cells. Hyperostosis frontalis interna. Clear mastoid air cells.  Intracranial: Advanced small vessel ischemic change. No mass lesion, hemorrhage, hydrocephalus, acute infarct, intra-axial, or extra-axial fluid collection.  CT CERVICAL SPINE FINDINGS  Spinal visualization through the bottom of T2. Prevertebral soft tissues are within normal limits. Maintenance of vertebral body height. Straightening and mild reversal of expected lordosis. Enlargement heterogeneity of the right lobe of the thyroid. Nonspecific but of doubtful clinical significance in this age group. Centrilobular emphysema at the lung apices. Probable scarring at the left lung apex. No apical pneumothorax.  Multilevel spondylosis.  Skull base intact. Only degenerative changes identified within the odontoid process.  IMPRESSION: 1. No acute intracranial abnormality. Left frontal scalp soft tissue swelling. 2. Advanced spondylosis, without acute fracture or subluxation in the cervical spine. 3. Straightening of expected cervical lordosis could be positional, due to muscular spasm, or ligamentous injury. 4. Cerebral atrophy and small vessel ischemic change.   Electronically Signed   By: Jeronimo Greaves M.D.   On: 02/25/2015 20:34     EKG Interpretation   Date/Time:  Saturday February 25 2015 17:26:06 EST Ventricular Rate:  68 PR Interval:  186 QRS Duration: 79 QT Interval:  394 QTC Calculation: 419 R  Axis:   67 Text Interpretation:  Sinus rhythm Atrial premature complexes RSR' in V1  or V2, probably normal variant ST elevation, consider inferior injury  Baseline wander in lead(s) V1 Confirmed by Vasil Juhasz  MD, Kaci Freel [phone]) on  02/25/2015 5:45:29 PM      MDM   Final diagnoses:  Fall at nursing home, initial encounter  Closed head injury, initial encounter    Pt is a 81 y.o. female with Pmhx as above who presents with report of unwittnessed fall at SNF with a frontal hematoma and R shoulder pain. Pt reports pain at hematoma and dizziness while in ambulance, but otherwise no complaints.   Patient has no acute findings of i-STAT chem 8 were troponin.  EKG shows ST elevation, though she is not complaining of chest pain. Do not feel that this is an acute ischemic findings on her EKG. ,  CT head and C-spine are without findings x-ray shoulder is also negative.  The patient is at her baseline and can go back to her facility.  Tetanus is then updated   [place] [facility] evaluation in the Emergency Department is complete. It has been determined that no acute conditions requiring further emergency intervention are present at this time. The patient/guardian have been advised of the diagnosis and plan. We have discussed signs and symptoms that warrant return to the ED, such as changes or worsening in symptoms, altered mental status, chest pain, shortness of breath, inability to tolerate liquids      Toy Cookey, MD 02/26/15 [phone]

## 2015-02-25 NOTE — ED Notes (Signed)
[facility] called for transport back to [facility] 

## 2015-02-25 NOTE — ED Notes (Signed)
[facility] at bedside, report given.  

## 2015-02-25 NOTE — ED Notes (Signed)
Per EMS: [facility], unwitnessed fall. Denies any loss of consciousness, neck or back pain. Complains of left shoulder pain. Obvious hematoma above L brow. A/Ox3.

## 2015-02-25 NOTE — ED Notes (Signed)
Patient transported to CT 

## 2015-03-06 ENCOUNTER — Non-Acute Institutional Stay (SKILLED_NURSING_FACILITY): Payer: Medicare Other | Admitting: Internal Medicine

## 2015-03-06 DIAGNOSIS — D509 Iron deficiency anemia, unspecified: Secondary | ICD-10-CM | POA: Diagnosis not present

## 2015-03-06 DIAGNOSIS — F0150 Vascular dementia without behavioral disturbance: Secondary | ICD-10-CM

## 2015-03-06 DIAGNOSIS — M150 Primary generalized (osteo)arthritis: Secondary | ICD-10-CM | POA: Diagnosis not present

## 2015-03-06 DIAGNOSIS — F419 Anxiety disorder, unspecified: Secondary | ICD-10-CM

## 2015-03-06 DIAGNOSIS — M159 Polyosteoarthritis, unspecified: Secondary | ICD-10-CM

## 2015-03-06 DIAGNOSIS — G4700 Insomnia, unspecified: Secondary | ICD-10-CM

## 2015-03-06 DIAGNOSIS — Z9183 Wandering in diseases classified elsewhere: Secondary | ICD-10-CM

## 2015-03-06 NOTE — Progress Notes (Signed)
MRN: [id number] Name: [name]  Sex: female Age: 81 y.o. DOB: Mar 30, 1920  [facility] #[facility] Facility/Room:127AFULL Level Of Care: SNF Provider: Merrilee Seashore D Emergency Contacts: Extended Emergency Contact Information Primary Emergency Contact: Brown,[name] Address[address] [facility] [address] [place], [place] Home Phone: [phone] Work Phone: [phone] Relation: Other  Code Status:   Allergies: Codeine and Penicillins  Chief Complaint  Patient presents with  . Medical Management of Chronic Issues    HPI: Patient is 81 y.o. female who is being seen for routine issues.  Past Medical History  Diagnosis Date  . CVA (cerebral vascular accident)     a. She's not sure that this is true.  [name] CAD (coronary artery disease)     a. reports prior h/o MI's with possible cath @ [facility] in [place] 'many years ago.'  . Vitamin D deficiency   . Diabetes mellitus, type 2   . HTN (hypertension)   . Tremor, essential   . Urinary incontinence   . High risk social situations 11/28/2011  . Intention tremor 03/03/2007    Qualifier: Diagnosis of  By: Dorathy Daft  MD, IDAYLIS    . Syncope, near 02/28/2012  . Altered mental status 01/01/2011    Qualifier: Diagnosis of  By: Earnest Bailey MD, Selena Batten    . DEFICIENCY, VITAMIN D NOS 07/16/2007    Qualifier: Diagnosis of  By: Irving Burton MD, Clifton Custard    . Asthma   . History of pneumonia   . Recurrent upper respiratory infection (URI)   . Hyperthyroidism   . Anemia   . CKD (chronic kidney disease), stage II   . H/O hiatal hernia   . Seizures   . Headache(784.0)   . Neuromuscular disorder   . Arthritis   . Fibromyalgia   . CAROTID ARTERY DISEASE 07/16/2007    a. 07/2012 U/S: no signif extracranial carotid artery stenosis, antegrad vertebral flow.  . Diastolic CHF, chronic     a. 07/2013 Echo: EF 55-60%.  . TOBACCO USER   . Palpitations     a. 8.2014 Echo: EF 55-60%, no rwma, mod LVH, mildly dil LA, PASP .  [name] Systolic murmur     a. no  significant valvular abnormalities on echo 8.2014.  [name] HLD (hyperlipidemia)   . Anxiety 03/09/2015    Past Surgical History  Procedure Laterality Date  . Appendectomy  1965  . Vaginal hysterectomy  1965  . Knee surgery  1989, 1995      Medication List       This list is accurate as of: 03/06/15 11:59 PM.  Always use your most recent med list.               acetaminophen 500 MG tablet  Commonly known as:  TYLENOL  Take 500 mg by mouth every 8 (eight) hours.     aspirin 81 MG tablet  Take 81 mg by mouth daily.     atorvastatin 10 MG tablet  Commonly known as:  LIPITOR  Take 10 mg by mouth daily.     calcium carbonate 600 MG Tabs tablet  Commonly known as:  OS-CAL  Take 600 mg by mouth 2 (two) times daily with a meal.     Capsaicin 0.1 % Crea  Apply 1 application topically 3 (three) times daily.     cefUROXime 250 MG tablet  Commonly known as:  CEFTIN  Take 1 tablet (250 mg total) by mouth 2 (two) times daily with  a meal. Discontinue after 01/13/15 doses.     diltiazem 120 MG 24 hr capsule  Commonly known as:  CARDIZEM CD  Take 1 capsule (120 mg total) by mouth daily.     ferrous sulfate 325 (65 FE) MG tablet  Take 325 mg by mouth daily with breakfast.     furosemide 20 MG tablet  Commonly known as:  LASIX  Take 20 mg by mouth every other day.     LORazepam 0.5 MG tablet  Commonly known as:  ATIVAN  Take one tablet by mouth four times daily     Melatonin 3 MG Caps  Take 3 mg by mouth at bedtime.     meloxicam 7.5 MG tablet  Commonly known as:  MOBIC  Take 7.5 mg by mouth daily.     metoprolol 100 MG tablet  Commonly known as:  LOPRESSOR  Take 1 tablet (100 mg total) by mouth 2 (two) times daily.     NAMENDA XR 14 MG Cp24 24 hr capsule  Generic drug:  memantine  Take 1 tablet by mouth daily.     polyethylene glycol packet  Commonly known as:  MIRALAX / GLYCOLAX  Take 17 g by mouth daily.     PRESCRIPTION MEDICATION  Take 120 mLs by mouth 2 (two)  times daily. Med Pass NSA     traZODone 50 MG tablet  Commonly known as:  DESYREL  Take 50 mg by mouth at bedtime as needed.     vitamin B-12 1000 MCG tablet  Commonly known as:  CYANOCOBALAMIN  Take 1,000 mcg by mouth daily.        No orders of the defined types were placed in this encounter.    Immunization History  Administered Date(s) Administered  . Pneumococcal Polysaccharide-23 09/22/1994  . Td 05/23/2004  . Tdap 02/25/2015    History  Substance Use Topics  . Smoking status: Current Some Day Smoker -- 0.50 packs/day    Types: Cigarettes    Last Attempt to Quit: 04/06/2012  . Smokeless tobacco: Never Used     Comment: does not smoke much...  . Alcohol Use: No    Review of Systems  DATA OBTAINED: from patient, nurse GENERAL:  no fevers, fatigue, appetite changes SKIN: No itching, rash HEENT: No complaint RESPIRATORY: No cough, wheezing, SOB CARDIAC: No chest pain, palpitations, lower extremity edema  GI: No abdominal pain, No N/V/D or constipation, No heartburn or reflux  GU: No dysuria, frequency or urgency, or incontinence  MUSCULOSKELETAL:R  shoulder pain rekieved with biofreeze NEUROLOGIC: No headache, dizziness  PSYCHIATRIC: No overt anxiety or sadness  Filed Vitals:   03/06/15 2204  BP: 120/64  Pulse: 70  Temp: 97.2 F (36.2 C)  Resp: 19    Physical Exam  GENERAL APPEARANCE: Alert, conversant, No acute distress  SKIN: No diaphoresis rash HEENT: Unremarkable RESPIRATORY: Breathing is even, unlabored. Lung sounds are clear   CARDIOVASCULAR: Heart RRR no murmurs, rubs or gallops. No peripheral edema  GASTROINTESTINAL: Abdomen is soft, non-tender, not distended w/ normal bowel sounds.  GENITOURINARY: Bladder non tender, not distended  MUSCULOSKELETAL: No abnormal joints or musculature NEUROLOGIC: Cranial nerves 2-12 grossly intact. Moves all extremities PSYCHIATRIC: Mood and affect c/w mod dementia, no behavioral issues  Patient Active  Problem List   Diagnosis Date Noted  . Anxiety 03/09/2015  . 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 Altered mental status 01/09/2015  . UTI (urinary tract infection) 01/09/2015  . SVT (supraventricular tachycardia) 01/07/2015  .  DM type 2 causing CKD stage 3 01/07/2015  . PSVT (paroxysmal supraventricular tachycardia) 01/07/2015  . Constipation 12/27/2014  . Anemia, iron deficiency 10/06/2014  . Vascular dementia without behavioral disturbance 07/18/2014  . Palpitations 08/17/2013  . Chest discomfort 08/17/2013  . Trigeminy 08/17/2013  . Toenail deformity 04/30/2013  . Memory loss 09/24/2012  . Insomnia 03/12/2012  . Mobility impaired 01/07/2012  . Diastolic CHF, chronic 12/31/2011  . CAD (coronary artery disease) 12/28/2011  . TOBACCO USER 12/25/2009  . Abnormality of gait 07/20/2009  . Osteoporosis 10/01/2007  . Essential hypertension 08/07/2007  . Type 2 diabetes mellitus, controlled 07/16/2007  . CAROTID ARTERY DISEASE 07/16/2007  . C V A/STROKE 07/16/2007  . URINARY INCONTINENCE, URGE 07/16/2007  . HX, PERSONAL, THROMBOPHLEBITIS 07/16/2007  . Hyperlipidemia with target LDL less than 70 03/03/2007  . ARTHROPATHY NEC, SHOULDER 03/03/2007  . Osteoarthritis 03/03/1979    CBC    Component Value Date/Time   WBC 6.6 01/08/2015 0515   RBC 4.58 01/08/2015 0515   HGB 15.3* 02/25/2015 1807   HCT 45.0 02/25/2015 1807   PLT 281 01/08/2015 0515   MCV 82.1 01/08/2015 0515   LYMPHSABS 0.8 01/07/2015 1010   MONOABS 0.9 01/07/2015 1010   EOSABS 0.0 01/07/2015 1010   BASOSABS 0.1 01/07/2015 1010    CMP     Component Value Date/Time   NA 140 02/25/2015 1807   K 4.7 02/25/2015 1807   CL 102 02/25/2015 1807   CO2 30 01/08/2015 0515   GLUCOSE 110* 02/25/2015 1807   BUN 30* 02/25/2015 1807   CREATININE 1.00 02/25/2015 1807   CREATININE 0.86 04/29/2013 1605   CALCIUM 8.9 01/08/2015 0515   PROT 7.3 12/03/2013 1952   ALBUMIN 3.3* 12/03/2013 1952   AST 18  12/03/2013 1952   ALT 15 12/03/2013 1952   ALKPHOS 95 12/03/2013 1952   BILITOT 0.1* 12/03/2013 1952   GFRNONAA 57* 01/08/2015 0515   GFRAA 66* 01/08/2015 0515    Assessment and Plan  Anemia, iron deficiency 02/2015 Hb 15.5 way up from 10 in 09/2015. MCV is 82, up from 76 with iron therapy;Plan - continue FeSO4  325. Pt also on B12 1000 mcg daily.   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

## 2015-03-09 ENCOUNTER — Encounter: Payer: Self-pay | Admitting: Internal Medicine

## 2015-03-09 DIAGNOSIS — Z9183 Wandering in diseases classified elsewhere: Secondary | ICD-10-CM | POA: Insufficient documentation

## 2015-03-09 DIAGNOSIS — F419 Anxiety disorder, unspecified: Secondary | ICD-10-CM

## 2015-03-09 HISTORY — DX: Anxiety disorder, unspecified: F41.9

## 2015-03-09 NOTE — Assessment & Plan Note (Signed)
Chronic and stable on ativan 0.5 mg QID, no reports of anxiety or anxious behavoir;Plan -continue ativan 0.5 mg QID

## 2015-03-09 NOTE — Assessment & Plan Note (Signed)
Chronic and stable;Plan - continue melatonin 3 mg qHS scheduled and trazodone 50 mg prn;

## 2015-03-09 NOTE — Assessment & Plan Note (Signed)
02/2015 Hb 15.5 way up from 10 in 09/2015. MCV is 82, up from 76 with iron therapy;Plan - continue FeSO4  325. Pt also on B12 1000 mcg daily.

## 2015-03-09 NOTE — Assessment & Plan Note (Signed)
Chronic and multiple joints, mostly shoulders and knees;pt on biofreeze and mobic;plan - continue biofreeze and d/c mobic 2/2 cardiac hx and CKD; replace with TID

## 2015-03-09 NOTE — Assessment & Plan Note (Signed)
MMSE 18/30, BIMS 8/15 indicating moderate disease; pt on namenda 14 mg;Plan - continue namenda and startt aricept 5 mg

## 2015-03-09 NOTE — Assessment & Plan Note (Signed)
Chronic and ongoing with pt requiring wanderguard; on namenf=da for dementia ;Plan-continue wanderguard and namenda;start aricept 5 mg with plans to increase if tolerated

## 2015-03-17 ENCOUNTER — Telehealth: Payer: Self-pay | Admitting: Cardiology

## 2015-03-17 NOTE — Telephone Encounter (Signed)
[facility].Jordan [facility] [place] [facility]

## 2015-03-17 NOTE — Telephone Encounter (Signed)
[name]. She had an episode of tachycardia this am with HR into 120s associated with SOB. She is on metoprolol and diltiazem. After oxygen symptoms improved and HR decreased to 70. BP is good. Will increase diltiazem to 90 mg SR bid. They will call if further problems.  Danissa Rundle [place] MD, [facility]

## 2015-04-11 ENCOUNTER — Other Ambulatory Visit: Payer: Self-pay | Admitting: *Deleted

## 2015-04-11 MED ORDER — LORAZEPAM 0.5 MG PO TABS: ORAL_TABLET | ORAL | Status: DC

## 2015-04-11 NOTE — Telephone Encounter (Signed)
[facility] 

## 2015-07-16 ENCOUNTER — Non-Acute Institutional Stay (SKILLED_NURSING_FACILITY): Payer: Medicare Other | Admitting: Internal Medicine

## 2015-07-16 ENCOUNTER — Encounter: Payer: Self-pay | Admitting: Internal Medicine

## 2015-07-16 DIAGNOSIS — F0150 Vascular dementia without behavioral disturbance: Secondary | ICD-10-CM | POA: Diagnosis not present

## 2015-07-16 DIAGNOSIS — N183 Chronic kidney disease, stage 3 (moderate): Secondary | ICD-10-CM | POA: Diagnosis not present

## 2015-07-16 DIAGNOSIS — E1122 Type 2 diabetes mellitus with diabetic chronic kidney disease: Secondary | ICD-10-CM

## 2015-07-16 DIAGNOSIS — I5032 Chronic diastolic (congestive) heart failure: Secondary | ICD-10-CM

## 2015-07-16 NOTE — Assessment & Plan Note (Signed)
Chronic and stable without exacerbation; Plan - cont lasix 20 mg, metoprolol 100 mg BID, and cardizem CD 90 mg BID

## 2015-07-16 NOTE — Progress Notes (Signed)
MRN: [id number] Name: [name]  Sex: female Age: 81 y.o. DOB: 07/06/1920  [facility] #: [facility] Facility/Room:127 Level Of Care: SNF Provider: Merrilee Seashore D Emergency Contacts: Extended Emergency Contact Information Primary Emergency Contact: Brown,[name] Address[address] [facility] [address] [place], [place] Home Phone: [phone] Work Phone: [phone] Relation: Other  Code Status:   Allergies: Codeine and Penicillins  Chief Complaint  Patient presents with  . Medical Management of Chronic Issues    HPI: Patient is 81 y.o. female who is being seen for dementia CHF and CKD stage 3.  Past Medical History  Diagnosis Date  . CVA (cerebral vascular accident)     a. She's not sure that this is true.  [name] CAD (coronary artery disease)     a. reports prior h/o MI's with possible cath @ [facility] in [place] 'many years ago.'  . Vitamin D deficiency   . Diabetes mellitus, type 2   . HTN (hypertension)   . Tremor, essential   . Urinary incontinence   . High risk social situations 11/28/2011  . Intention tremor 03/03/2007    Qualifier: Diagnosis of  By: Dorathy Daft  MD, IDAYLIS    . Syncope, near 02/28/2012  . Altered mental status 01/01/2011    Qualifier: Diagnosis of  By: Earnest Bailey MD, Selena Batten    . DEFICIENCY, VITAMIN D NOS 07/16/2007    Qualifier: Diagnosis of  By: Irving Burton MD, Clifton Custard    . Asthma   . History of pneumonia   . Recurrent upper respiratory infection (URI)   . Hyperthyroidism   . Anemia   . CKD (chronic kidney disease), stage II   . H/O hiatal hernia   . Seizures   . Headache(784.0)   . Neuromuscular disorder   . Arthritis   . Fibromyalgia   . CAROTID ARTERY DISEASE 07/16/2007    a. 07/2012 U/S: no signif extracranial carotid artery stenosis, antegrad vertebral flow.  . Diastolic CHF, chronic     a. 07/2013 Echo: EF 55-60%.  . TOBACCO USER   . Palpitations     a. 8.2014 Echo: EF 55-60%, no rwma, mod LVH, mildly dil LA, PASP .  [name] Systolic murmur     a.  no significant valvular abnormalities on echo 8.2014.  [name] HLD (hyperlipidemia)   . Anxiety 03/09/2015    Past Surgical History  Procedure Laterality Date  . Appendectomy  1965  . Vaginal hysterectomy  1965  . Knee surgery  1989, 1995      Medication List       This list is accurate as of: 07/16/15  2:23 PM.  Always use your most recent med list.               acetaminophen 500 MG tablet  Commonly known as:  TYLENOL  Take 500 mg by mouth every 8 (eight) hours.     aspirin 81 MG tablet  Take 81 mg by mouth daily.     atorvastatin 10 MG tablet  Commonly known as:  LIPITOR  Take 10 mg by mouth daily.     calcium carbonate 600 MG Tabs tablet  Commonly known as:  OS-CAL  Take 600 mg by mouth 2 (two) times daily with a meal.     Capsaicin 0.1 % Crea  Apply 1 application topically 3 (three) times daily.     cefUROXime 250 MG tablet  Commonly known as:  CEFTIN  Take 1 tablet (250 mg total) by mouth 2 (  two) times daily with a meal. Discontinue after 01/13/15 doses.     diltiazem 120 MG 24 hr capsule  Commonly known as:  CARDIZEM CD  Take 1 capsule (120 mg total) by mouth daily.     ferrous sulfate 325 (65 FE) MG tablet  Take 325 mg by mouth daily with breakfast.     furosemide 20 MG tablet  Commonly known as:  LASIX  Take 20 mg by mouth every other day.     LORazepam 0.5 MG tablet  Commonly known as:  ATIVAN  Take one tablet by mouth four times daily for anxiety     Melatonin 3 MG Caps  Take 3 mg by mouth at bedtime.     meloxicam 7.5 MG tablet  Commonly known as:  MOBIC  Take 7.5 mg by mouth daily.     metoprolol 100 MG tablet  Commonly known as:  LOPRESSOR  Take 1 tablet (100 mg total) by mouth 2 (two) times daily.     NAMENDA XR 14 MG Cp24 24 hr capsule  Generic drug:  memantine  Take 1 tablet by mouth daily.     polyethylene glycol packet  Commonly known as:  MIRALAX / GLYCOLAX  Take 17 g by mouth daily.     PRESCRIPTION MEDICATION  Take 120 mLs by  mouth 2 (two) times daily. Med Pass NSA     traZODone 50 MG tablet  Commonly known as:  DESYREL  Take 50 mg by mouth at bedtime as needed.     vitamin B-12 1000 MCG tablet  Commonly known as:  CYANOCOBALAMIN  Take 1,000 mcg by mouth daily.        No orders of the defined types were placed in this encounter.    Immunization History  Administered Date(s) Administered  . Pneumococcal Polysaccharide-23 09/22/1994  . Td 05/23/2004  . Tdap 02/25/2015    History  Substance Use Topics  . Smoking status: Current Some Day Smoker -- 0.50 packs/day    Types: Cigarettes    Last Attempt to Quit: 04/06/2012  . Smokeless tobacco: Never Used     Comment: does not smoke much...  . Alcohol Use: No    Review of Systems  DATA OBTAINED: from  nurse, medical record GENERAL:  no fevers, fatigue, appetite changes SKIN: No itching, rash HEENT: No complaint RESPIRATORY: No cough, wheezing, SOB CARDIAC: No chest pain, palpitations, lower extremity edema  GI: No abdominal pain, No N/V/D or constipation, No heartburn or reflux  GU: No dysuria, frequency or urgency, or incontinence  MUSCULOSKELETAL: No unrelieved bone/joint pain NEUROLOGIC: No headache, dizziness  PSYCHIATRIC: No overt anxiety or sadness  Filed Vitals:   07/16/15 1413  BP: 107/61  Pulse: 56  Temp: 97.1 F (36.2 C)  Resp: 16    Physical Exam  GENERAL APPEARANCE: Alert, min conversant, No acute distress  SKIN: No diaphoresis rash HEENT: Unremarkable RESPIRATORY: Breathing is even, unlabored. Lung sounds are clear   CARDIOVASCULAR: Heart RRR no murmurs, rubs or gallops. No peripheral edema  GASTROINTESTINAL: Abdomen is soft, non-tender, not distended w/ normal bowel sounds.  GENITOURINARY: Bladder non tender, not distended  MUSCULOSKELETAL: No abnormal joints or musculature NEUROLOGIC: Cranial nerves 2-12 grossly intact PSYCHIATRIC: dementia, no behavioral issues  Patient Active Problem List   Diagnosis Date Noted   . Anxiety 03/09/2015  . 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 Altered mental status 01/09/2015  . UTI (urinary tract infection) 01/09/2015  . SVT (supraventricular tachycardia) 01/07/2015  . DM  type 2 causing CKD stage 3 01/07/2015  . PSVT (paroxysmal supraventricular tachycardia) 01/07/2015  . Constipation 12/27/2014  . Anemia, iron deficiency 10/06/2014  . Vascular dementia without behavioral disturbance 07/18/2014  . Palpitations 08/17/2013  . Chest discomfort 08/17/2013  . Trigeminy 08/17/2013  . Toenail deformity 04/30/2013  . Memory loss 09/24/2012  . Insomnia 03/12/2012  . Mobility impaired 01/07/2012  . Diastolic CHF, chronic 12/31/2011  . CAD (coronary artery disease) 12/28/2011  . TOBACCO USER 12/25/2009  . Abnormality of gait 07/20/2009  . Osteoporosis 10/01/2007  . Essential hypertension 08/07/2007  . Type 2 diabetes mellitus, controlled 07/16/2007  . CAROTID ARTERY DISEASE 07/16/2007  . C V A/STROKE 07/16/2007  . URINARY INCONTINENCE, URGE 07/16/2007  . HX, PERSONAL, THROMBOPHLEBITIS 07/16/2007  . Hyperlipidemia with target LDL less than 70 03/03/2007  . ARTHROPATHY NEC, SHOULDER 03/03/2007  . Osteoarthritis 03/03/1979    CBC    Component Value Date/Time   WBC 6.6 01/08/2015 0515   RBC 4.58 01/08/2015 0515   HGB 15.3* 02/25/2015 1807   HCT 45.0 02/25/2015 1807   PLT 281 01/08/2015 0515   MCV 82.1 01/08/2015 0515   LYMPHSABS 0.8 01/07/2015 1010   MONOABS 0.9 01/07/2015 1010   EOSABS 0.0 01/07/2015 1010   BASOSABS 0.1 01/07/2015 1010    CMP     Component Value Date/Time   NA 140 02/25/2015 1807   K 4.7 02/25/2015 1807   CL 102 02/25/2015 1807   CO2 30 01/08/2015 0515   GLUCOSE 110* 02/25/2015 1807   BUN 30* 02/25/2015 1807   CREATININE 1.00 02/25/2015 1807   CREATININE 0.86 04/29/2013 1605   CALCIUM 8.9 01/08/2015 0515   PROT 7.3 12/03/2013 1952   ALBUMIN 3.3* 12/03/2013 1952   AST 18 12/03/2013 1952   ALT 15 12/03/2013  1952   ALKPHOS 95 12/03/2013 1952   BILITOT 0.1* 12/03/2013 1952   GFRNONAA 57* 01/08/2015 0515   GFRAA 66* 01/08/2015 0515    Assessment and Plan  Vascular dementia without behavioral disturbance Chronic and progressive without major declines;Plan - cont namenda and aricept  DM type 2 causing CKD stage 3 03/2015 BUN 25/Cr 0.80 CrCl -38; plan - continue to monitor at regular intervals, encourage po intake  Diastolic CHF, chronic Chronic and stable without exacerbation; Plan - cont lasix 20 mg, metoprolol 100 mg BID, and cardizem CD 90 mg BID    Margit Hanks, MD

## 2015-07-16 NOTE — Assessment & Plan Note (Signed)
Chronic and progressive without major declines;Plan - cont namenda and aricept

## 2015-07-16 NOTE — Assessment & Plan Note (Signed)
03/2015 BUN 25/Cr 0.80 CrCl -38; plan - continue to monitor at regular intervals, encourage po intake

## 2015-07-31 LAB — HM DIABETES EYE EXAM

## 2015-09-25 ENCOUNTER — Emergency Department (HOSPITAL_COMMUNITY): Payer: Medicare Other

## 2015-09-25 ENCOUNTER — Emergency Department (HOSPITAL_COMMUNITY)
Admission: EM | Admit: 2015-09-25 | Discharge: 2015-09-25 | Disposition: A | Discharge status: Home / Self Care | Payer: Medicare Other | Attending: Emergency Medicine | Admitting: Emergency Medicine

## 2015-09-25 ENCOUNTER — Encounter (HOSPITAL_COMMUNITY): Payer: Self-pay | Admitting: Emergency Medicine

## 2015-09-25 DIAGNOSIS — E785 Hyperlipidemia, unspecified: Secondary | ICD-10-CM | POA: Diagnosis not present

## 2015-09-25 DIAGNOSIS — Z8701 Personal history of pneumonia (recurrent): Secondary | ICD-10-CM | POA: Diagnosis not present

## 2015-09-25 DIAGNOSIS — Z880 Allergy status to penicillin: Secondary | ICD-10-CM | POA: Diagnosis not present

## 2015-09-25 DIAGNOSIS — J45909 Unspecified asthma, uncomplicated: Secondary | ICD-10-CM | POA: Insufficient documentation

## 2015-09-25 DIAGNOSIS — Z8669 Personal history of other diseases of the nervous system and sense organs: Secondary | ICD-10-CM | POA: Diagnosis not present

## 2015-09-25 DIAGNOSIS — Z8673 Personal history of transient ischemic attack (TIA), and cerebral infarction without residual deficits: Secondary | ICD-10-CM | POA: Insufficient documentation

## 2015-09-25 DIAGNOSIS — Z79899 Other long term (current) drug therapy: Secondary | ICD-10-CM | POA: Diagnosis not present

## 2015-09-25 DIAGNOSIS — N182 Chronic kidney disease, stage 2 (mild): Secondary | ICD-10-CM | POA: Diagnosis not present

## 2015-09-25 DIAGNOSIS — M1990 Unspecified osteoarthritis, unspecified site: Secondary | ICD-10-CM | POA: Diagnosis not present

## 2015-09-25 DIAGNOSIS — Z8709 Personal history of other diseases of the respiratory system: Secondary | ICD-10-CM | POA: Diagnosis not present

## 2015-09-25 DIAGNOSIS — Z7982 Long term (current) use of aspirin: Secondary | ICD-10-CM | POA: Insufficient documentation

## 2015-09-25 DIAGNOSIS — D649 Anemia, unspecified: Secondary | ICD-10-CM | POA: Diagnosis not present

## 2015-09-25 DIAGNOSIS — R011 Cardiac murmur, unspecified: Secondary | ICD-10-CM | POA: Diagnosis not present

## 2015-09-25 DIAGNOSIS — I129 Hypertensive chronic kidney disease with stage 1 through stage 4 chronic kidney disease, or unspecified chronic kidney disease: Secondary | ICD-10-CM | POA: Diagnosis not present

## 2015-09-25 DIAGNOSIS — I2510 Atherosclerotic heart disease of native coronary artery without angina pectoris: Secondary | ICD-10-CM | POA: Diagnosis not present

## 2015-09-25 DIAGNOSIS — F0390 Unspecified dementia without behavioral disturbance: Secondary | ICD-10-CM | POA: Diagnosis not present

## 2015-09-25 DIAGNOSIS — I5032 Chronic diastolic (congestive) heart failure: Secondary | ICD-10-CM | POA: Insufficient documentation

## 2015-09-25 DIAGNOSIS — E119 Type 2 diabetes mellitus without complications: Secondary | ICD-10-CM | POA: Diagnosis not present

## 2015-09-25 DIAGNOSIS — E559 Vitamin D deficiency, unspecified: Secondary | ICD-10-CM | POA: Insufficient documentation

## 2015-09-25 DIAGNOSIS — F419 Anxiety disorder, unspecified: Secondary | ICD-10-CM | POA: Diagnosis present

## 2015-09-25 DIAGNOSIS — Z720 Tobacco use: Secondary | ICD-10-CM | POA: Diagnosis not present

## 2015-09-25 LAB — CBC WITH DIFFERENTIAL/PLATELET
Basophils Absolute: 0.1 10*3/uL (ref 0.0–0.1)
Basophils Relative: 1 %
EOS PCT: 2 %
Eosinophils Absolute: 0.1 10*3/uL (ref 0.0–0.7)
HCT: 39.8 % (ref 36.0–46.0)
Hemoglobin: 13.2 g/dL (ref 12.0–15.0)
Lymphocytes Relative: 10 %
Lymphs Abs: 0.8 10*3/uL (ref 0.7–4.0)
MCH: 28.4 pg (ref 26.0–34.0)
MCHC: 33.2 g/dL (ref 30.0–36.0)
MCV: 85.6 fL (ref 78.0–100.0)
MONOS PCT: 11 %
Monocytes Absolute: 0.9 10*3/uL (ref 0.1–1.0)
Neutro Abs: 5.9 10*3/uL (ref 1.7–7.7)
Neutrophils Relative %: 76 %
Platelets: 286 10*3/uL (ref 150–400)
RBC: 4.65 MIL/uL (ref 3.87–5.11)
RDW: 15 % (ref 11.5–15.5)
WBC: 7.7 10*3/uL (ref 4.0–10.5)

## 2015-09-25 LAB — URINALYSIS, ROUTINE W REFLEX MICROSCOPIC
Bilirubin Urine: NEGATIVE
GLUCOSE, UA: NEGATIVE mg/dL
Ketones, ur: NEGATIVE mg/dL
Leukocytes, UA: NEGATIVE
Nitrite: NEGATIVE
PROTEIN: NEGATIVE mg/dL
Specific Gravity, Urine: 1.023 (ref 1.005–1.030)
Urobilinogen, UA: 0.2 mg/dL (ref 0.0–1.0)
pH: 5.5 (ref 5.0–8.0)

## 2015-09-25 LAB — I-STAT CHEM 8, ED
BUN: 29 mg/dL — AB (ref 6–20)
CALCIUM ION: 1.21 mmol/L (ref 1.13–1.30)
Chloride: 104 mmol/L (ref 101–111)
Creatinine, Ser: 0.9 mg/dL (ref 0.44–1.00)
Glucose, Bld: 87 mg/dL (ref 65–99)
HCT: 45 % (ref 36.0–46.0)
Hemoglobin: 15.3 g/dL — ABNORMAL HIGH (ref 12.0–15.0)
Potassium: 4.2 mmol/L (ref 3.5–5.1)
Sodium: 143 mmol/L (ref 135–145)
TCO2: 26 mmol/L (ref 0–100)

## 2015-09-25 LAB — URINE MICROSCOPIC-ADD ON

## 2015-09-25 LAB — TROPONIN I

## 2015-09-25 NOTE — Discharge Instructions (Signed)

## 2015-09-25 NOTE — ED Notes (Signed)
Pt stable, ambulatory, states understanding of discharge instructions 

## 2015-09-25 NOTE — ED Notes (Signed)
Patient arrived via [facility]. EMS reports that patient is from [place]. Wheelchair bound. Dementia. Patient c/o "felt like heart is racing." Pulse 80. Patient anxious. Speaking in full sentences. No shortness of breath. Denies pain or distress. VSS. BP 124/82, Pulse 80, CBG 193.

## 2015-09-25 NOTE — ED Notes (Signed)
PTAR 

## 2015-09-25 NOTE — ED Provider Notes (Signed)
CSN: [id number]     Arrival date & time 09/25/15  1922 History   First MD Initiated Contact with Patient 09/25/15 1951     Chief Complaint  Patient presents with  . Anxiety    Level V caveat due to dementia. (Consider location/radiation/quality/duration/timing/severity/associated sxs/prior Treatment) Patient is a 81 y.o. female presenting with anxiety. The history is provided by the patient.  Anxiety   unsure of why patient was sent in. Reportedly sent in for anxiety or tachycardia. A she has dementia and cannot tell me. Not sure of initial planes. Patient tells me she wants to get and time with her mother.  Past Medical History  Diagnosis Date  . CVA (cerebral vascular accident) (HCC)     a. She's not sure that this is true.  [name] CAD (coronary artery disease)     a. reports prior h/o MI's with possible cath @ [facility] in [place] 'many years ago.'  . Vitamin D deficiency   . Diabetes mellitus, type 2 (HCC)   . HTN (hypertension)   . Tremor, essential   . Urinary incontinence   . High risk social situations 11/28/2011  . Intention tremor 03/03/2007    Qualifier: Diagnosis of  By: Dorathy Daft  MD, IDAYLIS    . Syncope, near 02/28/2012  . Altered mental status 01/01/2011    Qualifier: Diagnosis of  By: Earnest Bailey MD, Selena Batten    . DEFICIENCY, VITAMIN D NOS 07/16/2007    Qualifier: Diagnosis of  By: Irving Burton MD, Clifton Custard    . Asthma   . History of pneumonia   . Recurrent upper respiratory infection (URI)   . Hyperthyroidism   . Anemia   . CKD (chronic kidney disease), stage II   . H/O hiatal hernia   . Seizures (HCC)   . Headache(784.0)   . Neuromuscular disorder (HCC)   . Arthritis   . Fibromyalgia   . CAROTID ARTERY DISEASE 07/16/2007    a. 07/2012 U/S: no signif extracranial carotid artery stenosis, antegrad vertebral flow.  . Diastolic CHF, chronic (HCC)     a. 07/2013 Echo: EF 55-60%.  . TOBACCO USER   . Palpitations     a. 8.2014 Echo: EF 55-60%, no rwma, mod LVH, mildly dil LA, PASP  .  [name] Systolic murmur     a. no significant valvular abnormalities on echo 8.2014.  [name] HLD (hyperlipidemia)   . Anxiety 03/09/2015   Past Surgical History  Procedure Laterality Date  . Appendectomy  1965  . Vaginal hysterectomy  1965  . Knee surgery  1989, 1995   Family History  Problem Relation Age of Onset  . Diabetes Mother   . Diabetes Brother   . Stroke Father    Social History  Substance Use Topics  . Smoking status: Current Some Day Smoker -- 0.50 packs/day    Types: Cigarettes    Last Attempt to Quit: 04/06/2012  . Smokeless tobacco: Never Used     Comment: does not smoke much...  . Alcohol Use: No   OB History    No data available     Review of Systems  Unable to perform ROS     Allergies  Codeine and Penicillins  Home Medications   Prior to Admission medications   Medication Sig Start Date End Date Taking? Authorizing Provider  acetaminophen (TYLENOL) 325 MG tablet Take 650 mg by mouth 3 (three) times daily.   Yes Historical Provider, MD  aspirin 81 MG tablet Take 81 mg by mouth daily.  Yes Historical Provider, MD  atorvastatin (LIPITOR) 10 MG tablet Take 10 mg by mouth daily.  03/01/14  Yes Historical Provider, MD  calcium carbonate (OS-CAL) 600 MG TABS tablet Take 600 mg by mouth daily with breakfast.    Yes Historical Provider, MD  diltiazem (CARDIZEM) 90 MG tablet Take 90 mg by mouth 2 (two) times daily.   Yes Historical Provider, MD  ferrous sulfate 325 (65 FE) MG tablet Take 325 mg by mouth daily with breakfast.   Yes Historical Provider, MD  furosemide (LASIX) 20 MG tablet Take 20 mg by mouth every other day.   Yes Historical Provider, MD  LORazepam (ATIVAN) 0.5 MG tablet Take one tablet by mouth four times daily for anxiety 04/11/15  Yes Sharon Seller, NP  Melatonin 3 MG CAPS Take 3 mg by mouth at bedtime.   Yes Historical Provider, MD  memantine (NAMENDA XR) 28 MG CP24 24 hr capsule Take 28 mg by mouth daily.   Yes Historical Provider, MD   metoprolol (LOPRESSOR) 100 MG tablet Take 1 tablet (100 mg total) by mouth 2 (two) times daily. 09/14/13  Yes Jamal Collin, MD  nitroGLYCERIN (NITROSTAT) 0.4 MG SL tablet Place 0.4 mg under the tongue every 5 (five) minutes as needed for chest pain.   Yes Historical Provider, MD  polyethylene glycol (MIRALAX / GLYCOLAX) packet Take 17 g by mouth daily.   Yes Historical Provider, MD  Propylene Glycol (SYSTANE BALANCE) 0.6 % SOLN Place 1 drop into both eyes 2 (two) times daily.   Yes Historical Provider, MD  pseudoephedrine-dextromethorphan-guaifenesin (ROBITUSSIN-PE) 30-10-100 MG/5ML solution Take 10 mLs by mouth 2 (two) times daily as needed for cough.   Yes Historical Provider, MD  sertraline (ZOLOFT) 25 MG tablet Take 37.5 mg by mouth daily.   Yes Historical Provider, MD  traZODone (DESYREL) 50 MG tablet Take 50 mg by mouth at bedtime as needed for sleep.  03/21/14  Yes Historical Provider, MD  vitamin B-12 (CYANOCOBALAMIN) 1000 MCG tablet Take 1,000 mcg by mouth every other day.    Yes Historical Provider, MD  vitamin C (ASCORBIC ACID) 500 MG tablet Take 500 mg by mouth daily.   Yes Historical Provider, MD  Vitamin D, Ergocalciferol, (DRISDOL) 50000 UNITS CAPS capsule Take 50,000 Units by mouth every 30 (thirty) days.   Yes Historical Provider, MD   BP 129/71 mmHg  Pulse 68  Temp(Src) 97.9 F (36.6 C) (Oral)  Resp 17  Ht  (1.626 m)  Wt 122 lb (55.339 kg)  BMI 20.93 kg/m2  SpO2 96% Physical Exam  Constitutional: She appears well-developed.  HENT:  Head: Atraumatic.  Cardiovascular: Normal rate and regular rhythm.   Pulmonary/Chest: Effort normal.  Abdominal: Soft. There is no tenderness.  Musculoskeletal: She exhibits no tenderness.  Neurological: She is alert.  Patient has a baseline dementia.  Skin: Skin is warm.    ED Course  Procedures (including critical care time) Labs Review Labs Reviewed  URINALYSIS, ROUTINE W REFLEX MICROSCOPIC (NOT AT [facility]) - Abnormal; Notable  for the following:    Hgb urine dipstick SMALL (*)    All other components within normal limits  I-STAT CHEM 8, ED - Abnormal; Notable for the following:    BUN 29 (*)    Hemoglobin 15.3 (*)    All other components within normal limits  CBC WITH DIFFERENTIAL/PLATELET  TROPONIN I  URINE MICROSCOPIC-ADD ON    Imaging Review Dg Chest Portable 1 View  09/25/2015   CLINICAL DATA:  Palpitations.  Altered mental status.  EXAM: PORTABLE CHEST 1 VIEW  COMPARISON:  01/07/2015  FINDINGS: A single AP portable view of the chest demonstrates no focal airspace consolidation or alveolar edema. The lungs are grossly clear, with mild chronic appearing interstitial coarsening. There is no large effusion or pneumothorax. Cardiac and mediastinal contours appear unremarkable.  IMPRESSION: No active disease.   Electronically Signed   By: Ellery Plunk M.D.   On: 09/25/2015 21:04   I have personally reviewed and evaluated these images and lab results as part of my medical decision-making.   EKG Interpretation None      MDM   Final diagnoses:  Dementia, without behavioral disturbance    Patient sent in for possible anxiety. Lab work overall reassuring. Patient is at her dependent baseline. Will discharge home.    Benjiman Core, MD 09/27/15 [phone]

## 2015-09-25 NOTE — ED Notes (Addendum)
Patient disoriented to time and place. Oriented to self and place. 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 Also, that she had worked for [employer]. Patient very pleasant, but seems quite confused. When asked about reporting that she felt her heart was racing, patient stated "oh yeah, that's been going on for a long time." Denies pain. No distress noted. Patient has dementia.

## 2015-09-26 LAB — CBC AND DIFFERENTIAL
HCT: 39 % (ref 36–46)
Hemoglobin: 12.7 g/dL (ref 12.0–16.0)
Platelets: 263 10*3/uL (ref 150–399)
WBC: 7.6 10^3/mL

## 2015-09-26 LAB — HEPATIC FUNCTION PANEL
ALT: 17 U/L (ref 7–35)
AST: 15 U/L (ref 13–35)
Bilirubin, Total: 0.3 mg/dL

## 2015-09-26 LAB — BASIC METABOLIC PANEL
BUN: 20 mg/dL (ref 4–21)
CREATININE: 0.8 mg/dL (ref <=1.1)
Glucose: 106 mg/dL
Sodium: 141 mmol/L (ref 137–147)

## 2015-09-26 LAB — LIPID PANEL
Cholesterol: 128 mg/dL (ref 0–200)
HDL: 46 mg/dL (ref 35–70)
LDL Cholesterol: 73 mg/dL
TRIGLYCERIDES: 43 mg/dL (ref 40–160)

## 2015-09-26 LAB — HEMOGLOBIN A1C: Hemoglobin A1C: 6.6

## 2015-10-30 ENCOUNTER — Other Ambulatory Visit: Payer: Self-pay | Admitting: *Deleted

## 2015-10-30 MED ORDER — LORAZEPAM 0.5 MG PO TABS: ORAL_TABLET | ORAL | Status: DC

## 2015-10-30 NOTE — Telephone Encounter (Signed)
[facility] 

## 2015-11-13 ENCOUNTER — Non-Acute Institutional Stay (SKILLED_NURSING_FACILITY): Payer: Medicare Other | Admitting: Internal Medicine

## 2015-11-13 ENCOUNTER — Encounter: Payer: Self-pay | Admitting: Internal Medicine

## 2015-11-13 DIAGNOSIS — E785 Hyperlipidemia, unspecified: Secondary | ICD-10-CM | POA: Diagnosis not present

## 2015-11-13 DIAGNOSIS — F0150 Vascular dementia without behavioral disturbance: Secondary | ICD-10-CM

## 2015-11-13 DIAGNOSIS — M159 Polyosteoarthritis, unspecified: Secondary | ICD-10-CM

## 2015-11-13 DIAGNOSIS — E1122 Type 2 diabetes mellitus with diabetic chronic kidney disease: Secondary | ICD-10-CM

## 2015-11-13 DIAGNOSIS — N183 Chronic kidney disease, stage 3 unspecified: Secondary | ICD-10-CM

## 2015-11-13 DIAGNOSIS — M150 Primary generalized (osteo)arthritis: Secondary | ICD-10-CM | POA: Diagnosis not present

## 2015-11-13 DIAGNOSIS — I25118 Atherosclerotic heart disease of native coronary artery with other forms of angina pectoris: Secondary | ICD-10-CM | POA: Diagnosis not present

## 2015-11-13 DIAGNOSIS — I5032 Chronic diastolic (congestive) heart failure: Secondary | ICD-10-CM

## 2015-11-13 NOTE — Progress Notes (Signed)
Patient ID: [name], female   DOB: [birth year], 81 y.o.   MRN: [id number]    DATE: 11/13/15  Location:  [facility]    Place of Service: SNF (31)   Extended Emergency Contact Information Primary Emergency Contact: Brown,[name] [address] [place] [place], [place] Home Phone: [phone] Work Phone: [phone] Relation: Other  Advanced Directive information  DNR/DNH  Chief Complaint  Patient presents with  . Medical Management of Chronic Issues; [facility]    HPI:  81 yo female long term resident seen today for f/u. She has no c/o. She is a poor historian due to dementia. Hx obtained from chart  Hx CVA - stable on ASA daily  Dementia/anxiety - stable on namenda xr, melatonin, trazodone and lorazepam. Albumin 3.4  CAD/HTN/HF - stable on lasix, lopressor,prn NTG, cardizem  Hyperlipidemia - stable on lipitor. LDL 73  DM/CKD - diet controlled. Cr 0.8.    GERD/constipation - stable on miralax  She takes vitamins and minerals  Past Medical History  Diagnosis Date  . CVA (cerebral vascular accident) (HCC)     a. She's not sure that this is true.  [name] CAD (coronary artery disease)     a. reports prior h/o MI's with possible cath @ [facility] in [place] 'many years ago.'  . Vitamin D deficiency   . Diabetes mellitus, type 2 (HCC)   . HTN (hypertension)   . Tremor, essential   . Urinary incontinence   . High risk social situations 11/28/2011  . Intention tremor 03/03/2007    Qualifier: Diagnosis of  By: Dorathy Daft  MD, IDAYLIS    . Syncope, near 02/28/2012  . Altered mental status 01/01/2011    Qualifier: Diagnosis of  By: Earnest Bailey MD, Selena Batten    . DEFICIENCY, VITAMIN D NOS 07/16/2007    Qualifier: Diagnosis of  By: Irving Burton MD, Clifton Custard    . Asthma   . History of pneumonia   . Recurrent upper respiratory infection (URI)   . Hyperthyroidism   . Anemia   . CKD (chronic kidney disease), stage II   . H/O hiatal hernia   . Seizures (HCC)   .  Headache(784.0)   . Neuromuscular disorder (HCC)   . Arthritis   . Fibromyalgia   . CAROTID ARTERY DISEASE 07/16/2007    a. 07/2012 U/S: no signif extracranial carotid artery stenosis, antegrad vertebral flow.  . Diastolic CHF, chronic (HCC)     a. 07/2013 Echo: EF 55-60%.  . TOBACCO USER   . Palpitations     a. 8.2014 Echo: EF 55-60%, no rwma, mod LVH, mildly dil LA, PASP .  [name] Systolic murmur     a. no significant valvular abnormalities on echo 8.2014.  [name] HLD (hyperlipidemia)   . Anxiety 03/09/2015    Past Surgical History  Procedure Laterality Date  . Appendectomy  1965  . Vaginal hysterectomy  1965  . Knee surgery  1989, 1995    Patient Care Team: Yolande Jolly, MD as PCP - General (Family Medicine)  Social History   Social History  . Marital Status: Widowed    Spouse Name: N/A  . Number of Children: N/A  . Years of Education: N/A   Occupational History  . Retired     from [place] [employer]   Social History Main Topics  . Smoking status: Current Some Day Smoker -- 0.50 packs/day    Types: Cigarettes    Last Attempt to Quit:  04/06/2012  . Smokeless tobacco: Never Used     Comment: does not smoke much...  . Alcohol Use: No  . Drug Use: No  . Sexual Activity: No   Other Topics Concern  . Not on file   Social History Narrative   Lives at home alone. Widowed.  Husband was in the [employer].  Able to cook, bathe and cleans by herself. Walks with cane and crutch. Has a wheelchair but not able to use it.   Does not have anyone she can really trust. Has a friend that works at the [employer] that stops by to help some.   Lives off social security, retired from [employer] in 1988.   After mortgage, has $400 left.      Does not have a POA if anything should happen to her. Has 2 nieces, 2 nephews that live far away. (Only one nephew "knows that I am alive")      She does want CPR attempted but does not wish to have too much done. Organ  donor/donating body to science.       Diet consists mostly of greens and veggies. Little meat, must force herself to eat meat.      Smokes 2-4 cigs per day.      reports that she has been smoking Cigarettes.  She has been smoking about 0.50 packs per day. She has never used smokeless tobacco. She reports that she does not drink alcohol or use illicit drugs.  Immunization History  Administered Date(s) Administered  . Pneumococcal Polysaccharide-23 09/22/1994  . Td 05/23/2004  . Tdap 02/25/2015    Allergies  Allergen Reactions  . Codeine Other (See Comments)    Pt states that it knocks her out.   [name] Penicillins Rash    Hives, upset stomach     Medications: Patient's Medications  New Prescriptions   No medications on file  Previous Medications   ACETAMINOPHEN (TYLENOL) 325 MG TABLET    Take 650 mg by mouth 3 (three) times daily.   ASPIRIN 81 MG TABLET    Take 81 mg by mouth daily.   ATORVASTATIN (LIPITOR) 10 MG TABLET    Take 10 mg by mouth daily.    CALCIUM CARBONATE (OS-CAL) 600 MG TABS TABLET    Take 600 mg by mouth daily with breakfast.    DILTIAZEM (CARDIZEM) 90 MG TABLET    Take 90 mg by mouth 2 (two) times daily.   FERROUS SULFATE 325 (65 FE) MG TABLET    Take 325 mg by mouth daily with breakfast.   FUROSEMIDE (LASIX) 20 MG TABLET    Take 20 mg by mouth every other day.   LORAZEPAM (ATIVAN) 0.5 MG TABLET    Take one tablet by mouth twice daily for anxiety   MELATONIN 3 MG CAPS    Take 3 mg by mouth at bedtime.   MEMANTINE (NAMENDA XR) 28 MG CP24 24 HR CAPSULE    Take 28 mg by mouth daily.   METOPROLOL (LOPRESSOR) 100 MG TABLET    Take 1 tablet (100 mg total) by mouth 2 (two) times daily.   NITROGLYCERIN (NITROSTAT) 0.4 MG SL TABLET    Place 0.4 mg under the tongue every 5 (five) minutes as needed for chest pain.   POLYETHYLENE GLYCOL (MIRALAX / GLYCOLAX) PACKET    Take 17 g by mouth daily.   PROPYLENE GLYCOL (SYSTANE BALANCE) 0.6 % SOLN    Place 1 drop into both eyes 2  (two) times  daily.   PSEUDOEPHEDRINE-DEXTROMETHORPHAN-GUAIFENESIN (ROBITUSSIN-PE) 30-10-100 MG/5ML SOLUTION    Take 10 mLs by mouth 2 (two) times daily as needed for cough.   SERTRALINE (ZOLOFT) 25 MG TABLET    Take 37.5 mg by mouth daily.   TRAZODONE (DESYREL) 50 MG TABLET    Take 50 mg by mouth at bedtime as needed for sleep.    VITAMIN B-12 (CYANOCOBALAMIN) 1000 MCG TABLET    Take 1,000 mcg by mouth every other day.    VITAMIN C (ASCORBIC ACID) 500 MG TABLET    Take 500 mg by mouth daily.   VITAMIN D, ERGOCALCIFEROL, (DRISDOL) 50000 UNITS CAPS CAPSULE    Take 50,000 Units by mouth every 30 (thirty) days.  Modified Medications   No medications on file  Discontinued Medications   No medications on file    Review of Systems  Unable to perform ROS: Dementia    Filed Vitals:   11/13/15 1654  BP: 136/76  Pulse: 68  Temp: 97.8 F (36.6 C)  Weight: 121 lb 12.8 oz (55.248 kg)   Body mass index is 20.9 kg/(m^2).  Physical Exam  Constitutional: She appears well-developed.  Lying in bed in NAD, frail appearing  HENT:  Mouth/Throat: Oropharynx is clear and moist. No oropharyngeal exudate.  Eyes: Pupils are equal, round, and reactive to light. No scleral icterus.  Neck: Neck supple. Carotid bruit is not present. No tracheal deviation present. No thyromegaly present.  Cardiovascular: Regular rhythm and intact distal pulses.  Bradycardia present.  Exam reveals no gallop and no friction rub.   Murmur heard.  Systolic murmur is present with a grade of 1/6  No LE edema b/l. no calf TTP.   Pulmonary/Chest: Effort normal and breath sounds normal. No stridor. No respiratory distress. She has no wheezes. She has no rales.  Abdominal: Soft. Bowel sounds are normal. She exhibits no distension and no mass. There is no hepatomegaly. There is no tenderness. There is no rebound and no guarding.  Musculoskeletal: She exhibits edema.  Lymphadenopathy:    She has no cervical adenopathy.  Neurological:  She is alert.  Skin: Skin is warm and dry. No rash noted.  Psychiatric: She has a normal mood and affect. Her behavior is normal.     Labs reviewed: Admission on 09/25/2015, Discharged on 09/25/2015  Component Date Value Ref Range Status  . Sodium 09/25/2015 143  135 - 145 mmol/L Final  . Potassium 09/25/2015 4.2  3.5 - 5.1 mmol/L Final  . Chloride 09/25/2015 104  101 - 111 mmol/L Final  . BUN 09/25/2015 29* 6 - 20 mg/dL Final  . Creatinine, Ser 09/25/2015 0.90  0.44 - 1.00 mg/dL Final  . Glucose, Bld 53/66/440310/02/2015 87  65 - 99 mg/dL Final  . Calcium, Ion 47/42/595610/02/2015 1.21  1.13 - 1.30 mmol/L Final  . TCO2 09/25/2015 26  0 - 100 mmol/L Final  . Hemoglobin 09/25/2015 15.3* 12.0 - 15.0 g/dL Final  . HCT 38/75/643310/02/2015 45.0  36.0 - 46.0 % Final  . WBC 09/25/2015 7.7  4.0 - 10.5 K/uL Final  . RBC 09/25/2015 4.65  3.87 - 5.11 MIL/uL Final  . Hemoglobin 09/25/2015 13.2  12.0 - 15.0 g/dL Final  . HCT 29/51/884110/02/2015 39.8  36.0 - 46.0 % Final  . MCV 09/25/2015 85.6  78.0 - 100.0 fL Final  . MCH 09/25/2015 28.4  26.0 - 34.0 pg Final  . MCHC 09/25/2015 33.2  30.0 - 36.0 g/dL Final  . RDW 66/06/301610/02/2015 15.0  11.5 - 15.5 %  Final  . Platelets 09/25/2015 286  150 - 400 K/uL Final  . Neutrophils Relative % 09/25/2015 76   Final  . Neutro Abs 09/25/2015 5.9  1.7 - 7.7 K/uL Final  . Lymphocytes Relative 09/25/2015 10   Final  . Lymphs Abs 09/25/2015 0.8  0.7 - 4.0 K/uL Final  . Monocytes Relative 09/25/2015 11   Final  . Monocytes Absolute 09/25/2015 0.9  0.1 - 1.0 K/uL Final  . Eosinophils Relative 09/25/2015 2   Final  . Eosinophils Absolute 09/25/2015 0.1  0.0 - 0.7 K/uL Final  . Basophils Relative 09/25/2015 1   Final  . Basophils Absolute 09/25/2015 0.1  0.0 - 0.1 K/uL Final  . Troponin I 09/25/2015 <0.03  <0.031 ng/mL Final   Comment:        NO INDICATION OF MYOCARDIAL INJURY.   . Color, Urine 09/25/2015 YELLOW  YELLOW Final  . APPearance 09/25/2015 CLEAR  CLEAR Final  . Specific Gravity, Urine  09/25/2015 1.023  1.005 - 1.030 Final  . pH 09/25/2015 5.5  5.0 - 8.0 Final  . Glucose, UA 09/25/2015 NEGATIVE  NEGATIVE mg/dL Final  . Hgb urine dipstick 09/25/2015 SMALL* NEGATIVE Final  . Bilirubin Urine 09/25/2015 NEGATIVE  NEGATIVE Final  . Ketones, ur 09/25/2015 NEGATIVE  NEGATIVE mg/dL Final  . Protein, ur 16/09/9603 NEGATIVE  NEGATIVE mg/dL Final  . Urobilinogen, UA 09/25/2015 0.2  0.0 - 1.0 mg/dL Final  . Nitrite 54/08/8118 NEGATIVE  NEGATIVE Final  . Leukocytes, UA 09/25/2015 NEGATIVE  NEGATIVE Final  . Squamous Epithelial / LPF 09/25/2015 RARE  RARE Final  . RBC / HPF 09/25/2015 0-2  <3 RBC/hpf Final  . Bacteria, UA 09/25/2015 RARE  RARE Final    Lipid Panel     Component Value Date/Time   CHOL 128 09/26/2015   TRIG 43 09/26/2015   HDL 46 09/26/2015   CHOLHDL 3.2 12/29/2011 0500   VLDL 14 12/29/2011 0500   LDLCALC 73 09/26/2015   LDLDIRECT 107* 04/29/2013 1605   Albumin   3.4   No results found.   Assessment/Plan   ICD-9-CM ICD-10-CM   1. Vascular dementia without behavioral disturbance 290.40 F01.50   2. Diastolic CHF, chronic (HCC) 428.32 I50.32    428.0    3. Type 2 diabetes mellitus with stage 3 chronic kidney disease, without long-term current use of insulin (HCC) 250.40 E11.22    585.3 N18.3   4. Hyperlipidemia with target LDL less than 70 272.4 E78.5   5. Primary osteoarthritis involving multiple joints 715.09 M15.0   6. Coronary artery disease involving native coronary artery with other forms of angina pectoris (HCC)  I25.118     Pt is medically stable on current tx plan. Continue current medications as ordered. PT/OT/ST as indicated. Will follow  Kadesha Virrueta S. Ancil Linsey  [facility] [address] [place], [place] [address] [phone] Cell (Monday-Friday 8 AM - 5 PM) [phone] After 5 PM and follow prompts

## 2015-12-11 ENCOUNTER — Encounter: Payer: Self-pay | Admitting: Internal Medicine

## 2015-12-11 ENCOUNTER — Non-Acute Institutional Stay (SKILLED_NURSING_FACILITY): Payer: Medicare Other | Admitting: Internal Medicine

## 2015-12-11 DIAGNOSIS — M159 Polyosteoarthritis, unspecified: Secondary | ICD-10-CM

## 2015-12-11 DIAGNOSIS — I5032 Chronic diastolic (congestive) heart failure: Secondary | ICD-10-CM

## 2015-12-11 DIAGNOSIS — F419 Anxiety disorder, unspecified: Secondary | ICD-10-CM | POA: Diagnosis not present

## 2015-12-11 DIAGNOSIS — F0150 Vascular dementia without behavioral disturbance: Secondary | ICD-10-CM

## 2015-12-11 DIAGNOSIS — M150 Primary generalized (osteo)arthritis: Secondary | ICD-10-CM | POA: Diagnosis not present

## 2015-12-11 DIAGNOSIS — I25118 Atherosclerotic heart disease of native coronary artery with other forms of angina pectoris: Secondary | ICD-10-CM | POA: Diagnosis not present

## 2015-12-11 DIAGNOSIS — E785 Hyperlipidemia, unspecified: Secondary | ICD-10-CM

## 2015-12-11 DIAGNOSIS — E1122 Type 2 diabetes mellitus with diabetic chronic kidney disease: Secondary | ICD-10-CM | POA: Diagnosis not present

## 2015-12-11 DIAGNOSIS — I10 Essential (primary) hypertension: Secondary | ICD-10-CM

## 2015-12-11 DIAGNOSIS — N183 Chronic kidney disease, stage 3 (moderate): Secondary | ICD-10-CM | POA: Diagnosis not present

## 2015-12-11 DIAGNOSIS — I471 Supraventricular tachycardia, unspecified: Secondary | ICD-10-CM

## 2016-01-29 LAB — CBC AND DIFFERENTIAL
HCT: 38 % (ref 36–46)
Hemoglobin: 12.3 g/dL (ref 12.0–16.0)
PLATELETS: 295 10*3/uL (ref 150–399)
WBC: 6.8 10^3/mL

## 2016-01-29 LAB — BASIC METABOLIC PANEL
BUN: 17 mg/dL (ref 4–21)
Glucose: 104 mg/dL
POTASSIUM: 4.3 mmol/L (ref 3.4–5.3)
Sodium: 140 mmol/L (ref 137–147)

## 2016-01-29 LAB — LIPID PANEL
CHOLESTEROL: 150 mg/dL (ref 0–200)
HDL: 41 mg/dL (ref 35–70)
LDL Cholesterol: 92 mg/dL
Triglycerides: 87 mg/dL (ref 40–160)

## 2016-02-05 ENCOUNTER — Encounter: Payer: Self-pay | Admitting: Internal Medicine

## 2016-02-05 ENCOUNTER — Non-Acute Institutional Stay (SKILLED_NURSING_FACILITY): Payer: Medicare Other | Admitting: Internal Medicine

## 2016-02-05 DIAGNOSIS — F419 Anxiety disorder, unspecified: Secondary | ICD-10-CM

## 2016-02-05 DIAGNOSIS — F0150 Vascular dementia without behavioral disturbance: Secondary | ICD-10-CM | POA: Diagnosis not present

## 2016-02-05 DIAGNOSIS — E785 Hyperlipidemia, unspecified: Secondary | ICD-10-CM

## 2016-02-05 DIAGNOSIS — I471 Supraventricular tachycardia: Secondary | ICD-10-CM | POA: Diagnosis not present

## 2016-02-05 DIAGNOSIS — M150 Primary generalized (osteo)arthritis: Secondary | ICD-10-CM | POA: Diagnosis not present

## 2016-02-05 DIAGNOSIS — N183 Chronic kidney disease, stage 3 (moderate): Secondary | ICD-10-CM | POA: Diagnosis not present

## 2016-02-05 DIAGNOSIS — I5032 Chronic diastolic (congestive) heart failure: Secondary | ICD-10-CM

## 2016-02-05 DIAGNOSIS — E1122 Type 2 diabetes mellitus with diabetic chronic kidney disease: Secondary | ICD-10-CM | POA: Diagnosis not present

## 2016-02-05 DIAGNOSIS — M159 Polyosteoarthritis, unspecified: Secondary | ICD-10-CM

## 2016-02-05 NOTE — Progress Notes (Signed)
Patient ID: [name], female   DOB: 16-Jul-1920, 82 y.o.   MRN: [id number]    DATE: 02/05/16  Location:  [facility]  Nursing Home Room Number: 127A Place of Service: SNF (31)   Extended Emergency Contact Information Primary Emergency Contact: Brown,[name] [address] [place], [place] Home Phone: [phone] Work Phone: [phone] Relation: Other  Advanced Directive information Does patient have an advance directive?: Yes, Type of Advance Directive: Out of facility DNR (pink MOST or yellow form), Does patient want to make changes to advanced directive?: No - Patient declined  Chief Complaint  Patient presents with  . Medical Management of Chronic Issues    Routine Visit; [facility]    HPI:  82 yo female long term resident seen today for f/u. She has no c/o. No nursing issues. No falls. She is eating with no problems. Sleeping well. She is a poor historian due to dementia. Hx obtained from chart.  CHF/PSVT/CAD - rate controlled on cardizem and lopressor. Takes ASA daily. No CP but has prn SLNTG if need it. Edema stable on lasix  Hyperlipidemia - diet controlled  PAD - stable. Takes ASA daily  CKD - stable Cr  OA- pain stable on tylenol  DM - diet controlled  Dementia/anxiety/insomnia - dementia stable on namenda xr and  aricept. Mood stable on sertraline and lorazepam. Takes melatonin and trazodone at night to help sleep. She takes several supplements  Past Medical History  Diagnosis Date  . CVA (cerebral vascular accident) (HCC)     a. She's not sure that this is true.  [name] CAD (coronary artery disease)     a. reports prior h/o MI's with possible cath @ [facility] in [place] 'many years ago.'  . Vitamin D deficiency   . Diabetes mellitus, type 2 (HCC)   . HTN (hypertension)   . Tremor, essential   . Urinary incontinence   . High risk social situations 11/28/2011  . Intention tremor 03/03/2007    Qualifier: Diagnosis of  By:  Dorathy Daft  MD, IDAYLIS    . Syncope, near 02/28/2012  . Altered mental status 01/01/2011    Qualifier: Diagnosis of  By: Earnest Bailey MD, Selena Batten    . DEFICIENCY, VITAMIN D NOS 07/16/2007    Qualifier: Diagnosis of  By: Irving Burton MD, Clifton Custard    . Asthma   . History of pneumonia   . Recurrent upper respiratory infection (URI)   . Hyperthyroidism   . Anemia   . CKD (chronic kidney disease), stage II   . H/O hiatal hernia   . Seizures (HCC)   . Headache(784.0)   . Neuromuscular disorder (HCC)   . Arthritis   . Fibromyalgia   . CAROTID ARTERY DISEASE 07/16/2007    a. 07/2012 U/S: no signif extracranial carotid artery stenosis, antegrad vertebral flow.  . Diastolic CHF, chronic (HCC)     a. 07/2013 Echo: EF 55-60%.  . TOBACCO USER   . Palpitations     a. 8.2014 Echo: EF 55-60%, no rwma, mod LVH, mildly dil LA, PASP .  [name] Systolic murmur     a. no significant valvular abnormalities on echo 8.2014.  [name] HLD (hyperlipidemia)   . Anxiety 03/09/2015    Past Surgical History  Procedure Laterality Date  . Appendectomy  1965  . Vaginal hysterectomy  1965  . Knee surgery  1989, 1995    Patient Care Team: Yolande Jolly, MD as PCP -  General (Family Medicine)  Social History   Social History  . Marital Status: Widowed    Spouse Name: N/A  . Number of Children: N/A  . Years of Education: N/A   Occupational History  . Retired     from [place] [employer]   Social History Main Topics  . Smoking status: Current Some Day Smoker -- 0.50 packs/day    Types: Cigarettes    Last Attempt to Quit: 04/06/2012  . Smokeless tobacco: Never Used     Comment: does not smoke much...  . Alcohol Use: No  . Drug Use: No  . Sexual Activity: No   Other Topics Concern  . Not on file   Social History Narrative   Lives at home alone. Widowed.  Husband was in the [employer].  Able to cook, bathe and cleans by herself. Walks with cane and crutch. Has a wheelchair but not able to use it.   Does not have anyone  she can really trust. Has a friend that works at the [employer] that stops by to help some.   Lives off social security, retired from [employer] in 1988.   After mortgage, has $400 left.      Does not have a POA if anything should happen to her. Has 2 nieces, 2 nephews that live far away. (Only one nephew "knows that I am alive")      She does want CPR attempted but does not wish to have too much done. Organ donor/donating body to science.       Diet consists mostly of greens and veggies. Little meat, must force herself to eat meat.      Smokes 2-4 cigs per day.      reports that she has been smoking Cigarettes.  She has been smoking about 0.50 packs per day. She has never used smokeless tobacco. She reports that she does not drink alcohol or use illicit drugs.  Immunization History  Administered Date(s) Administered  . Influenza-Unspecified 09/28/2014, 10/04/2015  . PPD Test 07/15/2014  . Pneumococcal Polysaccharide-23 09/22/1994  . Pneumococcal-Unspecified 08/05/2014  . Td 05/23/2004  . Tdap 02/25/2015    Allergies  Allergen Reactions  . Codeine Other (See Comments)    Pt states that it knocks her out.   [name] Penicillins Rash    Hives, upset stomach     Medications: Patient's Medications  New Prescriptions   No medications on file  Previous Medications   ACETAMINOPHEN (TYLENOL) 325 MG TABLET    Take 650 mg by mouth 3 (three) times daily.   ASPIRIN 81 MG TABLET    Take 81 mg by mouth daily.   CALCIUM & MAGNESIUM CARBONATES (MYLANTA PO)    Take by mouth. Mylanta 30 ml by mouth with nitroglycerin for chest pain   DILTIAZEM (CARDIZEM) 90 MG TABLET    Take 90 mg by mouth 2 (two) times daily.   DONEPEZIL (ARICEPT) 10 MG TABLET    Take 10 mg by mouth at bedtime.   FUROSEMIDE (LASIX) 20 MG TABLET    Take 20 mg by mouth every other day.   LORAZEPAM (ATIVAN) 0.5 MG TABLET    Take one tablet by mouth twice daily for anxiety   MELATONIN 3 MG CAPS    Take 3 mg by  mouth at bedtime.   MEMANTINE (NAMENDA XR) 28 MG CP24 24 HR CAPSULE    Take 28 mg by mouth daily.   METOPROLOL (LOPRESSOR) 100 MG TABLET    Take 1 tablet (  100 mg total) by mouth 2 (two) times daily.   NITROGLYCERIN (NITROSTAT) 0.4 MG SL TABLET    Place 0.4 mg under the tongue every 5 (five) minutes as needed for chest pain.   POLYETHYLENE GLYCOL (MIRALAX / GLYCOLAX) PACKET    Take 17 g by mouth daily.   PROPYLENE GLYCOL (SYSTANE BALANCE) 0.6 % SOLN    Place 1 drop into both eyes 2 (two) times daily.   PSEUDOEPHEDRINE-DEXTROMETHORPHAN-GUAIFENESIN (ROBITUSSIN-PE) 30-10-100 MG/5ML SOLUTION    Take 10 mLs by mouth 2 (two) times daily as needed for cough.   SERTRALINE (ZOLOFT) 50 MG TABLET    Take 50 mg by mouth daily.   TRAZODONE (DESYREL) 50 MG TABLET    Take 50 mg by mouth at bedtime as needed for sleep.    VITAMIN B-12 (CYANOCOBALAMIN) 1000 MCG TABLET    Take 1,000 mcg by mouth every other day.    VITAMIN C (ASCORBIC ACID) 500 MG TABLET    Take 500 mg by mouth daily.   VITAMIN D, ERGOCALCIFEROL, (DRISDOL) 50000 UNITS CAPS CAPSULE    Take 50,000 Units by mouth every 30 (thirty) days.  Modified Medications   No medications on file  Discontinued Medications   No medications on file    Review of Systems  Unable to perform ROS: Dementia    Filed Vitals:   02/05/16 1422  BP: 118/74  Pulse: 55  Temp: 97 F (36.1 C)  TempSrc: Oral  Resp: 18  Height:  (1.626 m)  Weight: 117 lb (53.071 kg)   Body mass index is 20.07 kg/(m^2).  Physical Exam  Constitutional: She appears well-developed.  Sitting in w/c, frail appearing in NAD  HENT:  Mouth/Throat: Oropharynx is clear and moist. No oropharyngeal exudate.  Eyes: Pupils are equal, round, and reactive to light. No scleral icterus.  Neck: Neck supple. Carotid bruit is not present. No tracheal deviation present.  Cardiovascular: Normal rate, regular rhythm and intact distal pulses.  Exam reveals no gallop and no friction rub.   Murmur  (1/6 SEM) heard. No LE edema b/l. no calf TTP.   Pulmonary/Chest: Effort normal and breath sounds normal. No stridor. No respiratory distress. She has no wheezes. She has no rales.  Abdominal: Soft. Bowel sounds are normal. She exhibits no distension and no mass. There is no hepatomegaly. There is no tenderness. There is no rebound and no guarding.  Musculoskeletal: She exhibits edema.  Lymphadenopathy:    She has no cervical adenopathy.  Neurological: She is alert.  Skin: Skin is warm and dry. No rash noted.  Psychiatric: Her behavior is normal. Her mood appears anxious.     Labs reviewed: Nursing Home on 12/11/2015  Component Date Value Ref Range Status  . Hemoglobin 09/26/2015 12.7  12.0 - 16.0 g/dL Final  . HCT 16/09/9603 39  36 - 46 % Final  . Platelets 09/26/2015 263  150 - 399 K/L Final  . WBC 09/26/2015 7.6   Final  . Glucose 09/26/2015 106   Final  . BUN 09/26/2015 20  4 - 21 mg/dL Final  . Creatinine 54/08/8118 0.8  .5 - 1.1 mg/dL Final  . Sodium 14/78/2956 141  137 - 147 mmol/L Final  . ALT 09/26/2015 17  7 - 35 U/L Final  . AST 09/26/2015 15  13 - 35 U/L Final  . Bilirubin, Total 09/26/2015 0.3   Final  . Hemoglobin A1C 09/26/2015 6.6   Final  Nursing Home on 11/13/2015  Component Date Value Ref Range  Status  . Triglycerides 09/26/2015 43  40 - 160 mg/dL Final  . Cholesterol 16/09/9603 128  0 - 200 mg/dL Final  . HDL 54/08/8118 46  35 - 70 mg/dL Final  . LDL Cholesterol 09/26/2015 73   Final    No results found.   Assessment/Plan   ICD-9-CM ICD-10-CM   1. Vascular dementia without behavioral disturbance 290.40 F01.50   2. Primary osteoarthritis involving multiple joints 715.09 M15.0   3. Type 2 diabetes mellitus with stage 3 chronic kidney disease, without long-term current use of insulin (HCC) 250.40 E11.22    585.3 N18.3   4. Diastolic CHF, chronic (HCC) 428.32 I50.32    428.0    5. Hyperlipidemia with target LDL less than 70 272.4 E78.5   6. PSVT  (paroxysmal supraventricular tachycardia) (HCC) 427.0 I47.1   7. Anxiety 300.00 F41.9    T/c namzeric to reduce pill burden  Pt is medically stable on current tx plan. Continue current medications as ordered. PT/OT/ST as indicated. Will follow  [facility] to follow  [facility] S. Ancil Linsey  [facility] and Adult Medicine [address] [place], [place] [address] [phone] Cell (Monday-Friday 8 AM - 5 PM) [phone] After 5 PM and follow prompts

## 2016-02-25 LAB — MICROALBUMIN, URINE: MICROALB UR: 0.6

## 2016-02-25 LAB — HEMOGLOBIN A1C: Hemoglobin A1C: 6.5

## 2016-03-25 ENCOUNTER — Non-Acute Institutional Stay (SKILLED_NURSING_FACILITY): Payer: Medicare Other | Admitting: Internal Medicine

## 2016-03-25 ENCOUNTER — Encounter: Payer: Self-pay | Admitting: Internal Medicine

## 2016-03-25 DIAGNOSIS — E785 Hyperlipidemia, unspecified: Secondary | ICD-10-CM

## 2016-03-25 DIAGNOSIS — I5032 Chronic diastolic (congestive) heart failure: Secondary | ICD-10-CM

## 2016-03-25 DIAGNOSIS — F0150 Vascular dementia without behavioral disturbance: Secondary | ICD-10-CM

## 2016-03-25 DIAGNOSIS — I471 Supraventricular tachycardia: Secondary | ICD-10-CM | POA: Diagnosis not present

## 2016-03-25 DIAGNOSIS — D509 Iron deficiency anemia, unspecified: Secondary | ICD-10-CM

## 2016-03-25 DIAGNOSIS — F419 Anxiety disorder, unspecified: Secondary | ICD-10-CM | POA: Diagnosis not present

## 2016-03-25 DIAGNOSIS — N183 Chronic kidney disease, stage 3 (moderate): Secondary | ICD-10-CM | POA: Diagnosis not present

## 2016-03-25 DIAGNOSIS — E1122 Type 2 diabetes mellitus with diabetic chronic kidney disease: Secondary | ICD-10-CM

## 2016-03-25 DIAGNOSIS — M150 Primary generalized (osteo)arthritis: Secondary | ICD-10-CM | POA: Diagnosis not present

## 2016-03-25 DIAGNOSIS — M159 Polyosteoarthritis, unspecified: Secondary | ICD-10-CM

## 2016-03-25 NOTE — Progress Notes (Signed)
Patient ID: [name], female   DOB: April 21, 1920, 82 y.o.   MRN: [id number]    DATE: 03/25/16  Location:  [facility]  Nursing Home Room Number: 118 A Place of Service: SNF (31)   Extended Emergency Contact Information Primary Emergency Contact: Brown,[name] [address], [place] Home Phone: [phone] Work Phone: [phone] Relation: Other  Advanced Directive information Does patient have an advance directive?: Yes, Type of Advance Directive: Out of facility DNR (pink MOST or yellow form), Does patient want to make changes to advanced directive?: No - Patient declined  Chief Complaint  Patient presents with  . Medical Management of Chronic Issues    Routine Visit; [facility]    HPI:  82 yo female long term resident seen today for f/u. She c/o fatigue and feeling cold 'all the time'. She is a poor historian due to dementia. Hx obtained from chart.  CHF/PSVT/CAD - rate controlled on cardizem and lopressor. Takes ASA daily. No CP but has prn SLNTG if need it. Edema stable on lasix  Hyperlipidemia - diet controlled  PAD - stable. Takes ASA daily  CKD - stable Cr  OA- pain stable on tylenol  DM - diet controlled  Dementia/anxiety/insomnia - dementia stable on namenda xr and  aricept. Mood stable on sertraline and lorazepam. Takes melatonin and trazodone at night to help sleep. She takes several supplements  Fe deficiency anemia - stable on iron and B12. Hgb 12.3   Past Medical History  Diagnosis Date  . CVA (cerebral vascular accident) (HCC)     a. She's not sure that this is true.  [name] CAD (coronary artery disease)     a. reports prior h/o MI's with possible cath @ [facility] in [place] 'many years ago.'  . Vitamin D deficiency   . Diabetes mellitus, type 2 (HCC)   . HTN (hypertension)   . Tremor, essential   . Urinary incontinence   . High risk social situations 11/28/2011  . Intention tremor 03/03/2007    Qualifier:  Diagnosis of  By: Dorathy Daft  MD, IDAYLIS    . Syncope, near 02/28/2012  . Altered mental status 01/01/2011    Qualifier: Diagnosis of  By: Earnest Bailey MD, Selena Batten    . DEFICIENCY, VITAMIN D NOS 07/16/2007    Qualifier: Diagnosis of  By: Irving Burton MD, Clifton Custard    . Asthma   . History of pneumonia   . Recurrent upper respiratory infection (URI)   . Hyperthyroidism   . Anemia   . CKD (chronic kidney disease), stage II   . H/O hiatal hernia   . Seizures (HCC)   . Headache(784.0)   . Neuromuscular disorder (HCC)   . Arthritis   . Fibromyalgia   . CAROTID ARTERY DISEASE 07/16/2007    a. 07/2012 U/S: no signif extracranial carotid artery stenosis, antegrad vertebral flow.  . Diastolic CHF, chronic (HCC)     a. 07/2013 Echo: EF 55-60%.  . TOBACCO USER   . Palpitations     a. 8.2014 Echo: EF 55-60%, no rwma, mod LVH, mildly dil LA, PASP .  [name] Systolic murmur     a. no significant valvular abnormalities on echo 8.2014.  [name] HLD (hyperlipidemia)   . Anxiety 03/09/2015    Past Surgical History  Procedure Laterality Date  . Appendectomy  1965  . Vaginal hysterectomy  1965  . Knee surgery  1989, 1995    Patient Care Team: [facility]  Montez Morita, DO as PCP - General (Internal Medicine)  Social History   Social History  . Marital Status: Widowed    Spouse Name: N/A  . Number of Children: N/A  . Years of Education: N/A   Occupational History  . Retired     from [place] [employer]   Social History Main Topics  . Smoking status: Current Some Day Smoker -- 0.50 packs/day    Types: Cigarettes    Last Attempt to Quit: 04/06/2012  . Smokeless tobacco: Never Used     Comment: does not smoke much...  . Alcohol Use: No  . Drug Use: No  . Sexual Activity: No   Other Topics Concern  . Not on file   Social History Narrative   Lives at home alone. Widowed.  Husband was in the [employer].  Able to cook, bathe and cleans by herself. Walks with cane and crutch. Has a wheelchair but not able to use it.   Does  not have anyone she can really trust. Has a friend that works at the [employer] that stops by to help some.   Lives off social security, retired from [employer] in 1988.   After mortgage, has $400 left.      Does not have a POA if anything should happen to her. Has 2 nieces, 2 nephews that live far away. (Only one nephew "knows that I am alive")      She does want CPR attempted but does not wish to have too much done. Organ donor/donating body to science.       Diet consists mostly of greens and veggies. Little meat, must force herself to eat meat.      Smokes 2-4 cigs per day.      reports that she has been smoking Cigarettes.  She has been smoking about 0.50 packs per day. She has never used smokeless tobacco. She reports that she does not drink alcohol or use illicit drugs.  Immunization History  Administered Date(s) Administered  . Influenza-Unspecified 09/28/2014, 10/04/2015  . PPD Test 07/15/2014  . Pneumococcal Polysaccharide-23 09/22/1994  . Pneumococcal-Unspecified 08/05/2014  . Td 05/23/2004  . Tdap 02/25/2015    Allergies  Allergen Reactions  . Codeine Other (See Comments)    Pt states that it knocks her out.   [name] Penicillins Rash    Hives, upset stomach     Medications: Patient's Medications  New Prescriptions   No medications on file  Previous Medications   ACETAMINOPHEN (TYLENOL) 325 MG TABLET    Take 650 mg by mouth 3 (three) times daily.   ASPIRIN 81 MG TABLET    Take 81 mg by mouth daily.   CALCIUM & MAGNESIUM CARBONATES (MYLANTA PO)    Take by mouth. Mylanta 30 ml by mouth with nitroglycerin for chest pain   DILTIAZEM (CARDIZEM) 90 MG TABLET    Take 90 mg by mouth 2 (two) times daily.   DONEPEZIL (ARICEPT) 10 MG TABLET    Take 10 mg by mouth at bedtime.   FERROUS SULFATE 325 (65 FE) MG TABLET    Take 325 mg by mouth daily with breakfast.   FUROSEMIDE (LASIX) 20 MG TABLET    Take 20 mg by mouth every other day.   LORAZEPAM (ATIVAN)  0.5 MG TABLET    Take one tablet by mouth twice daily for anxiety   MELATONIN 3 MG CAPS    Take 3 mg by mouth at bedtime.   MEMANTINE (NAMENDA XR) 28 MG CP24  24 HR CAPSULE    Take 28 mg by mouth daily.   METOPROLOL (LOPRESSOR) 100 MG TABLET    Take 1 tablet (100 mg total) by mouth 2 (two) times daily.   NITROGLYCERIN (NITROSTAT) 0.4 MG SL TABLET    Place 0.4 mg under the tongue every 5 (five) minutes as needed for chest pain.   POLYETHYLENE GLYCOL (MIRALAX / GLYCOLAX) PACKET    Take 17 g by mouth daily.   PROPYLENE GLYCOL (SYSTANE BALANCE) 0.6 % SOLN    Place 1 drop into both eyes 2 (two) times daily.   PSEUDOEPHEDRINE-DEXTROMETHORPHAN-GUAIFENESIN (ROBITUSSIN-PE) 30-10-100 MG/5ML SOLUTION    Take 10 mLs by mouth 2 (two) times daily as needed for cough.   SERTRALINE (ZOLOFT) 50 MG TABLET    Take 50 mg by mouth daily.   TRAZODONE (DESYREL) 50 MG TABLET    Take 50 mg by mouth at bedtime as needed for sleep.    VITAMIN B-12 (CYANOCOBALAMIN) 1000 MCG TABLET    Take 1,000 mcg by mouth every other day.    VITAMIN C (ASCORBIC ACID) 500 MG TABLET    Take 500 mg by mouth daily.   VITAMIN D, ERGOCALCIFEROL, (DRISDOL) 50000 UNITS CAPS CAPSULE    Take 50,000 Units by mouth every 30 (thirty) days.  Modified Medications   No medications on file  Discontinued Medications   No medications on file    Review of Systems  Unable to perform ROS: Dementia    Filed Vitals:   03/25/16 1553  BP: 142/56  Pulse: 58  Temp: 98.6 F (37 C)  TempSrc: Oral  Resp: 17  Height: 5\' 4"  (1.626 m)  Weight: 117 lb 9.6 oz (53.343 kg)   Body mass index is 20.18 kg/(m^2).  Physical Exam  Constitutional: She appears well-developed.  Lying in bed asleep but easily awakened, frail appearing in NAD  HENT:  Mouth/Throat: Oropharynx is clear and moist. No oropharyngeal exudate.  Eyes: Pupils are equal, round, and reactive to light. No scleral icterus.  Neck: Neck supple. Carotid bruit is not present. No tracheal deviation  present.  Cardiovascular: Regular rhythm and intact distal pulses.  Bradycardia present.  Exam reveals no gallop and no friction rub.   Murmur (1/6 SEM) heard. No LE edema b/l. no calf TTP.   Pulmonary/Chest: Effort normal and breath sounds normal. No stridor. No respiratory distress. She has no wheezes. She has no rales.  Abdominal: Soft. Bowel sounds are normal. She exhibits no distension and no mass. There is no hepatomegaly. There is no tenderness. There is no rebound and no guarding.  Musculoskeletal: She exhibits edema.  Lymphadenopathy:    She has no cervical adenopathy.  Neurological: She is alert.  Skin: Skin is warm and dry. No rash noted.  Left anterior leg wound dsg intact  Psychiatric: Her behavior is normal. Her mood appears anxious.     Labs reviewed: Nursing Home on 03/25/2016  Component Date Value Ref Range Status  . HM Diabetic Eye Exam 07/31/2015 No Retinopathy  No Retinopathy Final  . Microalb, Ur 02/25/2016 0.6   Final  . Hemoglobin A1C 02/25/2016 6.5   Final  . Hemoglobin 01/29/2016 12.3  12.0 - 16.0 g/dL Final  . HCT 16/10/960402/05/2016 38  36 - 46 % Final  . Platelets 01/29/2016 295  150 - 399 K/L Final  . WBC 01/29/2016 6.8   Final  . Glucose 01/29/2016 104   Final  . BUN 01/29/2016 17  4 - 21 mg/dL Final  . Potassium  01/29/2016 4.3  3.4 - 5.3 mmol/L Final  . Sodium 01/29/2016 140  137 - 147 mmol/L Final  . Triglycerides 01/29/2016 87  40 - 160 mg/dL Final  . Cholesterol 53/66/4403 150  0 - 200 mg/dL Final  . HDL 47/42/5956 41  35 - 70 mg/dL Final  . LDL Cholesterol 01/29/2016 92   Final    No results found.   Assessment/Plan   ICD-9-CM ICD-10-CM   1. Vascular dementia without behavioral disturbance 290.40 F01.50   2. Anxiety 300.00 F41.9   3. Type 2 diabetes mellitus with stage 3 chronic kidney disease, without long-term current use of insulin (HCC) 250.40 E11.22    585.3 N18.3   4. Primary osteoarthritis involving multiple joints 715.09 M15.0   5.  Diastolic CHF, chronic (HCC) 428.32 I50.32    428.0    6. Hyperlipidemia with target LDL less than 70 272.4 E78.5   7. PSVT (paroxysmal supraventricular tachycardia) (HCC) 427.0 I47.1   8. Anemia, iron deficiency 280.9 D50.9     [facility] NP to follow  Pt is medically stable on current tx plan. Continue current medications as ordered. PT/OT/ST as indicated. Will follow  Lyell Clugston S. Ancil Linsey  [facility] and Adult Medicine [address] [place], [place] [address] [phone] Cell (Monday-Friday 8 AM - 5 PM) [phone] After 5 PM and follow prompts

## 2016-04-25 ENCOUNTER — Non-Acute Institutional Stay (SKILLED_NURSING_FACILITY): Payer: Medicare Other | Admitting: Internal Medicine

## 2016-04-25 ENCOUNTER — Encounter: Payer: Self-pay | Admitting: Internal Medicine

## 2016-04-25 DIAGNOSIS — F419 Anxiety disorder, unspecified: Secondary | ICD-10-CM | POA: Diagnosis not present

## 2016-04-25 DIAGNOSIS — N183 Chronic kidney disease, stage 3 unspecified: Secondary | ICD-10-CM

## 2016-04-25 DIAGNOSIS — E785 Hyperlipidemia, unspecified: Secondary | ICD-10-CM

## 2016-04-25 DIAGNOSIS — F0150 Vascular dementia without behavioral disturbance: Secondary | ICD-10-CM | POA: Diagnosis not present

## 2016-04-25 DIAGNOSIS — I471 Supraventricular tachycardia: Secondary | ICD-10-CM

## 2016-04-25 DIAGNOSIS — E1122 Type 2 diabetes mellitus with diabetic chronic kidney disease: Secondary | ICD-10-CM | POA: Diagnosis not present

## 2016-04-25 DIAGNOSIS — M150 Primary generalized (osteo)arthritis: Secondary | ICD-10-CM

## 2016-04-25 DIAGNOSIS — I10 Essential (primary) hypertension: Secondary | ICD-10-CM | POA: Diagnosis not present

## 2016-04-25 DIAGNOSIS — M159 Polyosteoarthritis, unspecified: Secondary | ICD-10-CM

## 2016-04-25 DIAGNOSIS — I5032 Chronic diastolic (congestive) heart failure: Secondary | ICD-10-CM

## 2016-04-25 NOTE — Progress Notes (Signed)
Patient ID: [name], female   DOB: [birth year], 82 y.o.   MRN: [id number]    DATE: 04/25/16  Location:  [facility]    Place of Service: SNF (31)   Extended Emergency Contact Information Primary Emergency Contact: Brown,[name] Address[address] [facility] [address] [place], [place] Home Phone: [phone] Work Phone: [phone] Relation: Other  Advanced Directive information  DNR  Chief Complaint  Patient presents with  . Medical Management of Chronic Issues; [facility]    HPI:  82 yo female seen today for f/u. she c/o fatigue and urinary incontinence. She is a poor historian due to dementia. Hx obtained from chart.  CHF/PSVT/CAD - rate controlled on cardizem and lopressor. Takes ASA daily. No CP but has prn SLNTG if need it. Edema stable on lasix  Hyperlipidemia - diet controlled. LDL 92  PAD - stable. Takes ASA daily  CKD - stable Cr  OA- pain stable on tylenol  DM - diet controlled. A1c 6.5%  Dementia/anxiety/insomnia - dementia stable on namenda xr and  aricept. Mood stable on sertraline and lorazepam. Takes melatonin and trazodone at night to help sleep. She takes several supplements  Fe deficiency anemia - stable on iron and B12. Hgb 12.3    Past Medical History  Diagnosis Date  . CVA (cerebral vascular accident) (HCC)     a. She's not sure that this is true.  [name] CAD (coronary artery disease)     a. reports prior h/o MI's with possible cath @ [facility] in [place] 'many years ago.'  . Vitamin D deficiency   . Diabetes mellitus, type 2 (HCC)   . HTN (hypertension)   . Tremor, essential   . Urinary incontinence   . High risk social situations 11/28/2011  . Intention tremor 03/03/2007    Qualifier: Diagnosis of  By: Dorathy Daft  MD, IDAYLIS    . Syncope, near 02/28/2012  . Altered mental status 01/01/2011    Qualifier: Diagnosis of  By: Earnest Bailey MD, Selena Batten    . DEFICIENCY, VITAMIN D NOS 07/16/2007    Qualifier: Diagnosis of  By: Irving Burton MD, Clifton Custard     . Asthma   . History of pneumonia   . Recurrent upper respiratory infection (URI)   . Hyperthyroidism   . Anemia   . CKD (chronic kidney disease), stage II   . H/O hiatal hernia   . Seizures (HCC)   . Headache(784.0)   . Neuromuscular disorder (HCC)   . Arthritis   . Fibromyalgia   . CAROTID ARTERY DISEASE 07/16/2007    a. 07/2012 U/S: no signif extracranial carotid artery stenosis, antegrad vertebral flow.  . Diastolic CHF, chronic (HCC)     a. 07/2013 Echo: EF 55-60%.  . TOBACCO USER   . Palpitations     a. 8.2014 Echo: EF 55-60%, no rwma, mod LVH, mildly dil LA, PASP .  [name] Systolic murmur     a. no significant valvular abnormalities on echo 8.2014.  [name] HLD (hyperlipidemia)   . Anxiety 03/09/2015    Past Surgical History  Procedure Laterality Date  . Appendectomy  1965  . Vaginal hysterectomy  1965  . Knee surgery  1989, 1995    Patient Care Team: Kirt Boys, DO as PCP - General (Internal Medicine)  Social History   Social History  . Marital Status: Widowed    Spouse Name: N/A  . Number of Children: N/A  . Years of Education: N/A   Occupational  History  . Retired     from [place] [employer]   Social History Main Topics  . Smoking status: Current Some Day Smoker -- 0.50 packs/day    Types: Cigarettes    Last Attempt to Quit: 04/06/2012  . Smokeless tobacco: Never Used     Comment: does not smoke much...  . Alcohol Use: No  . Drug Use: No  . Sexual Activity: No   Other Topics Concern  . Not on file   Social History Narrative   Lives at home alone. Widowed.  Husband was in the [employer].  Able to cook, bathe and cleans by herself. Walks with cane and crutch. Has a wheelchair but not able to use it.   Does not have anyone she can really trust. Has a friend that works at the [employer] that stops by to help some.   Lives off social security, retired from [employer] in 1988.   After mortgage, has $400 left.      Does not have a POA if  anything should happen to her. Has 2 nieces, 2 nephews that live far away. (Only one nephew "knows that I am alive")      She does want CPR attempted but does not wish to have too much done. Organ donor/donating body to science.       Diet consists mostly of greens and veggies. Little meat, must force herself to eat meat.      Smokes 2-4 cigs per day.      reports that she has been smoking Cigarettes.  She has been smoking about 0.50 packs per day. She has never used smokeless tobacco. She reports that she does not drink alcohol or use illicit drugs.  Immunization History  Administered Date(s) Administered  . Influenza-Unspecified 09/28/2014, 10/04/2015  . PPD Test 07/15/2014  . Pneumococcal Polysaccharide-23 09/22/1994  . Pneumococcal-Unspecified 08/05/2014  . Td 05/23/2004  . Tdap 02/25/2015    Allergies  Allergen Reactions  . Codeine Other (See Comments)    Pt states that it knocks her out.   [name] Penicillins Rash    Hives, upset stomach     Medications: Patient's Medications  New Prescriptions   No medications on file  Previous Medications   ACETAMINOPHEN (TYLENOL) 325 MG TABLET    Take 650 mg by mouth 3 (three) times daily.   ASPIRIN 81 MG TABLET    Take 81 mg by mouth daily.   CALCIUM & MAGNESIUM CARBONATES (MYLANTA PO)    Take by mouth. Mylanta 30 ml by mouth with nitroglycerin for chest pain   DILTIAZEM (CARDIZEM) 90 MG TABLET    Take 90 mg by mouth 2 (two) times daily.   DONEPEZIL (ARICEPT) 10 MG TABLET    Take 10 mg by mouth at bedtime.   FERROUS SULFATE 325 (65 FE) MG TABLET    Take 325 mg by mouth daily with breakfast.   FUROSEMIDE (LASIX) 20 MG TABLET    Take 20 mg by mouth every other day.   LORAZEPAM (ATIVAN) 0.5 MG TABLET    Take one tablet by mouth twice daily for anxiety   MELATONIN 3 MG CAPS    Take 3 mg by mouth at bedtime.   MEMANTINE (NAMENDA XR) 28 MG CP24 24 HR CAPSULE    Take 28 mg by mouth daily.   METOPROLOL (LOPRESSOR) 100 MG TABLET    Take 1  tablet (100 mg total) by mouth 2 (two) times daily.   NITROGLYCERIN (NITROSTAT) 0.4 MG SL  TABLET    Place 0.4 mg under the tongue every 5 (five) minutes as needed for chest pain.   POLYETHYLENE GLYCOL (MIRALAX / GLYCOLAX) PACKET    Take 17 g by mouth daily.   PROPYLENE GLYCOL (SYSTANE BALANCE) 0.6 % SOLN    Place 1 drop into both eyes 2 (two) times daily.   PSEUDOEPHEDRINE-DEXTROMETHORPHAN-GUAIFENESIN (ROBITUSSIN-PE) 30-10-100 MG/5ML SOLUTION    Take 10 mLs by mouth 2 (two) times daily as needed for cough.   SERTRALINE (ZOLOFT) 50 MG TABLET    Take 50 mg by mouth daily.   TRAZODONE (DESYREL) 50 MG TABLET    Take 50 mg by mouth at bedtime as needed for sleep.    VITAMIN B-12 (CYANOCOBALAMIN) 1000 MCG TABLET    Take 1,000 mcg by mouth every other day.    VITAMIN C (ASCORBIC ACID) 500 MG TABLET    Take 500 mg by mouth daily.   VITAMIN D, ERGOCALCIFEROL, (DRISDOL) 50000 UNITS CAPS CAPSULE    Take 50,000 Units by mouth every 30 (thirty) days.  Modified Medications   No medications on file  Discontinued Medications   No medications on file    Review of Systems  Unable to perform ROS: Dementia    Filed Vitals:   04/25/16 1550  BP: 123/79  Pulse: 67  Temp: 97.1 F (36.2 C)  Weight: 117 lb 3.2 oz (53.162 kg)   Body mass index is 20.11 kg/(m^2).  Physical Exam  Constitutional: She appears well-developed.  Sitting in w/c,  frail appearing in NAD  HENT:  Mouth/Throat: Oropharynx is clear and moist. No oropharyngeal exudate.  Eyes: Pupils are equal, round, and reactive to light. No scleral icterus.  Neck: Neck supple. Carotid bruit is not present. No tracheal deviation present.  Cardiovascular: Normal rate, regular rhythm and intact distal pulses.  Exam reveals no gallop and no friction rub.   Murmur (1/6 SEM) heard. Trace  RLE edema but no LLE edema. no calf TTP.   Pulmonary/Chest: Effort normal and breath sounds normal. No stridor. No respiratory distress. She has no wheezes. She has no  rales.  Abdominal: Soft. Bowel sounds are normal. She exhibits no distension and no mass. There is no hepatomegaly. There is no tenderness. There is no rebound and no guarding.  Musculoskeletal: She exhibits edema.  Lymphadenopathy:    She has no cervical adenopathy.  Neurological: She is alert.  Skin: Skin is warm and dry. No rash noted.  Left anterior leg wound dsg intact  Psychiatric: Her behavior is normal. Her mood appears anxious.     Labs reviewed: Nursing Home on 03/25/2016  Component Date Value Ref Range Status  . HM Diabetic Eye Exam 07/31/2015 No Retinopathy  No Retinopathy Final  . Microalb, Ur 02/25/2016 0.6   Final  . Hemoglobin A1C 02/25/2016 6.5   Final  . Hemoglobin 01/29/2016 12.3  12.0 - 16.0 g/dL Final  . HCT 16/10/960402/05/2016 38  36 - 46 % Final  . Platelets 01/29/2016 295  150 - 399 K/L Final  . WBC 01/29/2016 6.8   Final  . Glucose 01/29/2016 104   Final  . BUN 01/29/2016 17  4 - 21 mg/dL Final  . Potassium 54/09/811902/05/2016 4.3  3.4 - 5.3 mmol/L Final  . Sodium 01/29/2016 140  137 - 147 mmol/L Final  . Triglycerides 01/29/2016 87  40 - 160 mg/dL Final  . Cholesterol 14/78/295602/05/2016 150  0 - 200 mg/dL Final  . HDL 21/30/865702/05/2016 41  35 - 70 mg/dL Final  .  LDL Cholesterol 01/29/2016 92   Final    No results found.   Assessment/Plan   ICD-9-CM ICD-10-CM   1. Vascular dementia without behavioral disturbance 290.40 F01.50   2. Diastolic CHF, chronic (HCC) 428.32 I50.32    428.0    3. Essential hypertension 401.9 I10   4. Type 2 diabetes mellitus with stage 3 chronic kidney disease, without long-term current use of insulin (HCC) 250.40 E11.22    585.3 N18.3   5. Hyperlipidemia with target LDL less than 70 272.4 E78.5   6. Primary osteoarthritis involving multiple joints 715.09 M15.0   7. PSVT (paroxysmal supraventricular tachycardia) (HCC) 427.0 I47.1   8. Anxiety 300.00 F41.9     Check BMP  [facility] provider to follow  Pt is medically stable on current tx plan. Continue  current medications as ordered. PT/OT/ST as indicated. Will follow   Tauheedah Bok S. Ancil Linsey  [facility] [address] [place], [place] [address] [phone] Cell (Monday-Friday 8 AM - 5 PM) [phone] After 5 PM and follow prompts

## 2016-04-27 LAB — BASIC METABOLIC PANEL
BUN: 27 mg/dL — AB (ref 4–21)
CREATININE: 1.1 mg/dL (ref 0.5–1.1)
Glucose: 92 mg/dL
Potassium: 4.8 mmol/L (ref 3.4–5.3)
Sodium: 140 mmol/L (ref 137–147)

## 2016-05-23 ENCOUNTER — Non-Acute Institutional Stay (SKILLED_NURSING_FACILITY): Payer: Medicare Other | Admitting: Internal Medicine

## 2016-05-23 ENCOUNTER — Encounter: Payer: Self-pay | Admitting: Internal Medicine

## 2016-05-23 DIAGNOSIS — M150 Primary generalized (osteo)arthritis: Secondary | ICD-10-CM

## 2016-05-23 DIAGNOSIS — I471 Supraventricular tachycardia, unspecified: Secondary | ICD-10-CM

## 2016-05-23 DIAGNOSIS — F0150 Vascular dementia without behavioral disturbance: Secondary | ICD-10-CM

## 2016-05-23 DIAGNOSIS — N183 Chronic kidney disease, stage 3 unspecified: Secondary | ICD-10-CM

## 2016-05-23 DIAGNOSIS — E785 Hyperlipidemia, unspecified: Secondary | ICD-10-CM | POA: Diagnosis not present

## 2016-05-23 DIAGNOSIS — I10 Essential (primary) hypertension: Secondary | ICD-10-CM | POA: Diagnosis not present

## 2016-05-23 DIAGNOSIS — I5032 Chronic diastolic (congestive) heart failure: Secondary | ICD-10-CM

## 2016-05-23 DIAGNOSIS — I25118 Atherosclerotic heart disease of native coronary artery with other forms of angina pectoris: Secondary | ICD-10-CM | POA: Diagnosis not present

## 2016-05-23 DIAGNOSIS — E1122 Type 2 diabetes mellitus with diabetic chronic kidney disease: Secondary | ICD-10-CM | POA: Diagnosis not present

## 2016-05-23 DIAGNOSIS — R5383 Other fatigue: Secondary | ICD-10-CM

## 2016-05-23 DIAGNOSIS — M159 Polyosteoarthritis, unspecified: Secondary | ICD-10-CM

## 2016-05-23 DIAGNOSIS — D509 Iron deficiency anemia, unspecified: Secondary | ICD-10-CM | POA: Diagnosis not present

## 2016-05-23 NOTE — Progress Notes (Addendum)
DATE: 05/23/16  Location:  [facility]  Nursing Home Room Number: 114 B Place of Service: SNF (31)   Extended Emergency Contact Information Primary Emergency Contact: Brown,[name] Address[address], [place] Home Phone: [phone] Work Phone: [phone] Relation: Other  Advanced Directive information Does patient have an advance directive?: Yes, Type of Advance Directive: Out of facility DNR (pink MOST or yellow form), Does patient want to make changes to advanced directive?: No - Patient declined  Chief Complaint  Patient presents with  . Medical Management of Chronic Issues    Routine visit/ [facility]    HPI:  82 yo female seen today for f/u. she c/o fatigue and no energy. She is a poor historian due to dementia. Hx obtained from chart. No nursing issues. No falls  CHF/PSVT/CAD - rate controlled on cardizem and lopressor. Takes ASA daily. No CP but has prn SLNTG if need it. Edema stable on lasix  Hyperlipidemia - diet controlled. LDL 92  PAD - stable. Takes ASA daily  CKD - stable. Cr 1.1  OA - pain stable on tylenol. She has multiple joint swelling  DM - diet controlled. A1c 6.5%. Urine microalbumin 0.6  Dementia/anxiety/insomnia - dementia stable on namenda xr and  aricept. Mood stable on sertraline and lorazepam. Takes melatonin at night to help sleep. No longer on trazodone She takes several supplements  Fe deficiency anemia - stable on iron and B12. Hgb 12.3  Constipation - stable on miralax  Past Medical History  Diagnosis Date  . CVA (cerebral vascular accident) (HCC)     a. She's not sure that this is true.  [name] CAD (coronary artery disease)     a. reports prior h/o MI's with possible cath @ [facility] in [place] 'many years ago.'  . Vitamin D deficiency   . Diabetes mellitus, type 2 (HCC)   . HTN (hypertension)   . Tremor, essential   . Urinary incontinence   . High risk social situations 11/28/2011  . Intention  tremor 03/03/2007    Qualifier: Diagnosis of  By: Dorathy Daft  MD, IDAYLIS    . Syncope, near 02/28/2012  . Altered mental status 01/01/2011    Qualifier: Diagnosis of  By: Earnest Bailey MD, Selena Batten    . DEFICIENCY, VITAMIN D NOS 07/16/2007    Qualifier: Diagnosis of  By: Irving Burton MD, Clifton Custard    . Asthma   . History of pneumonia   . Recurrent upper respiratory infection (URI)   . Hyperthyroidism   . Anemia   . CKD (chronic kidney disease), stage II   . H/O hiatal hernia   . Seizures (HCC)   . Headache(784.0)   . Neuromuscular disorder (HCC)   . Arthritis   . Fibromyalgia   . CAROTID ARTERY DISEASE 07/16/2007    a. 07/2012 U/S: no signif extracranial carotid artery stenosis, antegrad vertebral flow.  . Diastolic CHF, chronic (HCC)     a. 07/2013 Echo: EF 55-60%.  . TOBACCO USER   . Palpitations     a. 8.2014 Echo: EF 55-60%, no rwma, mod LVH, mildly dil LA, PASP .  [name] Systolic murmur     a. no significant valvular abnormalities on echo 8.2014.  [name] HLD (hyperlipidemia)   . Anxiety 03/09/2015    Past Surgical History  Procedure Laterality Date  . Appendectomy  1965  . Vaginal hysterectomy  1965  . Knee surgery  1989, 1995    Patient  Care Team: Kirt Boys, DO as PCP - General (Internal Medicine)  Social History   Social History  . Marital Status: Widowed    Spouse Name: N/A  . Number of Children: N/A  . Years of Education: N/A   Occupational History  . Retired     from [place] [employer]   Social History Main Topics  . Smoking status: Current Some Day Smoker -- 0.50 packs/day    Types: Cigarettes    Last Attempt to Quit: 04/06/2012  . Smokeless tobacco: Never Used     Comment: does not smoke much...  . Alcohol Use: No  . Drug Use: No  . Sexual Activity: No   Other Topics Concern  . Not on file   Social History Narrative   Lives at home alone. Widowed.  Husband was in the [employer].  Able to cook, bathe and cleans by herself. Walks with cane and crutch. Has a wheelchair  but not able to use it.   Does not have anyone she can really trust. Has a friend that works at the [employer] that stops by to help some.   Lives off social security, retired from [employer] in 1988.   After mortgage, has $400 left.      Does not have a POA if anything should happen to her. Has 2 nieces, 2 nephews that live far away. (Only one nephew "knows that I am alive")      She does want CPR attempted but does not wish to have too much done. Organ donor/donating body to science.       Diet consists mostly of greens and veggies. Little meat, must force herself to eat meat.      Smokes 2-4 cigs per day.      reports that she has been smoking Cigarettes.  She has been smoking about 0.50 packs per day. She has never used smokeless tobacco. She reports that she does not drink alcohol or use illicit drugs.  Family History  Problem Relation Age of Onset  . Diabetes Mother   . Diabetes Brother   . Stroke Father    No family status information on file.    Immunization History  Administered Date(s) Administered  . Influenza-Unspecified 09/28/2014, 10/04/2015  . PPD Test 07/15/2014  . Pneumococcal Polysaccharide-23 09/22/1994  . Pneumococcal-Unspecified 08/05/2014  . Td 05/23/2004  . Tdap 02/25/2015    Allergies  Allergen Reactions  . Codeine Other (See Comments)    Pt states that it knocks her out.   [name] Penicillins Rash    Hives, upset stomach     Medications: Patient's Medications  New Prescriptions   No medications on file  Previous Medications   ACETAMINOPHEN (TYLENOL) 325 MG TABLET    Take 650 mg by mouth 3 (three) times daily.   ASPIRIN 81 MG TABLET    Take 81 mg by mouth daily.   DILTIAZEM (CARDIZEM) 90 MG TABLET    Take 90 mg by mouth 2 (two) times daily.   DONEPEZIL (ARICEPT) 10 MG TABLET    Take 10 mg by mouth at bedtime.   FERROUS SULFATE 325 (65 FE) MG TABLET    Take 325 mg by mouth daily with breakfast.   FUROSEMIDE (LASIX) 20 MG  TABLET    Take 20 mg by mouth every other day.   LORAZEPAM (ATIVAN) 0.5 MG TABLET    Take 0.5 mg by mouth 4 (four) times daily.   MELATONIN 3 MG CAPS    Take 3  mg by mouth at bedtime.   MEMANTINE (NAMENDA XR) 28 MG CP24 24 HR CAPSULE    Take 28 mg by mouth daily.   METOPROLOL (LOPRESSOR) 50 MG TABLET    Take 1 1/2 tablets ( ) by mouth twice a day. Hold for HR<60 and notify [facility]   NITROGLYCERIN (NITROSTAT) 0.4 MG SL TABLET    Place 0.4 mg under the tongue every 5 (five) minutes as needed for chest pain.   NON FORMULARY    Med Pass 120 mL by mouth two times daily   POLYETHYLENE GLYCOL (MIRALAX / GLYCOLAX) PACKET    Take 17 g by mouth daily.   PROPYLENE GLYCOL (SYSTANE BALANCE) 0.6 % SOLN    Place 1 drop into both eyes 2 (two) times daily.   SERTRALINE (ZOLOFT) 50 MG TABLET    Take 50 mg by mouth daily.   VITAMIN B-12 (CYANOCOBALAMIN) 1000 MCG TABLET    Take 1,000 mcg by mouth every other day.    VITAMIN C (ASCORBIC ACID) 500 MG TABLET    Take 500 mg by mouth daily.   VITAMIN D, ERGOCALCIFEROL, (DRISDOL) 50000 UNITS CAPS CAPSULE    Take 50,000 Units by mouth every 30 (thirty) days.  Modified Medications   No medications on file  Discontinued Medications   CALCIUM & MAGNESIUM CARBONATES (MYLANTA PO)    Take by mouth. Reported on 05/23/2016   LORAZEPAM (ATIVAN) 0.5 MG TABLET    Take one tablet by mouth twice daily for anxiety   METOPROLOL (LOPRESSOR) 100 MG TABLET    Take 1 tablet (100 mg total) by mouth 2 (two) times daily.   PSEUDOEPHEDRINE-DEXTROMETHORPHAN-GUAIFENESIN (ROBITUSSIN-PE) 30-10-100 MG/5ML SOLUTION    Take 10 mLs by mouth 2 (two) times daily as needed for cough. Reported on 05/23/2016   TRAZODONE (DESYREL) 50 MG TABLET    Take 50 mg by mouth at bedtime as needed for sleep. Reported on 05/23/2016    Review of Systems  Unable to perform ROS: Dementia    Filed Vitals:   05/23/16 1255  BP: 123/59  Pulse: 63  Temp: 98.3 F (36.8 C)  TempSrc: Oral  Resp: 22  Height:  (1.626  m)  Weight: 117 lb 3.2 oz (53.162 kg)   Body mass index is 20.11 kg/(m^2).  Physical Exam  Constitutional: She appears well-developed.  Sitting up in bed,  frail appearing in NAD  HENT:  Mouth/Throat: Oropharynx is clear and moist. No oropharyngeal exudate.  Eyes: Pupils are equal, round, and reactive to light. No scleral icterus.  Neck: Neck supple. Carotid bruit is not present. No tracheal deviation present.  Cardiovascular: Normal rate, regular rhythm and intact distal pulses.  Exam reveals no gallop and no friction rub.   Murmur (1/6 SEM) heard. Trace  RLE edema but no LLE edema. no calf TTP.   Pulmonary/Chest: Effort normal and breath sounds normal. No stridor. No respiratory distress. She has no wheezes. She has no rales.  Abdominal: Soft. Bowel sounds are normal. She exhibits no distension and no mass. There is no hepatomegaly. There is no tenderness. There is no rebound and no guarding.  Musculoskeletal: She exhibits edema.  Lymphadenopathy:    She has no cervical adenopathy.  Neurological: She is alert.  Skin: Skin is warm and dry. No rash noted.  Left anterior leg wound dsg intact  Psychiatric: Her behavior is normal. Her mood appears anxious.     Labs reviewed: Nursing Home on 05/23/2016  Component Date Value Ref Range Status  . Glucose  04/27/2016 92   Final  . BUN 04/27/2016 27* 4 - 21 mg/dL Final  . Creatinine 40/98/119105/05/2016 1.1  0.5 - 1.1 mg/dL Final  . Potassium 47/82/956205/05/2016 4.8  3.4 - 5.3 mmol/L Final  . Sodium 04/27/2016 140  137 - 147 mmol/L Final  Nursing Home on 03/25/2016  Component Date Value Ref Range Status  . HM Diabetic Eye Exam 07/31/2015 No Retinopathy  No Retinopathy Final  . Microalb, Ur 02/25/2016 0.6   Final  . Hemoglobin A1C 02/25/2016 6.5   Final  . Hemoglobin 01/29/2016 12.3  12.0 - 16.0 g/dL Final  . HCT 13/08/657802/05/2016 38  36 - 46 % Final  . Platelets 01/29/2016 295  150 - 399 K/L Final  . WBC 01/29/2016 6.8   Final  . Glucose 01/29/2016 104    Final  . BUN 01/29/2016 17  4 - 21 mg/dL Final  . Potassium 46/96/295202/05/2016 4.3  3.4 - 5.3 mmol/L Final  . Sodium 01/29/2016 140  137 - 147 mmol/L Final  . Triglycerides 01/29/2016 87  40 - 160 mg/dL Final  . Cholesterol 84/13/244002/05/2016 150  0 - 200 mg/dL Final  . HDL 10/27/253602/05/2016 41  35 - 70 mg/dL Final  . LDL Cholesterol 01/29/2016 92   Final    No results found.   Assessment/Plan   ICD-9-CM ICD-10-CM   1. Other fatigue probably related to #2 and #4 780.79 R53.83   2. Vascular dementia without behavioral disturbance 290.40 F01.50   3. Type 2 diabetes mellitus with stage 3 chronic kidney disease, without long-term current use of insulin (HCC) 250.40 E11.22    585.3 N18.3   4. Diastolic CHF, chronic (HCC) 428.32 I50.32    428.0    5. Essential hypertension 401.9 I10   6. Primary osteoarthritis involving multiple joints 715.09 M15.0   7. Hyperlipidemia with target LDL less than 70 272.4 E78.5   8. Coronary artery disease involving native coronary artery with other forms of angina pectoris (HCC)  I25.118   9. Anemia, iron deficiency 280.9 D50.9   10. PSVT (paroxysmal supraventricular tachycardia) (HCC) 427.0 I47.1     Continue current meds as ordered  T/c changing dementia meds to combo namzaric to reduce pill burden  PT/OT/ST as indicated  F/u with specialists as scheduled  [facility] NP to follow  Needs lipid panel, CMP and A1c next month  Will follow  Logann Whitebread S. Ancil Linseyarter, D. O., F. A. C. O. I.  [facility] [address] [place], [place] [address] [phone] Cell (Monday-Friday 8 AM - 5 PM) [phone] After 5 PM and follow prompts

## 2016-06-01 NOTE — Progress Notes (Signed)
Patient ID: [name], female   DOB: 09/07/1920, 82 y.o.   MRN: [id number]    DATE: 12/11/15  Location:  [facility]    Place of Service: SNF (31)   Extended Emergency Contact Information Primary Emergency Contact: Brown,[name] [address] [place], [place] Home Phone: [phone] Work Phone: [phone] Relation: Other  Advanced Directive information Does patient have an advance directive?: Yes (Patient has a DNR odrer in chart)  Chief Complaint  Patient presents with  . Medical Management of Chronic Issues; [facility]     HPI:  82 yo female long term resident seen today for f/u. She has no c/o today. No nursing issues. No falls. Appetite ok. Sleeping well. No falls. She is a poor historian due to dementia. Hx obtained from chart.   Hx CVA - stable on ASA daily  Dementia/anxiety - stable on namenda xr, melatonin, trazodone and lorazepam. Albumin 3.4. Weight stable  CAD/HTN/HF/hx psvt - stable on lasix, lopressor,prn NTG, cardizem  Hyperlipidemia - stable on lipitor. LDL 73  DM/CKD - diet controlled. Cr 0.8.    GERD/constipation - stable on miralax  She takes vitamins and minerals    Past Medical History  Diagnosis Date  . CVA (cerebral vascular accident) (HCC)     a. She's not sure that this is true.  [name] CAD (coronary artery disease)     a. reports prior h/o MI's with possible cath @ [facility] in [place] 'many years ago.'  . Vitamin D deficiency   . Diabetes mellitus, type 2 (HCC)   . HTN (hypertension)   . Tremor, essential   . Urinary incontinence   . High risk social situations 11/28/2011  . Intention tremor 03/03/2007    Qualifier: Diagnosis of  By: Dorathy Daft  MD, IDAYLIS    . Syncope, near 02/28/2012  . Altered mental status 01/01/2011    Qualifier: Diagnosis of  By: Earnest Bailey MD, Selena Batten    . DEFICIENCY, VITAMIN D NOS 07/16/2007    Qualifier: Diagnosis of  By: Irving Burton MD, Clifton Custard    . Asthma   . History of pneumonia     . Recurrent upper respiratory infection (URI)   . Hyperthyroidism   . Anemia   . CKD (chronic kidney disease), stage II   . H/O hiatal hernia   . Seizures (HCC)   . Headache(784.0)   . Neuromuscular disorder (HCC)   . Arthritis   . Fibromyalgia   . CAROTID ARTERY DISEASE 07/16/2007    a. 07/2012 U/S: no signif extracranial carotid artery stenosis, antegrad vertebral flow.  . Diastolic CHF, chronic (HCC)     a. 07/2013 Echo: EF 55-60%.  . TOBACCO USER   . Palpitations     a. 8.2014 Echo: EF 55-60%, no rwma, mod LVH, mildly dil LA, PASP .  [name] Systolic murmur     a. no significant valvular abnormalities on echo 8.2014.  [name] HLD (hyperlipidemia)   . Anxiety 03/09/2015    Past Surgical History  Procedure Laterality Date  . Appendectomy  1965  . Vaginal hysterectomy  1965  . Knee surgery  1989, 1995    Patient Care Team: Kirt Boys, DO as PCP - General (Internal Medicine)  Social History   Social History  . Marital Status: Widowed    Spouse Name: N/A  . Number of Children: N/A  . Years of Education: N/A   Occupational History  . Retired     from  [place] [employer]   Social History Main Topics  . Smoking status: Current Some Day Smoker -- 0.50 packs/day    Types: Cigarettes    Last Attempt to Quit: 04/06/2012  . Smokeless tobacco: Never Used     Comment: does not smoke much...  . Alcohol Use: No  . Drug Use: No  . Sexual Activity: No   Other Topics Concern  . Not on file   Social History Narrative   Lives at home alone. Widowed.  Husband was in the [employer].  Able to cook, bathe and cleans by herself. Walks with cane and crutch. Has a wheelchair but not able to use it.   Does not have anyone she can really trust. Has a friend that works at the [employer] that stops by to help some.   Lives off social security, retired from [employer] in 1988.   After mortgage, has $400 left.      Does not have a POA if anything should happen to her. Has 2  nieces, 2 nephews that live far away. (Only one nephew "knows that I am alive")      She does want CPR attempted but does not wish to have too much done. Organ donor/donating body to science.       Diet consists mostly of greens and veggies. Little meat, must force herself to eat meat.      Smokes 2-4 cigs per day.      reports that she has been smoking Cigarettes.  She has been smoking about 0.50 packs per day. She has never used smokeless tobacco. She reports that she does not drink alcohol or use illicit drugs.  Family History  Problem Relation Age of Onset  . Diabetes Mother   . Diabetes Brother   . Stroke Father    No family status information on file.    Immunization History  Administered Date(s) Administered  . Influenza-Unspecified 09/28/2014, 10/04/2015  . PPD Test 07/15/2014  . Pneumococcal Polysaccharide-23 09/22/1994  . Pneumococcal-Unspecified 08/05/2014  . Td 05/23/2004  . Tdap 02/25/2015    Allergies  Allergen Reactions  . Codeine Other (See Comments)    Pt states that it knocks her out.   [name] Penicillins Rash    Hives, upset stomach     Medications: Patient's Medications  New Prescriptions   No medications on file  Previous Medications   ACETAMINOPHEN (TYLENOL) 325 MG TABLET    Take 650 mg by mouth 3 (three) times daily.   ASPIRIN 81 MG TABLET    Take 81 mg by mouth daily.   DILTIAZEM (CARDIZEM) 90 MG TABLET    Take 90 mg by mouth 2 (two) times daily.   DONEPEZIL (ARICEPT) 10 MG TABLET    Take 10 mg by mouth at bedtime.   FERROUS SULFATE 325 (65 FE) MG TABLET    Take 325 mg by mouth daily with breakfast.   FUROSEMIDE (LASIX) 20 MG TABLET    Take 20 mg by mouth every other day.   LORAZEPAM (ATIVAN) 0.5 MG TABLET    Take 0.5 mg by mouth 4 (four) times daily.   MELATONIN 3 MG CAPS    Take 3 mg by mouth at bedtime.   MEMANTINE (NAMENDA XR) 28 MG CP24 24 HR CAPSULE    Take 28 mg by mouth daily.   METOPROLOL (LOPRESSOR) 50 MG TABLET    Take 1 1/2 tablets  (75mg ) by mouth twice a day. Hold for HR<60 and notify [facility]  NITROGLYCERIN (NITROSTAT) 0.4 MG SL TABLET    Place 0.4 mg under the tongue every 5 (five) minutes as needed for chest pain.   NON FORMULARY    Med Pass 120 mL by mouth two times daily   POLYETHYLENE GLYCOL (MIRALAX / GLYCOLAX) PACKET    Take 17 g by mouth daily.   PROPYLENE GLYCOL (SYSTANE BALANCE) 0.6 % SOLN    Place 1 drop into both eyes 2 (two) times daily.   SERTRALINE (ZOLOFT) 50 MG TABLET    Take 50 mg by mouth daily.   VITAMIN B-12 (CYANOCOBALAMIN) 1000 MCG TABLET    Take 1,000 mcg by mouth every other day.    VITAMIN C (ASCORBIC ACID) 500 MG TABLET    Take 500 mg by mouth daily.   VITAMIN D, ERGOCALCIFEROL, (DRISDOL) 50000 UNITS CAPS CAPSULE    Take 50,000 Units by mouth every 30 (thirty) days.  Modified Medications   No medications on file  Discontinued Medications   ATORVASTATIN (LIPITOR) 10 MG TABLET    Take 10 mg by mouth daily.    CALCIUM & MAGNESIUM CARBONATES (MYLANTA PO)    Take by mouth. Reported on 05/23/2016   CALCIUM CARBONATE (OS-CAL) 600 MG TABS TABLET    Take 600 mg by mouth daily with breakfast.    FERROUS SULFATE 325 (65 FE) MG TABLET    Take 325 mg by mouth daily with breakfast.   LORAZEPAM (ATIVAN) 0.5 MG TABLET    Take one tablet by mouth twice daily for anxiety   METOPROLOL (LOPRESSOR) 100 MG TABLET    Take 1 tablet (100 mg total) by mouth 2 (two) times daily.   PSEUDOEPHEDRINE-DEXTROMETHORPHAN-GUAIFENESIN (ROBITUSSIN-PE) 30-10-100 MG/5ML SOLUTION    Take 10 mLs by mouth 2 (two) times daily as needed for cough. Reported on 05/23/2016   SERTRALINE (ZOLOFT) 25 MG TABLET    Take 37.5 mg by mouth daily.   TRAZODONE (DESYREL) 50 MG TABLET    Take 50 mg by mouth at bedtime as needed for sleep. Reported on 05/23/2016    Review of Systems  Unable to perform ROS: Dementia    Filed Vitals:   12/11/15 1456  BP: 116/96  Pulse: 58  Temp: 98.2 F (36.8 C)  TempSrc: Oral  Resp: 18  Weight: 122 lb 6.4 oz (55.52  kg)   Body mass index is 21 kg/(m^2).  Physical Exam  Constitutional: She appears well-developed.  Sitting in w/c in NAD, frail appearing  HENT:  Mouth/Throat: Oropharynx is clear and moist. No oropharyngeal exudate.  Eyes: Pupils are equal, round, and reactive to light. No scleral icterus.  Neck: Neck supple. Carotid bruit is present (systolic from chest). No tracheal deviation present. No thyromegaly present.  Cardiovascular: Regular rhythm and intact distal pulses.  Bradycardia present.  Exam reveals no gallop and no friction rub.   Murmur (radiates to carotid b/l) heard.  Systolic murmur is present with a grade of 2/6  No LE edema b/l. no calf TTP.   Pulmonary/Chest: Effort normal and breath sounds normal. No stridor. No respiratory distress. She has no wheezes. She has no rales.  Abdominal: Soft. Bowel sounds are normal. She exhibits no distension and no mass. There is no hepatomegaly. There is no tenderness. There is no rebound and no guarding.  Musculoskeletal: She exhibits edema.  Lymphadenopathy:    She has no cervical adenopathy.  Neurological: She is alert.  Skin: Skin is warm and dry. No rash noted.  Psychiatric: She has a normal mood and affect.  Her behavior is normal. Thought content is delusional.     Labs reviewed: Nursing Home on 12/11/2015  Component Date Value Ref Range Status  . Hemoglobin 09/26/2015 12.7  12.0 - 16.0 g/dL Final  . HCT 10/96/0454 39  36 - 46 % Final  . Platelets 09/26/2015 263  150 - 399 K/L Final  . WBC 09/26/2015 7.6   Final  . Glucose 09/26/2015 106   Final  . BUN 09/26/2015 20  4 - 21 mg/dL Final  . Creatinine 09/81/1914 0.8  .5 - 1.1 mg/dL Final  . Sodium 78/29/5621 141  137 - 147 mmol/L Final  . ALT 09/26/2015 17  7 - 35 U/L Final  . AST 09/26/2015 15  13 - 35 U/L Final  . Bilirubin, Total 09/26/2015 0.3   Final  . Hemoglobin A1C 09/26/2015 6.6   Final  Nursing Home on 11/13/2015  Component Date Value Ref Range Status  .  Triglycerides 09/26/2015 43  40 - 160 mg/dL Final  . Cholesterol 30/86/5784 128  0 - 200 mg/dL Final  . HDL 69/62/9528 46  35 - 70 mg/dL Final  . LDL Cholesterol 09/26/2015 73   Final  Admission on 09/25/2015, Discharged on 09/25/2015  Component Date Value Ref Range Status  . Sodium 09/25/2015 143  135 - 145 mmol/L Final  . Potassium 09/25/2015 4.2  3.5 - 5.1 mmol/L Final  . Chloride 09/25/2015 104  101 - 111 mmol/L Final  . BUN 09/25/2015 29* 6 - 20 mg/dL Final  . Creatinine, Ser 09/25/2015 0.90  0.44 - 1.00 mg/dL Final  . Glucose, Bld 41/32/4401 87  65 - 99 mg/dL Final  . Calcium, Ion 02/72/5366 1.21  1.13 - 1.30 mmol/L Final  . TCO2 09/25/2015 26  0 - 100 mmol/L Final  . Hemoglobin 09/25/2015 15.3* 12.0 - 15.0 g/dL Final  . HCT 44/02/4741 45.0  36.0 - 46.0 % Final  . WBC 09/25/2015 7.7  4.0 - 10.5 K/uL Final  . RBC 09/25/2015 4.65  3.87 - 5.11 MIL/uL Final  . Hemoglobin 09/25/2015 13.2  12.0 - 15.0 g/dL Final  . HCT 59/56/3875 39.8  36.0 - 46.0 % Final  . MCV 09/25/2015 85.6  78.0 - 100.0 fL Final  . MCH 09/25/2015 28.4  26.0 - 34.0 pg Final  . MCHC 09/25/2015 33.2  30.0 - 36.0 g/dL Final  . RDW 64/33/2951 15.0  11.5 - 15.5 % Final  . Platelets 09/25/2015 286  150 - 400 K/uL Final  . Neutrophils Relative % 09/25/2015 76   Final  . Neutro Abs 09/25/2015 5.9  1.7 - 7.7 K/uL Final  . Lymphocytes Relative 09/25/2015 10   Final  . Lymphs Abs 09/25/2015 0.8  0.7 - 4.0 K/uL Final  . Monocytes Relative 09/25/2015 11   Final  . Monocytes Absolute 09/25/2015 0.9  0.1 - 1.0 K/uL Final  . Eosinophils Relative 09/25/2015 2   Final  . Eosinophils Absolute 09/25/2015 0.1  0.0 - 0.7 K/uL Final  . Basophils Relative 09/25/2015 1   Final  . Basophils Absolute 09/25/2015 0.1  0.0 - 0.1 K/uL Final  . Troponin I 09/25/2015 <0.03  <0.031 ng/mL Final   Comment:        NO INDICATION OF MYOCARDIAL INJURY.   . Color, Urine 09/25/2015 YELLOW  YELLOW Final  . APPearance 09/25/2015 CLEAR  CLEAR Final    . Specific Gravity, Urine 09/25/2015 1.023  1.005 - 1.030 Final  . pH 09/25/2015 5.5  5.0 - 8.0 Final  .  Glucose, UA 09/25/2015 NEGATIVE  NEGATIVE mg/dL Final  . Hgb urine dipstick 09/25/2015 SMALL* NEGATIVE Final  . Bilirubin Urine 09/25/2015 NEGATIVE  NEGATIVE Final  . Ketones, ur 09/25/2015 NEGATIVE  NEGATIVE mg/dL Final  . Protein, ur 16/10/960410/02/2015 NEGATIVE  NEGATIVE mg/dL Final  . Urobilinogen, UA 09/25/2015 0.2  0.0 - 1.0 mg/dL Final  . Nitrite 54/09/811910/02/2015 NEGATIVE  NEGATIVE Final  . Leukocytes, UA 09/25/2015 NEGATIVE  NEGATIVE Final  . Squamous Epithelial / LPF 09/25/2015 RARE  RARE Final  . RBC / HPF 09/25/2015 0-2  <3 RBC/hpf Final  . Bacteria, UA 09/25/2015 RARE  RARE Final    No results found.   Assessment/Plan   ICD-9-CM ICD-10-CM   1. Vascular dementia without behavioral disturbance 290.40 F01.50   2. Type 2 diabetes mellitus with stage 3 chronic kidney disease, without long-term current use of insulin (HCC) 250.40 E11.22    585.3 N18.3   3. Primary osteoarthritis involving multiple joints 715.09 M15.0   4. Diastolic CHF, chronic (HCC) 428.32 I50.32    428.0    5. Anxiety 300.00 F41.9   6. PSVT (paroxysmal supraventricular tachycardia) (HCC) 427.0 I47.1   7. Essential hypertension 401.9 I10   8. Coronary artery disease involving native coronary artery with other forms of angina pectoris (HCC)  I25.118   9. Hyperlipidemia with target LDL less than 70 272.4 E78.5     [facility] NP to follow  Pt is medically stable on current tx plan. Continue current medications as ordered. PT/OT/ST as indicated. Will follow  Genevra Orne S. Ancil Linseyarter, D. O., F. A. C. O. I.  [facility] [address] [place], [place] [address] [phone] Cell (Monday-Friday 8 AM - 5 PM) [phone] After 5 PM and follow prompts

## 2016-07-04 ENCOUNTER — Encounter: Payer: Self-pay | Admitting: Internal Medicine

## 2016-07-04 ENCOUNTER — Non-Acute Institutional Stay (SKILLED_NURSING_FACILITY): Payer: Medicare Other | Admitting: Internal Medicine

## 2016-07-04 DIAGNOSIS — M159 Polyosteoarthritis, unspecified: Secondary | ICD-10-CM

## 2016-07-04 DIAGNOSIS — E1122 Type 2 diabetes mellitus with diabetic chronic kidney disease: Secondary | ICD-10-CM | POA: Diagnosis not present

## 2016-07-04 DIAGNOSIS — D509 Iron deficiency anemia, unspecified: Secondary | ICD-10-CM | POA: Diagnosis not present

## 2016-07-04 DIAGNOSIS — M150 Primary generalized (osteo)arthritis: Secondary | ICD-10-CM

## 2016-07-04 DIAGNOSIS — N183 Chronic kidney disease, stage 3 (moderate): Secondary | ICD-10-CM | POA: Diagnosis not present

## 2016-07-04 DIAGNOSIS — I25118 Atherosclerotic heart disease of native coronary artery with other forms of angina pectoris: Secondary | ICD-10-CM | POA: Diagnosis not present

## 2016-07-04 DIAGNOSIS — F0150 Vascular dementia without behavioral disturbance: Secondary | ICD-10-CM | POA: Diagnosis not present

## 2016-07-04 DIAGNOSIS — G4700 Insomnia, unspecified: Secondary | ICD-10-CM | POA: Diagnosis not present

## 2016-07-04 DIAGNOSIS — I5032 Chronic diastolic (congestive) heart failure: Secondary | ICD-10-CM | POA: Diagnosis not present

## 2016-07-04 DIAGNOSIS — I10 Essential (primary) hypertension: Secondary | ICD-10-CM | POA: Diagnosis not present

## 2016-07-04 DIAGNOSIS — F419 Anxiety disorder, unspecified: Secondary | ICD-10-CM

## 2016-07-04 DIAGNOSIS — E785 Hyperlipidemia, unspecified: Secondary | ICD-10-CM

## 2016-07-04 NOTE — Progress Notes (Signed)
This encounter was created in error - please disregard.

## 2016-07-04 NOTE — Progress Notes (Signed)
Patient ID: [name], female   DOB: [birth year], 82 y.o.   MRN: [id number]    DATE: 07/04/16  Location:  Nursing Home Location: [facility]  Nursing Home Room Number: 114 B Place of Service: SNF (31)   Extended Emergency Contact Information Primary Emergency Contact: Brown,[name] [address] [place], [place] Home Phone: [phone] Work Phone: [phone] Relation: Other  Advanced Directive information Does patient have an advance directive?: Yes, Type of Advance Directive: Out of facility DNR (pink MOST or yellow form), Does patient want to make changes to advanced directive?: No - Patient declined  Chief Complaint  Patient presents with  . Medical Management of Chronic Issues    Routine Visit/ [facility]    HPI:  82 yo female long term resident seen today for f/u. She c/o decreased hearing and left lateral leg pain. No known trauma. No nursing issues. No falls. Appetite ok. No constipation/diarrhea. Sleeping poorly per pt. She is a poor historian due to dementia. Hx obtained from chart  CHF/PSVT/CAD - rate controlled on cardizem and lopressor. Takes ASA daily. No CP but has prn SLNTG if need it. Edema stable on lasix  Hyperlipidemia - diet controlled. LDL 92  PAD - stable. Takes ASA daily  CKD - stable. Cr 1.1  OA - pain stable on tylenol. She has multiple joint swelling  DM - diet controlled. A1c 6.5%. Urine microalbumin 0.6  Dementia/anxiety/insomnia - dementia stable on namenda xr and  aricept. Mood stable on sertraline and lorazepam. Takes melatonin at night to help sleep. No longer on trazodone She takes several supplements  Fe deficiency anemia - stable on iron and B12. Hgb 12.3  Constipation - stable on miralax  Past Medical History  Diagnosis Date  . CVA (cerebral vascular accident) (HCC)     a. She's not sure that this is true.  [name] CAD (coronary artery disease)     a. reports prior h/o MI's with possible cath @ [facility]  in [place] 'many years ago.'  . Vitamin D deficiency   . Diabetes mellitus, type 2 (HCC)   . HTN (hypertension)   . Tremor, essential   . Urinary incontinence   . High risk social situations 11/28/2011  . Intention tremor 03/03/2007    Qualifier: Diagnosis of  By: Dorathy Daft  MD, IDAYLIS    . Syncope, near 02/28/2012  . Altered mental status 01/01/2011    Qualifier: Diagnosis of  By: Earnest Bailey MD, Selena Batten    . DEFICIENCY, VITAMIN D NOS 07/16/2007    Qualifier: Diagnosis of  By: Irving Burton MD, Clifton Custard    . Asthma   . History of pneumonia   . Recurrent upper respiratory infection (URI)   . Hyperthyroidism   . Anemia   . CKD (chronic kidney disease), stage II   . H/O hiatal hernia   . Seizures (HCC)   . Headache(784.0)   . Neuromuscular disorder (HCC)   . Arthritis   . Fibromyalgia   . CAROTID ARTERY DISEASE 07/16/2007    a. 07/2012 U/S: no signif extracranial carotid artery stenosis, antegrad vertebral flow.  . Diastolic CHF, chronic (HCC)     a. 07/2013 Echo: EF 55-60%.  . TOBACCO USER   . Palpitations     a. 8.2014 Echo: EF 55-60%, no rwma, mod LVH, mildly dil LA, PASP .  [name] Systolic murmur     a. no significant valvular abnormalities on echo 8.2014.  [name] HLD (hyperlipidemia)   .  Anxiety 03/09/2015    Past Surgical History  Procedure Laterality Date  . Appendectomy  1965  . Vaginal hysterectomy  1965  . Knee surgery  1989, 1995    Patient Care Team: Kirt Boys, DO as PCP - General (Internal Medicine)  Social History   Social History  . Marital Status: Widowed    Spouse Name: N/A  . Number of Children: N/A  . Years of Education: N/A   Occupational History  . Retired     from [place] [employer]   Social History Main Topics  . Smoking status: Current Some Day Smoker -- 0.50 packs/day    Types: Cigarettes    Last Attempt to Quit: 04/06/2012  . Smokeless tobacco: Never Used     Comment: does not smoke much...  . Alcohol Use: No  . Drug Use: No  . Sexual Activity: No    Other Topics Concern  . Not on file   Social History Narrative   Lives at home alone. Widowed.  Husband was in the [employer].  Able to cook, bathe and cleans by herself. Walks with cane and crutch. Has a wheelchair but not able to use it.   Does not have anyone she can really trust. Has a friend that works at the [employer] that stops by to help some.   Lives off social security, retired from [employer] in 1988.   After mortgage, has $400 left.      Does not have a POA if anything should happen to her. Has 2 nieces, 2 nephews that live far away. (Only one nephew "knows that I am alive")      She does want CPR attempted but does not wish to have too much done. Organ donor/donating body to science.       Diet consists mostly of greens and veggies. Little meat, must force herself to eat meat.      Smokes 2-4 cigs per day.      reports that she has been smoking Cigarettes.  She has been smoking about 0.50 packs per day. She has never used smokeless tobacco. She reports that she does not drink alcohol or use illicit drugs.  Family History  Problem Relation Age of Onset  . Diabetes Mother   . Diabetes Brother   . Stroke Father    No family status information on file.    Immunization History  Administered Date(s) Administered  . Influenza-Unspecified 09/28/2014, 10/04/2015  . PPD Test 07/15/2014  . Pneumococcal Polysaccharide-23 09/22/1994  . Pneumococcal-Unspecified 08/05/2014  . Td 05/23/2004  . Tdap 02/25/2015    Allergies  Allergen Reactions  . Codeine Other (See Comments)    Pt states that it knocks her out.   [name] Penicillins Rash    Hives, upset stomach     Medications: Patient's Medications  New Prescriptions   No medications on file  Previous Medications   ACETAMINOPHEN (TYLENOL) 325 MG TABLET    Take 650 mg by mouth 3 (three) times daily.   ASPIRIN 81 MG TABLET    Take 81 mg by mouth daily.   DILTIAZEM (CARDIZEM) 90 MG TABLET    Take 90  mg by mouth 2 (two) times daily.   DONEPEZIL (ARICEPT) 10 MG TABLET    Take 10 mg by mouth at bedtime.   FERROUS SULFATE 325 (65 FE) MG TABLET    Take 325 mg by mouth daily with breakfast.   FUROSEMIDE (LASIX) 20 MG TABLET    Take 20 mg by  mouth every other day.   LORAZEPAM (ATIVAN) 0.5 MG TABLET    Take 0.5 mg by mouth 4 (four) times daily.   MELATONIN 3 MG CAPS    Take 3 mg by mouth at bedtime.   MEMANTINE (NAMENDA XR) 28 MG CP24 24 HR CAPSULE    Take 28 mg by mouth daily.   MENTHOL, TOPICAL ANALGESIC, (BIOFREEZE EX)    Apply topically. Apply topically to knees and shoulders three times daily for pain   METOPROLOL (LOPRESSOR) 50 MG TABLET    Take 1 1/2 tablets ( ) by mouth twice a day. Hold for HR<60 and notify [facility]   NITROGLYCERIN (NITROSTAT) 0.4 MG SL TABLET    Place 0.4 mg under the tongue every 5 (five) minutes as needed for chest pain.   NON FORMULARY    Med Pass 120 mL by mouth two times daily   POLYETHYLENE GLYCOL (MIRALAX / GLYCOLAX) PACKET    Take 17 g by mouth daily.   PROPYLENE GLYCOL (SYSTANE BALANCE) 0.6 % SOLN    Place 1 drop into both eyes 2 (two) times daily.   SERTRALINE (ZOLOFT) 50 MG TABLET    Take 50 mg by mouth daily.   VITAMIN B-12 (CYANOCOBALAMIN) 1000 MCG TABLET    Take 1,000 mcg by mouth every other day.    VITAMIN C (ASCORBIC ACID) 500 MG TABLET    Take 500 mg by mouth daily.   VITAMIN D, ERGOCALCIFEROL, (DRISDOL) 50000 UNITS CAPS CAPSULE    Take 50,000 Units by mouth every 30 (thirty) days.  Modified Medications   No medications on file  Discontinued Medications   No medications on file    Review of Systems  Unable to perform ROS: Dementia    Filed Vitals:   07/04/16 1207  BP: 126/76  Pulse: 70  Temp: 97.2 F (36.2 C)  TempSrc: Oral  Resp: 22  Height:  (1.626 m)  Weight: 113 lb (51.256 kg)   Body mass index is 19.39 kg/(m^2).  Physical Exam  Constitutional: She appears well-developed.  Sitting up in bed,  frail appearing in NAD  HENT:   Mouth/Throat: Oropharynx is clear and moist. No oropharyngeal exudate.  MMM  Eyes: Pupils are equal, round, and reactive to light. No scleral icterus.  Neck: Neck supple. Carotid bruit is not present. No tracheal deviation present.  Cardiovascular: Normal rate, regular rhythm and intact distal pulses.  Exam reveals no gallop and no friction rub.   Murmur (1/6 SEM) heard. Trace  RLE edema but no LLE edema. no calf TTP.   Pulmonary/Chest: Effort normal and breath sounds normal. No stridor. No respiratory distress. She has no wheezes. She has no rales.  Abdominal: Soft. Bowel sounds are normal. She exhibits no distension and no mass. There is no hepatomegaly. There is no tenderness. There is no rebound and no guarding.  Musculoskeletal: She exhibits edema.  Lymphadenopathy:    She has no cervical adenopathy.  Neurological: She is alert.  Skin: Skin is warm and dry. No rash noted.  Lateral left leg TTP contusion  Psychiatric: Her behavior is normal. Her mood appears anxious.     Labs reviewed: Nursing Home on 05/23/2016  Component Date Value Ref Range Status  . Glucose 04/27/2016 92   Final  . BUN 04/27/2016 27* 4 - 21 mg/dL Final  . Creatinine 19/14/7829 1.1  0.5 - 1.1 mg/dL Final  . Potassium 56/21/3086 4.8  3.4 - 5.3 mmol/L Final  . Sodium 04/27/2016 140  137 - 147  mmol/L Final    No results found.   Assessment/Plan   ICD-9-CM ICD-10-CM   1. Vascular dementia without behavioral disturbance 290.40 F01.50   2. Primary osteoarthritis involving multiple joints 715.09 M15.0   3. Essential hypertension 401.9 I10   4. Diastolic CHF, chronic (HCC) 428.32 I50.32    428.0    5. Hyperlipidemia with target LDL less than 70 272.4 E78.5   6. Type 2 diabetes mellitus with stage 3 chronic kidney disease, without long-term current use of insulin (HCC) 250.40 E11.22    585.3 N18.3   7. Anemia, iron deficiency 280.9 D50.9   8. Anxiety 300.00 F41.9   9. Coronary artery disease involving  native coronary artery with other forms of angina pectoris (HCC)  I25.118   10. Insomnia 780.52 G47.00     Cont current meds as ordered  Check CBC, lipid panel and A1c  PT/OT/ST as indicated  T/c audiology referral for hearing loss  [facility] NP to follow  Will follow  Zayley Arras S. Ancil Linseyarter, D. O., F. A. C. O. I.  [facility] [address] [place], [place] [address] [phone] Cell (Monday-Friday 8 AM - 5 PM) [phone] After 5 PM and follow prompts

## 2016-07-05 LAB — CBC AND DIFFERENTIAL
HCT: 37 % (ref 36–46)
Hemoglobin: 11.8 g/dL — AB (ref 12.0–16.0)
Platelets: 279 10*3/uL (ref 150–399)
WBC: 6.8 10*3/mL

## 2016-07-05 LAB — HEMOGLOBIN A1C: HEMOGLOBIN A1C: 6.4

## 2016-07-10 LAB — LIPID PANEL
CHOLESTEROL: 170 mg/dL (ref 0–200)
HDL: 52 mg/dL (ref 35–70)
LDL Cholesterol: 106 mg/dL
TRIGLYCERIDES: 61 mg/dL (ref 40–160)

## 2016-07-10 LAB — HEMOGLOBIN A1C: Hemoglobin A1C: 6.5

## 2016-07-10 LAB — CBC AND DIFFERENTIAL
HCT: 34 % — AB (ref 36–46)
Hemoglobin: 11 g/dL — AB (ref 12.0–16.0)
PLATELETS: 263 10*3/uL (ref 150–399)
WBC: 6.3 10*3/mL

## 2016-08-27 ENCOUNTER — Non-Acute Institutional Stay (SKILLED_NURSING_FACILITY): Payer: Medicare Other | Admitting: Internal Medicine

## 2016-08-27 ENCOUNTER — Encounter: Payer: Self-pay | Admitting: Internal Medicine

## 2016-08-27 DIAGNOSIS — M810 Age-related osteoporosis without current pathological fracture: Secondary | ICD-10-CM | POA: Diagnosis not present

## 2016-08-27 DIAGNOSIS — I10 Essential (primary) hypertension: Secondary | ICD-10-CM | POA: Diagnosis not present

## 2016-08-27 DIAGNOSIS — D509 Iron deficiency anemia, unspecified: Secondary | ICD-10-CM

## 2016-08-27 DIAGNOSIS — F0150 Vascular dementia without behavioral disturbance: Secondary | ICD-10-CM

## 2016-08-27 DIAGNOSIS — E1159 Type 2 diabetes mellitus with other circulatory complications: Secondary | ICD-10-CM

## 2016-08-27 NOTE — Assessment & Plan Note (Signed)
No change in dual therapy with Namenda and Aricept as she is clinically stable

## 2016-08-27 NOTE — Assessment & Plan Note (Signed)
She is on high-dose monthly vitamin D3, level will be checked

## 2016-08-27 NOTE — Patient Instructions (Addendum)
Lab assessment of present therapies with ferritin level, vitamin D3 25-hydroxy level, and B12 level

## 2016-08-27 NOTE — Progress Notes (Signed)
    Facility Location: [facility]  Room Number: 114-B  Code Status: DNR  This is a nursing facility follow up of chronic medical diagnoses  Interim medical record and care since last [facility] visit was updated with review of diagnostic studies and change in clinical status since last visit were documented.  HPI: This sweet  lady is pleasantly demented. She is unable to give any valid history of any active symptoms. She is unable to give the month or year. She stated it is August 2003. Remote memory varied to some extent. 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 She also stated she worked in the[employer] and traveled extensively throughout [place] in the 1960s. She stated that she spoke 4 languages : [place], [place], [place], and [place]. She is unable to remember any linguistic phrases. She is on dual therapy with Aricept and Namenda. Apparently she is anxious at times and has standing orders for lorazepam. She is on 50,000 international units of vitamin D monthly. She's also on vitamin C, iron and B12. Her most recent labs were 07/10/16. LDL was 106. She exhibited minimal anemia with a hemoglobin of 11. A1c was borderline diabetes at 6.5%.  Review of systems: Dementia invalidated responses as noted above. She did describe some difficulty sleeping.  Physical exam:  Pertinent or positive findings: She's  very frail and sits in a wheelchair wearing a sweater despite the warm environment. She has alternating resting exotropia. She has complete dentures. She has a brisk grade 1 systolic murmur. Breath sounds are decreased overall but she does have scattered rhonchi without increased work of breathing. Pedal pulses are decreased. She has interosseous wasting of the hands. Clubbing of the nails are present  General appearance:Thin but adequately nourished; no acute distress  is present.   Lymphatic: No lymphadenopathy  about the head, neck, axilla . Eyes: No conjunctival inflammation or lid edema is present. There is no scleral icterus. Ears:  External ear exam shows no significant lesions or deformities.   Nose:  External nasal examination shows no deformity or inflammation. Nasal mucosa are pink and moist without lesions ,exudates Oral exam: lips and gums are healthy appearing.There is no oropharyngeal erythema or exudate . Neck:  No thyromegaly, masses, tenderness noted.    Heart:  Normal rate and regular rhythm. S1 and S2 normal without gallop, click, rub .  Abdomen:Bowel sounds are normal. Abdomen is soft and nontender with no organomegaly, hernias,masses. GU: deferred  Extremities:  No cyanosis, edema  Neurologic exam : Strength equal  in upper & lower extremities but decreased Balance,Rhomberg,finger to nose testing could not be completed due to clinical state Deep tendon reflexes are equal Skin: Warm & dry w/o tenting. No significant lesions or rash.    See summary under each active problem in the Problem List with associated updated therapeutic plan

## 2016-08-27 NOTE — Assessment & Plan Note (Signed)
Check ferritin to assess need for iron

## 2016-08-27 NOTE — Assessment & Plan Note (Signed)
Adequate blood pressure control on present regimen, no change indicated

## 2016-08-27 NOTE — Assessment & Plan Note (Signed)
She is prediabetic or borderline diabetic with an A1c of 6.5%. No intervention is needed as hypoglycemia must be avoided at all costs as it will make the mental status changes worse

## 2016-10-09 LAB — HM DIABETES EYE EXAM

## 2016-11-05 ENCOUNTER — Non-Acute Institutional Stay (SKILLED_NURSING_FACILITY): Payer: Medicare Other | Admitting: Internal Medicine

## 2016-11-05 ENCOUNTER — Encounter: Payer: Self-pay | Admitting: Internal Medicine

## 2016-11-05 DIAGNOSIS — F0150 Vascular dementia without behavioral disturbance: Secondary | ICD-10-CM

## 2016-11-05 DIAGNOSIS — R7303 Prediabetes: Secondary | ICD-10-CM

## 2016-11-05 DIAGNOSIS — I10 Essential (primary) hypertension: Secondary | ICD-10-CM | POA: Diagnosis not present

## 2016-11-05 DIAGNOSIS — I2510 Atherosclerotic heart disease of native coronary artery without angina pectoris: Secondary | ICD-10-CM | POA: Diagnosis not present

## 2016-11-05 NOTE — Patient Instructions (Signed)
See Current Assessment & Plan in Problem List under specific Diagnosis 

## 2016-11-05 NOTE — Assessment & Plan Note (Signed)
Trial of lower dose lorazepam

## 2016-11-05 NOTE — Assessment & Plan Note (Addendum)
Highest A1c to date is 6.5%; no medication indicated

## 2016-11-05 NOTE — Assessment & Plan Note (Signed)
Asymptomatic; no prn  [employer] employed.

## 2016-11-05 NOTE — Assessment & Plan Note (Signed)
BP controlled; no change in antihypertensive medications  

## 2016-11-05 NOTE — Progress Notes (Signed)
   Facility Location: [facility]  Room Number: 114-B  Code Status: DNR   This is a nursing facility follow up for chronic medical diagnoses  Interim medical record and care [facility] was updated with review of diagnostic studies and change in clinical status since last visit were documented.  HPI: The patient is wheelchair-bound and self propels. She remains pleasantly confused.She has PMH of CAD & MI; she does not voice any active symptoms. She has not employed nitroglycerin She remains on rate controlling agents metoprolol as well as diltiazem. She has had a mild anemia with hemoglobins ranging from 11-12 0.3. She is on ferrous sulfate 325 mg and one thousand micrograms of B12 daily. A1c has been in the prediabetic-borderline diabetes range of 6.4-6.5 % without medication. Kidney function has been normal, this was last checked 04/27/16. She remains on Aricept and Namenda for her dementia. Lorazepam 0.5 mg is administered on a regular basis 4 times a day.  Review of systems: Dementia invalidated responses. She was unable to give the date or the name of the president. She does not even know where she is. Her remote memory about having been [place] is extremely good however She has denied any active cardiopulmonary symptoms.   Physical exam:  Pertinent or positive findings she appears much younger then her stated age.Resting exotropia on the left. Heart was heard best in the epigastrium. Pedal pulses are palpable although slightly reduced. She has interosseous wasting of the hands   General appearance:Adequately nourished; no acute distress , increased work of breathing is present.   Lymphatic: No lymphadenopathy about the head, neck, axilla . Eyes: No conjunctival inflammation or lid edema is present. There is no scleral icterus. Ears:  External ear exam shows no significant lesions or deformities.   Nose:  External nasal  examination shows no deformity or inflammation. Nasal mucosa are pink and moist without lesions ,exudates Oral exam: lips and gums are healthy appearing.There is no oropharyngeal erythema or exudate . Neck:  No thyromegaly, masses, tenderness noted.    Heart:  Normal rate and regular rhythm. S1 and S2 normal without gallop, murmur, click, rub .  Lungs:Chest clear to auscultation without wheezes, rhonchi,rales , rubs. Abdomen:Bowel sounds are normal. Abdomen is soft and nontender with no organomegaly, hernias,masses. GU: deferred  Extremities:  No cyanosis, clubbing,edema  Neurologic exam : Strength equal  in upper & lower extremities but decreased Balance,Rhomberg,finger to nose testing could not be completed due to clinical state Skin: Warm & dry w/o tenting. No significant lesions or rash.    See summary under each active problem in the Problem List with associated updated therapeutic plan

## 2017-01-11 LAB — CBC AND DIFFERENTIAL
HCT: 40 % (ref 36–46)
Hemoglobin: 12.4 g/dL (ref 12.0–16.0)
Platelets: 311 10*3/uL (ref 150–399)
WBC: 7 10^3/mL

## 2017-01-11 LAB — BASIC METABOLIC PANEL
BUN: 21 mg/dL (ref 4–21)
CREATININE: 1.1 mg/dL (ref 0.5–1.1)
Glucose: 161 mg/dL
Potassium: 4.7 mmol/L (ref 3.4–5.3)
SODIUM: 143 mmol/L (ref 137–147)

## 2017-01-17 ENCOUNTER — Other Ambulatory Visit: Payer: Self-pay

## 2017-01-17 MED ORDER — LORAZEPAM 0.5 MG PO TABS: ORAL_TABLET | ORAL | 0 refills | Status: DC

## 2017-01-17 NOTE — Telephone Encounter (Signed)
Prescription request was received from:    [facility] [address] [place] [address]  Phone: [phone] Fax: [phone] 

## 2017-01-21 LAB — BASIC METABOLIC PANEL
BUN: 21 mg/dL (ref 4–21)
CREATININE: 1.1 mg/dL (ref 0.5–1.1)
GLUCOSE: 161 mg/dL
Potassium: 4.7 mmol/L (ref 3.4–5.3)
Sodium: 143 mmol/L (ref 137–147)

## 2017-01-23 ENCOUNTER — Encounter: Payer: Self-pay | Admitting: Internal Medicine

## 2017-01-23 DIAGNOSIS — Z1612 Extended spectrum beta lactamase (ESBL) resistance: Secondary | ICD-10-CM

## 2017-01-23 DIAGNOSIS — B9629 Other Escherichia coli [E. coli] as the cause of diseases classified elsewhere: Secondary | ICD-10-CM | POA: Insufficient documentation

## 2017-01-23 DIAGNOSIS — N390 Urinary tract infection, site not specified: Secondary | ICD-10-CM

## 2017-03-11 ENCOUNTER — Encounter: Payer: Self-pay | Admitting: Internal Medicine

## 2017-03-11 ENCOUNTER — Non-Acute Institutional Stay (SKILLED_NURSING_FACILITY): Payer: Medicare Other | Admitting: Internal Medicine

## 2017-03-11 DIAGNOSIS — R7303 Prediabetes: Secondary | ICD-10-CM

## 2017-03-11 DIAGNOSIS — J410 Simple chronic bronchitis: Secondary | ICD-10-CM

## 2017-03-11 DIAGNOSIS — J42 Unspecified chronic bronchitis: Secondary | ICD-10-CM | POA: Insufficient documentation

## 2017-03-11 NOTE — Progress Notes (Signed)
Facility Location: [facility]  Room Number:114B  Code Status: DNR  This is a nursing facility follow up of chronic medical diagnoses  Interim medical record and care [facility] was updated with review of diagnostic studies and change in clinical status since last visit were documented.  HPI: The patient was last seen by [facility] 01/20/17, the patient does have intermittent "spells" where she stays in bed without specific complaint. On 1/29 she did have dizziness with a blood pressure 92/52 & heart rate of 52 .The symptoms were in the context of flu the week before for which she received Tamiflu. History includes hypertensive heart disease, chronic kidney disease , chronic heart failure. She has secondary hyperaldosteronism. She also has history of dyslipidemia and diabetes. In reference to her heart failure weight has been remarkably stable,varying only up to 4 pounds per month. Nitroglycerin as needed has not been required. Her diabetes is diet controlled. He has history of supraventricular tachycardia, rate is well controlled with a calcium channel blocker and beta blocker. Last labs 01/21/17. Creatinine was stable at 1.1. Glucose was 161. Last A1c on record was 6.5% on 07/10/16. At her age with her comorbidities her A1c goal is less than 8%. She is on both Aricept and Namenda for dementia.  Review of systems: Dementia invalidated responses. Date given as 2014 and president as Elmon KirschnerEisenhower or Montez MoritaCarter She does state that she has a stable cough productive of clear sputum. She states she has chronic bronchitis and is from a "asthmatic family". She also describes rhinitis without other extrinsic symptoms . She denies upper respiratory tract infection symptoms. She is not on ACE inhibitor.  States that she has decreased range of motion in her shoulders.  She has occasional heart skipping.  Constitutional: No fever,significant weight  change,persistent fatigue  Eyes: No redness, discharge, pain, vision change ENT/mouth: No nasal congestion,  purulent discharge, earache,change in hearing ,sore throat  Cardiovascular: No chest pain, palpitations,paroxysmal nocturnal dyspnea, claudication, edema  Respiratory: No hemoptysis, DOE , significant snoring,apnea  Gastrointestinal: No heartburn,dysphagia,abdominal pain, nausea / vomiting,rectal bleeding, melena,change in bowels Genitourinary: No dysuria,hematuria, pyuria,  incontinence, nocturia Dermatologic: No rash, pruritus, change in appearance of skin Neurologic: No headache,syncope, seizures, numbness , tingling Hematologic/lymphatic: No significant bruising, lymphadenopathy,abnormal bleeding Allergy/immunology: No itchy/ watery eyes, significant sneezing, urticaria, angioedema  Physical exam:  Pertinent or positive findings: nares are boggy. She has complete dentures. Has occasional premature beat. Grade 1 systolic murmur is present. She exhibits a nonproductive cough. She has bilateral expiratory rhonchi.  Reflexes are 0 1/2+ at the knees.  General appearance:Adequately nourished; no acute distress , increased work of breathing is present.   Lymphatic: No lymphadenopathy about the head, neck, axilla . Eyes: No conjunctival inflammation or lid edema is present. There is no scleral icterus. Ears:  External ear exam shows no significant lesions or deformities.   Nose:  External nasal examination shows no deformity or inflammation. Nasal mucosa are pink and moist without lesions ,exudates Oral exam: lips and gums are healthy appearing.There is no oropharyngeal erythema or exudate . Neck:  No thyromegaly, masses, tenderness noted.    Heart:  Normal rate and regular rhythm. S1 and S2 normal without gallop, click, rub .  Abdomen:Bowel sounds are normal. Abdomen is soft and nontender with no organomegaly, hernias,masses. GU: deferred  Extremities:  No cyanosis, clubbing,edema    Neurologic exam : In wheelchair Strength decreased  in upper & lower extremities Balance,Rhomberg,finger to nose testing  could not be completed due to clinical state Skin: Warm & dry w/o tenting. No significant lesions or rash.  See summary under each active problem in the Problem List with associated updated therapeutic plan

## 2017-03-11 NOTE — Assessment & Plan Note (Signed)
Update A1c ?

## 2017-03-11 NOTE — Patient Instructions (Signed)
See assessment and plan under each diagnosis in the problem list and acutely for this visit 

## 2017-03-11 NOTE — Assessment & Plan Note (Addendum)
Trial of Singular in view of the associated rhinitis PRN neb treatments if symptoms progress

## 2017-03-12 LAB — HEMOGLOBIN A1C: Hemoglobin A1C: 6.4

## 2017-03-20 LAB — TSH: TSH: 1.42 (ref 0.41–5.90)

## 2017-06-07 LAB — BASIC METABOLIC PANEL
BUN: 25 — AB (ref 4–21)
CREATININE: 0.9 (ref 0.5–1.1)
GLUCOSE: 90
Potassium: 4.2 (ref 3.4–5.3)
Sodium: 144 (ref 137–147)

## 2017-06-07 LAB — CBC AND DIFFERENTIAL
HCT: 37 (ref 36–46)
Hemoglobin: 11.6 — AB (ref 12.0–16.0)
Neutrophils Absolute: 5
PLATELETS: 230 (ref 150–399)
WBC: 7.4

## 2017-07-08 ENCOUNTER — Other Ambulatory Visit: Payer: Self-pay | Admitting: Internal Medicine

## 2017-07-08 ENCOUNTER — Non-Acute Institutional Stay (SKILLED_NURSING_FACILITY): Payer: Medicare Other | Admitting: Internal Medicine

## 2017-07-08 ENCOUNTER — Encounter: Payer: Self-pay | Admitting: Internal Medicine

## 2017-07-08 DIAGNOSIS — I10 Essential (primary) hypertension: Secondary | ICD-10-CM

## 2017-07-08 DIAGNOSIS — F0150 Vascular dementia without behavioral disturbance: Secondary | ICD-10-CM

## 2017-07-08 DIAGNOSIS — Z8639 Personal history of other endocrine, nutritional and metabolic disease: Secondary | ICD-10-CM | POA: Diagnosis not present

## 2017-07-08 DIAGNOSIS — D509 Iron deficiency anemia, unspecified: Secondary | ICD-10-CM | POA: Diagnosis not present

## 2017-07-08 DIAGNOSIS — R7303 Prediabetes: Secondary | ICD-10-CM | POA: Diagnosis not present

## 2017-07-08 NOTE — Assessment & Plan Note (Signed)
BP controlled; no change in antihypertensive medications  

## 2017-07-08 NOTE — Assessment & Plan Note (Addendum)
Anemia is stable and no change is indicated

## 2017-07-08 NOTE — Assessment & Plan Note (Signed)
Aricept and Namenda may not be very efficacious but discontinuing them possibly could exacerbate her disorientation as she is oriented only to person

## 2017-07-08 NOTE — Progress Notes (Signed)
NURSING HOME FACILITY:  [facility] ROOM NUMBER:  114-B  CODE STATUS:  DNR  PCP:  Pecola LawlessHopper, William F, MD  [address] [place] [id number]  This is a nursing facility follow up of chronic medical diagnoses  Interim medical record and care [facility] was updated with review of diagnostic studies and change in clinical status since last visit were documented.  HPI: The [facility] Nurse Practitioner saw the patient in routine follow up in 06/05/17 and found no acute changes in condition. Chronic anemia was assessed with repeat CBC which revealed a hemoglobin of 11.6, in her typical range of 11-12.4. The patient is on daily ferrous sulfate and oral B12 supplementation. Glucoses were noted to range from 90-161. A1c is borderline diabetic at 6.4% and stable. Patient has vascular dementia in the context of prior stroke. She is on Namenda and Aricept generics. She has a history of hyperthyroidism; the last TSH on record was 0.57 in 2016. This value had been relatively stable except on one occasion 2013-16.[name]. She has history of vitamin D deficiency, recent values on record are normal.  Review of systems: Dementia invalidated responses. Unable to give date or name of President. She was unsure where she was living and wanted to know why she was here. She denied any active complaints other than some "runny nose" intermittently.  Constitutional: No fever,significant weight change, fatigue  Eyes: No redness, discharge, pain, vision change ENT/mouth: No nasal congestion,  purulent discharge, earache,change in hearing ,sore throat  Cardiovascular: No chest pain, palpitations,paroxysmal nocturnal dyspnea, claudication, edema  Respiratory: No cough, sputum production,hemoptysis, DOE , significant snoring,apnea  Gastrointestinal: No heartburn,dysphagia,abdominal pain, nausea / vomiting,rectal bleeding, melena,change in bowels Genitourinary: No dysuria,hematuria, pyuria,   incontinence, nocturia Musculoskeletal: No joint stiffness, joint swelling, weakness,pain Dermatologic: No rash, pruritus, change in appearance of skin Neurologic: No dizziness,headache,syncope, seizures, numbness , tingling Psychiatric: No significant anxiety , depression, insomnia, anorexia Endocrine: No change in hair/skin/ nails, excessive thirst, excessive hunger, excessive urination  Hematologic/lymphatic: No significant bruising, lymphadenopathy,abnormal bleeding Allergy/immunology: No itchy/ watery eyes, significant sneezing, urticaria, angioedema  Physical exam:  Pertinent or positive findings: She appears younger than her stated age. She stares blankly ahead. Vision appears to be decreased bilaterally, but worse on the left. There is slight right exotropia. She has complete dentures. Minor rales are noted bilaterally. She has a soft grade 1/2 systolic murmur at the base with a slightly irregular rhythm. Pulses are decreased, especially dorsalis pedis pulses. Slight flexion contracture of the left fifth finger. Weakness is generalized & appears to be grossly equal.  General appearance:Adequately nourished; no acute distress , increased work of breathing is present.   Lymphatic: No lymphadenopathy about the head, neck, axilla . Eyes: No conjunctival inflammation or lid edema is present. There is no scleral icterus. Ears:  External ear exam shows no significant lesions or deformities.   Nose:  External nasal examination shows no deformity or inflammation. Nasal mucosa are pink and moist without lesions ,exudates Oral exam: lips and gums are healthy appearing.There is no oropharyngeal erythema or exudate . Neck:  No thyromegaly, masses, tenderness noted.    Heart:  No gallop, murmur, click, rub .  Lungs: without wheezes, rhonchi, rubs. Abdomen:Bowel sounds are normal. Abdomen is soft and nontender with no organomegaly, hernias,masses. GU: deferred  Extremities:  No cyanosis,  clubbing,edema  Neurologic exam : Balance,Rhomberg,finger to nose testing could not be completed due to clinical state Skin: Warm & dry w/o tenting.  No significant lesions or rash.  See summary under each active problem in the Problem List with associated updated therapeutic plan

## 2017-07-08 NOTE — Assessment & Plan Note (Signed)
Repeat TSH

## 2017-07-08 NOTE — Assessment & Plan Note (Signed)
Monitor A1c every 6-12 months

## 2017-07-08 NOTE — Patient Instructions (Signed)
See assessment and plan under each diagnosis in the problem list and acutely for this visit 

## 2017-07-09 LAB — TSH: TSH: 0.98 (ref 0.41–5.90)

## 2017-07-09 LAB — CBC AND DIFFERENTIAL
HEMATOCRIT: 37 (ref 36–46)
Hemoglobin: 12.6 (ref 12.0–16.0)
Neutrophils Absolute: 6
PLATELETS: 239 (ref 150–399)
WBC: 7.7

## 2017-07-17 LAB — HM DIABETES EYE EXAM

## 2017-08-14 ENCOUNTER — Non-Acute Institutional Stay (SKILLED_NURSING_FACILITY): Payer: Medicare Other

## 2017-08-14 DIAGNOSIS — Z0000 Encounter for general adult medical examination without abnormal findings: Secondary | ICD-10-CM | POA: Diagnosis not present

## 2017-08-14 NOTE — Progress Notes (Signed)
Subjective:   [name] is a 83 y.o. female who presents for an Initial Medicare Annual Wellness Visit at [facility]       Objective:    Today's Vitals   08/14/17 1356  BP: 122/64  Pulse: 61  Temp: 98 F (36.7 C)  TempSrc: Oral  SpO2: 92%  Weight: 113 lb (51.3 kg)  Height: 5\' 4"  (1.626 m)   Body mass index is 19.4 kg/m.   Current Medications (verified) Outpatient Encounter Prescriptions as of 08/14/2017  Medication Sig  . acetaminophen (TYLENOL) 325 MG tablet Take 650 mg by mouth 3 (three) times daily.  [name] aspirin 81 MG tablet Take 81 mg by mouth daily.  . barrier cream (NON-SPECIFIED) CREA Apply 1 application topically 2 (two) times daily. Apply to inner buttocks  . Cholecalciferol 50000 units TABS Take 50,000 Units by mouth every 30 (thirty) days.  [name] diltiazem (CARDIZEM) 90 MG tablet Take 90 mg by mouth 2 (two) times daily.  [name] donepezil (ARICEPT) 10 MG tablet Take 10 mg by mouth at bedtime.   . ferrous sulfate 325 (65 FE) MG tablet Take 325 mg by mouth daily with breakfast.  . furosemide (LASIX) 20 MG tablet Take 20 mg by mouth every other day.   [name] LORazepam (ATIVAN) 0.5 MG tablet Take 1/2 tablet (0.25 mg) by mouth 3 times a day. (control)  . Melatonin 3 MG CAPS Take 3 mg by mouth at bedtime.  . memantine (NAMENDA) 10 MG tablet Take 10 mg by mouth daily.  . metoprolol (LOPRESSOR) 50 MG tablet Take 1 1/2 tablets (75mg ) by mouth twice a day. Hold for HR<60 and notify [facility]  . nitroGLYCERIN (NITROSTAT) 0.4 MG SL tablet Place 0.4 mg under the tongue every 5 (five) minutes as needed for chest pain.  . NON FORMULARY Med Pass 120 mL by mouth two times daily  . Olopatadine HCl (PATADAY) 0.2 % SOLN Apply 1 drop to eye daily. Both eyes for ocular allergies  . polyethylene glycol (MIRALAX / GLYCOLAX) packet Take 17 g by mouth daily.  [name] Propylene Glycol (SYSTANE BALANCE) 0.6 % SOLN Place 1 drop into both eyes 2 (two) times daily.  . sertraline (ZOLOFT) 50 MG  tablet Take 50 mg by mouth daily.  [name] UNABLE TO FIND Med Name: Magic cup twice daily  . vitamin B-12 (CYANOCOBALAMIN) 1000 MCG tablet Take 1,000 mcg by mouth every other day.    No facility-administered encounter medications on file as of 08/14/2017.     Allergies (verified) Codeine and Penicillins   History: Past Medical History:  Diagnosis Date  . Altered mental status 01/01/2011   Qualifier: Diagnosis of  By: Earnest Bailey MD, Selena Batten    . Anemia   . Anxiety 03/09/2015  . Arthritis   . Asthma   . CAD (coronary artery disease)    a. reports prior h/o MI's with possible cath @ [facility] in [place] 'many years ago.'  . CAROTID ARTERY DISEASE 07/16/2007   a. 07/2012 U/S: no signif extracranial carotid artery stenosis, antegrad vertebral flow.  . CKD (chronic kidney disease), stage II   . CVA (cerebral vascular accident) (HCC)    a. She's not sure that this is true.  [name] DEFICIENCY, VITAMIN D NOS 07/16/2007   Qualifier: Diagnosis of  By: Irving Burton MD, Clifton Custard    . Diabetes mellitus, type 2 (HCC)   . Diastolic CHF, chronic (HCC)    a. 07/2013 Echo: EF 55-60%.  . Fibromyalgia   . H/O  hiatal hernia   . Headache(784.0)   . High risk social situations 11/28/2011  . HLD (hyperlipidemia)   . HTN (hypertension)   . Hyperthyroidism   . Intention tremor 03/03/2007   Qualifier: Diagnosis of  By: Dorathy Daft  MD, IDAYLIS    . Neuromuscular disorder (HCC)   . Palpitations    a. 8.2014 Echo: EF 55-60%, no rwma, mod LVH, mildly dil LA, PASP .  [name] Recurrent upper respiratory infection (URI)   . Seizures (HCC)   . Syncope, near 02/28/2012  . Systolic murmur    a. no significant valvular abnormalities on echo 8.2014.  . TOBACCO USER   . Tremor, essential   . Urinary incontinence   . Vitamin D deficiency    Past Surgical History:  Procedure Laterality Date  . APPENDECTOMY  1965  . KNEE SURGERY  1989, 1995  . VAGINAL HYSTERECTOMY  1965   Family History  Problem Relation Age of Onset  . Diabetes  Mother   . Diabetes Brother   . Stroke Father    Social History   Occupational History  . Retired Retired    from [place] [employer]   Social History Main Topics  . Smoking status: Former Smoker    Packs/day: 0.50    Types: Cigarettes    Quit date: 04/06/2012  . Smokeless tobacco: Never Used     Comment: pt says she smokes on and off currently  . Alcohol use No  . Drug use: No  . Sexual activity: No    Tobacco Counseling Counseling given: Not Answered   Activities of Daily Living In your present state of health, do you have any difficulty performing the following activities: 08/14/2017  Hearing? Y  Vision? N  Difficulty concentrating or making decisions? Y  Walking or climbing stairs? Y  Dressing or bathing? Y  Doing errands, shopping? Y  Preparing Food and eating ? Y  Using the Toilet? Y  In the past six months, have you accidently leaked urine? Y  Do you have problems with loss of bowel control? N  Managing your Medications? Y  Managing your Finances? Y  Housekeeping or managing your Housekeeping? Y  Some recent data might be hidden    Immunizations and Health Maintenance Immunization History  Administered Date(s) Administered  . Influenza-Unspecified 09/28/2014, 10/04/2015, 09/19/2016  . PPD Test 07/15/2014  . Pneumococcal Polysaccharide-23 09/22/1994  . Pneumococcal-Unspecified 08/05/2014, 09/19/2016  . Td 05/23/2004  . Tdap 02/25/2015   Health Maintenance Due  Topic Date Due  . URINE MICROALBUMIN  02/24/2017  . INFLUENZA VACCINE  07/23/2017    Patient Care Team: Pecola Lawless, MD as PCP - General (Internal Medicine)  Indicate any recent Medical Services you may have received from other than [facility] providers in the past year (date may be approximate).     Assessment:   This is a routine wellness examination for [place].   Hearing/Vision screen No exam data present  Dietary issues and exercise activities discussed: Current Exercise Habits: The  patient does not participate in regular exercise at present, Exercise limited by: orthopedic condition(s)  Goals    None     Depression Screen PHQ 2/9 Scores 08/14/2017 03/19/2012  PHQ - 2 Score 0 0    Fall Risk Fall Risk  08/14/2017 09/24/2012 09/21/2012 03/19/2012  Falls in the past year? No - - -  Risk for fall due to : - Impaired balance/gait;Impaired mobility Impaired mobility History of fall(s);Impaired balance/gait;Impaired mobility  Risk for fall due to: Comment - -  Using cane -    Cognitive Function: MMSE - Mini Mental State Exam 09/24/2012 03/19/2012  Orientation to time 4 4  Orientation to Place 5 5  Registration 3 3  Attention/ Calculation 5 5  Recall 1 0  Language- name 2 objects 2 2  Language- repeat 1 1  Language- follow 3 step command 3 3  Language- read & follow direction 1 1  Write a sentence 1 1  Copy design 0 1  Total score 26 26     6CIT Screen 08/14/2017  What Year? 4 points  What month? 3 points  What time? 0 points  Count back from 20 0 points  Months in reverse 4 points  Repeat phrase 10 points  Total Score 21    Screening Tests Health Maintenance  Topic Date Due  . URINE MICROALBUMIN  02/24/2017  . INFLUENZA VACCINE  07/23/2017  . HEMOGLOBIN A1C  09/12/2017  . PNA vac Low Risk Adult (2 of 2 - PCV13) 09/19/2017  . FOOT EXAM  07/11/2018  . OPHTHALMOLOGY EXAM  07/17/2018  . TETANUS/TDAP  02/24/2025  . DEXA SCAN  Completed      Plan:    I have personally reviewed and addressed the Medicare Annual Wellness questionnaire and have noted the following in the patient's chart:  A. Medical and social history B. Use of alcohol, tobacco or illicit drugs  C. Current medications and supplements D. Functional ability and status E.  Nutritional status F.  Physical activity G. Advance directives H. List of other physicians I.  Hospitalizations, surgeries, and ER visits in previous 12 months J.  Vitals K. Screenings to include hearing, vision,  cognitive, depression L. Referrals and appointments - none  In addition, I have reviewed and discussed with patient certain preventive protocols, quality metrics, and best practice recommendations. A written personalized care plan for preventive services as well as general preventive health recommendations were provided to patient.  See attached scanned questionnaire for additional information.   Signed,   Annetta Maw, RN Nurse Health Advisor   Quick Notes   Health Maintenance: Microalbumin due     Abnormal Screen: O2-92%, 6 CIT-21     Patient Concerns: None  I have personally reviewed the health advisor's clinical note, was available for consultation, and agree with the assessment and plan as written. Pecola Lawless M.D., FACP, [facility]    Nurse Concerns: None

## 2017-08-14 NOTE — Patient Instructions (Signed)
[name] , Thank you for taking time to come for your Medicare Wellness Visit. I appreciate your ongoing commitment to your health goals. Please review the following plan we discussed and let me know if I can assist you in the future.   Screening recommendations/referrals: Colonoscopy excluded, pt over age 81 Mammogram excluded, pt over age 30 Bone Density up to date Recommended yearly ophthalmology/optometry visit for glaucoma screening and checkup Recommended yearly dental visit for hygiene and checkup  Vaccinations: Influenza vaccine due 2018 flu season Pneumococcal vaccine up to date Tdap vaccine due 02/24/25 Shingles vaccine not in records  Advanced directives: DNR in chart, copies of health care power of attorney and living will are needed   Conditions/risks identified: None  Next appointment: Dr. Alwyn Ren makes rounds   Preventive Care 65 Years and Older, Female Preventive care refers to lifestyle choices and visits with your health care provider that can promote health and wellness. What does preventive care include?  A yearly physical exam. This is also called an annual well check.  Dental exams once or twice a year.  Routine eye exams. Ask your health care provider how often you should have your eyes checked.  Personal lifestyle choices, including:  Daily care of your teeth and gums.  Regular physical activity.  Eating a healthy diet.  Avoiding tobacco and drug use.  Limiting alcohol use.  Practicing safe sex.  Taking low-dose aspirin every day.  Taking vitamin and mineral supplements as recommended by your health care provider. What happens during an annual well check? The services and screenings done by your health care provider during your annual well check will depend on your age, overall health, lifestyle risk factors, and family history of disease. Counseling  Your health care provider may ask you questions about your:  Alcohol use.  Tobacco  use.  Drug use.  Emotional well-being.  Home and relationship well-being.  Sexual activity.  Eating habits.  History of falls.  Memory and ability to understand (cognition).  Work and work Astronomer.  Reproductive health. Screening  You may have the following tests or measurements:  Height, weight, and BMI.  Blood pressure.  Lipid and cholesterol levels. These may be checked every 5 years, or more frequently if you are over 59 years old.  Skin check.  Lung cancer screening. You may have this screening every year starting at age 25 if you have a 30-pack-year history of smoking and currently smoke or have quit within the past 15 years.  Fecal occult blood test (FOBT) of the stool. You may have this test every year starting at age 45.  Flexible sigmoidoscopy or colonoscopy. You may have a sigmoidoscopy every 5 years or a colonoscopy every 10 years starting at age 58.  Hepatitis C blood test.  Hepatitis B blood test.  Sexually transmitted disease (STD) testing.  Diabetes screening. This is done by checking your blood sugar (glucose) after you have not eaten for a while (fasting). You may have this done every 1-3 years.  Bone density scan. This is done to screen for osteoporosis. You may have this done starting at age 10.  Mammogram. This may be done every 1-2 years. Talk to your health care provider about how often you should have regular mammograms. Talk with your health care provider about your test results, treatment options, and if necessary, the need for more tests. Vaccines  Your health care provider may recommend certain vaccines, such as:  Influenza vaccine. This is recommended every year.  Tetanus, diphtheria, and acellular pertussis (Tdap, Td) vaccine. You may need a Td booster every 10 years.  Zoster vaccine. You may need this after age 52.  Pneumococcal 13-valent conjugate (PCV13) vaccine. One dose is recommended after age 26.  Pneumococcal  polysaccharide (PPSV23) vaccine. One dose is recommended after age 84. Talk to your health care provider about which screenings and vaccines you need and how often you need them. This information is not intended to replace advice given to you by your health care provider. Make sure you discuss any questions you have with your health care provider. Document Released: 01/05/2016 Document Revised: 08/28/2016 Document Reviewed: 10/10/2015 Elsevier Interactive Patient Education  2017 [place] Prevention in the Home Falls can cause injuries. They can happen to people of all ages. There are many things you can do to make your home safe and to help prevent falls. What can I do on the outside of my home?  Regularly fix the edges of walkways and driveways and fix any cracks.  Remove anything that might make you trip as you walk through a door, such as a raised step or threshold.  Trim any bushes or trees on the path to your home.  Use bright outdoor lighting.  Clear any walking paths of anything that might make someone trip, such as rocks or tools.  Regularly check to see if handrails are loose or broken. Make sure that both sides of any steps have handrails.  Any raised decks and porches should have guardrails on the edges.  Have any leaves, snow, or ice cleared regularly.  Use sand or salt on walking paths during winter.  Clean up any spills in your garage right away. This includes oil or grease spills. What can I do in the bathroom?  Use night lights.  Install grab bars by the toilet and in the tub and shower. Do not use towel bars as grab bars.  Use non-skid mats or decals in the tub or shower.  If you need to sit down in the shower, use a plastic, non-slip stool.  Keep the floor dry. Clean up any water that spills on the floor as soon as it happens.  Remove soap buildup in the tub or shower regularly.  Attach bath mats securely with double-sided non-slip rug  tape.  Do not have throw rugs and other things on the floor that can make you trip. What can I do in the bedroom?  Use night lights.  Make sure that you have a light by your bed that is easy to reach.  Do not use any sheets or blankets that are too big for your bed. They should not hang down onto the floor.  Have a firm chair that has side arms. You can use this for support while you get dressed.  Do not have throw rugs and other things on the floor that can make you trip. What can I do in the kitchen?  Clean up any spills right away.  Avoid walking on wet floors.  Keep items that you use a lot in easy-to-reach places.  If you need to reach something above you, use a strong step stool that has a grab bar.  Keep electrical cords out of the way.  Do not use floor polish or wax that makes floors slippery. If you must use wax, use non-skid floor wax.  Do not have throw rugs and other things on the floor that can make you trip. What can I do  with my stairs?  Do not leave any items on the stairs.  Make sure that there are handrails on both sides of the stairs and use them. Fix handrails that are broken or loose. Make sure that handrails are as long as the stairways.  Check any carpeting to make sure that it is firmly attached to the stairs. Fix any carpet that is loose or worn.  Avoid having throw rugs at the top or bottom of the stairs. If you do have throw rugs, attach them to the floor with carpet tape.  Make sure that you have a light switch at the top of the stairs and the bottom of the stairs. If you do not have them, ask someone to add them for you. What else can I do to help prevent falls?  Wear shoes that:  Do not have high heels.  Have rubber bottoms.  Are comfortable and fit you well.  Are closed at the toe. Do not wear sandals.  If you use a stepladder:  Make sure that it is fully opened. Do not climb a closed stepladder.  Make sure that both sides of the  stepladder are locked into place.  Ask someone to hold it for you, if possible.  Clearly mark and make sure that you can see:  Any grab bars or handrails.  First and last steps.  Where the edge of each step is.  Use tools that help you move around (mobility aids) if they are needed. These include:  Canes.  Walkers.  Scooters.  Crutches.  Turn on the lights when you go into a dark area. Replace any light bulbs as soon as they burn out.  Set up your furniture so you have a clear path. Avoid moving your furniture around.  If any of your floors are uneven, fix them.  If there are any pets around you, be aware of where they are.  Review your medicines with your doctor. Some medicines can make you feel dizzy. This can increase your chance of falling. Ask your doctor what other things that you can do to help prevent falls. This information is not intended to replace advice given to you by your health care provider. Make sure you discuss any questions you have with your health care provider. Document Released: 10/05/2009 Document Revised: 05/16/2016 Document Reviewed: 01/13/2015 Elsevier Interactive Patient Education  2017 [employer].

## 2017-08-18 LAB — BASIC METABOLIC PANEL
BUN: 27 — AB (ref 4–21)
CREATININE: 1.1 (ref 0.5–1.1)
Glucose: 128
POTASSIUM: 4.4 (ref 3.4–5.3)
SODIUM: 143 (ref 137–147)

## 2017-08-18 LAB — CBC AND DIFFERENTIAL
HEMATOCRIT: 37 (ref 36–46)
HEMOGLOBIN: 11.9 — AB (ref 12.0–16.0)
Neutrophils Absolute: 10
Platelets: 269 (ref 150–399)
WBC: 12.3

## 2017-08-20 ENCOUNTER — Encounter: Payer: Self-pay | Admitting: Internal Medicine

## 2017-08-20 ENCOUNTER — Non-Acute Institutional Stay (SKILLED_NURSING_FACILITY): Payer: Medicare Other | Admitting: Internal Medicine

## 2017-08-20 DIAGNOSIS — I959 Hypotension, unspecified: Secondary | ICD-10-CM | POA: Diagnosis not present

## 2017-08-20 DIAGNOSIS — J209 Acute bronchitis, unspecified: Secondary | ICD-10-CM

## 2017-08-20 NOTE — Progress Notes (Signed)
    NURSING HOME LOCATION:  [facility] ROOM NUMBER:  [id number]  CODE STATUS:  DNR  PCP:  Pecola LawlessHopper, William F, MD  [address] [place] [id number]  This is a nursing facility follow up for specific acute issue of cough.  Interim medical record and care [facility] was updated with review of diagnostic studies and change in clinical status since last visit were documented.  HPI:the [facility] NP saw [name] 08/18/17 for cough, congestion, and O2 sats 86% on room air. Chest x-ray revealed no acute infiltrate. That film was personally reviewed by me. The most striking finding in addition to osteopenia includes hyperinflation. The interstitial markings are accentuated. The right hemidiaphragm is elevated. There is a tissue density in the right lower lobe medially which may represent some atelectasis. White count was 12,000. Nebulizer treatment, Zpack and oral prednisone was initiated. The patient has severe dementia could not provide meaningful history. She states that she has had shortness of breath "a lot", but "not now". She stated that her cough is nonproductive and then slightly later said that she was having some "little clear". She describes a history of asthma in herself as well as in both sides of her family. She states she quit smoking "3 years ago". She gives that date as "1953".   Review of systems: Dementia invalidated responses. Unable to give date .  Physical exam:  Pertinent or positive findings: Initially she had a resting exotropia on the right. Has complete dentures. She has grade 1 systolic murmur and occasional prematures. Intermittent brassy, nonproductive cough. Low grade juicy rales and rhonchi diffusely and symmetrically. Decreased pedal pulses.  General appearance:Thin but adequately nourished; no acute distress , increased work of breathing is present.   Lymphatic: No lymphadenopathy about the head, neck, axilla . Eyes: No conjunctival  inflammation or lid edema is present. There is no scleral icterus. Ears:  External ear exam shows no significant lesions or deformities.   Nose:  External nasal examination shows no deformity or inflammation. Nasal mucosa are pink and moist without lesions ,exudates Oral exam: lips and gums are healthy appearing.There is no oropharyngeal erythema or exudate . Neck:  No thyromegaly, masses, tenderness noted.    Heart:  No gallop, click, rub .  Abdomen:Bowel sounds are normal. Abdomen is soft and nontender with no organomegaly, hernias,masses. GU: deferred  Extremities:  No cyanosis, clubbing,edema  Skin: Warm & dry w/o tenting. No significant lesions or rash.  #1 acute bronchitis with bronchospasm ; no acute infiltrate on chest x-ray  #2 relative hypotension on metoprolol 75 mg twice a day Plan:pulmonary toilet, steroids, and antibiotics The selective beta blocker should not be exacerbating her bronchospasm but blood pressure is low. Metoprolol will be decreased to 50 mg twice a day. It will be held if pulse is less than 60 or systolic blood pressure less than 90.

## 2017-08-21 ENCOUNTER — Encounter: Payer: Self-pay | Admitting: Internal Medicine

## 2017-08-21 NOTE — Patient Instructions (Signed)
Plan: See orders and recommendations 

## 2017-08-28 ENCOUNTER — Encounter: Payer: Self-pay | Admitting: Internal Medicine

## 2017-08-28 DIAGNOSIS — J449 Chronic obstructive pulmonary disease, unspecified: Secondary | ICD-10-CM | POA: Insufficient documentation

## 2017-08-28 LAB — CBC AND DIFFERENTIAL
HEMATOCRIT: 38 (ref 36–46)
HEMOGLOBIN: 11.9 — AB (ref 12.0–16.0)
Neutrophils Absolute: 13
Platelets: 336 (ref 150–399)
WBC: 14.1

## 2017-10-01 LAB — LIPID PANEL
Cholesterol: 196 (ref 0–200)
HDL: 56 (ref 35–70)
LDL CALC: 127
LDL/HDL RATIO: 3.5
Triglycerides: 67 (ref 40–160)

## 2017-11-20 ENCOUNTER — Encounter: Payer: Self-pay | Admitting: Internal Medicine

## 2017-11-20 ENCOUNTER — Non-Acute Institutional Stay (SKILLED_NURSING_FACILITY): Payer: Medicare Other | Admitting: Internal Medicine

## 2017-11-20 DIAGNOSIS — F0150 Vascular dementia without behavioral disturbance: Secondary | ICD-10-CM

## 2017-11-20 DIAGNOSIS — J410 Simple chronic bronchitis: Secondary | ICD-10-CM

## 2017-11-20 DIAGNOSIS — D509 Iron deficiency anemia, unspecified: Secondary | ICD-10-CM | POA: Diagnosis not present

## 2017-11-20 DIAGNOSIS — I10 Essential (primary) hypertension: Secondary | ICD-10-CM

## 2017-11-20 NOTE — Assessment & Plan Note (Addendum)
A trial off Namenda will be conducted as vascular dementia is not likely to respond to Namenda. Aricept can be discontinued if she remains stable. I will not initiate statin at age 83.

## 2017-11-20 NOTE — Assessment & Plan Note (Signed)
BP controlled; no change in antihypertensive medications  

## 2017-11-20 NOTE — Assessment & Plan Note (Addendum)
Anemia has been stable  She has been on vitamin B12 supplement, level should be rechecked as this was last on over a year ago

## 2017-11-20 NOTE — Progress Notes (Signed)
NURSING HOME LOCATION:  [facility] ROOM NUMBER:  [id number]  CODE STATUS:  DNR  PCP:  Pecola LawlessHopper, William F, MD  [address] [place] [id number]  This is a nursing facility follow up of chronic medical diagnoses  Interim medical record and care since last [facility] visit was updated with review of diagnostic studies and change in clinical status since last visit were documented.  HPI:   Last seen in August the patient was having a COPD exacerbation. The symptoms have improved and she has no active symptoms at this time except for occasional shortness of breath. She states that this is better since she "quit smoking 3-4 months ago". She said she smoked up to 2 packs per day. The patient is a long-term resident and has been a nonsmoker here.Epic lists quit date as 2013. No behavioral issues have been reported ; today the patient does confabulate that her right shoe was off her heel " because someone was wearing my shoes". She gives an elaborate history of taking multiple languages in [place] in [place] a child/young adult. 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 She has vascular dementia with prior history of stroke and coronary disease. She also has had chronic diastolic heart failure and paroxysmal supraventricular tachycardia. Diagnoses also include history of hyperthyroidism, prediabetes, and dyslipidemia. She has had a chronic anemia which has been stable on serial determinations. Her last TSH was 0.98 in July. Lipids revealed an LDL of 127 on 10/10. She remains on low-dose aspirin prophylactically. She's not on a statin. She is on B12 supplement orally. Although she is diagnosed with vascular dementia & not Alzheimer'sjuncture she remains on Aricept and Namenda.  Review of systems: Dementia invalidated responses. Date given as October 25,????. She states "I have trouble  with the year". She was unable to identify the president.  Physical exam:  Pertinent or positive findings: She appears younger than her stated age. She has variable exotropia involving both the right and left eyes. There is slight proptosis of the left eye. She has complete dentures. She has low-grade diffuse rhonchi. She has a grade 2 systolic murmur @ the left sternal border. Second heart sound is increased. She has trace-1/2+ edema. Pedal pulses are decreased. When asked to say "good morning" in [place], she replied in [place].  General appearance:Adequately nourished; no acute distress , increased work of breathing is present.   Lymphatic: No lymphadenopathy about the head, neck, axilla . Eyes: No conjunctival inflammation or lid edema is present. There is no scleral icterus. Ears:  External ear exam shows no significant lesions or deformities.   Nose:  External nasal examination shows no deformity or inflammation. Nasal mucosa are pink and moist without lesions ,exudates Oral exam: lips and gums are healthy appearing.There is no oropharyngeal erythema or exudate . Neck:  No thyromegaly, masses, tenderness noted.    Heart:  Normal rate and regular rhythm. S1  normal without gallop,  click, rub .  Lungs: without wheezes, rales , rubs. Abdomen:Bowel sounds are normal. Abdomen is soft and nontender with no organomegaly, hernias,masses. GU: deferred  Extremities:  No cyanosis, clubbing  Neurologic exam : Strength equal  in upper & lower extremities but decreased Balance,Rhomberg,finger to nose testing could not be completed due to clinical state Skin: Warm & dry w/o tenting. No significant lesions or rash.  See summary under each active problem in  the Problem List with associated updated therapeutic plan

## 2017-11-20 NOTE — Patient Instructions (Signed)
See assessment and plan under each diagnosis in the problem list and acutely for this visit 

## 2017-11-20 NOTE — Assessment & Plan Note (Addendum)
LAMA/LABA could be considered long-term if she has repeated exacerbations. The inhaled steroids would be indicated only if she had eosinophilia. The only eosinophil count in Epic was 2%, normal

## 2017-12-26 LAB — CBC AND DIFFERENTIAL
HCT: 37 (ref 36–46)
Hemoglobin: 11.7 — AB (ref 12.0–16.0)
NEUTROS ABS: 6
Platelets: 303 (ref 150–399)
WBC: 7.9

## 2017-12-26 LAB — BASIC METABOLIC PANEL
BUN: 20 (ref 4–21)
Creatinine: 0.9 (ref 0.5–1.1)
GLUCOSE: 97
Potassium: 3.7 (ref 3.4–5.3)
Sodium: 143 (ref 137–147)

## 2017-12-26 LAB — HEPATIC FUNCTION PANEL
ALK PHOS: 77 (ref 25–125)
ALT: 14 (ref 7–35)
AST: 18 (ref 13–35)
BILIRUBIN, TOTAL: 0.2

## 2017-12-26 LAB — VITAMIN D 25 HYDROXY (VIT D DEFICIENCY, FRACTURES): VIT D 25 HYDROXY: 52.35

## 2018-02-03 LAB — HEMOGLOBIN A1C: Hemoglobin A1C: 6.2

## 2018-02-11 LAB — HEMOGLOBIN A1C: HEMOGLOBIN A1C: 6.2

## 2018-03-19 ENCOUNTER — Non-Acute Institutional Stay (SKILLED_NURSING_FACILITY): Payer: Medicare Other | Admitting: Internal Medicine

## 2018-03-19 ENCOUNTER — Encounter: Payer: Self-pay | Admitting: Internal Medicine

## 2018-03-19 DIAGNOSIS — Z8639 Personal history of other endocrine, nutritional and metabolic disease: Secondary | ICD-10-CM | POA: Diagnosis not present

## 2018-03-19 DIAGNOSIS — J449 Chronic obstructive pulmonary disease, unspecified: Secondary | ICD-10-CM

## 2018-03-19 DIAGNOSIS — I471 Supraventricular tachycardia: Secondary | ICD-10-CM | POA: Diagnosis not present

## 2018-03-19 DIAGNOSIS — F0150 Vascular dementia without behavioral disturbance: Secondary | ICD-10-CM

## 2018-03-19 DIAGNOSIS — M810 Age-related osteoporosis without current pathological fracture: Secondary | ICD-10-CM

## 2018-03-19 NOTE — Progress Notes (Signed)
    NURSING HOME LOCATION:  [facility] ROOM NUMBER:  [id number]  CODE STATUS:  DNR  PCP:  Pecola LawlessHopper, William F, MD  [address] [place] [id number]  This is a nursing facility follow up of chronic medical diagnoses  Interim medical record and care [facility] was updated with review of diagnostic studies and change in clinical status since last visit were documented.  HPI: The patient is a permanent resident of facility with medical diagnoses of chronic anemia, hyperglycemia, vascular dementia, vitamin D deficiency and hypothyroidism. Labs are relatively current. Chemistries and renal function were normal on 12/26/17. Her anemia remained stable with a hemoglobin 11.7. A1c is prediabetic at 6.2%.  Review of systems: Dementia invalidated responses. She repeatedly asked me where she was. [place] in life. She voices no definite complaints  Constitutional: No fever, significant weight change, fatigue  Eyes: No redness, discharge, pain, vision change ENT/mouth: No nasal congestion,  purulent discharge, earache, change in hearing, sore throat  Cardiovascular: No chest pain, palpitations, paroxysmal nocturnal dyspnea, claudication, edema  Respiratory: No cough, sputum production, hemoptysis, DOE , significant snoring, apnea   Gastrointestinal: No heartburn, dysphagia, abdominal pain, nausea /vomiting, rectal bleeding, melena, change in bowels Genitourinary: No dysuria, hematuria, pyuria, incontinence, nocturia Musculoskeletal: No joint stiffness, joint swelling, weakness, pain Dermatologic: No rash, pruritus, change in appearance of skin Neurologic: No dizziness, headache, syncope, seizures, numbness, tingling Psychiatric: No significant anxiety, depression, insomnia, anorexia Endocrine: No change in hair/skin/ nails, excessive thirst, excessive hunger, excessive urination    Hematologic/lymphatic: No significant bruising, lymphadenopathy, abnormal bleeding Allergy/immunology: No itchy/watery eyes, significant sneezing, urticaria, angioedema  Physical exam:  Pertinent or positive findings:Thin & suboptimally nourished. Arcus senilis suggested. She has complete dentures. Low-grade rales are present. Grade 1 systolic murmur loudest at the epigastric and lower sternal area. Pedal pulses decreased. She has clubbing of the nailbeds. There is interosseous wasting of the hands. She symmetrically weak in all extremities.  General appearance: no acute distress, increased work of breathing is present.   Lymphatic: No lymphadenopathy about the head, neck, axilla. Eyes: No conjunctival inflammation or lid edema is present. There is no scleral icterus. Ears:  External ear exam shows no significant lesions or deformities.   Nose:  External nasal examination shows no deformity or inflammation. Nasal mucosa are pink and moist without lesions, exudates Oral exam:  Lips and gums are healthy appearing. There is no oropharyngeal erythema or exudate. Neck:  No thyromegaly, masses, tenderness noted.    Heart:  Normal rate and regular rhythm. S1 and S2 normal without gallop, click, rub .  Lungs: without wheezes, rhonchi , rubs. Abdomen:Bowel sounds are normal. Abdomen is soft and nontender with no organomegaly, hernias,masses. GU: deferred  Extremities:  No cyanosis, edema  Skin: Warm & dry w/o tenting. No significant lesions or rash.  See summary under each active problem in the Problem List with associated updated therapeutic plan

## 2018-03-19 NOTE — Assessment & Plan Note (Addendum)
Clinically euthyroid Recheck TSH

## 2018-03-19 NOTE — Assessment & Plan Note (Signed)
Clinically she has a regular rhythm

## 2018-03-19 NOTE — Assessment & Plan Note (Addendum)
03/19/18 minor rales on auscultation,clinically her bronchospastic lung disease is significantly improved and stable Pulmonary toilet as needed will be continued

## 2018-03-19 NOTE — Patient Instructions (Signed)
See assessment and plan under each diagnosis in the problem list and acutely for this visit 

## 2018-03-19 NOTE — Assessment & Plan Note (Signed)
Vitamin D level  52.35 in 12/2017 No change in monthly vit D dose

## 2018-03-19 NOTE — Assessment & Plan Note (Addendum)
03/19/18 she is pleasantly demented, this is more obvious today than usually so Aricept will be continued

## 2018-05-13 LAB — CBC AND DIFFERENTIAL
HEMATOCRIT: 36 (ref 36–46)
Hemoglobin: 11.8 — AB (ref 12.0–16.0)
NEUTROS ABS: 5
Platelets: 279 (ref 150–399)
WBC: 6.8

## 2018-05-13 LAB — BASIC METABOLIC PANEL
BUN: 17 (ref 4–21)
Creatinine: 0.9 (ref 0.5–1.1)
GLUCOSE: 76
Potassium: 4.2 (ref 3.4–5.3)
SODIUM: 141 (ref 137–147)

## 2018-05-28 ENCOUNTER — Non-Acute Institutional Stay (SKILLED_NURSING_FACILITY): Payer: Medicare Other | Admitting: Internal Medicine

## 2018-05-28 ENCOUNTER — Encounter: Payer: Self-pay | Admitting: Internal Medicine

## 2018-05-28 DIAGNOSIS — Z8639 Personal history of other endocrine, nutritional and metabolic disease: Secondary | ICD-10-CM

## 2018-05-28 DIAGNOSIS — I10 Essential (primary) hypertension: Secondary | ICD-10-CM | POA: Diagnosis not present

## 2018-05-28 DIAGNOSIS — F0150 Vascular dementia without behavioral disturbance: Secondary | ICD-10-CM | POA: Diagnosis not present

## 2018-05-28 DIAGNOSIS — J410 Simple chronic bronchitis: Secondary | ICD-10-CM | POA: Diagnosis not present

## 2018-05-28 NOTE — Assessment & Plan Note (Addendum)
TSH therapeutic

## 2018-05-28 NOTE — Assessment & Plan Note (Signed)
BP controlled; no change in antihypertensive medications  

## 2018-05-28 NOTE — Patient Instructions (Signed)
See assessment and plan under each diagnosis in the problem list and acutely for this visit 

## 2018-05-28 NOTE — Assessment & Plan Note (Addendum)
6/6/619 patient exhibits nonproductive cough and rhonchi but denies any active respiratory symptoms and O2 sats are adequate Monitor for progression Continue pulmonary toilet as needed

## 2018-05-28 NOTE — Progress Notes (Signed)
NURSING HOME LOCATION:  [facility] ROOM NUMBER:  [id number]  CODE STATUS:  DNR  PCP:  Pecola LawlessHopper, Camry Robello F, MD  [address] [place] [id number]  This is a nursing facility follow up of chronic medical diagnoses    Interim medical record and care since [facility] was updated with review of diagnostic studies and change in clinical status since last visit were documented.  HPI: The patient is a permanent resident of the facility with medical diagnoses of seizure disorder, essential tremor, hypothyroidism, essential hypertension, dyslipidemia, fibromyalgia, chronic diastolic heart failure, diabetes, history of stroke, COPD with asthmatic component,CKD II, & history MI. Labs were performed 05/13/18 ; renal function & electrolytes were normal; CBC was normal except for minimal decrease in hemoglobin to 11.8. She has a history of hyperthyroidism;TSH was therapeutic @ 1.18 on 05/13/18.   Review of systems: Dementia invalidated responses. She was unable to give the year; she did think that it was the second or third week of November. She did not know the name of [place]. She did not think [place] was in [place]. She states that she has bronchitis and asthma and does cough sometimes. She began to confabulate about having a bladder problem as she is "of the [place] race". She states she[place] When I  asked her to spell the name of the tribe she became frustrated and said "just  write down [place]" She claims to speak multiple foreign languages . She was actually able to say good morning in [place]. Wound Care is treating LLE wound.  Constitutional: No fever, significant weight change, fatigue  Eyes: No redness, discharge, pain, vision change ENT/mouth: No nasal congestion,  purulent discharge, earache, change in hearing, sore throat  Cardiovascular: No chest pain, palpitations, paroxysmal nocturnal dyspnea, claudication, edema  Respiratory:  No  sputum production, hemoptysis   Allergy/immunology: No itchy/watery eyes, significant sneezing, urticaria, angioedema  Physical exam:  Pertinent or positive findings: She is frail and sits in a wheelchair. Despite the normal ambient temperature in the SNF, she's wearing at least 2 sweaters. She is hard of hearing. There is slight asymmetry of the nasolabial folds. She has complete dentures. She has intermittent exotropia of the left eye. She denies any visual deficit but was unable to count fingers correctly with either eye. She has intermittent nonproductive cough but denies having such. She has low-grade diffuse rhonchi. Heart sounds are distant. Peripheral pulses are decreased. She's wearing anti-wandering bracelet on the right lower extremity. She has interosseous wasting of the hands.  General appearance:  no acute distress, increased work of breathing is present.   Lymphatic: No lymphadenopathy about the head, neck, axilla. Eyes: No conjunctival inflammation or lid edema is present. There is no scleral icterus. Ears:  External ear exam shows no significant lesions or deformities.   Nose:  External nasal examination shows no deformity or inflammation. Nasal mucosa are pink and moist without lesions, exudates Oral exam:  Lips and gums are healthy appearing. There is no oropharyngeal erythema or exudate. Neck:  No thyromegaly, masses, tenderness noted.    Heart:  No gallop, murmur, click, rub .  Abdomen: Bowel sounds are normal. Abdomen is soft and nontender with no organomegaly, hernias, masses. GU: Deferred  Extremities:  No cyanosis, clubbing, edema  Skin: Warm & dry w/o tenting. No significant  rash.  See summary under each active problem in the Problem List with associated updated therapeutic plan

## 2018-05-28 NOTE — Assessment & Plan Note (Addendum)
05/28/18 patient is oriented only to person and confabulates No behavioral issues

## 2018-05-29 LAB — BASIC METABOLIC PANEL
BUN: 16 (ref 4–21)
Creatinine: 0.7 (ref 0.5–1.1)
Glucose: 104
POTASSIUM: 4.2 (ref 3.4–5.3)
SODIUM: 142 (ref 137–147)

## 2018-05-29 LAB — CBC AND DIFFERENTIAL
HEMATOCRIT: 34 — AB (ref 36–46)
HEMOGLOBIN: 10.7 — AB (ref 12.0–16.0)
NEUTROS ABS: 10
Platelets: 390 (ref 150–399)
WBC: 11.3

## 2018-08-20 ENCOUNTER — Non-Acute Institutional Stay (SKILLED_NURSING_FACILITY): Payer: Medicare Other

## 2018-08-20 DIAGNOSIS — Z0000 Encounter for general adult medical examination without abnormal findings: Secondary | ICD-10-CM | POA: Diagnosis not present

## 2018-08-20 NOTE — Progress Notes (Signed)
Subjective:   [name] is a 84 y.o. female who presents for Medicare Annual (Subsequent) preventive examination at [facility] term SNF  Last AWV-08/14/2017    Objective:     Vitals: BP 125/63 (BP Location: Left Arm, Patient Position: Sitting)   Pulse 75   Temp 98.3 F (36.8 C) (Oral)   Ht 5\' 3"  (1.6 m)   Wt 112 lb (50.8 kg)   BMI 19.84 kg/m   Body mass index is 19.84 kg/m.  Advanced Directives 08/20/2018 05/28/2018 03/19/2018 11/20/2017 08/20/2017 08/14/2017 11/05/2016  Does Patient Have a Medical Advance Directive? Yes Yes Yes Yes Yes Yes Yes  Type of Advance Directive Out of facility DNR (pink MOST or yellow form) Out of facility DNR (pink MOST or yellow form) Out of facility DNR (pink MOST or yellow form) Out of facility DNR (pink MOST or yellow form) Out of facility DNR (pink MOST or yellow form) Out of facility DNR (pink MOST or yellow form) Out of facility DNR (pink MOST or yellow form)  Does patient want to make changes to medical advance directive? No - Patient declined No - Patient declined No - Patient declined No - Patient declined No - Patient declined No - Patient declined No - Patient declined  Copy of Healthcare Power of Attorney in Chart? - - - - - - Yes  Would patient like information on creating a medical advance directive? - - - - - - -  Pre-existing out of facility DNR order (yellow form or pink MOST form) Yellow form placed in chart (order not valid for inpatient use) - - - - Yellow form placed in chart (order not valid for inpatient use);Pink MOST form placed in chart (order not valid for inpatient use) -    Tobacco Social History   Tobacco Use  Smoking Status Former Smoker  . Packs/day: 0.50  . Types: Cigarettes  . Last attempt to quit: 04/06/2012  . Years since quitting: 6.3  Smokeless Tobacco Never Used  Tobacco Comment   hx unreliable due to dementia, possibly 60 pack years     Counseling given: Not Answered Comment: hx unreliable due to  dementia, possibly 60 pack years   Clinical Intake:  Pre-visit preparation completed: No  Pain : No/denies pain     Nutritional Risks: None Diabetes: No  How often do you need to have someone help you when you read instructions, pamphlets, or other written materials from your doctor or pharmacy?: 3 - Sometimes What is the last grade level you completed in school?: High school  Interpreter Needed?: No  Information entered by :: Tyron Russell, RN  Past Medical History:  Diagnosis Date  . Altered mental status 01/01/2011   Qualifier: Diagnosis of  By: Earnest Bailey MD, Selena Batten    . Anemia   . Anxiety 03/09/2015  . Arthritis   . Asthma   . CAD (coronary artery disease)    a. reports prior h/o MI's with possible cath @ [facility] in [place] 'many years ago.'  . CAROTID ARTERY DISEASE 07/16/2007   a. 07/2012 U/S: no signif extracranial carotid artery stenosis, antegrad vertebral flow.  . CKD (chronic kidney disease), stage II   . COPD with asthma (HCC)   . CVA (cerebral vascular accident) (HCC)    a. She's not sure that this is true.  [name] DEFICIENCY, VITAMIN D NOS 07/16/2007   Qualifier: Diagnosis of  By: Irving Burton MD, Clifton Custard    . Diabetes mellitus, type 2 (HCC)   .  Diastolic CHF, chronic (HCC)    a. 07/2013 Echo: EF 55-60%.  . Fibromyalgia   . H/O hiatal hernia   . Headache(784.0)   . High risk social situations 11/28/2011  . HLD (hyperlipidemia)   . HTN (hypertension)   . Hyperthyroidism   . Intention tremor 03/03/2007   Qualifier: Diagnosis of  By: Dorathy DaftMORONO-PONCE  MD, IDAYLIS    . Neuromuscular disorder (HCC)   . Palpitations    a. 8.2014 Echo: EF 55-60%, no rwma, mod LVH, mildly dil LA, PASP 33mmHg.  [name]. Recurrent upper respiratory infection (URI)   . Seizures (HCC)   . Syncope, near 02/28/2012  . Systolic murmur    a. no significant valvular abnormalities on echo 8.2014.  . TOBACCO USER   . Urinary incontinence   . Vitamin D deficiency    Past Surgical History:  Procedure Laterality  Date  . APPENDECTOMY  1965  . KNEE SURGERY  1989, 1995  . VAGINAL HYSTERECTOMY  1965   Family History  Problem Relation Age of Onset  . Diabetes Mother   . Diabetes Brother   . Stroke Father    Social History   Socioeconomic History  . Marital status: Widowed    Spouse name: Not on file  . Number of children: Not on file  . Years of education: Not on file  . Highest education level: Not on file  Occupational History  . Occupation: Retired    Associate Professormployer: RETIRED    Comment: from [employer]  Social Needs  . Financial resource strain: Not hard at all  . Food insecurity:    Worry: Never true    Inability: Never true  . Transportation needs:    Medical: No    Non-medical: No  Tobacco Use  . Smoking status: Former Smoker    Packs/day: 0.50    Types: Cigarettes    Last attempt to quit: 04/06/2012    Years since quitting: 6.3  . Smokeless tobacco: Never Used  . Tobacco comment: hx unreliable due to dementia, possibly 60 pack years  Substance and Sexual Activity  . Alcohol use: No  . Drug use: No  . Sexual activity: Never  Lifestyle  . Physical activity:    Days per week: 0 days    Minutes per session: 0 min  . Stress: Not at all  Relationships  . Social connections:    Talks on phone: Never    Gets together: Never    Attends religious service: Never    Active member of club or organization: No    Attends meetings of clubs or organizations: Never    Relationship status: Widowed  Other Topics Concern  . Not on file  Social History Narrative   [facility] resident [facility]. Widowed.  Does not have anyone she can really trust. Lives off social security, retired from [employer] in 1988.         Does not have a POA if anything should happen to her. Has 2 nieces, 2 nephews that live far away. (Only one nephew "knows that I am alive")      Organ donor/donating body to science.       Diet consists mostly of greens and veggies. Little  meat, must force herself to eat meat.       Outpatient Encounter Medications as of 08/20/2018  Medication Sig  . acetaminophen (TYLENOL) 325 MG tablet Take 650 mg by mouth 3 (three) times daily.   [name]. aspirin 81 MG tablet  Take 81 mg by mouth daily.   . barrier cream (NON-SPECIFIED) CREA Apply 1 application topically 2 (two) times daily. Apply to inner buttocks  . budesonide (PULMICORT) 0.25 MG/2ML nebulizer solution Take 0.25 mg by nebulization 2 (two) times daily.  . Cholecalciferol 50000 units TABS Take 50,000 Units by mouth every 30 (thirty) days.  [name] diltiazem (CARDIZEM) 90 MG tablet Take 90 mg by mouth 2 (two) times daily.  [name] donepezil (ARICEPT) 10 MG tablet Take 10 mg by mouth at bedtime.   . ferrous sulfate 325 (65 FE) MG tablet Take 325 mg by mouth daily with breakfast.   . furosemide (LASIX) 20 MG tablet Take 20 mg by mouth every other day.   . ipratropium-albuterol (DUONEB) 0.5-2.5 (3) MG/3ML SOLN Take 3 mLs by nebulization every 4 (four) hours as needed.  . Melatonin 3 MG CAPS Take 3 mg by mouth at bedtime.  . metoprolol (LOPRESSOR) 50 MG tablet Take 50 mg by mouth 2 (two) times daily.   . montelukast (SINGULAIR) 10 MG tablet Take 10 mg by mouth daily.  . nitroGLYCERIN (NITROSTAT) 0.4 MG SL tablet Place 0.4 mg under the tongue every 5 (five) minutes as needed for chest pain.  . NON FORMULARY NSA Med Pass 120 mL by mouth two times daily  . Olopatadine HCl (PATADAY) 0.2 % SOLN Apply 1 drop to eye daily. Both eyes for ocular allergies  . polyethylene glycol (MIRALAX / GLYCOLAX) packet Take 17 g by mouth daily.  [name] Propylene Glycol (SYSTANE BALANCE) 0.6 % SOLN Place 1 drop into both eyes 2 (two) times daily.  . sertraline (ZOLOFT) 50 MG tablet Take 50 mg by mouth daily.  [name] UNABLE TO FIND Med Name: Magic cup twice daily   . UNABLE TO FIND Apply 1 application topically 3 (three) times daily. Med Name: Ativan gel 0.25 mg TID  . vitamin B-12 (CYANOCOBALAMIN) 1000 MCG tablet Take 1,000 mcg by  mouth every other day.   . [DISCONTINUED] albuterol (PROVENTIL) (2.5 MG/3ML) 0.083% nebulizer solution Take 3 mLs (2.5 mg total) by nebulization every 6 (six) hours as needed for wheezing.   No facility-administered encounter medications on file as of 08/20/2018.     Activities of Daily Living In your present state of health, do you have any difficulty performing the following activities: 08/20/2018  Hearing? N  Vision? N  Difficulty concentrating or making decisions? Y  Walking or climbing stairs? Y  Dressing or bathing? Y  Doing errands, shopping? Y  Preparing Food and eating ? Y  Using the Toilet? Y  In the past six months, have you accidently leaked urine? Y  Do you have problems with loss of bowel control? Y  Managing your Medications? Y  Managing your Finances? Y  Housekeeping or managing your Housekeeping? Y  Some recent data might be hidden    Patient Care Team: Pecola Lawless, MD as PCP - General (Internal Medicine)    Assessment:   This is a routine wellness examination for [place].  Exercise Activities and Dietary recommendations Current Exercise Habits: The patient does not participate in regular exercise at present, Exercise limited by: orthopedic condition(s)  Goals   None     Fall Risk Fall Risk  08/20/2018 08/14/2017 09/24/2012 09/21/2012 03/19/2012  Falls in the past year? No No - - -  Risk for fall due to : - - Impaired balance/gait;Impaired mobility Impaired mobility History of fall(s);Impaired balance/gait;Impaired mobility  Risk for fall due to: Comment - - - Using cane -  Is the patient's home free of loose throw rugs in walkways, pet beds, electrical cords, etc?   yes      Grab bars in the bathroom? yes      Handrails on the stairs?   yes      Adequate lighting?   yes  Depression Screen PHQ 2/9 Scores 08/20/2018 08/14/2017 03/19/2012  PHQ - 2 Score 0 0 0     Cognitive Function MMSE - Mini Mental State Exam 09/24/2012 03/19/2012  Orientation to  time 4 4  Orientation to Place 5 5  Registration 3 3  Attention/ Calculation 5 5  Recall 1 0  Language- name 2 objects 2 2  Language- repeat 1 1  Language- follow 3 step command 3 3  Language- read & follow direction 1 1  Write a sentence 1 1  Copy design 0 1  Total score 26 26     6CIT Screen 08/20/2018 08/14/2017  What Year? 4 points 4 points  What month? 3 points 3 points  What time? 0 points 0 points  Count back from 20 4 points 0 points  Months in reverse 4 points 4 points  Repeat phrase 10 points 10 points  Total Score 25 21    Immunization History  Administered Date(s) Administered  . Influenza-Unspecified 10/04/2015, 09/19/2016, 10/05/2017  . PPD Test 07/15/2014  . Pneumococcal Polysaccharide-23 09/22/1994  . Pneumococcal-Unspecified 08/05/2014, 09/19/2016  . Td 05/23/2004  . Tdap 02/25/2015    Qualifies for Shingles Vaccine? Not in past records  Screening Tests Health Maintenance  Topic Date Due  . FOOT EXAM  07/11/2018  . OPHTHALMOLOGY EXAM  07/17/2018  . INFLUENZA VACCINE  07/23/2018  . HEMOGLOBIN A1C  08/11/2018  . URINE MICROALBUMIN  08/16/2018  . PNA vac Low Risk Adult (2 of 2 - PCV13) 03/20/2019 (Originally 09/19/2017)  . TETANUS/TDAP  02/24/2025  . DEXA SCAN  Completed    Cancer Screenings: Lung: Low Dose CT Chest recommended if Age 106-80 years, 30 pack-year currently smoking OR have quit w/in 15years. Patient does not qualify. Breast:  Up to date on Mammogram? Yes   Up to date of Bone Density/Dexa? Yes Colorectal: up to date  Additional Screenings:  Hepatitis C Screening: declined Flu vaccine due: will receive at [facility]:    I have personally reviewed and addressed the Medicare Annual Wellness questionnaire and have noted the following in the patient's chart:  A. Medical and social history B. Use of alcohol, tobacco or illicit drugs  C. Current medications and supplements D. Functional ability and status E.  Nutritional  status F.  Physical activity G. Advance directives H. List of other physicians I.  Hospitalizations, surgeries, and ER visits in previous 12 months J.  Vitals K. Screenings to include hearing, vision, cognitive, depression L. Referrals and appointments - none  In addition, I have reviewed and discussed with patient certain preventive protocols, quality metrics, and best practice recommendations. A written personalized care plan for preventive services as well as general preventive health recommendations were provided to patient.  See attached scanned questionnaire for additional information.   Signed,   Tyron Russell, RN Nurse Health Advisor  Patient concerns: None

## 2018-08-20 NOTE — Patient Instructions (Signed)
[name] , Thank you for taking time to come for your Medicare Wellness Visit. I appreciate your ongoing commitment to your health goals. Please review the following plan we discussed and let me know if I can assist you in the future.   Screening recommendations/referrals: Colonoscopy excluded, over age 82 Mammogram excluded, over age 82 Bone Density up to date Recommended yearly ophthalmology/optometry visit for glaucoma screening and checkup Recommended yearly dental visit for hygiene and checkup  Vaccinations: Influenza vaccine due, [facility] Pneumococcal vaccine up to date, completed Tdap vaccine up to date, due 02/24/2025 Shingles vaccine not in past records    Advanced directives: in chart  Conditions/risks identified: none  Next appointment: Dr. Alwyn RenHopper makes rounds   Preventive Care 65 Years and Older, Female Preventive care refers to lifestyle choices and visits with your health care provider that can promote health and wellness. What does preventive care include?  A yearly physical exam. This is also called an annual well check.  Dental exams once or twice a year.  Routine eye exams. Ask your health care provider how often you should have your eyes checked.  Personal lifestyle choices, including:  Daily care of your teeth and gums.  Regular physical activity.  Eating a healthy diet.  Avoiding tobacco and drug use.  Limiting alcohol use.  Practicing safe sex.  Taking low-dose aspirin every day.  Taking vitamin and mineral supplements as recommended by your health care provider. What happens during an annual well check? The services and screenings done by your health care provider during your annual well check will depend on your age, overall health, lifestyle risk factors, and family history of disease. Counseling  Your health care provider may ask you questions about your:  Alcohol use.  Tobacco use.  Drug use.  Emotional  well-being.  Home and relationship well-being.  Sexual activity.  Eating habits.  History of falls.  Memory and ability to understand (cognition).  Work and work Astronomerenvironment.  Reproductive health. Screening  You may have the following tests or measurements:  Height, weight, and BMI.  Blood pressure.  Lipid and cholesterol levels. These may be checked every 5 years, or more frequently if you are over 82 years old.  Skin check.  Lung cancer screening. You may have this screening every year starting at age 82 if you have a 30-pack-year history of smoking and currently smoke or have quit within the past 15 years.  Fecal occult blood test (FOBT) of the stool. You may have this test every year starting at age 82.  Flexible sigmoidoscopy or colonoscopy. You may have a sigmoidoscopy every 5 years or a colonoscopy every 10 years starting at age 82.  Hepatitis C blood test.  Hepatitis B blood test.  Sexually transmitted disease (STD) testing.  Diabetes screening. This is done by checking your blood sugar (glucose) after you have not eaten for a while (fasting). You may have this done every 1-3 years.  Bone density scan. This is done to screen for osteoporosis. You may have this done starting at age 82.  Mammogram. This may be done every 1-2 years. Talk to your health care provider about how often you should have regular mammograms. Talk with your health care provider about your test results, treatment options, and if necessary, the need for more tests. Vaccines  Your health care provider may recommend certain vaccines, such as:  Influenza vaccine. This is recommended every year.  Tetanus, diphtheria, and acellular pertussis (Tdap, Td) vaccine.  You may need a Td booster every 10 years.  Zoster vaccine. You may need this after age 22.  Pneumococcal 13-valent conjugate (PCV13) vaccine. One dose is recommended after age 76.  Pneumococcal polysaccharide (PPSV23) vaccine. One  dose is recommended after age 69. Talk to your health care provider about which screenings and vaccines you need and how often you need them. This information is not intended to replace advice given to you by your health care provider. Make sure you discuss any questions you have with your health care provider. Document Released: 01/05/2016 Document Revised: 08/28/2016 Document Reviewed: 10/10/2015 Elsevier Interactive Patient Education  2017 [place] Prevention in the Home Falls can cause injuries. They can happen to people of all ages. There are many things you can do to make your home safe and to help prevent falls. What can I do on the outside of my home?  Regularly fix the edges of walkways and driveways and fix any cracks.  Remove anything that might make you trip as you walk through a door, such as a raised step or threshold.  Trim any bushes or trees on the path to your home.  Use bright outdoor lighting.  Clear any walking paths of anything that might make someone trip, such as rocks or tools.  Regularly check to see if handrails are loose or broken. Make sure that both sides of any steps have handrails.  Any raised decks and porches should have guardrails on the edges.  Have any leaves, snow, or ice cleared regularly.  Use sand or salt on walking paths during winter.  Clean up any spills in your garage right away. This includes oil or grease spills. What can I do in the bathroom?  Use night lights.  Install grab bars by the toilet and in the tub and shower. Do not use towel bars as grab bars.  Use non-skid mats or decals in the tub or shower.  If you need to sit down in the shower, use a plastic, non-slip stool.  Keep the floor dry. Clean up any water that spills on the floor as soon as it happens.  Remove soap buildup in the tub or shower regularly.  Attach bath mats securely with double-sided non-slip rug tape.  Do not have throw rugs and other  things on the floor that can make you trip. What can I do in the bedroom?  Use night lights.  Make sure that you have a light by your bed that is easy to reach.  Do not use any sheets or blankets that are too big for your bed. They should not hang down onto the floor.  Have a firm chair that has side arms. You can use this for support while you get dressed.  Do not have throw rugs and other things on the floor that can make you trip. What can I do in the kitchen?  Clean up any spills right away.  Avoid walking on wet floors.  Keep items that you use a lot in easy-to-reach places.  If you need to reach something above you, use a strong step stool that has a grab bar.  Keep electrical cords out of the way.  Do not use floor polish or wax that makes floors slippery. If you must use wax, use non-skid floor wax.  Do not have throw rugs and other things on the floor that can make you trip. What can I do with my stairs?  Do not leave any  items on the stairs.  Make sure that there are handrails on both sides of the stairs and use them. Fix handrails that are broken or loose. Make sure that handrails are as long as the stairways.  Check any carpeting to make sure that it is firmly attached to the stairs. Fix any carpet that is loose or worn.  Avoid having throw rugs at the top or bottom of the stairs. If you do have throw rugs, attach them to the floor with carpet tape.  Make sure that you have a light switch at the top of the stairs and the bottom of the stairs. If you do not have them, ask someone to add them for you. What else can I do to help prevent falls?  Wear shoes that:  Do not have high heels.  Have rubber bottoms.  Are comfortable and fit you well.  Are closed at the toe. Do not wear sandals.  If you use a stepladder:  Make sure that it is fully opened. Do not climb a closed stepladder.  Make sure that both sides of the stepladder are locked into place.  Ask  someone to hold it for you, if possible.  Clearly mark and make sure that you can see:  Any grab bars or handrails.  First and last steps.  Where the edge of each step is.  Use tools that help you move around (mobility aids) if they are needed. These include:  Canes.  Walkers.  Scooters.  Crutches.  Turn on the lights when you go into a dark area. Replace any light bulbs as soon as they burn out.  Set up your furniture so you have a clear path. Avoid moving your furniture around.  If any of your floors are uneven, fix them.  If there are any pets around you, be aware of where they are.  Review your medicines with your doctor. Some medicines can make you feel dizzy. This can increase your chance of falling. Ask your doctor what other things that you can do to help prevent falls. This information is not intended to replace advice given to you by your health care provider. Make sure you discuss any questions you have with your health care provider. Document Released: 10/05/2009 Document Revised: 05/16/2016 Document Reviewed: 01/13/2015 Elsevier Interactive Patient Education  2017 [employer].

## 2018-08-26 LAB — HM DIABETES EYE EXAM

## 2018-08-27 ENCOUNTER — Encounter: Payer: Self-pay | Admitting: Internal Medicine

## 2018-08-27 ENCOUNTER — Non-Acute Institutional Stay (SKILLED_NURSING_FACILITY): Payer: Medicare Other | Admitting: Internal Medicine

## 2018-08-27 DIAGNOSIS — R7303 Prediabetes: Secondary | ICD-10-CM

## 2018-08-27 DIAGNOSIS — I10 Essential (primary) hypertension: Secondary | ICD-10-CM

## 2018-08-27 DIAGNOSIS — F0150 Vascular dementia without behavioral disturbance: Secondary | ICD-10-CM | POA: Diagnosis not present

## 2018-08-27 DIAGNOSIS — D509 Iron deficiency anemia, unspecified: Secondary | ICD-10-CM

## 2018-08-27 NOTE — Assessment & Plan Note (Signed)
BP controlled; no change in antihypertensive medications  

## 2018-08-27 NOTE — Progress Notes (Signed)
   NURSING HOME LOCATION:  [facility] ROOM NUMBER:  [id number]  CODE STATUS:  DNR  PCP:  Pecola Lawless, MD  [address] [place] [address]  This is a nursing facility follow of chronic medical diagnoses.  Interim medical record and care since last [facility] visit was updated with review of diagnostic studies and change in clinical status since last visit were documented.  HPI: Permanent resident of the facility with medical diagnoses of essential hypertension, stroke, coronary artery disease, chronic diastolic heart failure, PSVT, chronic asthmatic bronchitis, iron deficiency anemia, history of hyperthyroidism and dyslipidemia. Psychiatry nurse practitioner is following the patient on a regular basis.  She was last seen 08/04/2018.  Alzheimer's dementia with delusions, major depressive disorder, insomnia, and anxiety state are being treated with sertraline 50 mg daily, melatonin 3 mg nightly, Aricept 10 mg nightly, and Ativan gel 0.25 mg 3 times daily.  Psych NP plans to assess the need to start Namenda for the confusion and memory/cognitive impairment. Mild hyperglycemia but serial A1c's have been prediabetic at 6.2%.  Her mild anemia has progressed slightly.  From 05/13/18 to 6/7 hemoglobin dropped from 11.8 down to 10.7 and hematocrit from 36-34.  No bleeding dyscrasias have been reported  Review of systems: Dementia invalidated responses.  She could not provide the date.  Asked if she had any symptoms her reply was "have to think about that" only concern was "nothing to do here". She denied any bleeding dyscrasias despite the drop in hemoglobin/ hematocrit.  She denied cold intolerance despite wearing a down jacket over her clothes.  Physical exam:  Pertinent or positive findings: Thin & suboptimally nourished. She has slight exotropia of the left eye.  She has complete dentures.  Heart sounds are distant.  She has a grade 1 blowing systolic murmur.  She has diffuse  low-grade rhonchi without increased work of breathing. Pedal pulses are decreased, especially the dorsalis pedis pulses.  Commands followed poorly and strength could not be assessed.  General appearance: no acute distress, increased work of breathing is present.   Lymphatic: No lymphadenopathy about the head, neck, axilla. Eyes: No conjunctival inflammation or lid edema is present. There is no scleral icterus. Ears:  External ear exam shows no significant lesions or deformities.   Nose:  External nasal examination shows no deformity or inflammation. Nasal mucosa are pink and moist without lesions, exudates Oral exam:  Lips and gums are healthy appearing. There is no oropharyngeal erythema or exudate. Neck:  No thyromegaly, masses, tenderness noted.    Heart:  Normal rate and regular rhythm. S1 and S2 normal without gallop,  click, rub .  Abdomen: Bowel sounds are normal. Abdomen is soft and nontender with no organomegaly, hernias, masses. GU: Deferred  Extremities:  No cyanosis, clubbing, edema  Skin: Warm & dry w/o tenting. No significant lesions or rash.  See summary under each active problem in the Problem List with associated updated therapeutic plan

## 2018-08-27 NOTE — Assessment & Plan Note (Signed)
Serial A1cs  are prediabetic and she exhibits no significant hyperglycemia

## 2018-08-27 NOTE — Assessment & Plan Note (Addendum)
No behavioral issues reported Psych NP to continue to follow

## 2018-08-27 NOTE — Assessment & Plan Note (Signed)
Drop in hemoglobin and hematocrit will be evaluated by repeat CBC. Staff will be asked to monitor for melena or rectal bleeding

## 2018-08-28 ENCOUNTER — Encounter: Payer: Self-pay | Admitting: Internal Medicine

## 2018-08-28 NOTE — Patient Instructions (Signed)
See assessment and plan under each diagnosis in the problem list and acutely for this visit 

## 2018-09-01 LAB — BASIC METABOLIC PANEL
BUN: 13 (ref 4–21)
CREATININE: 0.8 (ref 0.5–1.1)
Glucose: 73
Potassium: 4 (ref 3.4–5.3)
Sodium: 142 (ref 137–147)

## 2018-09-01 LAB — CBC AND DIFFERENTIAL
HEMATOCRIT: 40 (ref 36–46)
HEMOGLOBIN: 12.9 (ref 12.0–16.0)
NEUTROS ABS: 6
PLATELETS: 299 (ref 150–399)
WBC: 7.7

## 2018-09-02 ENCOUNTER — Encounter: Payer: Self-pay | Admitting: *Deleted

## 2018-09-03 ENCOUNTER — Encounter: Payer: Self-pay | Admitting: Internal Medicine

## 2018-09-03 DIAGNOSIS — H31009 Unspecified chorioretinal scars, unspecified eye: Secondary | ICD-10-CM | POA: Insufficient documentation

## 2018-09-07 ENCOUNTER — Non-Acute Institutional Stay (SKILLED_NURSING_FACILITY): Payer: Medicare Other | Admitting: Internal Medicine

## 2018-09-07 ENCOUNTER — Encounter: Payer: Self-pay | Admitting: Internal Medicine

## 2018-09-07 DIAGNOSIS — B9629 Other Escherichia coli [E. coli] as the cause of diseases classified elsewhere: Secondary | ICD-10-CM

## 2018-09-07 DIAGNOSIS — N390 Urinary tract infection, site not specified: Secondary | ICD-10-CM | POA: Diagnosis not present

## 2018-09-07 DIAGNOSIS — R4182 Altered mental status, unspecified: Secondary | ICD-10-CM

## 2018-09-07 DIAGNOSIS — Z1612 Extended spectrum beta lactamase (ESBL) resistance: Secondary | ICD-10-CM | POA: Diagnosis not present

## 2018-09-07 NOTE — Progress Notes (Signed)
    NURSING HOME LOCATION:  [facility] ROOM NUMBER:  [id number]  CODE STATUS:  DNR  PCP:  Pecola LawlessHopper, July Nickson F, MD  [address] [place] [id number]  This is a nursing facility follow up for specific acute issue of UTI.  Interim medical record and care since last [facility] visit was updated with review of diagnostic studies and change in clinical status since last visit were documented.  HPI:The [facility] NP had seen the patient because of altered mental status.  Acute CVA versus TIA was questioned.  Urine was collected to rule out UTI as a cause of mental status changes.  Empirically she was placed on Rocephin pending final C&S.  The report reveals E. coli with resistance to all antibiotics except Ertapenem,Imipenem,Piperacillin/Tazobactam, and Tobramycin.  E coli is ESBL positive. Pincus Sanesnvanz was initiated 9/13 for 5-day course.  Review of systems: Dementia invalidated responses.  She denies any genitourinary or constitutional symptoms.  In fact she states she has no symptoms at all.  She denies being in the nursing facility, stating "this is the first time I've been here".  She asked where this building is located. She loudly disagreed with my answer of [place].  She said she had never been there.   Physical exam:  Pertinent or positive findings: She exhibits alternating exotropia.  She has complete dentures.  Grade 1/2-1 systolic murmur is present.  Breath sounds are decreased.  Pedal pulses are decreased.  Limbs are thin but strength is surprisingly good and symmetric.  She has good range of motion of all 4 extremities for her age and condition. There is a flexion contraction of the fifth left digit.  She has diffuse seborrheic keratoses over the posterior thorax.  General appearance:  no acute distress, increased work of breathing is present.   Lymphatic: No lymphadenopathy about the head, neck, axilla. Eyes: No conjunctival inflammation or lid edema is present. There is no scleral  icterus. Ears:  External ear exam shows no significant lesions or deformities.   Nose:  External nasal examination shows no deformity or inflammation. Nasal mucosa are pink and moist without lesions, exudates Oral exam:  Lips and gums are healthy appearing. There is no oropharyngeal erythema or exudate. Neck:  No thyromegaly, masses, tenderness noted.    Heart:  Normal rate and regular rhythm. S1 and S2 normal without gallop, click, rub .  Lungs:  without wheezes, rhonchi, rales, rubs. Abdomen: Bowel sounds are normal. Abdomen is soft and nontender with no organomegaly, hernias, masses. GU: Deferred  Extremities:  No cyanosis, clubbing, edema  Neurologic exam : Balance, Rhomberg, finger to nose testing could not be completed due to clinical state Skin: Warm & dry w/o tenting. No significant  rash.  See summary under each active problem in the Problem List with associated updated therapeutic plan

## 2018-09-07 NOTE — Patient Instructions (Signed)
See assessment and plan under each diagnosis in the problem list and acutely for this visit 

## 2018-09-09 ENCOUNTER — Encounter: Payer: Self-pay | Admitting: Internal Medicine

## 2018-12-03 ENCOUNTER — Encounter: Payer: Self-pay | Admitting: Internal Medicine

## 2018-12-03 ENCOUNTER — Non-Acute Institutional Stay (SKILLED_NURSING_FACILITY): Payer: Medicare Other | Admitting: Internal Medicine

## 2018-12-03 DIAGNOSIS — F0150 Vascular dementia without behavioral disturbance: Secondary | ICD-10-CM | POA: Diagnosis not present

## 2018-12-03 DIAGNOSIS — J449 Chronic obstructive pulmonary disease, unspecified: Secondary | ICD-10-CM | POA: Diagnosis not present

## 2018-12-03 DIAGNOSIS — I10 Essential (primary) hypertension: Secondary | ICD-10-CM | POA: Diagnosis not present

## 2018-12-03 DIAGNOSIS — D509 Iron deficiency anemia, unspecified: Secondary | ICD-10-CM | POA: Diagnosis not present

## 2018-12-03 DIAGNOSIS — J4489 Other specified chronic obstructive pulmonary disease: Secondary | ICD-10-CM

## 2018-12-03 NOTE — Assessment & Plan Note (Signed)
BP controlled; no change in antihypertensive medications  

## 2018-12-03 NOTE — Assessment & Plan Note (Signed)
Severe dementia present.  It is unlikely that Aricept is of significant benefit but discontinuing medication may cause destabilization When seen 12/03/2018 she was very lethargic; there are no medications on the med list which should be exacerbating this

## 2018-12-03 NOTE — Assessment & Plan Note (Signed)
Anemia had resolved when last checked in September.  No bleeding dyscrasias have been reported.  CBC could be rechecked in March 2020 or as indicated by any change in clinical status.

## 2018-12-03 NOTE — Progress Notes (Signed)
    NURSING HOME LOCATION:  [facility] ROOM NUMBER:  114-B  CODE STATUS:  DNR  PCP:  Pecola LawlessHopper, Ha Shannahan F, MD  [address] [place] [id number]  This is a nursing facility follow up of chronic medical diagnoses.  Interim medical record and care since [facility] was updated with review of diagnostic studies and change in clinical status since last visit were documented.  HPI: The patient has been a permanent resident of this facility since 01/10/2015.  Medical diagnoses include PSVT, chronic asthmatic bronchitis, iron deficiency anemia, history of hypothyroidism, dyslipidemia, chronic diastolic heart failure, coronary disease, history of stroke, and essential hypertension.  She is felt to have Alzheimer's dementia with delusions as well as major depressive disorder.  Psych NP has monitored her on a regular basis. Labs are sufficiently current.  There are no significant abnormalities.  Her anemia had resolved when last checked in September.  Review of systems: Dementia invalidated responses.  She is unable to provide even the year.  Her answers are truncated and nonsensical.  She will make statements such as "not what I expected".  She did validate a nonproductive cough and intermittent shortness of breath.   Physical exam:  Pertinent or positive findings: She appears her stated age.  She sits in a wheelchair sleeping.  She is overdressed for the ambient temperature of the environment, wearing a hat, sweater, and a wool jacket.  She cannot stay engaged and drifts away mentally.  She denied any visual issues but could not count fingers.  There was slight exotropia of the left eye.  She clinically appeared to have difficulty focusing.  Complete dentures are present.  She has low-grade scattered rhonchi.  The left lower extremity is weaker than the right.  There is symmetric weakness in the upper extremities to opposition.  She has scattered keratoses particularly over the  posterior thorax.  General appearance:  no acute distress, increased work of breathing is present.   Lymphatic: No lymphadenopathy about the head, neck, axilla. Eyes: No conjunctival inflammation or lid edema is present. There is no scleral icterus. Ears:  External ear exam shows no significant lesions or deformities.   Nose:  External nasal examination shows no deformity or inflammation. Nasal mucosa are pink and moist without lesions, exudates Oral exam:  Lips and gums are healthy appearing. There is no oropharyngeal erythema or exudate. Neck:  No thyromegaly, masses, tenderness noted.    Heart:  Normal rate and regular rhythm. S1 and S2 normal without gallop, murmur, click, rub .  Lungs:  without wheezes, rales, rubs. Abdomen: Bowel sounds are normal. Abdomen is soft and nontender with no organomegaly, hernias, masses. GU: Deferred  Extremities:  No cyanosis, clubbing, edema  Neurologic exam : Skin: Warm & dry w/o tenting. No significant lesions or rash.  See summary under each active problem in the Problem List with associated updated therapeutic plan

## 2018-12-03 NOTE — Assessment & Plan Note (Signed)
Continue pulmonary toilet 

## 2018-12-03 NOTE — Patient Instructions (Signed)
See assessment and plan under each diagnosis in the problem list and acutely for this visit 

## 2019-02-23 ENCOUNTER — Encounter: Payer: Self-pay | Admitting: Internal Medicine

## 2019-02-23 ENCOUNTER — Non-Acute Institutional Stay (SKILLED_NURSING_FACILITY): Payer: Medicare Other | Admitting: Internal Medicine

## 2019-02-23 DIAGNOSIS — I471 Supraventricular tachycardia: Secondary | ICD-10-CM | POA: Diagnosis not present

## 2019-02-23 DIAGNOSIS — J449 Chronic obstructive pulmonary disease, unspecified: Secondary | ICD-10-CM

## 2019-02-23 DIAGNOSIS — I5032 Chronic diastolic (congestive) heart failure: Secondary | ICD-10-CM | POA: Diagnosis not present

## 2019-02-23 DIAGNOSIS — I10 Essential (primary) hypertension: Secondary | ICD-10-CM | POA: Diagnosis not present

## 2019-02-23 DIAGNOSIS — R5383 Other fatigue: Secondary | ICD-10-CM

## 2019-02-23 NOTE — Assessment & Plan Note (Signed)
BP controlled; no change in antihypertensive medications  

## 2019-02-23 NOTE — Progress Notes (Signed)
   NURSING HOME LOCATION:  [facility] ROOM NUMBER: 114/B   CODE STATUS: DNR  PCP:  Pecola Lawless MD.  This is a nursing facility follow up of chronic medical diagnoses  Interim medical record and care since last [facility] visit was updated with review of diagnostic studies and change in clinical status since last visit were documented.  HPI: She has been a permanent resident of the facility since 2016.  Medical diagnoses include chronic diastolic congestive heart failure, CAD, history of stroke, essential hypertension, PSVT, asthmatic bronchitis, hypothyroidism, dyslipidemia, and iron deficiency anemia.  Other diagnoses include Alzheimer's dementia with delusions as well as depression.  Psych NP has monitored her. Most recent labs were in September 2019.  Her anemia had improved significantly.  She is on B12 thousand micrograms every other day.  Review of systems: Patient can provide no meaningful history.  She was markedly lethargic and all responses were slurred and almost unintelligible.  Physical exam:  Pertinent or positive findings: She is very lethargic.  She is also hard of hearing.  She is thin and appears suboptimally nourished.  Bilateral ptosis is present.  Hair is disheveled.  She has complete dentures.  Grade 1.5 systolic murmur is present loudest at the left base. There are occasional premature beats  with rare coupling. Breath sounds were decreased.  Typically she has expiratory rhonchi but none were auscultated at this time.  Limbs are atrophic.  Pedal pulses are decreased.  There is a suggestion of possible trace edema at the left ankle.  She is wearing an anti-wandering bracelet on the right ankle.  Severe clubbing of the nailbeds is noted.  There is a varus deformity of the knees with fusiform enlargement of the left knee without associated effusion.  General appearance: no acute distress, increased work of breathing is present.   Lymphatic: No  lymphadenopathy about the head, neck, axilla. Eyes: No conjunctival inflammation or lid edema is present. There is no scleral icterus. Ears:  External ear exam shows no significant lesions or deformities.   Nose:  External nasal examination shows no deformity or inflammation. Nasal mucosa are pink and moist without lesions, exudates Oral exam:  Lips and gums are healthy appearing. There is no oropharyngeal erythema or exudate. Neck:  No thyromegaly, masses, tenderness noted.    Heart:  No gallop, murmur, click, rub .  Lungs:  without wheezes, rhonchi, rales, rubs. Abdomen: Bowel sounds are normal. Abdomen is soft and nontender with no organomegaly, hernias, masses. GU: Deferred  Extremities:  No cyanosis  Neurologic exam : Balance, Rhomberg, finger to nose testing could not be completed due to clinical state Skin: Warm & dry w/o tenting. No significant lesions or rash.  See summary under each active problem in the Problem List with associated updated therapeutic plan

## 2019-02-23 NOTE — Patient Instructions (Signed)
See assessment and plan under each diagnosis in the problem list and acutely for this visit 

## 2019-02-25 LAB — TSH: TSH: 1.06 (ref 0.41–5.90)

## 2019-02-25 LAB — VITAMIN B12: Vitamin B-12: 711

## 2019-03-14 ENCOUNTER — Emergency Department (HOSPITAL_COMMUNITY): Payer: Medicare Other

## 2019-03-14 ENCOUNTER — Inpatient Hospital Stay (HOSPITAL_COMMUNITY)
Admission: EM | Admit: 2019-03-14 | Discharge: 2019-03-16 | DRG: 064 | Disposition: A | Discharge status: Skilled Nursing Facility | Payer: Medicare Other | Attending: Internal Medicine | Admitting: Internal Medicine

## 2019-03-14 ENCOUNTER — Other Ambulatory Visit: Payer: Self-pay

## 2019-03-14 DIAGNOSIS — I639 Cerebral infarction, unspecified: Principal | ICD-10-CM | POA: Diagnosis present

## 2019-03-14 DIAGNOSIS — R402212 Coma scale, best verbal response, none, at arrival to emergency department: Secondary | ICD-10-CM | POA: Diagnosis present

## 2019-03-14 DIAGNOSIS — Z7982 Long term (current) use of aspirin: Secondary | ICD-10-CM

## 2019-03-14 DIAGNOSIS — Z515 Encounter for palliative care: Secondary | ICD-10-CM | POA: Diagnosis present

## 2019-03-14 DIAGNOSIS — M1990 Unspecified osteoarthritis, unspecified site: Secondary | ICD-10-CM | POA: Diagnosis present

## 2019-03-14 DIAGNOSIS — R29722 NIHSS score 22: Secondary | ICD-10-CM | POA: Diagnosis present

## 2019-03-14 DIAGNOSIS — Z885 Allergy status to narcotic agent status: Secondary | ICD-10-CM

## 2019-03-14 DIAGNOSIS — E1151 Type 2 diabetes mellitus with diabetic peripheral angiopathy without gangrene: Secondary | ICD-10-CM | POA: Diagnosis present

## 2019-03-14 DIAGNOSIS — F0150 Vascular dementia without behavioral disturbance: Secondary | ICD-10-CM | POA: Diagnosis present

## 2019-03-14 DIAGNOSIS — R402112 Coma scale, eyes open, never, at arrival to emergency department: Secondary | ICD-10-CM | POA: Diagnosis present

## 2019-03-14 DIAGNOSIS — I2510 Atherosclerotic heart disease of native coronary artery without angina pectoris: Secondary | ICD-10-CM | POA: Diagnosis present

## 2019-03-14 DIAGNOSIS — J189 Pneumonia, unspecified organism: Secondary | ICD-10-CM

## 2019-03-14 DIAGNOSIS — I130 Hypertensive heart and chronic kidney disease with heart failure and stage 1 through stage 4 chronic kidney disease, or unspecified chronic kidney disease: Secondary | ICD-10-CM | POA: Diagnosis present

## 2019-03-14 DIAGNOSIS — R9401 Abnormal electroencephalogram [EEG]: Secondary | ICD-10-CM | POA: Diagnosis present

## 2019-03-14 DIAGNOSIS — N182 Chronic kidney disease, stage 2 (mild): Secondary | ICD-10-CM | POA: Diagnosis present

## 2019-03-14 DIAGNOSIS — I5032 Chronic diastolic (congestive) heart failure: Secondary | ICD-10-CM | POA: Diagnosis present

## 2019-03-14 DIAGNOSIS — E1122 Type 2 diabetes mellitus with diabetic chronic kidney disease: Secondary | ICD-10-CM | POA: Diagnosis present

## 2019-03-14 DIAGNOSIS — I252 Old myocardial infarction: Secondary | ICD-10-CM

## 2019-03-14 DIAGNOSIS — I10 Essential (primary) hypertension: Secondary | ICD-10-CM | POA: Diagnosis present

## 2019-03-14 DIAGNOSIS — R0902 Hypoxemia: Secondary | ICD-10-CM | POA: Diagnosis present

## 2019-03-14 DIAGNOSIS — F01518 Vascular dementia, unspecified severity, with other behavioral disturbance: Secondary | ICD-10-CM | POA: Diagnosis present

## 2019-03-14 DIAGNOSIS — Z79899 Other long term (current) drug therapy: Secondary | ICD-10-CM

## 2019-03-14 DIAGNOSIS — E0590 Thyrotoxicosis, unspecified without thyrotoxic crisis or storm: Secondary | ICD-10-CM | POA: Diagnosis present

## 2019-03-14 DIAGNOSIS — R402342 Coma scale, best motor response, flexion withdrawal, at arrival to emergency department: Secondary | ICD-10-CM | POA: Diagnosis present

## 2019-03-14 DIAGNOSIS — R569 Unspecified convulsions: Secondary | ICD-10-CM | POA: Diagnosis present

## 2019-03-14 DIAGNOSIS — J449 Chronic obstructive pulmonary disease, unspecified: Secondary | ICD-10-CM | POA: Diagnosis present

## 2019-03-14 DIAGNOSIS — F0151 Vascular dementia with behavioral disturbance: Secondary | ICD-10-CM | POA: Diagnosis present

## 2019-03-14 DIAGNOSIS — E785 Hyperlipidemia, unspecified: Secondary | ICD-10-CM | POA: Diagnosis present

## 2019-03-14 DIAGNOSIS — G9340 Encephalopathy, unspecified: Secondary | ICD-10-CM | POA: Diagnosis present

## 2019-03-14 DIAGNOSIS — Z880 Allergy status to penicillin: Secondary | ICD-10-CM

## 2019-03-14 DIAGNOSIS — J690 Pneumonitis due to inhalation of food and vomit: Secondary | ICD-10-CM | POA: Diagnosis present

## 2019-03-14 DIAGNOSIS — Z823 Family history of stroke: Secondary | ICD-10-CM

## 2019-03-14 DIAGNOSIS — Z8673 Personal history of transient ischemic attack (TIA), and cerebral infarction without residual deficits: Secondary | ICD-10-CM

## 2019-03-14 DIAGNOSIS — Z833 Family history of diabetes mellitus: Secondary | ICD-10-CM

## 2019-03-14 DIAGNOSIS — R011 Cardiac murmur, unspecified: Secondary | ICD-10-CM | POA: Diagnosis present

## 2019-03-14 DIAGNOSIS — F419 Anxiety disorder, unspecified: Secondary | ICD-10-CM | POA: Diagnosis present

## 2019-03-14 DIAGNOSIS — Z87891 Personal history of nicotine dependence: Secondary | ICD-10-CM

## 2019-03-14 DIAGNOSIS — Z66 Do not resuscitate: Secondary | ICD-10-CM | POA: Diagnosis present

## 2019-03-14 DIAGNOSIS — E559 Vitamin D deficiency, unspecified: Secondary | ICD-10-CM | POA: Diagnosis present

## 2019-03-14 DIAGNOSIS — M797 Fibromyalgia: Secondary | ICD-10-CM | POA: Diagnosis present

## 2019-03-14 DIAGNOSIS — Z7951 Long term (current) use of inhaled steroids: Secondary | ICD-10-CM

## 2019-03-14 LAB — I-STAT CREATININE, ED: Creatinine, Ser: 1.1 mg/dL — ABNORMAL HIGH (ref 0.44–1.00)

## 2019-03-14 NOTE — ED Triage Notes (Signed)
Found by nursing staff twitching and unresponsive, ems called

## 2019-03-14 NOTE — ED Notes (Addendum)
Pt arrived to the ED room sat 86%, ED provider [facility] bedside, placed on 2L O2 nasal cannula with no increase in oxygen saturation. Then proceeded to the non-rebreather mask at 10L that showed response. Pt currently saturating in the 98%. Will wean as pt tolerates.

## 2019-03-15 ENCOUNTER — Encounter (HOSPITAL_COMMUNITY): Payer: Self-pay | Admitting: *Deleted

## 2019-03-15 ENCOUNTER — Observation Stay (HOSPITAL_COMMUNITY): Payer: Medicare Other

## 2019-03-15 ENCOUNTER — Other Ambulatory Visit (HOSPITAL_COMMUNITY): Payer: Self-pay

## 2019-03-15 ENCOUNTER — Emergency Department (HOSPITAL_COMMUNITY): Payer: Medicare Other

## 2019-03-15 ENCOUNTER — Inpatient Hospital Stay (HOSPITAL_COMMUNITY): Payer: Medicare Other

## 2019-03-15 ENCOUNTER — Other Ambulatory Visit: Payer: Self-pay

## 2019-03-15 DIAGNOSIS — M1990 Unspecified osteoarthritis, unspecified site: Secondary | ICD-10-CM | POA: Diagnosis present

## 2019-03-15 DIAGNOSIS — I10 Essential (primary) hypertension: Secondary | ICD-10-CM | POA: Diagnosis not present

## 2019-03-15 DIAGNOSIS — E0590 Thyrotoxicosis, unspecified without thyrotoxic crisis or storm: Secondary | ICD-10-CM | POA: Diagnosis present

## 2019-03-15 DIAGNOSIS — I2510 Atherosclerotic heart disease of native coronary artery without angina pectoris: Secondary | ICD-10-CM

## 2019-03-15 DIAGNOSIS — F419 Anxiety disorder, unspecified: Secondary | ICD-10-CM | POA: Diagnosis present

## 2019-03-15 DIAGNOSIS — I639 Cerebral infarction, unspecified: Secondary | ICD-10-CM | POA: Diagnosis present

## 2019-03-15 DIAGNOSIS — I5032 Chronic diastolic (congestive) heart failure: Secondary | ICD-10-CM

## 2019-03-15 DIAGNOSIS — F0150 Vascular dementia without behavioral disturbance: Secondary | ICD-10-CM

## 2019-03-15 DIAGNOSIS — Z66 Do not resuscitate: Secondary | ICD-10-CM | POA: Diagnosis present

## 2019-03-15 DIAGNOSIS — R569 Unspecified convulsions: Secondary | ICD-10-CM

## 2019-03-15 DIAGNOSIS — J449 Chronic obstructive pulmonary disease, unspecified: Secondary | ICD-10-CM | POA: Diagnosis not present

## 2019-03-15 DIAGNOSIS — E785 Hyperlipidemia, unspecified: Secondary | ICD-10-CM | POA: Diagnosis present

## 2019-03-15 DIAGNOSIS — G9340 Encephalopathy, unspecified: Secondary | ICD-10-CM | POA: Diagnosis present

## 2019-03-15 DIAGNOSIS — J189 Pneumonia, unspecified organism: Secondary | ICD-10-CM

## 2019-03-15 DIAGNOSIS — N182 Chronic kidney disease, stage 2 (mild): Secondary | ICD-10-CM | POA: Diagnosis present

## 2019-03-15 DIAGNOSIS — E1122 Type 2 diabetes mellitus with diabetic chronic kidney disease: Secondary | ICD-10-CM | POA: Diagnosis present

## 2019-03-15 DIAGNOSIS — I130 Hypertensive heart and chronic kidney disease with heart failure and stage 1 through stage 4 chronic kidney disease, or unspecified chronic kidney disease: Secondary | ICD-10-CM | POA: Diagnosis present

## 2019-03-15 DIAGNOSIS — R0902 Hypoxemia: Secondary | ICD-10-CM | POA: Diagnosis present

## 2019-03-15 DIAGNOSIS — M797 Fibromyalgia: Secondary | ICD-10-CM | POA: Diagnosis present

## 2019-03-15 DIAGNOSIS — Z515 Encounter for palliative care: Secondary | ICD-10-CM | POA: Diagnosis present

## 2019-03-15 DIAGNOSIS — R402212 Coma scale, best verbal response, none, at arrival to emergency department: Secondary | ICD-10-CM | POA: Diagnosis present

## 2019-03-15 DIAGNOSIS — R402112 Coma scale, eyes open, never, at arrival to emergency department: Secondary | ICD-10-CM | POA: Diagnosis present

## 2019-03-15 DIAGNOSIS — E1151 Type 2 diabetes mellitus with diabetic peripheral angiopathy without gangrene: Secondary | ICD-10-CM | POA: Diagnosis present

## 2019-03-15 DIAGNOSIS — R402342 Coma scale, best motor response, flexion withdrawal, at arrival to emergency department: Secondary | ICD-10-CM | POA: Diagnosis present

## 2019-03-15 DIAGNOSIS — R9401 Abnormal electroencephalogram [EEG]: Secondary | ICD-10-CM | POA: Diagnosis present

## 2019-03-15 DIAGNOSIS — J690 Pneumonitis due to inhalation of food and vomit: Secondary | ICD-10-CM | POA: Diagnosis present

## 2019-03-15 LAB — BASIC METABOLIC PANEL
ANION GAP: 11 (ref 5–15)
BUN: 12 mg/dL (ref 8–23)
CO2: 26 mmol/L (ref 22–32)
Calcium: 8.9 mg/dL (ref 8.9–10.3)
Chloride: 105 mmol/L (ref 98–111)
Creatinine, Ser: 0.95 mg/dL (ref 0.44–1.00)
GFR calc Af Amer: 59 mL/min — ABNORMAL LOW (ref >60)
GFR, EST NON AFRICAN AMERICAN: 51 mL/min — AB (ref >60)
Glucose, Bld: 91 mg/dL (ref 70–99)
Potassium: 3.8 mmol/L (ref 3.5–5.1)
Sodium: 142 mmol/L (ref 135–145)

## 2019-03-15 LAB — URINALYSIS, ROUTINE W REFLEX MICROSCOPIC
Bilirubin Urine: NEGATIVE
Glucose, UA: NEGATIVE mg/dL
Hgb urine dipstick: NEGATIVE
Ketones, ur: NEGATIVE mg/dL
Leukocytes,Ua: NEGATIVE
Nitrite: NEGATIVE
PH: 5 (ref 5.0–8.0)
Protein, ur: 100 mg/dL — AB
Specific Gravity, Urine: 1.021 (ref 1.005–1.030)

## 2019-03-15 LAB — COMPREHENSIVE METABOLIC PANEL
ALT: 18 U/L (ref 0–44)
AST: 23 U/L (ref 15–41)
Albumin: 3.2 g/dL — ABNORMAL LOW (ref 3.5–5.0)
Alkaline Phosphatase: 76 U/L (ref 38–126)
Anion gap: 6 (ref 5–15)
BILIRUBIN TOTAL: 0.6 mg/dL (ref 0.3–1.2)
BUN: 15 mg/dL (ref 8–23)
CO2: 26 mmol/L (ref 22–32)
Calcium: 8.5 mg/dL — ABNORMAL LOW (ref 8.9–10.3)
Chloride: 106 mmol/L (ref 98–111)
Creatinine, Ser: 1.13 mg/dL — ABNORMAL HIGH (ref 0.44–1.00)
GFR calc Af Amer: 48 mL/min — ABNORMAL LOW (ref >60)
GFR calc non Af Amer: 41 mL/min — ABNORMAL LOW (ref >60)
Glucose, Bld: 161 mg/dL — ABNORMAL HIGH (ref 70–99)
POTASSIUM: 3.6 mmol/L (ref 3.5–5.1)
Sodium: 138 mmol/L (ref 135–145)
Total Protein: 6.9 g/dL (ref 6.5–8.1)

## 2019-03-15 LAB — APTT: aPTT: 24 seconds (ref 24–36)

## 2019-03-15 LAB — PROTIME-INR
INR: 1 (ref 0.8–1.2)
Prothrombin Time: 13.5 seconds (ref 11.4–15.2)

## 2019-03-15 LAB — MRSA PCR SCREENING: MRSA BY PCR: NEGATIVE

## 2019-03-15 LAB — RAPID URINE DRUG SCREEN, HOSP PERFORMED
Amphetamines: NOT DETECTED
BENZODIAZEPINES: POSITIVE — AB
Barbiturates: NOT DETECTED
Cocaine: NOT DETECTED
Opiates: NOT DETECTED
Tetrahydrocannabinol: NOT DETECTED

## 2019-03-15 LAB — DIFFERENTIAL
Abs Immature Granulocytes: 0.06 10*3/uL (ref 0.00–0.07)
Basophils Absolute: 0.1 10*3/uL (ref 0.0–0.1)
Basophils Relative: 0 %
EOS ABS: 0 10*3/uL (ref 0.0–0.5)
Eosinophils Relative: 0 %
Immature Granulocytes: 1 %
Lymphocytes Relative: 2 %
Lymphs Abs: 0.2 10*3/uL — ABNORMAL LOW (ref 0.7–4.0)
Monocytes Absolute: 0.9 10*3/uL (ref 0.1–1.0)
Monocytes Relative: 7 %
Neutro Abs: 10.6 10*3/uL — ABNORMAL HIGH (ref 1.7–7.7)
Neutrophils Relative %: 90 %

## 2019-03-15 LAB — CBC
HCT: 38.5 % (ref 36.0–46.0)
Hemoglobin: 12.4 g/dL (ref 12.0–15.0)
MCH: 26.6 pg (ref 26.0–34.0)
MCHC: 32.2 g/dL (ref 30.0–36.0)
MCV: 82.4 fL (ref 80.0–100.0)
NRBC: 0 % (ref 0.0–0.2)
Platelets: 303 10*3/uL (ref 150–400)
RBC: 4.67 MIL/uL (ref 3.87–5.11)
RDW: 17.2 % — ABNORMAL HIGH (ref 11.5–15.5)
WBC: 11.8 10*3/uL — ABNORMAL HIGH (ref 4.0–10.5)

## 2019-03-15 LAB — GLUCOSE, CAPILLARY: GLUCOSE-CAPILLARY: 102 mg/dL — AB (ref 70–99)

## 2019-03-15 LAB — MAGNESIUM: MAGNESIUM: 1.8 mg/dL (ref 1.7–2.4)

## 2019-03-15 LAB — ETHANOL: Alcohol, Ethyl (B): <10 mg/dL (ref <10)

## 2019-03-15 LAB — STREP PNEUMONIAE URINARY ANTIGEN: Strep Pneumo Urinary Antigen: NEGATIVE

## 2019-03-15 MED ORDER — SERTRALINE HCL 50 MG PO TABS: 50.0000 mg | ORAL_TABLET | Freq: Every day | ORAL | Status: DC

## 2019-03-15 MED ORDER — BUDESONIDE 0.25 MG/2ML IN SUSP
0.2500 mg | Freq: Two times a day (BID) | RESPIRATORY_TRACT | Status: DC
  Administered 2019-03-15: 0.25 mg via RESPIRATORY_TRACT
  Filled 2019-03-15 (×2): qty 2

## 2019-03-15 MED ORDER — ACETAMINOPHEN 325 MG PO TABS: 650.0000 mg | ORAL_TABLET | Freq: Three times a day (TID) | ORAL | Status: DC | PRN

## 2019-03-15 MED ORDER — ACETAMINOPHEN 650 MG RE SUPP: 650.0000 mg | Freq: Four times a day (QID) | RECTAL | Status: DC | PRN

## 2019-03-15 MED ORDER — SODIUM CHLORIDE 0.9 % IV SOLN
250.0000 mg | Freq: Two times a day (BID) | INTRAVENOUS | Status: DC
  Administered 2019-03-15: 250 mg via INTRAVENOUS
  Filled 2019-03-15 (×2): qty 2.5

## 2019-03-15 MED ORDER — SODIUM CHLORIDE 0.9 % IV SOLN
1.0000 g | Freq: Once | INTRAVENOUS | Status: AC
  Administered 2019-03-15: 1 g via INTRAVENOUS
  Filled 2019-03-15: qty 10

## 2019-03-15 MED ORDER — ENOXAPARIN SODIUM 300 MG/3ML IJ SOLN
20.0000 mg | INTRAMUSCULAR | Status: DC
  Administered 2019-03-15: 20 mg via SUBCUTANEOUS
  Filled 2019-03-15 (×3): qty 0.2

## 2019-03-15 MED ORDER — POLYVINYL ALCOHOL 1.4 % OP SOLN
1.0000 [drp] | Freq: Two times a day (BID) | OPHTHALMIC | Status: DC
  Filled 2019-03-15 (×2): qty 15

## 2019-03-15 MED ORDER — SODIUM CHLORIDE 0.9 % IV SOLN
1.0000 g | Freq: Once | INTRAVENOUS | Status: AC
  Administered 2019-03-16: 1 g via INTRAVENOUS
  Filled 2019-03-15: qty 10

## 2019-03-15 MED ORDER — ASPIRIN EC 81 MG PO TBEC: 81.0000 mg | DELAYED_RELEASE_TABLET | Freq: Every day | ORAL | Status: DC

## 2019-03-15 MED ORDER — BISACODYL 10 MG RE SUPP: 10.0000 mg | Freq: Every day | RECTAL | Status: DC | PRN

## 2019-03-15 MED ORDER — FUROSEMIDE 20 MG PO TABS: 20.0000 mg | ORAL_TABLET | ORAL | Status: DC

## 2019-03-15 MED ORDER — SODIUM CHLORIDE 0.9 % IV SOLN: 1.0000 g | INTRAVENOUS | Status: DC

## 2019-03-15 MED ORDER — IPRATROPIUM-ALBUTEROL 0.5-2.5 (3) MG/3ML IN SOLN: 3.0000 mL | RESPIRATORY_TRACT | Status: DC | PRN

## 2019-03-15 MED ORDER — METOPROLOL TARTRATE 50 MG PO TABS: 50.0000 mg | ORAL_TABLET | Freq: Two times a day (BID) | ORAL | Status: DC

## 2019-03-15 MED ORDER — SODIUM CHLORIDE 0.9 % IV SOLN
INTRAVENOUS | Status: DC
  Administered 2019-03-15: 13:00:00 via INTRAVENOUS

## 2019-03-15 MED ORDER — MAGNESIUM HYDROXIDE 400 MG/5ML PO SUSP: 30.0000 mL | Freq: Every day | ORAL | Status: DC | PRN

## 2019-03-15 MED ORDER — MONTELUKAST SODIUM 10 MG PO TABS: 10.0000 mg | ORAL_TABLET | Freq: Every day | ORAL | Status: DC

## 2019-03-15 MED ORDER — SODIUM CHLORIDE 0.9 % IV SOLN
250.0000 mg | Freq: Two times a day (BID) | INTRAVENOUS | Status: DC
  Administered 2019-03-15: 250 mg via INTRAVENOUS
  Filled 2019-03-15 (×3): qty 2.5

## 2019-03-15 MED ORDER — SODIUM CHLORIDE 0.9 % IV SOLN
500.0000 mg | INTRAVENOUS | Status: DC
  Administered 2019-03-15: 500 mg via INTRAVENOUS
  Filled 2019-03-15: qty 500

## 2019-03-15 MED ORDER — FERROUS SULFATE 325 (65 FE) MG PO TABS: 325.0000 mg | ORAL_TABLET | Freq: Every day | ORAL | Status: DC

## 2019-03-15 MED ORDER — FLEET ENEMA 7-19 GM/118ML RE ENEM: ENEMA | RECTAL | Status: DC | PRN

## 2019-03-15 MED ORDER — DILTIAZEM HCL 90 MG PO TABS
90.0000 mg | ORAL_TABLET | Freq: Two times a day (BID) | ORAL | Status: DC
  Filled 2019-03-15 (×2): qty 1

## 2019-03-15 MED ORDER — STROKE: EARLY STAGES OF RECOVERY BOOK
Freq: Once | Status: AC
  Administered 2019-03-15: 19:00:00

## 2019-03-15 MED ORDER — LEVETIRACETAM IN NACL 500 MG/100ML IV SOLN
500.0000 mg | Freq: Once | INTRAVENOUS | Status: AC
  Administered 2019-03-15: 500 mg via INTRAVENOUS
  Filled 2019-03-15: qty 100

## 2019-03-15 MED ORDER — NITROGLYCERIN 0.4 MG SL SUBL: 0.4000 mg | SUBLINGUAL_TABLET | SUBLINGUAL | Status: DC | PRN

## 2019-03-15 MED ORDER — VITAMIN B-12 1000 MCG PO TABS: 1000.0000 ug | ORAL_TABLET | ORAL | Status: DC

## 2019-03-15 MED ORDER — POLYETHYLENE GLYCOL 3350 17 G PO PACK: 17.0000 g | PACK | Freq: Every day | ORAL | Status: DC

## 2019-03-15 MED ORDER — MELATONIN 3 MG PO TABS
3.0000 mg | ORAL_TABLET | Freq: Every day | ORAL | Status: DC
  Filled 2019-03-15: qty 1

## 2019-03-15 NOTE — ED Notes (Signed)
ED TO INPATIENT HANDOFF REPORT  ED Nurse Name and Phone #:  Malisha Mabey [phone]  S Name/Age/Gender [name] 85 y.o. female Room/Bed: H017C/H017C  Code Status   Code Status: DNR  Home/SNF/Other Nursing Home Patient oriented to:  disoriented x4 Is this baseline? Yes   Triage Complete: Triage complete  Chief Complaint seizure  Triage Note Found by nursing staff twitching and unresponsive, ems called    Allergies Allergies  Allergen Reactions  . Codeine Other (See Comments)    Pt states that it knocks her out.   [name] Penicillins Rash    Hives, upset stomach     Level of Care/Admitting Diagnosis ED Disposition    ED Disposition Condition Comment   Admit  Hospital Area: [facility] [[id number]]  Level of Care: Telemetry Medical [104]  I expect the patient will be discharged within 24 hours: No (not a candidate for 5C-Observation unit)  Diagnosis: Seizure (HCC) [205090]  Admitting Physician: Hillary Bow [phone]  Attending Physician: Hillary Bow [[id number]  PT Class (Do Not Modify): Observation [104]  PT Acc Code (Do Not Modify): Observation [10022]       B Medical/Surgery History Past Medical History:  Diagnosis Date  . Altered mental status 01/01/2011   Qualifier: Diagnosis of  By: Earnest Bailey MD, Selena Batten    . Anemia   . Anxiety 03/09/2015  . Arthritis   . Asthma   . CAD (coronary artery disease)    a. reports prior h/o MI's with possible cath @ [facility] in [place] 'many years ago.'  . CAROTID ARTERY DISEASE 07/16/2007   a. 07/2012 U/S: no signif extracranial carotid artery stenosis, antegrad vertebral flow.  . CKD (chronic kidney disease), stage II   . COPD with asthma (HCC)   . CVA (cerebral vascular accident) (HCC)    a. She's not sure that this is true.  [name] DEFICIENCY, VITAMIN D NOS 07/16/2007   Qualifier: Diagnosis of  By: Irving Burton MD, Clifton Custard    . Diabetes mellitus, type 2 (HCC)   . Diastolic CHF, chronic (HCC)    a. 07/2013 Echo: EF  55-60%.  . Fibromyalgia   . H/O hiatal hernia   . Headache(784.0)   . High risk social situations 11/28/2011  . HLD (hyperlipidemia)   . HTN (hypertension)   . Hyperthyroidism   . Intention tremor 03/03/2007   Qualifier: Diagnosis of  By: Dorathy Daft  MD, IDAYLIS    . Neuromuscular disorder (HCC)   . Palpitations    a. 8.2014 Echo: EF 55-60%, no rwma, mod LVH, mildly dil LA, PASP .  [name] Recurrent upper respiratory infection (URI)   . Seizures (HCC)   . Syncope, near 02/28/2012  . Systolic murmur    a. no significant valvular abnormalities on echo 8.2014.  . TOBACCO USER   . Urinary incontinence   . Vitamin D deficiency    Past Surgical History:  Procedure Laterality Date  . APPENDECTOMY  1965  . KNEE SURGERY  1989, 1995  . VAGINAL HYSTERECTOMY  1965     A IV Location/Drains/Wounds Patient Lines/Drains/Airways Status   Active Line/Drains/Airways    Name:   Placement date:   Placement time:   Site:   Days:   Peripheral IV 03/14/19 Left Forearm   03/14/19    2314    Forearm   1          Intake/Output Last 24 hours No intake or output data in the 24 hours ending 03/15/19 0326  Labs/Imaging Results  for orders placed or performed during the hospital encounter of 03/14/19 (from the past 48 hour(s))  Protime-INR     Status: None   Collection Time: 03/14/19 11:22 PM  Result Value Ref Range   Prothrombin Time 13.5 11.4 - 15.2 seconds   INR 1.0 0.8 - 1.2    Comment: (NOTE) INR goal varies based on device and disease states. Performed at [facility], [address] [place], [place] [address]   APTT     Status: None   Collection Time: 03/14/19 11:22 PM  Result Value Ref Range   aPTT 24 24 - 36 seconds    Comment: Performed at [facility], [address], [place], [place] [address]  CBC     Status: Abnormal   Collection Time: 03/14/19 11:22 PM  Result Value Ref Range   WBC 11.8 (H) 4.0 - 10.5 K/uL   RBC 4.67 3.87 - 5.11 MIL/uL   Hemoglobin 12.4 12.0  - 15.0 g/dL   HCT 09.8 11.9 - 14.7 %   MCV 82.4 80.0 - 100.0 fL   MCH 26.6 26.0 - 34.0 pg   MCHC 32.2 30.0 - 36.0 g/dL   RDW 82.9 (H) 56.2 - 13.0 %   Platelets 303 150 - 400 K/uL   nRBC 0.0 0.0 - 0.2 %    Comment: Performed at [facility], [address] [place], [place] [address]  Differential     Status: Abnormal   Collection Time: 03/14/19 11:22 PM  Result Value Ref Range   Neutrophils Relative % 90 %   Neutro Abs 10.6 (H) 1.7 - 7.7 K/uL   Lymphocytes Relative 2 %   Lymphs Abs 0.2 (L) 0.7 - 4.0 K/uL   Monocytes Relative 7 %   Monocytes Absolute 0.9 0.1 - 1.0 K/uL   Eosinophils Relative 0 %   Eosinophils Absolute 0.0 0.0 - 0.5 K/uL   Basophils Relative 0 %   Basophils Absolute 0.1 0.0 - 0.1 K/uL   Immature Granulocytes 1 %   Abs Immature Granulocytes 0.06 0.00 - 0.07 K/uL    Comment: Performed at [facility], [address], [place], [place] [address]  Comprehensive metabolic panel     Status: Abnormal   Collection Time: 03/14/19 11:22 PM  Result Value Ref Range   Sodium 138 135 - 145 mmol/L   Potassium 3.6 3.5 - 5.1 mmol/L   Chloride 106 98 - 111 mmol/L   CO2 26 22 - 32 mmol/L   Glucose, Bld 161 (H) 70 - 99 mg/dL   BUN 15 8 - 23 mg/dL   Creatinine, Ser 9.52 (H) 0.44 - 1.00 mg/dL   Calcium 8.5 (L) 8.9 - 10.3 mg/dL   Total Protein 6.9 6.5 - 8.1 g/dL   Albumin 3.2 (L) 3.5 - 5.0 g/dL   AST 23 15 - 41 U/L   ALT 18 0 - 44 U/L   Alkaline Phosphatase 76 38 - 126 U/L   Total Bilirubin 0.6 0.3 - 1.2 mg/dL   GFR calc non Af Amer 41 (L) >60 mL/min   GFR calc Af Amer 48 (L) >60 mL/min   Anion gap 6 5 - 15    Comment: Performed at [facility], [address] [place], [place] [address]  I-stat Creatinine, ED     Status: Abnormal   Collection Time: 03/14/19 11:53 PM  Result Value Ref Range   Creatinine, Ser 1.10 (H) 0.44 - 1.00 mg/dL  Ethanol  Status: None   Collection Time: 03/15/19 12:17 AM  Result Value Ref Range   Alcohol, Ethyl (B) <10 <10 mg/dL     Comment: (NOTE) Lowest detectable limit for serum alcohol is 10 mg/dL. For medical purposes only. Performed at [facility], [address], [place], [place] [address]   Urine rapid drug screen (hosp performed)     Status: Abnormal   Collection Time: 03/15/19 12:41 AM  Result Value Ref Range   Opiates NONE DETECTED NONE DETECTED   Cocaine NONE DETECTED NONE DETECTED   Benzodiazepines POSITIVE (A) NONE DETECTED   Amphetamines NONE DETECTED NONE DETECTED   Tetrahydrocannabinol NONE DETECTED NONE DETECTED   Barbiturates NONE DETECTED NONE DETECTED    Comment: (NOTE) DRUG SCREEN FOR MEDICAL PURPOSES ONLY.  IF CONFIRMATION IS NEEDED FOR ANY PURPOSE, NOTIFY LAB WITHIN 5 DAYS. LOWEST DETECTABLE LIMITS FOR URINE DRUG SCREEN Drug Class                     Cutoff (ng/mL) Amphetamine and metabolites    1000 Barbiturate and metabolites    200 Benzodiazepine                 200 Tricyclics and metabolites     300 Opiates and metabolites        300 Cocaine and metabolites        300 THC                            50 Performed at [facility], [address] [place], [place] [address]   Urinalysis, Routine w reflex microscopic     Status: Abnormal   Collection Time: 03/15/19 12:41 AM  Result Value Ref Range   Color, Urine YELLOW YELLOW   APPearance CLEAR CLEAR   Specific Gravity, Urine 1.021 1.005 - 1.030   pH 5.0 5.0 - 8.0   Glucose, UA NEGATIVE NEGATIVE mg/dL   Hgb urine dipstick NEGATIVE NEGATIVE   Bilirubin Urine NEGATIVE NEGATIVE   Ketones, ur NEGATIVE NEGATIVE mg/dL   Protein, ur 098 (A) NEGATIVE mg/dL   Nitrite NEGATIVE NEGATIVE   Leukocytes,Ua NEGATIVE NEGATIVE   RBC / HPF 6-10 0 - 5 RBC/hpf   WBC, UA 0-5 0 - 5 WBC/hpf   Bacteria, UA RARE (A) NONE SEEN   Squamous Epithelial / LPF 0-5 0 - 5   Mucus PRESENT    Hyaline Casts, UA PRESENT    Ca Oxalate Crys, UA PRESENT     Comment: Performed at [facility], [address], [place], [place] [address]    Ct Head Wo Contrast  Result Date: 03/15/2019 CLINICAL DATA:  Initial evaluation for acute altered mental status, unresponsive. EXAM: CT HEAD WITHOUT CONTRAST TECHNIQUE: Contiguous axial images were obtained from the base of the skull through the vertex without intravenous contrast. COMPARISON:  Prior CT from 02/25/2015. FINDINGS: Brain: Advanced age-related cerebral atrophy with severe chronic microvascular ischemic disease, progressed relative to 2016. Superimposed chronic infarct noted at the posterior left frontal operculum. Small remote bilateral thalamic lacunar infarcts noted, similar to previous. No acute large vessel territory infarct. No acute intracranial hemorrhage. No mass lesion, midline shift or mass effect. Diffuse ventricular prominence related to global parenchymal volume loss of hydrocephalus. No extra-axial fluid collection. Vascular: No hyperdense vessel. Calcified atherosclerosis present at the skull base. Skull: Scalp soft tissues demonstrate no acute finding. Calvarium intact. Sinuses/Orbits: Globes and orbital soft tissues demonstrate  no acute finding. Paranasal sinuses are clear. Small moderate left mastoid effusion noted. Other: Degenerative changes noted within the visualized upper cervical spine. IMPRESSION: 1. No acute intracranial abnormality. 2. Advanced age-related cerebral atrophy with severe chronic microvascular ischemic disease, progressed relative to 2016. Electronically Signed   By: Rise Mu M.D.   On: 03/15/2019 00:39   Dg Chest Port 1 View  Result Date: 03/14/2019 CLINICAL DATA:  Shortness of breath. EXAM: PORTABLE CHEST 1 VIEW COMPARISON:  09/25/2015 FINDINGS: Unchanged heart size and mediastinal contours. Patchy right infrahilar opacity. Chronic interstitial coarsening is unchanged from prior. No pulmonary edema, large pleural effusion or pneumothorax. Bones are under mineralized. Scoliosis and degenerative change in the spine. Chronic degenerative  change of both shoulders. IMPRESSION: Patchy right infrahilar opacity may be atelectasis or pneumonia. Given distribution, aspiration is considered. Electronically Signed   By: Narda Rutherford M.D.   On: 03/14/2019 23:57    Pending Labs Unresulted Labs (From admission, onward)    Start     Ordered   03/15/19 0327  Magnesium  Once,   R     03/15/19 0326   03/15/19 0243  Culture, sputum-assessment  Once,   R     03/15/19 0247   03/15/19 0243  Gram stain  Once,   R     03/15/19 0247   03/15/19 0243  HIV antibody (Routine Screening)  Once,   R     03/15/19 0247   03/15/19 0243  Strep pneumoniae urinary antigen  Once,   R     03/15/19 0247   03/15/19 0132  Culture, blood (Routine X 2) w Reflex to ID Panel  BLOOD CULTURE X 2,   R     03/15/19 0131          Vitals/Pain Today's Vitals   03/15/19 0015 03/15/19 0030 03/15/19 0200 03/15/19 0321  BP: 107/71 129/85  (!) 144/80  Pulse: 70   68  Resp: 19 (!) 23  19  Temp:      TempSrc:      SpO2: 100%   96%  Weight:   44.8 kg   Height:   5\' 3"  (1.6 m)     Isolation Precautions No active isolations  Medications Medications  levETIRAcetam (KEPPRA) 250 mg in sodium chloride 0.9 % 100 mL IVPB (has no administration in time range)  enoxaparin (LOVENOX) injection 20 mg (has no administration in time range)  cefTRIAXone (ROCEPHIN) 1 g in sodium chloride 0.9 % 100 mL IVPB (has no administration in time range)  azithromycin (ZITHROMAX) 500 mg in sodium chloride 0.9 % 250 mL IVPB (has no administration in time range)  cefTRIAXone (ROCEPHIN) 1 g in sodium chloride 0.9 % 100 mL IVPB (has no administration in time range)  levETIRAcetam (KEPPRA) IVPB 500 mg/100 mL premix (0 mg Intravenous Stopped 03/15/19 0326)    Mobility non-ambulatory High fall risk   Focused Assessments Cardiac Assessment Handoff:    Lab Results  Component Value Date   CKTOTAL 131 02/28/2012   CKMB 2.5 02/28/2012   TROPONINI <0.03 09/25/2015   Lab Results   Component Value Date   DDIMER 1.96 (H) 12/28/2011   Does the Patient currently have chest pain? No  , Neuro Assessment Handoff:  Swallow screen pass? N/A     Last date known well: 03/14/19 Last time known well: 2230 Neuro Assessment: Exceptions to WDL Neuro Checks:      Last Documented NIHSS Modified Score:   Has TPA been given? No If patient  is a Neuro Trauma and patient is going to OR before floor call report to 4N Charge nurse: [phone] or [phone]     R Recommendations: See Admitting Provider Note  Report given to:   Additional Notes:  Total care, non-verbal, new-onset seizure, from [facility]

## 2019-03-15 NOTE — Progress Notes (Signed)
Patient admitted after midnight with acute encephalopathy/ seizure like activity. Work up reveled HCAP as well. Evaluated by neuro who loaded with keppra and recommended keppra bid as well as EEG and mri.   A/P  1. Seizure like activity - patient remains post ictal state and sedation from large dose of versed. She will moan to moderate sternal rub. EEG abnormal. Awaiting mri results 1. Neuro saw patient in consult 1. Continue Keppra 500mg  load then 250mg  BID 2. Seizure precautions 3. Tele monitor 4. Check mag 5. TSH was nl earlier this month 2. Apparent aspiration PNA in RLL on CXR - 1. Will keep current  Antibiotics due to allergies 2.  Rocephin / azithro day #2 3. Of note it looks like she does have scattered rhonchi at baseline though based on Dr. Caryl Never last 2 SNF notes, so not clear if the rhonchi im hearing today represent a change or not. 3. HTN, CAD, CHF, COPD, PSVT -fair control. Home meds include cardizem, lasix. Will hold these for now 1. monitor 4. Vascular dementia - 1. Remains quite obtunded  2. Continue to monitor   Clydie Braun m Marek Nghiem, np

## 2019-03-15 NOTE — Plan of Care (Signed)
Discussed in front of patient plan of care for the evening and pain management with no evidence of teach back at this time.

## 2019-03-15 NOTE — H&P (Signed)
History and Physical    [name] ZOX:[id number] DOB: 1920-01-22 DOA: 03/14/2019  PCP: Pecola Lawless, MD  Patient coming from: SNF  I have personally briefly reviewed patient's old medical records in [facility]  Chief Complaint: Seizure like activity  HPI: [name] is a 85 y.o. female with medical history significant of CAD, CVA, vascular dementia, DM2, HTN.  Patient presents to the ED from SNF.  Reportedly at baseline at dinner tonight.  Checked by staff just prior to being sent to ER and was altered.  EMS called.  EMS reports that upon their arrival to the nursing home the patient had some twitching and some tonic behavior that they were concerned might be a seizure.  Patient was also noted to be hypoxic, placed on a nonrebreather.  Patient was administered 5 mg of Versed IM and brought to the ER.  At arrival to the ER she is completely unresponsive and sedated   ED Course: While in the ER she was initially unresponsive except to painful stimuli and had R gaze preference.  This has slowly improved thus far over the evening.  Now she is sleepy, no R gaze preference, opens eyes to verbal though she isnt saying anything or following directions still.  Neurology evaluated patient, agreed that seizure seemed quite possible, Recd Keppra  IV load then 250 mg BID.   Review of Systems: Unable to perform secondary to patients mental status.  Past Medical History:  Diagnosis Date   Altered mental status 01/01/2011   Qualifier: Diagnosis of  By: Earnest Bailey MD, Kim     Anemia    Anxiety 03/09/2015   Arthritis    Asthma    CAD (coronary artery disease)    a. reports prior h/o MI's with possible cath @ [facility] in [place] 'many years ago.'   CAROTID ARTERY DISEASE 07/16/2007   a. 07/2012 U/S: no signif extracranial carotid artery stenosis, antegrad vertebral flow.   CKD (chronic kidney disease), stage II    COPD with asthma (HCC)    CVA (cerebral vascular  accident) (HCC)    a. She's not sure that this is true.   DEFICIENCY, VITAMIN D NOS 07/16/2007   Qualifier: Diagnosis of  By: Irving Burton MD, Clifton Custard     Diabetes mellitus, type 2 (HCC)    Diastolic CHF, chronic (HCC)    a. 07/2013 Echo: EF 55-60%.   Fibromyalgia    H/O hiatal hernia    Headache(784.0)    High risk social situations 11/28/2011   HLD (hyperlipidemia)    HTN (hypertension)    Hyperthyroidism    Intention tremor 03/03/2007   Qualifier: Diagnosis of  By: Dorathy Daft  MD, IDAYLIS     Neuromuscular disorder (HCC)    Palpitations    a. 8.2014 Echo: EF 55-60%, no rwma, mod LVH, mildly dil LA, PASP .   Recurrent upper respiratory infection (URI)    Seizures (HCC)    Syncope, near 02/28/2012   Systolic murmur    a. no significant valvular abnormalities on echo 8.2014.   TOBACCO USER    Urinary incontinence    Vitamin D deficiency     Past Surgical History:  Procedure Laterality Date   APPENDECTOMY  1965   KNEE SURGERY  1989, 1995   VAGINAL HYSTERECTOMY  1965     reports that she quit smoking about 6 years ago. Her smoking use included cigarettes. She smoked 0.50 packs per day. She has never used smokeless tobacco. She reports that  she does not drink alcohol or use drugs.  Allergies  Allergen Reactions   Codeine Other (See Comments)    Pt states that it knocks her out.    Penicillins Rash    Hives, upset stomach     Family History  Problem Relation Age of Onset   Diabetes Mother    Diabetes Brother    Stroke Father      Prior to Admission medications   Medication Sig Start Date End Date Taking? Authorizing Provider  acetaminophen (TYLENOL) 325 MG tablet Take 650 mg by mouth every 8 (eight) hours as needed for mild pain.   Yes [provider]  aspirin 81 MG tablet Take 81 mg by mouth daily.    Yes [provider]  bisacodyl (DULCOLAX) 10 MG suppository Place 10 mg rectally daily as needed for mild constipation.  Constipation (2 of 4): If not relieved by MOM, give 10 mg Bisacodyl suppositiory rectally X 1 dose in 24 hours as needed (Do not use constipation standing orders for residents with renal failure/CFR less than 30. Contact MD for orders)    Yes [provider]  budesonide (PULMICORT) 0.25 MG/2ML nebulizer solution Take 0.25 mg by nebulization 2 (two) times daily.   Yes [provider]  diltiazem (CARDIZEM) 90 MG tablet Take 90 mg by mouth 2 (two) times daily.   Yes [provider]  ferrous sulfate 325 (65 FE) MG tablet Take 325 mg by mouth daily with breakfast.    Yes [provider]  furosemide (LASIX) 20 MG tablet Take 20 mg by mouth every other day.    Yes [provider]  ipratropium-albuterol (DUONEB) 0.5-2.5 (3) MG/3ML SOLN Take 3 mLs by nebulization every 4 (four) hours as needed (SOB).    Yes [provider]  magnesium hydroxide (MILK OF MAGNESIA) 400 MG/5ML suspension Take 30 mLs by mouth daily as needed for mild constipation. Constipation (1 of 4): If no BM in 3 days, give 30 cc Milk of Magnesium p.o. x 1 dose in 24 hours as needed (Do not use standing constipation orders for residents with renal failure CFR less than 30. Contact MD for orders)    Yes [provider]  Melatonin 3 MG CAPS Take 3 mg by mouth at bedtime.   Yes [provider]  Menthol, Topical Analgesic, (BIOFREEZE EX) Apply topically 3 (three) times daily as needed (to knees and shoulders).   Yes [provider]  metoprolol (LOPRESSOR) 50 MG tablet Take 50 mg by mouth 2 (two) times daily. Hold for HR<60 or systolic >90   Yes [provider]  montelukast (SINGULAIR) 10 MG tablet Take 10 mg by mouth daily.   Yes [provider]  nitroGLYCERIN (NITROSTAT) 0.4 MG SL tablet Place 0.4 mg under the tongue every 5 (five) minutes as needed for chest pain.   Yes [provider]  Olopatadine HCl (PATADAY) 0.2 % SOLN Place 1 drop into  both eyes daily.    Yes [provider]  polyethylene glycol (MIRALAX / GLYCOLAX) packet Take 17 g by mouth daily.   Yes [provider]  Propylene Glycol (SYSTANE BALANCE) 0.6 % SOLN Place 1 drop into both eyes 2 (two) times daily.   Yes [provider]  sertraline (ZOLOFT) 50 MG tablet Take 50 mg by mouth daily.   Yes [provider]  Sodium Phosphates (RA SALINE ENEMA) 19-7 GM/118ML ENEM Place 1 each rectally as needed (for constipation).   Yes [provider]  UNABLE TO FIND Take 1 each by mouth 2 (two) times daily. Med Name: Magic cup   Yes [provider]  UNABLE TO FIND Apply 1 application topically 3 (three) times daily. Med Name: Ativan gel 0.25 mg   Yes [provider]  UNABLE TO FIND Take 120 mLs by mouth 3 (three) times daily. Med Name: Med Pass   Yes [provider]  vitamin B-12 (CYANOCOBALAMIN) 1000 MCG tablet Take 1,000 mcg by mouth every other day.    Yes [provider]  albuterol (PROVENTIL) (2.5 MG/3ML) 0.083% nebulizer solution Take 3 mLs (2.5 mg total) by nebulization every 6 (six) hours as needed for wheezing. 11/28/11 11/27/12  Hairford, Ricki Miller, MD    Physical Exam: Vitals:   03/15/19 0000 03/15/19 0015 03/15/19 0030 03/15/19 0200  BP: 104/74 107/71 129/85   Pulse: 73 70    Resp: 12 19 (!) 23   Temp:      TempSrc:      SpO2: 100% 100%    Weight:    44.8 kg  Height:     (1.6 m)    Constitutional: Lethargic Eyes: PERRL, lids and conjunctivae normal ENMT: Mucous membranes are moist. Posterior pharynx clear of any exudate or lesions.Normal dentition.  Neck: normal, supple, no masses, no thyromegaly Respiratory: Coarse BS Cardiovascular: Systolic murmur. Abdomen: no tenderness, no masses palpated. No hepatosplenomegaly. Bowel sounds positive.  Musculoskeletal: no clubbing / cyanosis. No joint deformity upper and lower extremities. Good ROM, no contractures. Normal muscle tone.    Skin: no rashes, lesions, ulcers. No induration Neurologic: sleepy, opens eyes to verbal though she isnt saying anything or following directions still. Psychiatric: lethargic, non-verbal at the moment   Labs on Admission: I have personally reviewed following labs and imaging studies  CBC: Recent Labs  Lab 03/14/19 2322  WBC 11.8*  NEUTROABS 10.6*  HGB 12.4  HCT 38.5  MCV 82.4  PLT 303   Basic Metabolic Panel: Recent Labs  Lab 03/14/19 2322 03/14/19 2353  NA 138  --   K 3.6  --   CL 106  --   CO2 26  --   GLUCOSE 161*  --   BUN 15  --   CREATININE 1.13* 1.10*  CALCIUM 8.5*  --    GFR: Estimated Creatinine Clearance: 21.2 mL/min (A) (by C-G formula based on SCr of 1.1 mg/dL (H)). Liver Function Tests: Recent Labs  Lab 03/14/19 2322  AST 23  ALT 18  ALKPHOS 76  BILITOT 0.6  PROT 6.9  ALBUMIN 3.2*   No results for input(s): LIPASE, AMYLASE in the last 168 hours. No results for input(s): AMMONIA in the last 168 hours. Coagulation Profile: Recent Labs  Lab 03/14/19 2322  INR 1.0   Cardiac Enzymes: No results for input(s): CKTOTAL, CKMB, CKMBINDEX, TROPONINI in the last 168 hours. BNP (last 3 results) No results for input(s): PROBNP in the last 8760 hours. HbA1C: No results for input(s): HGBA1C in the last 72 hours. CBG: No results for input(s): GLUCAP in the last 168 hours. Lipid Profile: No results for input(s): CHOL, HDL, LDLCALC, TRIG, CHOLHDL, LDLDIRECT in the last 72 hours. Thyroid Function Tests: No results for input(s): TSH, T4TOTAL, FREET4, T3FREE, THYROIDAB in the last 72 hours. Anemia Panel: No results for input(s): VITAMINB12, FOLATE, FERRITIN, TIBC, IRON, RETICCTPCT in the last 72 hours. Urine analysis:    Component Value Date/Time   COLORURINE YELLOW 03/15/2019 0041   APPEARANCEUR CLEAR 03/15/2019 0041   LABSPEC 1.021 03/15/2019  0041   PHURINE 5.0 03/15/2019 0041   GLUCOSEU NEGATIVE 03/15/2019 0041   HGBUR NEGATIVE 03/15/2019 0041    BILIRUBINUR NEGATIVE 03/15/2019 0041   KETONESUR NEGATIVE 03/15/2019 0041   PROTEINUR 100 (A) 03/15/2019 0041   UROBILINOGEN 0.2 09/25/2015 2112   NITRITE NEGATIVE 03/15/2019 0041   LEUKOCYTESUR NEGATIVE 03/15/2019 0041    Radiological Exams on Admission: Ct Head Wo Contrast  Result Date: 03/15/2019 CLINICAL DATA:  Initial evaluation for acute altered mental status, unresponsive. EXAM: CT HEAD WITHOUT CONTRAST TECHNIQUE: Contiguous axial images were obtained from the base of the skull through the vertex without intravenous contrast. COMPARISON:  Prior CT from 02/25/2015. FINDINGS: Brain: Advanced age-related cerebral atrophy with severe chronic microvascular ischemic disease, progressed relative to 2016. Superimposed chronic infarct noted at the posterior left frontal operculum. Small remote bilateral thalamic lacunar infarcts noted, similar to previous. No acute large vessel territory infarct. No acute intracranial hemorrhage. No mass lesion, midline shift or mass effect. Diffuse ventricular prominence related to global parenchymal volume loss of hydrocephalus. No extra-axial fluid collection. Vascular: No hyperdense vessel. Calcified atherosclerosis present at the skull base. Skull: Scalp soft tissues demonstrate no acute finding. Calvarium intact. Sinuses/Orbits: Globes and orbital soft tissues demonstrate no acute finding. Paranasal sinuses are clear. Small moderate left mastoid effusion noted. Other: Degenerative changes noted within the visualized upper cervical spine. IMPRESSION: 1. No acute intracranial abnormality. 2. Advanced age-related cerebral atrophy with severe chronic microvascular ischemic disease, progressed relative to 2016. Electronically Signed   By: Rise Mu M.D.   On: 03/15/2019 00:39   Dg Chest Port 1 View  Result Date: 03/14/2019 CLINICAL DATA:  Shortness of breath. EXAM: PORTABLE CHEST 1 VIEW COMPARISON:  09/25/2015 FINDINGS: Unchanged heart size and mediastinal  contours. Patchy right infrahilar opacity. Chronic interstitial coarsening is unchanged from prior. No pulmonary edema, large pleural effusion or pneumothorax. Bones are under mineralized. Scoliosis and degenerative change in the spine. Chronic degenerative change of both shoulders. IMPRESSION: Patchy right infrahilar opacity may be atelectasis or pneumonia. Given distribution, aspiration is considered. Electronically Signed   By: Narda Rutherford M.D.   On: 03/14/2019 23:57    EKG: Independently reviewed.  Assessment/Plan Principal Problem:   Seizure [facility]) Active Problems:   Essential hypertension   CAD (coronary artery disease)   Diastolic CHF, chronic (HCC)   Vascular dementia without behavioral disturbance (HCC)   COPD with asthma (HCC)    1. Seizure like activity - with combo of post ictal state and sedation from large dose of versed, mental status seems to be slowly but definitely improving in ED thus far. 1. Neuro saw patient in consult 1. Keppra 500mg  load then 250mg  BID 2. Seizure precautions 3. Has systolic murmur, could consider 2D echo, but dont really think shed be a great interventional candidate given age, baseline poor functioning, etc. 4. Tele monitor 5. Check mag 6. TSH was nl earlier this month 2. Apparent aspiration PNA in RLL on CXR - 1. Will put on CAP treatment 2. Rocephin / azithro 3. Of note it looks like she does have scattered rhonchi at baseline though based on Dr. Caryl Never last 2 SNF notes, so not clear if the rhonchi im hearing today represent a change or not. 3. HTN, CAD, CHF, COPD, PSVT - 1. Resume home Meds in AM if mental status improves enough to be able to take them 2. Otherwise may need to use IV meds 4. Vascular dementia - 1. Seems to be pretty advanced and severe based on Dr.  Hopper's last 2 SNF notes including one from earlier this month  DVT prophylaxis: Lovenox Code Status: DNR/DNI - MOST form with patient Family Communication: No family in  room - patient has a legal guardian and is a ward of [place] [employer]. Disposition Plan: SNF after admit Consults called: None Admission status: Place in obs     Riya Huxford M. DO [facility]  How to contact the [facility] Attending or Consulting provider 7A - 7P or covering provider during after hours 7P -7A, for this patient?  1. Check the care team in [facility] and look for a) attending/consulting [facility] provider listed and b) the [facility] team listed 2. Log into [email or web address]  Amion Physician Scheduling and messaging for groups and whole hospitals  On call and physician scheduling software for group practices, residents, hospitalists and other medical providers for call, clinic, rotation and shift schedules. On[employer] is a hospital-wide system for scheduling doctors and paging doctors on call. EasyPlot is for scientific plotting and data analysis.  [email or web address]  and use [place]'s universal password to access. If you do not have the password, please contact the hospital operator.  3. Locate the [facility] provider you are looking for under [facility] and page to a number that you can be directly reached. 4. If you still have difficulty reaching the provider, please page the [facility] (Director on Call) for the Hospitalists listed on amion for assistance.  03/15/2019, 3:19 AM

## 2019-03-15 NOTE — Procedures (Signed)
ELECTROENCEPHALOGRAM REPORT   Patient: [name]       Room #: 3T51V EEG No. ID: [id number] Age: 85 y.o.        Sex: female Referring Physician: Benjamine Mola Report Date:  03/15/2019        Interpreting Physician: Thana Farr  History: [name] is an 85 y.o. female presenting with altered mental status  Medications:  Zithromax, Rocephin, Keppra  Conditions of Recording:  This is a 21 channel routine scalp EEG performed with bipolar and monopolar montages arranged in accordance to the international 10/20 system of electrode placement. One channel was dedicated to EKG recording.  The patient is in the awake, drowsy and asleep states.  Description:  During wakefulness the waking background activity consists of a low voltage, fairly well organized, 8 Hz alpha activity, seen from the parieto-occipital and posterior temporal regions.  This is noted more reproducibly on the on right and is often slowed on the left  Low voltage fast activity, poorly organized, is seen anteriorly and is at times superimposed on more posterior regions.  A mixture of theta and alpha rhythms are seen from the central and temporal regions.  This background rhythm is often slowed on the left, particularly in the temporal region, due to an underlying polymorphic delta rhythm.   The patient drowses with slowing to irregular, low voltage theta and beta activity and resolution of the hemispheric asymmetry noted during wakefulness.   The patient goes in to a light sleep with symmetrical sleep spindles, vertex central sharp transients and irregular slow activity.   No epileptiform activity is noted.   Hyperventilation and intermittent photic stimulation were not performed.   IMPRESSION: This is an abnormal EEG secondary to focal slowing in the left hemisphere, most notably temporal region, during wakefulness.   This finding is suggestive of a focal disturbance that is etiologically nonspecific, but may include a  mass lesion among other possibilities.  Clinical correlation recommended.  No epileptiform activity is noted.       Thana Farr, MD Neurology [phone] 03/15/2019, 10:41 AM

## 2019-03-15 NOTE — Progress Notes (Signed)
Patient became combative when attempting to start PIV. Primary RN, Raynelle Fanning was notified and 2 more nurses came to check patient due to the above reason.  Hulda Marin IV VAST TEAM

## 2019-03-15 NOTE — Progress Notes (Signed)
EEG Completed; Results Pending  

## 2019-03-15 NOTE — ED Provider Notes (Signed)
[facility] EMERGENCY DEPARTMENT Provider Note   CSN: [id number] Arrival date & time: 03/14/19  2312    History   Chief Complaint Chief Complaint  Patient presents with   Seizures    seizure like activity    HPI [name] is a 85 y.o. female.     Patient sent to the emergency department from [facility] skilled nursing facility for altered mental status.  Patient was reportedly her normal self at dinnertime tonight.  She was checked on by staff just prior to being sent to the ER and was altered.  EMS reports that upon their arrival to the nursing home the patient had some twitching and some tonic behavior that they were concerned might be a seizure.  Patient was also noted to be hypoxic, placed on a nonrebreather.  Patient was administered 5 mg of Versed IM and brought to the ER.  At arrival to the ER she is completely unresponsive and sedated. Level V Caveat due to mental status change.     Past Medical History:  Diagnosis Date   Altered mental status 01/01/2011   Qualifier: Diagnosis of  By: Earnest Bailey MD, Kim     Anemia    Anxiety 03/09/2015   Arthritis    Asthma    CAD (coronary artery disease)    a. reports prior h/o MI's with possible cath @ [facility] in [place] 'many years ago.'   CAROTID ARTERY DISEASE 07/16/2007   a. 07/2012 U/S: no signif extracranial carotid artery stenosis, antegrad vertebral flow.   CKD (chronic kidney disease), stage II    COPD with asthma (HCC)    CVA (cerebral vascular accident) (HCC)    a. She's not sure that this is true.   DEFICIENCY, VITAMIN D NOS 07/16/2007   Qualifier: Diagnosis of  By: Irving Burton MD, Clifton Custard     Diabetes mellitus, type 2 (HCC)    Diastolic CHF, chronic (HCC)    a. 07/2013 Echo: EF 55-60%.   Fibromyalgia    H/O hiatal hernia    Headache(784.0)    High risk social situations 11/28/2011   HLD (hyperlipidemia)    HTN (hypertension)    Hyperthyroidism    Intention tremor 03/03/2007     Qualifier: Diagnosis of  By: Dorathy Daft  MD, IDAYLIS     Neuromuscular disorder (HCC)    Palpitations    a. 8.2014 Echo: EF 55-60%, no rwma, mod LVH, mildly dil LA, PASP .   Recurrent upper respiratory infection (URI)    Seizures (HCC)    Syncope, near 02/28/2012   Systolic murmur    a. no significant valvular abnormalities on echo 8.2014.   TOBACCO USER    Urinary incontinence    Vitamin D deficiency     Patient Active Problem List   Diagnosis Date Noted   Chorioretinal scar 09/03/2018   COPD with asthma (HCC) 08/28/2017   History of hyperthyroidism 07/08/2017   Chronic bronchitis (HCC) 03/11/2017   Urinary tract infection due to extended-spectrum beta lactamase (ESBL) producing Escherichia coli 01/23/2017   Anxiety 03/09/2015   Wandering in diseases classified elsewhere 03/09/2015   PSVT (paroxysmal supraventricular tachycardia) (HCC) 01/07/2015   Constipation 12/27/2014   Anemia, iron deficiency 10/06/2014   Vascular dementia without behavioral disturbance (HCC) 07/18/2014   Palpitations 08/17/2013   Trigeminy 08/17/2013   Toenail deformity 04/30/2013   Insomnia 03/12/2012   Mobility impaired 01/07/2012   Diastolic CHF, chronic (HCC) 12/31/2011   CAD (coronary artery disease) 12/28/2011   Abnormality of gait  07/20/2009   Osteoporosis 10/01/2007   Essential hypertension 08/07/2007   Pre-diabetes 07/16/2007   C V A/STROKE 07/16/2007   URINARY INCONTINENCE, URGE 07/16/2007   HX, PERSONAL, THROMBOPHLEBITIS 07/16/2007   Hyperlipidemia with target LDL less than 70 03/03/2007   ARTHROPATHY NEC, SHOULDER 03/03/2007   Osteoarthritis 03/03/1979    Past Surgical History:  Procedure Laterality Date   APPENDECTOMY  1965   KNEE SURGERY  1989, 1995   VAGINAL HYSTERECTOMY  1965     OB History   No obstetric history on file.      Home Medications    Prior to Admission medications   Medication Sig Start Date End Date  Taking? Authorizing Provider  acetaminophen (TYLENOL) 325 MG tablet Take 650 mg by mouth every 8 (eight) hours as needed for mild pain.   Yes [provider]  aspirin 81 MG tablet Take 81 mg by mouth daily.    Yes [provider]  bisacodyl (DULCOLAX) 10 MG suppository Place 10 mg rectally daily as needed for mild constipation. Constipation (2 of 4): If not relieved by MOM, give 10 mg Bisacodyl suppositiory rectally X 1 dose in 24 hours as needed (Do not use constipation standing orders for residents with renal failure/CFR less than 30. Contact MD for orders)    Yes [provider]  budesonide (PULMICORT) 0.25 MG/2ML nebulizer solution Take 0.25 mg by nebulization 2 (two) times daily.   Yes [provider]  diltiazem (CARDIZEM) 90 MG tablet Take 90 mg by mouth 2 (two) times daily.   Yes [provider]  ferrous sulfate 325 (65 FE) MG tablet Take 325 mg by mouth daily with breakfast.    Yes [provider]  furosemide (LASIX) 20 MG tablet Take 20 mg by mouth every other day.    Yes [provider]  ipratropium-albuterol (DUONEB) 0.5-2.5 (3) MG/3ML SOLN Take 3 mLs by nebulization every 4 (four) hours as needed (SOB).    Yes [provider]  magnesium hydroxide (MILK OF MAGNESIA) 400 MG/5ML suspension Take 30 mLs by mouth daily as needed for mild constipation. Constipation (1 of 4): If no BM in 3 days, give 30 cc Milk of Magnesium p.o. x 1 dose in 24 hours as needed (Do not use standing constipation orders for residents with renal failure CFR less than 30. Contact MD for orders)    Yes [provider]  Melatonin 3 MG CAPS Take 3 mg by mouth at bedtime.   Yes [provider]  Menthol, Topical Analgesic, (BIOFREEZE EX) Apply topically 3 (three) times daily as needed (to knees and shoulders).   Yes [provider]  metoprolol (LOPRESSOR) 50 MG tablet Take 50 mg by mouth 2 (two) times daily. Hold for HR<60 or  systolic >90   Yes [provider]  montelukast (SINGULAIR) 10 MG tablet Take 10 mg by mouth daily.   Yes [provider]  nitroGLYCERIN (NITROSTAT) 0.4 MG SL tablet Place 0.4 mg under the tongue every 5 (five) minutes as needed for chest pain.   Yes [provider]  Olopatadine HCl (PATADAY) 0.2 % SOLN Place 1 drop into both eyes daily.    Yes [provider]  polyethylene glycol (MIRALAX / GLYCOLAX) packet Take 17 g by mouth daily.   Yes [provider]  Propylene Glycol (SYSTANE BALANCE) 0.6 % SOLN Place 1 drop into both eyes 2 (two) times daily.   Yes [provider]  sertraline (ZOLOFT) 50 MG tablet Take 50 mg  by mouth daily.   Yes [provider]  Sodium Phosphates (RA SALINE ENEMA) 19-7 GM/118ML ENEM Place 1 each rectally as needed (for constipation).   Yes [provider]  UNABLE TO FIND Take 1 each by mouth 2 (two) times daily. Med Name: Magic cup   Yes [provider]  UNABLE TO FIND Apply 1 application topically 3 (three) times daily. Med Name: Ativan gel 0.25 mg   Yes [provider]  UNABLE TO FIND Take 120 mLs by mouth 3 (three) times daily. Med Name: Med Pass   Yes [provider]  vitamin B-12 (CYANOCOBALAMIN) 1000 MCG tablet Take 1,000 mcg by mouth every other day.    Yes [provider]  albuterol (PROVENTIL) (2.5 MG/3ML) 0.083% nebulizer solution Take 3 mLs (2.5 mg total) by nebulization every 6 (six) hours as needed for wheezing. 11/28/11 11/27/12  Hairford, Ricki Miller, MD    Family History Family History  Problem Relation Age of Onset   Diabetes Mother    Diabetes Brother    Stroke Father     Social History Social History   Tobacco Use   Smoking status: Former Smoker    Packs/day: 0.50    Types: Cigarettes    Last attempt to quit: 04/06/2012    Years since quitting: 6.9   Smokeless tobacco: Never Used   Tobacco comment: hx unreliable due to dementia,  possibly 60 pack years  Substance Use Topics   Alcohol use: No   Drug use: No     Allergies   Codeine and Penicillins   Review of Systems Review of Systems  Unable to perform ROS: Mental status change     Physical Exam Updated Vital Signs BP 129/85    Pulse 70    Temp 99.1 F (37.3 C) (Rectal)    Resp (!) 23    SpO2 100%   Physical Exam Vitals signs and nursing note reviewed.  Constitutional:      General: She is not in acute distress.    Appearance: Normal appearance. She is well-developed.  HENT:     Head: Normocephalic and atraumatic.     Right Ear: Hearing normal.     Left Ear: Hearing normal.     Nose: Nose normal.  Eyes:     Conjunctiva/sclera: Conjunctivae normal.     Pupils: Pupils are equal, round, and reactive to light.  Neck:     Musculoskeletal: Normal range of motion and neck supple.  Cardiovascular:     Rate and Rhythm: Regular rhythm.     Heart sounds: S1 normal and S2 normal. No murmur. No friction rub. No gallop.   Pulmonary:     Effort: Pulmonary effort is normal. Tachypnea present. No respiratory distress.     Breath sounds: Normal breath sounds.  Chest:     Chest wall: No tenderness.  Abdominal:     General: Bowel sounds are normal.     Palpations: Abdomen is soft.     Tenderness: There is no abdominal tenderness. There is no guarding or rebound. Negative signs include Murphy's sign and McBurney's sign.     Hernia: No hernia is present.  Musculoskeletal: Normal range of motion.  Skin:    General: Skin is warm and dry.     Findings: No rash.  Neurological:     Mental Status: She is unresponsive.     GCS: GCS eye subscore is 1. GCS verbal subscore is 1. GCS motor subscore is 4.     Comments:  At arrival to the emergency department, patient is essentially unresponsive.  She seems to withdraw from painful stimuli, but does not follow any commands.  No obvious focal deficits of extremity movement present, but she does have a persistent  rightward gaze noted.    Psychiatric:        Speech: Speech normal.        Behavior: Behavior normal.        Thought Content: Thought content normal.      ED Treatments / Results  Labs (all labs ordered are listed, but only abnormal results are displayed) Labs Reviewed  CBC - Abnormal; Notable for the following components:      Result Value   WBC 11.8 (*)    RDW 17.2 (*)    All other components within normal limits  DIFFERENTIAL - Abnormal; Notable for the following components:   Neutro Abs 10.6 (*)    Lymphs Abs 0.2 (*)    All other components within normal limits  COMPREHENSIVE METABOLIC PANEL - Abnormal; Notable for the following components:   Glucose, Bld 161 (*)    Creatinine, Ser 1.13 (*)    Calcium 8.5 (*)    Albumin 3.2 (*)    GFR calc non Af Amer 41 (*)    GFR calc Af Amer 48 (*)    All other components within normal limits  RAPID URINE DRUG SCREEN, HOSP PERFORMED - Abnormal; Notable for the following components:   Benzodiazepines POSITIVE (*)    All other components within normal limits  URINALYSIS, ROUTINE W REFLEX MICROSCOPIC - Abnormal; Notable for the following components:   Protein, ur 100 (*)    Bacteria, UA RARE (*)    All other components within normal limits  I-STAT CREATININE, ED - Abnormal; Notable for the following components:   Creatinine, Ser 1.10 (*)    All other components within normal limits  CULTURE, BLOOD (ROUTINE X 2)  CULTURE, BLOOD (ROUTINE X 2)  ETHANOL  PROTIME-INR  APTT    EKG None  Radiology Ct Head Wo Contrast  Result Date: 03/15/2019 CLINICAL DATA:  Initial evaluation for acute altered mental status, unresponsive. EXAM: CT HEAD WITHOUT CONTRAST TECHNIQUE: Contiguous axial images were obtained from the base of the skull through the vertex without intravenous contrast. COMPARISON:  Prior CT from 02/25/2015. FINDINGS: Brain: Advanced age-related cerebral atrophy with severe chronic microvascular ischemic disease, progressed  relative to 2016. Superimposed chronic infarct noted at the posterior left frontal operculum. Small remote bilateral thalamic lacunar infarcts noted, similar to previous. No acute large vessel territory infarct. No acute intracranial hemorrhage. No mass lesion, midline shift or mass effect. Diffuse ventricular prominence related to global parenchymal volume loss of hydrocephalus. No extra-axial fluid collection. Vascular: No hyperdense vessel. Calcified atherosclerosis present at the skull base. Skull: Scalp soft tissues demonstrate no acute finding. Calvarium intact. Sinuses/Orbits: Globes and orbital soft tissues demonstrate no acute finding. Paranasal sinuses are clear. Small moderate left mastoid effusion noted. Other: Degenerative changes noted within the visualized upper cervical spine. IMPRESSION: 1. No acute intracranial abnormality. 2. Advanced age-related cerebral atrophy with severe chronic microvascular ischemic disease, progressed relative to 2016. Electronically Signed   By: Rise Mu M.D.   On: 03/15/2019 00:39   Dg Chest Port 1 View  Result Date: 03/14/2019 CLINICAL DATA:  Shortness of breath. EXAM: PORTABLE CHEST 1 VIEW COMPARISON:  09/25/2015 FINDINGS: Unchanged heart size and mediastinal contours. Patchy right infrahilar opacity. Chronic interstitial coarsening is unchanged from prior. No pulmonary edema, large pleural  effusion or pneumothorax. Bones are under mineralized. Scoliosis and degenerative change in the spine. Chronic degenerative change of both shoulders. IMPRESSION: Patchy right infrahilar opacity may be atelectasis or pneumonia. Given distribution, aspiration is considered. Electronically Signed   By: Narda Rutherford M.D.   On: 03/14/2019 23:57    Procedures Procedures (including critical care time)  Medications Ordered in ED Medications  levETIRAcetam (KEPPRA) IVPB 500 mg/100 mL premix (500 mg Intravenous New Bag/Given 03/15/19 0203)     Initial Impression  / Assessment and Plan / ED Course  I have reviewed the triage vital signs and the nursing notes.  Pertinent labs & imaging results that were available during my care of the patient were reviewed by me and considered in my medical decision making (see chart for details).        Patient sent to the ER for altered mental status.  EMS was concerned about the possibility of seizure, administered Versed IM and patient arrived to the ER obtunded.  Patient is a DNR, has a most form that states that she would accept antibiotic and basic treatments.  No intubation, no CPR.  Examination did not show any obvious seizure activity at arrival.  There was questionable seizure activity for EMS, however.  It was unclear if the patient was significantly sedated from the Versed or if there was also a postictal state.  She did, however, have a persistent rightward gaze at arrival and this seems to have improved over time and she is seemingly less distressed now.  Suspect that there was seizure with postictal state, appreciate neurology consultation.  Patient administered Keppra 500 mg IV per recommendation and should be continued on 250 mg twice daily.  Chest x-ray suspicious for possible pneumonia, possible aspiration, will admit to medicine.  CRITICAL CARE Performed by: Gilda Crease   Total critical care time: 35 minutes  Critical care time was exclusive of separately billable procedures and treating other patients.  Critical care was necessary to treat or prevent imminent or life-threatening deterioration.  Critical care was time spent personally by me on the following activities: development of treatment plan with patient and/or surrogate as well as nursing, discussions with consultants, evaluation of patient's response to treatment, examination of patient, obtaining history from patient or surrogate, ordering and performing treatments and interventions, ordering and review of laboratory studies,  ordering and review of radiographic studies, pulse oximetry and re-evaluation of patient's condition.   Final Clinical Impressions(s) / ED Diagnoses   Final diagnoses:  Seizure (HCC)  Pneumonia of right lung due to infectious organism, unspecified part of lung    ED Discharge Orders    None       Cyrene Gharibian, Canary Brim, MD 03/15/19 [id number]

## 2019-03-15 NOTE — Consult Note (Signed)
Requesting Physician: Dr. Blinda Leatherwood    Chief Complaint: Altered level of consciousness, seizure like activity   History obtained from: Patient and Chart   HPI:                                                                                                                                       [name] is an 85 y.o. female with past medical history of diabetes, coronary artery disease, CVA, CKD presents to the emergency room after skilled nursing facility staff noticed patient was more confused and had some twitching activity.  EMS was called and patient noted to be hypoxic and was given 5 mg of IM Versed and brought to the emergency room.  On arrival patient was completely unresponsive.  There was mention of right gaze deviation, however this had resolved by the time patient was seen by the ED physician.  Stat CT head was obtained which showed no acute findings.  Last known normal was at dinner around 7 PM.  On my assessment, patient was very somnolent.  She withdrew equally in all 4 extremities but not following any commands.  Appeared to be protecting her airway.    Past Medical History:  Diagnosis Date  . Altered mental status 01/01/2011   Qualifier: Diagnosis of  By: Earnest Bailey MD, Selena Batten    . Anemia   . Anxiety 03/09/2015  . Arthritis   . Asthma   . CAD (coronary artery disease)    a. reports prior h/o MI's with possible cath @ [facility] in [place] 'many years ago.'  . CAROTID ARTERY DISEASE 07/16/2007   a. 07/2012 U/S: no signif extracranial carotid artery stenosis, antegrad vertebral flow.  . CKD (chronic kidney disease), stage II   . COPD with asthma (HCC)   . CVA (cerebral vascular accident) (HCC)    a. She's not sure that this is true.  [name] DEFICIENCY, VITAMIN D NOS 07/16/2007   Qualifier: Diagnosis of  By: Irving Burton MD, Clifton Custard    . Diabetes mellitus, type 2 (HCC)   . Diastolic CHF, chronic (HCC)    a. 07/2013 Echo: EF 55-60%.  . Fibromyalgia   . H/O hiatal hernia   .  Headache(784.0)   . High risk social situations 11/28/2011  . HLD (hyperlipidemia)   . HTN (hypertension)   . Hyperthyroidism   . Intention tremor 03/03/2007   Qualifier: Diagnosis of  By: Dorathy Daft  MD, IDAYLIS    . Neuromuscular disorder (HCC)   . Palpitations    a. 8.2014 Echo: EF 55-60%, no rwma, mod LVH, mildly dil LA, PASP .  [name] Recurrent upper respiratory infection (URI)   . Seizures (HCC)   . Syncope, near 02/28/2012  . Systolic murmur    a. no significant valvular abnormalities on echo 8.2014.  . TOBACCO USER   . Urinary incontinence   . Vitamin D deficiency     Past Surgical History:  Procedure Laterality Date  . APPENDECTOMY  1965  . KNEE SURGERY  1989, 1995  . VAGINAL HYSTERECTOMY  1965    Family History  Problem Relation Age of Onset  . Diabetes Mother   . Diabetes Brother   . Stroke Father    Social History:  reports that she quit smoking about 6 years ago. Her smoking use included cigarettes. She smoked 0.50 packs per day. She has never used smokeless tobacco. She reports that she does not drink alcohol or use drugs.  Allergies:  Allergies  Allergen Reactions  . Codeine Other (See Comments)    Pt states that it knocks her out.   [name] Penicillins Rash    Hives, upset stomach     Medications:                                                                                                                        I reviewed home medications   ROS:                                                                                                                                     Unable to obtain due to altered mental status   Examination:                                                                                                      General: Appears well-developed and well-nourished.  Psych: Affect appropriate to situation Eyes: No scleral injection HENT: No OP obstrucion Head: Normocephalic.  Cardiovascular: Normal rate and regular rhythm.   Respiratory: Effort normal and breath sounds normal to anterior ascultation GI: Soft.  No distension. There is no tenderness.  Skin: WDI    Neurological Examination Mental Status: Somnolent, difficult to arouse,  Cranial Nerves: II: Visual fields grossly normal,  III,IV, VI: ptosis not present, extra-ocular motions intact bilaterally, pupils equal, round, reactive to light and accommodation VII: Face appears to be symmetric VIII: Unable to assess IX,X: Unable to  assess XI: unable to assess XII: midline tongue extension Motor: Withdraws in all 4 extremities  Tone and bulk:slightly increased tone in RUE Sensory: withdraws to pain bilaterally Deep Tendon Reflexes: 2+ and symmetric throughout Plantars: Right: downgoing   Left: downgoing Cerebellar: Unable to assess Gait: unable to assess     Lab Results: Basic Metabolic Panel: Recent Labs  Lab 03/14/19 2322 03/14/19 2353  NA 138  --   K 3.6  --   CL 106  --   CO2 26  --   GLUCOSE 161*  --   BUN 15  --   CREATININE 1.13* 1.10*  CALCIUM 8.5*  --     CBC: Recent Labs  Lab 03/14/19 2322  WBC 11.8*  NEUTROABS 10.6*  HGB 12.4  HCT 38.5  MCV 82.4  PLT 303    Coagulation Studies: Recent Labs    03/14/19 08-May-2321  LABPROT 13.5  INR 1.0    Imaging: Ct Head Wo Contrast  Result Date: 03/15/2019 CLINICAL DATA:  Initial evaluation for acute altered mental status, unresponsive. EXAM: CT HEAD WITHOUT CONTRAST TECHNIQUE: Contiguous axial images were obtained from the base of the skull through the vertex without intravenous contrast. COMPARISON:  Prior CT from 02/25/2015. FINDINGS: Brain: Advanced age-related cerebral atrophy with severe chronic microvascular ischemic disease, progressed relative to May 09, 2015. Superimposed chronic infarct noted at the posterior left frontal operculum. Small remote bilateral thalamic lacunar infarcts noted, similar to previous. No acute large vessel territory infarct. No acute intracranial  hemorrhage. No mass lesion, midline shift or mass effect. Diffuse ventricular prominence related to global parenchymal volume loss of hydrocephalus. No extra-axial fluid collection. Vascular: No hyperdense vessel. Calcified atherosclerosis present at the skull base. Skull: Scalp soft tissues demonstrate no acute finding. Calvarium intact. Sinuses/Orbits: Globes and orbital soft tissues demonstrate no acute finding. Paranasal sinuses are clear. Small moderate left mastoid effusion noted. Other: Degenerative changes noted within the visualized upper cervical spine. IMPRESSION: 1. No acute intracranial abnormality. 2. Advanced age-related cerebral atrophy with severe chronic microvascular ischemic disease, progressed relative to 05/09/2015. Electronically Signed   By: Rise Mu M.D.   On: 03/15/2019 00:39   Dg Chest Port 1 View  Result Date: 03/14/2019 CLINICAL DATA:  Shortness of breath. EXAM: PORTABLE CHEST 1 VIEW COMPARISON:  09/25/2015 FINDINGS: Unchanged heart size and mediastinal contours. Patchy right infrahilar opacity. Chronic interstitial coarsening is unchanged from prior. No pulmonary edema, large pleural effusion or pneumothorax. Bones are under mineralized. Scoliosis and degenerative change in the spine. Chronic degenerative change of both shoulders. IMPRESSION: Patchy right infrahilar opacity may be atelectasis or pneumonia. Given distribution, aspiration is considered. Electronically Signed   By: Narda Rutherford M.D.   On: 03/14/2019 23:57     I have reviewed the above imaging: reviewed CT, no acute findings     ASSESSMENT AND PLAN   85 y.o. female with past medical history of diabetes, coronary artery disease, CVA, CKD brought to ER for seizure like activity.   Acute encephalopathy  Seizure - load with 500mg  Keppra  - start 250mg  BID maintenance  - Seizure precuations - continue to treat Pneumonia  - EEG and MR Brain if patient not back to baseline tomorrow    Aanika Defoor [facility] Pager Number [phone]

## 2019-03-15 NOTE — Progress Notes (Signed)
Received pt from the ED around 0400, lethargic, sleepy, could not follow commands, placed on Tele and verified, unable to complete admission assessment due to her mental condition at the moment, monitoring continues, seizure precautions in place.

## 2019-03-15 NOTE — Progress Notes (Signed)
Same day chart review note  MRI - cerebellar stroke EEG - left sided slowing worse than right. No seizure  RECS: Will need stroke work up Decrease sedating meds as much as possible. Continue Keppra at 250mg  BID given presumption that there was some seizure-like activity and extensive white matter disease/damage from prior vascular events on brain MRI. Treatment of HCAP per primary team  Stroke team will follow.  -- Milon Dikes, MD [facility] Pager: [phone] If 7pm to 7am, please call on call as listed on AMION.

## 2019-03-16 ENCOUNTER — Inpatient Hospital Stay (HOSPITAL_COMMUNITY): Payer: Medicare Other

## 2019-03-16 LAB — CBC
HCT: 33.9 % — ABNORMAL LOW (ref 36.0–46.0)
Hemoglobin: 10.4 g/dL — ABNORMAL LOW (ref 12.0–15.0)
MCH: 25 pg — ABNORMAL LOW (ref 26.0–34.0)
MCHC: 30.7 g/dL (ref 30.0–36.0)
MCV: 81.5 fL (ref 80.0–100.0)
Platelets: 268 10*3/uL (ref 150–400)
RBC: 4.16 MIL/uL (ref 3.87–5.11)
RDW: 17.2 % — ABNORMAL HIGH (ref 11.5–15.5)
WBC: 12.7 10*3/uL — ABNORMAL HIGH (ref 4.0–10.5)
nRBC: 0 % (ref 0.0–0.2)

## 2019-03-16 LAB — GLUCOSE, CAPILLARY
GLUCOSE-CAPILLARY: 51 mg/dL — AB (ref 70–99)
GLUCOSE-CAPILLARY: 82 mg/dL (ref 70–99)
Glucose-Capillary: 70 mg/dL (ref 70–99)
Glucose-Capillary: 173 mg/dL — ABNORMAL HIGH (ref 70–99)

## 2019-03-16 LAB — HEMOGLOBIN A1C
Hgb A1c MFr Bld: 6 % — ABNORMAL HIGH (ref 4.8–5.6)
Mean Plasma Glucose: 125.5 mg/dL

## 2019-03-16 LAB — LIPID PANEL
Cholesterol: 164 mg/dL (ref 0–200)
HDL: 47 mg/dL (ref >40)
LDL Cholesterol: 107 mg/dL — ABNORMAL HIGH (ref 0–99)
Total CHOL/HDL Ratio: 3.5 RATIO
Triglycerides: 48 mg/dL (ref <150)
VLDL: 10 mg/dL (ref 0–40)

## 2019-03-16 LAB — HIV ANTIBODY (ROUTINE TESTING W REFLEX): HIV Screen 4th Generation wRfx: NONREACTIVE

## 2019-03-16 MED ORDER — LEVETIRACETAM 250 MG PO TABS: 250.0000 mg | ORAL_TABLET | Freq: Two times a day (BID) | ORAL | 1 refills | Status: AC

## 2019-03-16 MED ORDER — SODIUM CHLORIDE 0.9 % IV SOLN: 1.0000 g | INTRAVENOUS | Status: DC

## 2019-03-16 MED ORDER — DEXTROSE 50 % IV SOLN
INTRAVENOUS | Status: AC
  Administered 2019-03-16: 50 mL
  Filled 2019-03-16: qty 50

## 2019-03-16 MED ORDER — KCL IN DEXTROSE-NACL 10-5-0.45 MEQ/L-%-% IV SOLN
INTRAVENOUS | Status: DC
  Administered 2019-03-16: 09:00:00 via INTRAVENOUS
  Filled 2019-03-16 (×2): qty 1000

## 2019-03-16 MED ORDER — DOXYCYCLINE HYCLATE 100 MG PO TABS: 100.0000 mg | ORAL_TABLET | Freq: Two times a day (BID) | ORAL | 0 refills | Status: DC

## 2019-03-16 NOTE — TOC Initial Note (Signed)
Transition of Care [facility]) - Initial/Assessment Note    Patient Details  Name: [name] MRN: [id number] Date of Birth: 1920/01/08  Transition of Care [facility]) CM/SW Contact:    Baldemar Lenis, LCSW Phone Number: 03/16/2019, 12:05 PM  Clinical Narrative:  Patient long term at [facility], stable to return today. CSW left voicemail for legal guardian to notify her.                 Expected Discharge Plan: Long Term Nursing Home Barriers to Discharge: No Barriers Identified   Patient Goals and CMS Choice Patient states their goals for this hospitalization and ongoing recovery are:: patient unable to participate in goal setting CMS Medicare.gov Compare Post Acute Care list provided to:: Legal Guardian    Expected Discharge Plan and Services Expected Discharge Plan: Long Term Nursing Home     Post Acute Care Choice: Nursing Home Living arrangements for the past 2 months: Skilled Nursing Facility Expected Discharge Date: 03/16/19                        Prior Living Arrangements/Services Living arrangements for the past 2 months: Skilled Nursing Facility Lives with:: Self, Facility Resident Patient language and need for interpreter reviewed:: No Do you feel safe going back to the place where you live?: Yes      Need for Family Participation in Patient Care: Yes (Comment) Care giver support system in place?: Yes (comment)   Criminal Activity/Legal Involvement Pertinent to Current Situation/Hospitalization: No - Comment as needed  Activities of Daily Living Home Assistive Devices/Equipment: None ADL Screening (condition at time of admission) Patient's cognitive ability adequate to safely complete daily activities?: No Is the patient deaf or have difficulty hearing?: Yes Does the patient have difficulty seeing, even when wearing glasses/contacts?: Yes Does the patient have difficulty concentrating, remembering, or making decisions?: Yes Patient able to express need  for assistance with ADLs?: No Does the patient have difficulty dressing or bathing?: Yes Independently performs ADLs?: No Communication: Independent Dressing (OT): Dependent Is this a change from baseline?: Change from baseline, expected to last >3 days Grooming: Dependent Is this a change from baseline?: Change from baseline, expected to last >3 days Feeding: Dependent Is this a change from baseline?: Change from baseline, expected to last >3 days Bathing: Dependent Is this a change from baseline?: Change from baseline, expected to last >3 days Toileting: Dependent Is this a change from baseline?: Change from baseline, expected to last >3days In/Out Bed: Dependent Is this a change from baseline?: Change from baseline, expected to last >3 days Walks in Home: Dependent Is this a change from baseline?: Change from baseline, expected to last >3 days Does the patient have difficulty walking or climbing stairs?: Yes Weakness of Legs: Both Weakness of Arms/Hands: Both  Permission Sought/Granted Permission sought to share information with : Facility Medical sales representative, Guardian Permission granted to share information with : Yes, Verbal Permission Granted  Share Information with NAME: Colin Mulders  Permission granted to share info w AGENCY: [facility]  Permission granted to share info w Relationship: Legal guardian     Emotional Assessment Appearance:: Appears stated age Attitude/Demeanor/Rapport: Unable to Assess Affect (typically observed): Unable to Assess   Alcohol / Substance Use: Not Applicable Psych Involvement: No (comment)  Admission diagnosis:  Seizure (HCC) [R56.9] Pneumonia of right lung due to infectious organism, unspecified part of lung [J18.9] Patient Active Problem List   Diagnosis Date Noted  . Seizure (HCC) 03/15/2019  .  CVA (cerebral vascular accident) (HCC) 03/15/2019  . Chorioretinal scar 09/03/2018  . COPD with asthma (HCC) 08/28/2017  . History of  hyperthyroidism 07/08/2017  . Chronic bronchitis (HCC) 03/11/2017  . Urinary tract infection due to extended-spectrum beta lactamase (ESBL) producing Escherichia coli 01/23/2017  . Anxiety 03/09/2015  . 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 PSVT (paroxysmal supraventricular tachycardia) (HCC) 01/07/2015  . Constipation 12/27/2014  . Anemia, iron deficiency 10/06/2014  . Vascular dementia without behavioral disturbance (HCC) 07/18/2014  . Palpitations 08/17/2013  . Trigeminy 08/17/2013  . Toenail deformity 04/30/2013  . Insomnia 03/12/2012  . Mobility impaired 01/07/2012  . Diastolic CHF, chronic (HCC) 12/31/2011  . CAD (coronary artery disease) 12/28/2011  . Abnormality of gait 07/20/2009  . Osteoporosis 10/01/2007  . Essential hypertension 08/07/2007  . Pre-diabetes 07/16/2007  . C V A/STROKE 07/16/2007  . URINARY INCONTINENCE, URGE 07/16/2007  . HX, PERSONAL, THROMBOPHLEBITIS 07/16/2007  . Hyperlipidemia with target LDL less than 70 03/03/2007  . ARTHROPATHY NEC, SHOULDER 03/03/2007  . Osteoarthritis 03/03/1979   PCP:  Pecola Lawless, MD Pharmacy:   [facility] [address], [place] - [address] [place] [address] Phone: [phone] Fax: [phone]  [facility] [place], [place] - [address] [place] [address] Phone: [phone] Fax: [phone]     Social Determinants of Health (SDOH) Interventions    Readmission Risk Interventions No flowsheet data found.

## 2019-03-16 NOTE — TOC Transition Note (Signed)
Transition of Care [facility]) - CM/SW Discharge Note   Patient Details  Name: [name] MRN: [id number] Date of Birth: 01-Sep-1920  Transition of Care [facility]) CM/SW Contact:  Baldemar Lenis, LCSW Phone Number: 03/16/2019, 12:06 PM   Clinical Narrative:   Nurse to call report to [phone]    Final next level of care: Long Term Nursing Home Barriers to Discharge: No Barriers Identified   Patient Goals and CMS Choice Patient states their goals for this hospitalization and ongoing recovery are:: patient unable to participate in goal setting CMS Medicare.gov Compare Post Acute Care list provided to:: Legal Guardian    Discharge Placement              Patient [facility] bed at: [facility] Patient to be transferred to facility by: [facility] Name of family member notified: Brianna Patient and family notified of of transfer: 03/16/19  Discharge Plan and Services     Post Acute Care Choice: Nursing Home                    Social Determinants of Health (SDOH) Interventions     Readmission Risk Interventions No flowsheet data found.

## 2019-03-16 NOTE — Procedures (Signed)
Patient uncooperative at this time.  Will try echo some other time.

## 2019-03-16 NOTE — Evaluation (Signed)
Physical Therapy Evaluation Patient Details Name: [name] MRN: [id number] DOB: Aug 21, 1920 Today's Date: 03/16/2019   History of Present Illness  Patient is a 85 y/o female who presents from [place] with AMS. Admitted with seizures and encephalopathy. CXR-suspicious for PNA. MRI- right cerebellar stroke. EEG- left slowing. PMH includes CAD, COPD, CVA, DM, CHF, fibromyalgia, HTN, HLD and vascular dementia.  Clinical Impression  Patient presents with agitation, confusion and elevated HR s/p above. Pt verbally abusive towards therapists, yelling obscenities and attempting to spit. Not able to be redirected with tasks or distracted. Pt's HR ranged from 120-160s bpm during session. Pt observed moving all extremities spontaneously. Pt is from [place] and has a hx of dementia. Not sure of pt's PLOF as pt not able to provide this information. Will follow acutely for further assessment of functional mobility when behavior and cognition improve.     Follow Up Recommendations SNF    Equipment Recommendations  Other (comment)(defer)    Recommendations for Other Services       Precautions / Restrictions Precautions Precautions: Fall Precaution Comments: watch HR Restrictions Weight Bearing Restrictions: No      Mobility  Bed Mobility               General bed mobility comments: Deferred secondary to tachycardia- HR ranging from 120-160s bpm supine in bed and agitation/confusion. Getting worked up yelling at therapists.   Transfers                 General transfer comment: Deferred secondary to above.   Ambulation/Gait                Stairs            Wheelchair Mobility    Modified Rankin (Stroke Patients Only) Modified Rankin (Stroke Patients Only) Pre-Morbid Rankin Score: (unsure) Modified Rankin: (unsure)     Balance Overall balance assessment: (not formally assessed due to safety concerns and agitation)                                            Pertinent Vitals/Pain Pain Assessment: Faces Faces Pain Scale: No hurt    Home Living Family/patient expects to be discharged to:: Skilled nursing facility(From [facility])                      Prior Function           Comments: Pt unable to provide history or PLOF.     Hand Dominance        Extremity/Trunk Assessment   Upper Extremity Assessment Upper Extremity Assessment: Difficult to assess due to impaired cognition;Defer to OT evaluation(Observed moving BUEs spontaneously- but nothing to command due to impaired cognition and agitation. )    Lower Extremity Assessment Lower Extremity Assessment: Difficult to assess due to impaired cognition(Observed moving BUEs spontaneously- but nothing to command due to impaired cognition and agitation. )       Communication      Cognition Arousal/Alertness: Awake/alert Behavior During Therapy: Agitated Overall Cognitive Status: No family/caregiver present to determine baseline cognitive functioning                                 General Comments: Pt extremely agitated- verbally abusive to therapists throughout- "you B" "you slut" Trying to spit at  therapists, "come here," puckering her lips together getting ready to spit. Not able to be redirected despite multiple attempts.       General Comments General comments (skin integrity, edema, etc.): HR ranged from 120-160s bpm throughout in A-fib.     Exercises     Assessment/Plan    PT Assessment Patient needs continued PT services  PT Problem List Decreased balance;Decreased cognition;Decreased strength;Decreased mobility;Decreased safety awareness;Decreased activity tolerance;Cardiopulmonary status limiting activity       PT Treatment Interventions Functional mobility training;Balance training;Patient/family education;Gait training;Wheelchair mobility training;Therapeutic activities;Neuromuscular re-education;Therapeutic  exercise;Cognitive remediation;DME instruction    PT Goals (Current goals can be found in the Care Plan section)  Acute Rehab PT Goals Patient Stated Goal: "get the f out" PT Goal Formulation: Patient unable to participate in goal setting Time For Goal Achievement: 03/30/19 Potential to Achieve Goals: Fair    Frequency Min 3X/week   Barriers to discharge        Co-evaluation PT/OT/SLP Co-Evaluation/Treatment: Yes Reason for Co-Treatment: For patient/therapist safety PT goals addressed during session: Mobility/safety with mobility         AM-PAC PT "6 Clicks" Mobility  Outcome Measure Help needed turning from your back to your side while in a flat bed without using bedrails?: A Lot Help needed moving from lying on your back to sitting on the side of a flat bed without using bedrails?: A Lot Help needed moving to and from a bed to a chair (including a wheelchair)?: A Lot Help needed standing up from a chair using your arms (e.g., wheelchair or bedside chair)?: A Lot Help needed to walk in hospital room?: A Lot Help needed climbing 3-5 steps with a railing? : Total 6 Click Score: 11    End of Session   Activity Tolerance: Treatment limited secondary to medical complications (Comment);Treatment limited secondary to agitation(HR) Patient left: in bed;with call bell/phone within reach;with bed alarm set;with SCD's reapplied Nurse Communication: Mobility status;Other (comment)(not cooperative) PT Visit Diagnosis: Muscle weakness (generalized) (M62.81);Difficulty in walking, not elsewhere classified (R26.2)    Time: 1001-1010 PT Time Calculation (min) (ACUTE ONLY): 9 min   Charges:   PT Evaluation $PT Eval Moderate Complexity: 1 Mod          Mylo Red, PT, DPT Acute Rehabilitation Services Pager [phone] Office [phone]      Blake Divine A Lanier Ensign 03/16/2019, 11:21 AM

## 2019-03-16 NOTE — Discharge Summary (Addendum)
Physician Discharge Summary  [name] ENI:[id number] DOB: 08-23-1920 DOA: 03/14/2019  PCP: Pecola Lawless, MD  Admit date: 03/14/2019 Discharge date: 03/16/2019  Time spent: 45  minutes  Recommendations for Outpatient Follow-up:  1. Discharge to [facility] center. Plan is to transition to comfort care. Currently patient is DNR/DNI and DNH (do not hospitalize) according to NP at [facility].    Discharge Diagnoses:  Principal Problem:   Seizure [name]) Active Problems:   Diastolic CHF, chronic (HCC)   Essential hypertension   CAD (coronary artery disease)   Vascular dementia without behavioral disturbance (HCC)   COPD with asthma (HCC)   CVA (cerebral vascular accident) [facility])   Discharge Condition: stable at baseline  Diet recommendation: resume unable to do swallow eval. Aspiration precautions  Filed Weights   03/15/19 0200  Weight: 44.8 kg    History of present illness:  Patient is a 85 y/o female who presented from [facility] 03/15/19 with AMS. Admitted with seizures and encephalopathy. CXR-suspicious for PNA. MRI- right cerebellar stroke. EEG- left slowing. PMH includes CAD, COPD, CVA, DM, CHF, fibromyalgia, HTN, HLD and vascular dementia. Spoke to NP whitney at [name] who confirmed patient is DNR/DNI and DNH (do not hospitalize) and in process of transitioning to comfort care only.   Hospital Course:  1. Seizure like activity - patient awake alert, agitated and combative. Resisting all attempts to provide care. Shouting "go away".  EEG with left slowing. MRI reveals right cerebellar stroke.  Spoke with NP at facility to confirm goals of care. Currently patient DNR/DNI and DNH (do not hospitalize) with plan to transition to comfort care "if got worse". With acute stroke transition to comfort care is plan. No further stroke workup recommended.  Neuro saw patient in consult and recommended keppra 250mg  BID which will be continued at discharge. No further seizure activity.  Mag level 1.8 and tsh 1.06. 2. Apparent aspiration PNA in RLL on CXR -received rocephin and azithromycin for 2 days. Will discharge with doxy for 5 days due to allergies and limitations with compliance. She is afebrile and non-toxic appearing at discharge-- suspect she will continue to aspirate-- agree with transitioning to comfort 3. HTN, CAD, CHF, COPD, PSVT -fair control. Home meds include cardizem, lasix. Will resume home meds 4. Vascular dementia -at baseline at discharge. Alert combative uncooperative, agitated. NP at facility stated this is baseline.     Consultations:  neurology  Discharge Exam: Vitals:   03/16/19 0824 03/16/19 1005  BP: 128/65 105/74  Pulse: 98   Resp: 18 20  Temp: 98.7 F (37.1 C)   SpO2: 100% 96%    General: awake alert hitting and kicking Cardiovascular: tachycardia but regular no mgr no LE edema Respiratory: normal effort BS clear bilaterally no wheeze  Discharge Instructions   Discharge Instructions    Diet - low sodium heart healthy   Complete by:  As directed    Discharge instructions   Complete by:  As directed    Discharge to [name] Plan to transition to comfort care   Increase activity slowly   Complete by:  As directed      Allergies as of 03/16/2019      Reactions   Codeine Other (See Comments)   Pt states that it knocks her out.    Penicillins Rash   Hives, upset stomach      Medication List    TAKE these medications   acetaminophen 325 MG tablet Commonly known as:  TYLENOL Take 650 mg by mouth  every 8 (eight) hours as needed for mild pain.   aspirin 81 MG tablet Take 81 mg by mouth daily.   BIOFREEZE EX Apply topically 3 (three) times daily as needed (to knees and shoulders).   bisacodyl 10 MG suppository Commonly known as:  DULCOLAX Place 10 mg rectally daily as needed for mild constipation. Constipation (2 of 4): If not relieved by MOM, give 10 mg Bisacodyl suppositiory rectally X 1 dose in 24 hours as needed  (Do not use constipation standing orders for residents with renal failure/CFR less than 30. Contact MD for orders)   budesonide 0.25 MG/2ML nebulizer solution Commonly known as:  PULMICORT Take 0.25 mg by nebulization 2 (two) times daily.   diltiazem 90 MG tablet Commonly known as:  CARDIZEM Take 90 mg by mouth 2 (two) times daily.   doxycycline 100 MG tablet Commonly known as:  VIBRA-TABS Take 1 tablet (100 mg total) by mouth 2 (two) times daily for 5 days.   ferrous sulfate 325 (65 FE) MG tablet Take 325 mg by mouth daily with breakfast.   furosemide 20 MG tablet Commonly known as:  LASIX Take 20 mg by mouth every other day.   ipratropium-albuterol 0.5-2.5 (3) MG/3ML Soln Commonly known as:  DUONEB Take 3 mLs by nebulization every 4 (four) hours as needed (SOB).   levETIRAcetam 250 MG tablet Commonly known as:  Keppra Take 1 tablet (250 mg total) by mouth 2 (two) times daily.   magnesium hydroxide 400 MG/5ML suspension Commonly known as:  MILK OF MAGNESIA Take 30 mLs by mouth daily as needed for mild constipation. Constipation (1 of 4): If no BM in 3 days, give 30 cc Milk of Magnesium p.o. x 1 dose in 24 hours as needed (Do not use standing constipation orders for residents with renal failure CFR less than 30. Contact MD for orders)   Melatonin 3 MG Caps Take 3 mg by mouth at bedtime.   metoprolol tartrate 50 MG tablet Commonly known as:  LOPRESSOR Take 50 mg by mouth 2 (two) times daily. Hold for HR<60 or systolic >90   montelukast 10 MG tablet Commonly known as:  SINGULAIR Take 10 mg by mouth daily.   nitroGLYCERIN 0.4 MG SL tablet Commonly known as:  NITROSTAT Place 0.4 mg under the tongue every 5 (five) minutes as needed for chest pain.   Pataday 0.2 % Soln Generic drug:  Olopatadine HCl Place 1 drop into both eyes daily.   polyethylene glycol packet Commonly known as:  MIRALAX / GLYCOLAX Take 17 g by mouth daily.   RA Saline Enema 19-7 GM/118ML  Enem Place 1 each rectally as needed (for constipation).   sertraline 50 MG tablet Commonly known as:  ZOLOFT Take 50 mg by mouth daily.   Systane Balance 0.6 % Soln Generic drug:  Propylene Glycol Place 1 drop into both eyes 2 (two) times daily.   UNABLE TO FIND Take 1 each by mouth 2 (two) times daily. Med Name: Magic cup   UNABLE TO FIND Apply 1 application topically 3 (three) times daily. Med Name: Ativan gel 0.25 mg   UNABLE TO FIND Take 120 mLs by mouth 3 (three) times daily. Med Name: Med Pass   vitamin B-12 1000 MCG tablet Commonly known as:  CYANOCOBALAMIN Take 1,000 mcg by mouth every other day.      Allergies  Allergen Reactions  . Codeine Other (See Comments)    Pt states that it knocks her out.   [name] Penicillins Rash  Hives, upset stomach       The results of significant diagnostics from this hospitalization (including imaging, microbiology, ancillary and laboratory) are listed below for reference.    Significant Diagnostic Studies: Ct Head Wo Contrast  Result Date: 03/15/2019 CLINICAL DATA:  Initial evaluation for acute altered mental status, unresponsive. EXAM: CT HEAD WITHOUT CONTRAST TECHNIQUE: Contiguous axial images were obtained from the base of the skull through the vertex without intravenous contrast. COMPARISON:  Prior CT from 02/25/2015. FINDINGS: Brain: Advanced age-related cerebral atrophy with severe chronic microvascular ischemic disease, progressed relative to 2016. Superimposed chronic infarct noted at the posterior left frontal operculum. Small remote bilateral thalamic lacunar infarcts noted, similar to previous. No acute large vessel territory infarct. No acute intracranial hemorrhage. No mass lesion, midline shift or mass effect. Diffuse ventricular prominence related to global parenchymal volume loss of hydrocephalus. No extra-axial fluid collection. Vascular: No hyperdense vessel. Calcified atherosclerosis present at the skull base. Skull:  Scalp soft tissues demonstrate no acute finding. Calvarium intact. Sinuses/Orbits: Globes and orbital soft tissues demonstrate no acute finding. Paranasal sinuses are clear. Small moderate left mastoid effusion noted. Other: Degenerative changes noted within the visualized upper cervical spine. IMPRESSION: 1. No acute intracranial abnormality. 2. Advanced age-related cerebral atrophy with severe chronic microvascular ischemic disease, progressed relative to 2016. Electronically Signed   By: Rise MuBenjamin  McClintock M.D.   On: 03/15/2019 00:39   Mr Brain Wo Contrast  Result Date: 03/15/2019 CLINICAL DATA:  Altered mental status.  Possible seizure. EXAM: MRI HEAD WITHOUT CONTRAST TECHNIQUE: Multiplanar, multiecho pulse sequences of the brain and surrounding structures were obtained without intravenous contrast. COMPARISON:  CT head earlier today. FINDINGS: The patient was unable to remain motionless for the exam. Small or subtle lesions could be overlooked. Brain: Advanced atrophy. Extensive T2 and FLAIR hyperintensities throughout the white matter, likely chronic microvascular ischemic change. Small area of restricted diffusion, corresponding low ADC, RIGHT superior cerebellar cortex and white matter, consistent with acute infarction. No hemorrhage, mass lesion, hydrocephalus, or extra-axial fluid. There is insufficient detail due to motion to assess for a subtle temporal lobe abnormality on coronal imaging. Vascular: Normal flow voids. Skull and upper cervical spine: Normal marrow signal. Sinuses/Orbits: Negative. Other: None. IMPRESSION: Significant motion degradation. Advanced atrophy with extensive small vessel disease. Small acute infarct RIGHT superior cerebellum. No epileptogenic focus is evident. Electronically Signed   By: Elsie StainJohn T Curnes M.D.   On: 03/15/2019 12:36   Dg Chest Port 1 View  Result Date: 03/14/2019 CLINICAL DATA:  Shortness of breath. EXAM: PORTABLE CHEST 1 VIEW COMPARISON:  09/25/2015  FINDINGS: Unchanged heart size and mediastinal contours. Patchy right infrahilar opacity. Chronic interstitial coarsening is unchanged from prior. No pulmonary edema, large pleural effusion or pneumothorax. Bones are under mineralized. Scoliosis and degenerative change in the spine. Chronic degenerative change of both shoulders. IMPRESSION: Patchy right infrahilar opacity may be atelectasis or pneumonia. Given distribution, aspiration is considered. Electronically Signed   By: Narda RutherfordMelanie  Sanford M.D.   On: 03/14/2019 23:57   Mr Shirlee LatchMra Head WUWo Contrast  Result Date: 03/15/2019 CLINICAL DATA:  85 year old female with small acute infarct in the posterior right cerebellum on MRI earlier today. EXAM: MRA HEAD WITHOUT CONTRAST TECHNIQUE: Angiographic images of the Circle of Willis were obtained using MRA technique without intravenous contrast. COMPARISON:  Brain MRI and head CT earlier today. FINDINGS: Study is intermittently degraded by motion artifact despite repeated imaging attempts. Poor detail of the distal right vertebral artery. The distal left vertebral artery appears patent.  The basilar artery is patent. The basilar tip, SCA and PCA origins appear patent. PCA branch detail degraded by motion. Both ICA siphons appear patent. Both carotid termini, MCA and ACA origins are patent. Both MCA M1 segments appear patent. Poor bilateral MCA branch detail. ACA A1 segments, anterior communicating artery common proximal 82 segments appear patent and within normal limits. Poor distal ACA branch detail. IMPRESSION: 1. Substantially motion degraded despite repeated imaging attempts. 2. Subsequently, the distal right vertebral artery is not evaluated here, but appear grossly patent on the earlier MRI. 3. No other intracranial large vessel occlusion. Electronically Signed   By: Odessa Fleming M.D.   On: 03/15/2019 21:25    Microbiology: Recent Results (from the past 240 hour(s))  MRSA PCR Screening     Status: None   Collection  Time: 03/15/19  4:21 AM  Result Value Ref Range Status   MRSA by PCR NEGATIVE NEGATIVE Final    Comment:        The GeneXpert MRSA Assay (FDA approved for NASAL specimens only), is one component of a comprehensive MRSA colonization surveillance program. It is not intended to diagnose MRSA infection nor to guide or monitor treatment for MRSA infections. Performed at [facility], [address], [place], [place] [address]      Labs: Basic Metabolic Panel: Recent Labs  Lab 03/14/19 2322 03/14/19 2353 03/15/19 1228 03/15/19 1235  NA 138  --  142  --   K 3.6  --  3.8  --   CL 106  --  105  --   CO2 26  --  26  --   GLUCOSE 161*  --  91  --   BUN 15  --  12  --   CREATININE 1.13* 1.10* 0.95  --   CALCIUM 8.5*  --  8.9  --   MG  --   --   --  1.8   Liver Function Tests: Recent Labs  Lab 03/14/19 2322  AST 23  ALT 18  ALKPHOS 76  BILITOT 0.6  PROT 6.9  ALBUMIN 3.2*   No results for input(s): LIPASE, AMYLASE in the last 168 hours. No results for input(s): AMMONIA in the last 168 hours. CBC: Recent Labs  Lab 03/14/19 2322 03/16/19 0808  WBC 11.8* 12.7*  NEUTROABS 10.6*  --   HGB 12.4 10.4*  HCT 38.5 33.9*  MCV 82.4 81.5  PLT 303 268   Cardiac Enzymes: No results for input(s): CKTOTAL, CKMB, CKMBINDEX, TROPONINI in the last 168 hours. BNP: BNP (last 3 results) No results for input(s): BNP in the last 8760 hours.  ProBNP (last 3 results) No results for input(s): PROBNP in the last 8760 hours.  CBG: Recent Labs  Lab 03/15/19 0545 03/16/19 0035 03/16/19 0408 03/16/19 0821 03/16/19 0911  GLUCAP 102* 82 70 51* 173*       Signed:  Gwenyth Bender NP [facility] 03/16/2019, 11:20 AM  Patient was seen, examined,treatment plan was discussed with the Advance Practice Provider.  I have personally reviewed the clinical findings, labs, EKG, imaging studies and management of this patient in detail. I have also reviewed the orders written for  this patient which were under my direction. I agree with the documentation, as recorded by the Advance Practice Provider.   NOHA MILBERGER is a 85 y.o. female here with a new CVA.  Based on patient's advance directives for DO NOT HOSPITALIZE will not pursue a stroke work up, as she is transitioning  to comfort care per NP at [facility].  Will continue keppra for comfort measures.  Will treat PNA for now but suspect aspiration and most likely will continue to happen.     Joseph Art, DO  How to contact the [facility] Attending or Consulting provider 7A - 7P or covering provider during after hours 7P -7A, for this patient?  1. Check the care team in [facility] and look for a) attending/consulting TRH provider listed and b) the [facility] team listed 2. Log into [email or web address] and use [facility]'s universal password to access. If you do not have the password, please contact the hospital operator. 3. Locate the [facility] provider you are looking for under [facility] and page to a number that you can be directly reached. 4. If you still have difficulty reaching the provider, please page the [facility] (Director on Call) for the Hospitalists listed on amion for assistance.

## 2019-03-16 NOTE — Evaluation (Signed)
Occupational Therapy Evaluation Patient Details Name: [name] MRN: [id number] DOB: 1920-01-09 Today's Date: 03/16/2019    History of Present Illness Patient is a 85 y/o female who presents from [place] with [place]. Admitted with seizures and encephalopathy. CXR-suspicious for PNA. MRI- right cerebellar stroke. EEG- left slowing. PMH includes CAD, COPD, CVA, DM, CHF, fibromyalgia, HTN, HLD.    Clinical Impression   Patient from [facility], admitted for above and presents with agitation, confusion and increased HR.  Pt yelling obscenities and verbally abusive during session, even attempting to spit; attempted redirection and command following but unable to redirect.  Pt able to move all 4 extremities during session, from bed level.  Limited assessment due to agitation and cognition. Unable to obtain PLOF, but based on performance today no further OT needs identified acutely and all needs can be met at next venue of care (SNF).  OT will sign off.     Follow Up Recommendations  SNF;Supervision/Assistance - 24 hour    Equipment Recommendations  None recommended by OT    Recommendations for Other Services       Precautions / Restrictions Precautions Precautions: Fall Precaution Comments: watch HR Restrictions Weight Bearing Restrictions: No      Mobility Bed Mobility               General bed mobility comments: deferred due to safety, agitation, and tachycardia; HR ranging from 120-160 supine in bed  Transfers                 General transfer comment: deferred due to safety, agitation and tachycardia     Balance Overall balance assessment: (not formally assessed due to safety concerns and agitation)                                         ADL either performed or assessed with clinical judgement   ADL Overall ADL's : Needs assistance/impaired                                       General ADL Comments: unclear  baseline, but at this time requires total assist for all self care      Vision   Additional Comments: unable to assess     Perception     Praxis      Pertinent Vitals/Pain Pain Assessment: Faces Faces Pain Scale: No hurt     Hand Dominance     Extremity/Trunk Assessment Upper Extremity Assessment Upper Extremity Assessment: Difficult to assess due to impaired cognition(observed moving B UEs during session, but not to commands )   Lower Extremity Assessment Lower Extremity Assessment: Defer to PT evaluation       Communication     Cognition Arousal/Alertness: Awake/alert Behavior During Therapy: Agitated Overall Cognitive Status: No family/caregiver present to determine baseline cognitive functioning                                 General Comments: Pt extremely agitated- verbally abusive to therapists throughout- "you B" "you slut" Trying to spit at therapists, "come here," puckering her lips together getting ready to spit. Not able to be redirected despite multiple attempts.    General Comments  HR ranged from 120-160 throughout in A-fib  Exercises     Shoulder Instructions      Home Living Family/patient expects to be discharged to:: Skilled nursing facility(from [facility])                                        Prior Functioning/Environment          Comments: Pt unable to provide history or PLOF.        OT Problem List: Decreased strength;Decreased activity tolerance;Decreased safety awareness;Decreased knowledge of use of DME or AE;Decreased cognition      OT Treatment/Interventions:      OT Goals(Current goals can be found in the care plan section) Acute Rehab OT Goals Patient Stated Goal: none stated OT Goal Formulation: Patient unable to participate in goal setting  OT Frequency:     Barriers to D/C:            Co-evaluation PT/OT/SLP Co-Evaluation/Treatment: Yes Reason for Co-Treatment: For  patient/therapist safety PT goals addressed during session: Mobility/safety with mobility OT goals addressed during session: ADL's and self-care      AM-PAC OT "6 Clicks" Daily Activity     Outcome Measure Help from another person eating meals?: Total Help from another person taking care of personal grooming?: Total Help from another person toileting, which includes using toliet, bedpan, or urinal?: Total Help from another person bathing (including washing, rinsing, drying)?: Total Help from another person to put on and taking off regular upper body clothing?: Total Help from another person to put on and taking off regular lower body clothing?: Total 6 Click Score: 6   End of Session Nurse Communication: Mobility status  Activity Tolerance: Treatment limited secondary to agitation Patient left: in bed;with bed alarm set;with call bell/phone within reach  OT Visit Diagnosis: Other abnormalities of gait and mobility (R26.89)                Time: 1001-1010 OT Time Calculation (min): 9 min Charges:  OT General Charges $OT Visit: 1 Visit OT Evaluation $OT Eval Low Complexity: 1 Low  Delight Stare, OT Acute Rehabilitation Services Pager [phone] Office [phone]    Delight Stare 03/16/2019, 12:04 PM

## 2019-03-16 NOTE — Plan of Care (Signed)
Adequate for discharge.

## 2019-03-16 NOTE — [place] FL2 (Signed)
[place] MEDICAID FL2 LEVEL OF CARE SCREENING TOOL     IDENTIFICATION  Patient Name: [name] Birthdate: 1920-11-12 Sex: female Admission Date (Current Location): 03/14/2019  [facility] and [place] Number:  Producer, television/film/video and Address:  [facility], [address] [place], [place] [address]      Provider Number: [id number]  Attending Physician Name and Address:  Joseph Art, DO  Relative Name and Phone Number:       Current Level of Care: Hospital Recommended Level of Care: Skilled Nursing Facility Prior Approval Number:    Date Approved/Denied:   PASRR Number:    Discharge Plan: SNF    Current Diagnoses: Patient Active Problem List   Diagnosis Date Noted  . Seizure (HCC) 03/15/2019  . CVA (cerebral vascular accident) (HCC) 03/15/2019  . Chorioretinal scar 09/03/2018  . COPD with asthma (HCC) 08/28/2017  . History of hyperthyroidism 07/08/2017  . Chronic bronchitis (HCC) 03/11/2017  . Urinary tract infection due to extended-spectrum beta lactamase (ESBL) producing Escherichia coli 01/23/2017  . Anxiety 03/09/2015  . 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 PSVT (paroxysmal supraventricular tachycardia) (HCC) 01/07/2015  . Constipation 12/27/2014  . Anemia, iron deficiency 10/06/2014  . Vascular dementia without behavioral disturbance (HCC) 07/18/2014  . Palpitations 08/17/2013  . Trigeminy 08/17/2013  . Toenail deformity 04/30/2013  . Insomnia 03/12/2012  . Mobility impaired 01/07/2012  . Diastolic CHF, chronic (HCC) 12/31/2011  . CAD (coronary artery disease) 12/28/2011  . Abnormality of gait 07/20/2009  . Osteoporosis 10/01/2007  . Essential hypertension 08/07/2007  . Pre-diabetes 07/16/2007  . C V A/STROKE 07/16/2007  . URINARY INCONTINENCE, URGE 07/16/2007  . HX, PERSONAL, THROMBOPHLEBITIS 07/16/2007  . Hyperlipidemia with target LDL less than 70 03/03/2007  . ARTHROPATHY NEC, SHOULDER  03/03/2007  . Osteoarthritis 03/03/1979    Orientation RESPIRATION BLADDER Height & Weight     (disoriented)  Normal Incontinent Weight: 98 lb 12.3 oz (44.8 kg) Height:  5\' 3"  (160 cm)  BEHAVIORAL SYMPTOMS/MOOD NEUROLOGICAL BOWEL NUTRITION STATUS  Verbally abusive Convulsions/Seizures Incontinent Diet(see DC summary)  AMBULATORY STATUS COMMUNICATION OF NEEDS Skin   Total Care Verbally Normal                       Personal Care Assistance Level of Assistance  Bathing, Feeding, Dressing Bathing Assistance: Maximum assistance Feeding assistance: Maximum assistance Dressing Assistance: Maximum assistance     Functional Limitations Info  Sight, Hearing, Speech Sight Info: Adequate Hearing Info: Adequate Speech Info: Adequate    SPECIAL CARE FACTORS FREQUENCY                       Contractures Contractures Info: Not present    Additional Factors Info  Code Status, Allergies, Psychotropic Code Status Info: DNR Allergies Info: Codeine, Penicillins Psychotropic Info: Zoloft 50mg  daily         Current Medications (03/16/2019):  This is the current hospital active medication list Current Facility-Administered Medications  Medication Dose Route Frequency Provider Last Rate Last Dose  . acetaminophen (TYLENOL) suppository 650 mg  650 mg Rectal Q6H PRN Schorr, Roma Kayser, NP      . aspirin EC tablet 81 mg  81 mg Oral Daily Schorr, Roma Kayser, NP   Stopped at 03/15/19 2309  . bisacodyl (DULCOLAX) suppository 10 mg  10 mg Rectal Daily PRN Schorr, Roma Kayser, NP      . budesonide (PULMICORT) nebulizer solution 0.25 mg  0.25 mg Nebulization BID Schorr, Roma Kayser, NP   0.25 mg at 03/15/19 2207  . [START ON 03/17/2019] cefTRIAXone (ROCEPHIN) 1 g in sodium chloride 0.9 % 100 mL IVPB  1 g Intravenous Q24H Vann, Jessica U, DO      . dextrose 5 % and 0.45 % NaCl with KCl 10 mEq/L infusion   Intravenous Continuous Marlin Canary U, DO 50 mL/hr at 03/16/19 0919    . diltiazem  (CARDIZEM) tablet 90 mg  90 mg Oral BID Schorr, Roma Kayser, NP   Stopped at 03/15/19 2309  . enoxaparin (LOVENOX) injection 20 mg  20 mg Subcutaneous Q24H Schorr, Roma Kayser, NP   20 mg at 03/15/19 1707  . ferrous sulfate tablet 325 mg  325 mg Oral Q breakfast Schorr, Roma Kayser, NP      . furosemide (LASIX) tablet 20 mg  20 mg Oral QODAY Schorr, Katherine P, NP      . ipratropium-albuterol (DUONEB) 0.5-2.5 (3) MG/3ML nebulizer solution 3 mL  3 mL Nebulization Q4H PRN Schorr, Roma Kayser, NP      . levETIRAcetam (KEPPRA) 250 mg in sodium chloride 0.9 % 100 mL IVPB  250 mg Intravenous Q12H Schorr, Roma Kayser, NP   Stopped at 03/16/19 0000  . magnesium hydroxide (MILK OF MAGNESIA) suspension 30 mL  30 mL Oral Daily PRN Schorr, Roma Kayser, NP      . metoprolol tartrate (LOPRESSOR) tablet 50 mg  50 mg Oral BID Schorr, Roma Kayser, NP   Stopped at 03/15/19 2310  . montelukast (SINGULAIR) tablet 10 mg  10 mg Oral QHS Schorr, Roma Kayser, NP   Stopped at 03/15/19 2310  . nitroGLYCERIN (NITROSTAT) SL tablet 0.4 mg  0.4 mg Sublingual Q5 min PRN Schorr, Roma Kayser, NP      . polyethylene glycol (MIRALAX / GLYCOLAX) packet 17 g  17 g Oral Daily Schorr, Roma Kayser, NP      . polyvinyl alcohol (LIQUIFILM TEARS) 1.4 % ophthalmic solution 1 drop  1 drop Both Eyes BID Schorr, Roma Kayser, NP   Stopped at 03/15/19 2310  . sertraline (ZOLOFT) tablet 50 mg  50 mg Oral Daily Schorr, Roma Kayser, NP   Stopped at 03/15/19 2310  . sodium phosphate (FLEET) 7-19 GM/118ML enema   Rectal PRN Schorr, Roma Kayser, NP      . vitamin B-12 (CYANOCOBALAMIN) tablet 1,000 mcg  1,000 mcg Oral QODAY Schorr, Roma Kayser, NP         Discharge Medications: Please see discharge summary for a list of discharge medications.  Relevant Imaging Results:  Relevant Lab Results:   Additional Information SS#: [id number]  Baldemar Lenis, LCSW

## 2019-03-16 NOTE — Progress Notes (Addendum)
NP came in and woke patient up the patient was very aggressive and wouldn't cooraperate, attempted to do yale swallow screen but patient would not follow any requests

## 2019-03-17 ENCOUNTER — Non-Acute Institutional Stay: Payer: Medicare Other | Admitting: Adult Health Nurse Practitioner

## 2019-03-17 ENCOUNTER — Other Ambulatory Visit: Payer: Self-pay

## 2019-03-17 DIAGNOSIS — Z515 Encounter for palliative care: Secondary | ICD-10-CM

## 2019-03-17 NOTE — Progress Notes (Signed)
Therapist, nutritional Palliative Care Consult Note Telephone: [phone]  Fax: [phone]  PATIENT NAME: [name] DOB: 1920-04-04 MRN: [id number]  PRIMARY CARE PROVIDER:   Pecola Lawless, MD  REFERRING PROVIDER:  Dorena Bodo NP  RESPONSIBLE PARTY:   Guardian: [name] [phone] [facility]  [phone] (adult foster care on-call SW [phone])     RECOMMENDATIONS and PLAN:  1. Seizure/stroke.  Patient had recent hospitalization, 3/22-3/24/2020, for seizure activity.  EEG showed left slowing and MRI did show right cerebellar stroke.  She does not appear to have any deficits related to the stroke.  Staff does report that when providing care she can be combative and she is really strong when she gets combative.  She was started on Keppra 250 mg BID with no further seizure activity.  Continue to monitor for any further seizures.  2. Dementia.  FAST 7b though she is able to sit up in wheelchair with support from posture cushion.  Patient is combative with staff.  She is being seen by psych provider.  Currently on zoloft 50 mg daily and Ativan gel 0.25 mg TID.  Continue recommendations by psych provider.  Patient does not eat well and has Medpass ordered TID. She was also treated for aspiration pneumonia while at the hospital. She is on puree diet with thin liquids.  Patient weighed 116 on 05/03/2018 and dropped down to 100 in 07/2018.  Since then her weight has fluctuated from 98-104.  She currently weighs 102 with a BMI of 17.75.  Albumin on 03/14/2019 was 3.2 and total protein was 6.9.  Continue puree diet and supplemental nutrition.  No recent weight loss but patient has low BMI, may consider remeron for appetite stimulant.  3. Goals of care.  Patient is listed as DNR/DNI/DNH.  When patient sent out to hospital there was some confusion on who to call as she is a ward of the state.  [name] is her guardian and  her number is listed above.  Spoke with Ms. Moore by phone and she did clarify that her DNR/DNH/DNI is only in the case of when her heart stops beating and she is unresponsive.  Otherwise she would like everything done that can be done and that if the patient does not improve after that she wants comfort measures for the patient.  I spent 30 minutes providing this consultation,  from 9:45 to 10:15. More than 50% of the time in this consultation was spent coordinating communication.   HISTORY OF PRESENT ILLNESS:  [name] is a 85 y.o. year old female with multiple medical problems including vascular dementia, CAD, COPD, CF, HTN, CVA, seizures. Palliative Care was asked to help address goals of care.   CODE STATUS: DNR  PPS: 40% HOSPICE ELIGIBILITY/DIAGNOSIS: TBD  PHYSICAL EXAM:   General: NAD, frail appearing, thin Cardiovascular: regular rate and rhythm, 2/6 systolic murmur heard Pulmonary: lung sounds somewhat diminished, more on right side.  Normal respiratory effort Abdomen: soft, nontender, + bowel sounds GU: no suprapubic tenderness Extremities: no edema Skin: no rashes Neurological: Weakness, A&O to self only.  Patient speaks but mostly does not follow conversation.  Repeats a few of the same words.  Does not follow commands well.  Patient was pleasant with provider.     PAST MEDICAL HISTORY:  Past Medical History:  Diagnosis Date  . Altered mental status 01/01/2011   Qualifier: Diagnosis of  By: Earnest Bailey MD, Selena Batten    .  Anemia   . Anxiety 03/09/2015  . Arthritis   . Asthma   . CAD (coronary artery disease)    a. reports prior h/o MI's with possible cath @ [facility] in [place] 'many years ago.'  . CAROTID ARTERY DISEASE 07/16/2007   a. 07/2012 U/S: no signif extracranial carotid artery stenosis, antegrad vertebral flow.  . CKD (chronic kidney disease), stage II   . COPD with asthma (HCC)   . CVA (cerebral vascular accident) (HCC)    a. She's not sure that this is true.   [name] DEFICIENCY, VITAMIN D NOS 07/16/2007   Qualifier: Diagnosis of  By: Irving Burton MD, Clifton Custard    . Diabetes mellitus, type 2 (HCC)   . Diastolic CHF, chronic (HCC)    a. 07/2013 Echo: EF 55-60%.  . Fibromyalgia   . H/O hiatal hernia   . Headache(784.0)   . High risk social situations 11/28/2011  . HLD (hyperlipidemia)   . HTN (hypertension)   . Hyperthyroidism   . Intention tremor 03/03/2007   Qualifier: Diagnosis of  By: Dorathy Daft  MD, IDAYLIS    . Neuromuscular disorder (HCC)   . Palpitations    a. 8.2014 Echo: EF 55-60%, no rwma, mod LVH, mildly dil LA, PASP .  [name] Recurrent upper respiratory infection (URI)   . Seizures (HCC)   . Syncope, near 02/28/2012  . Systolic murmur    a. no significant valvular abnormalities on echo 8.2014.  . TOBACCO USER   . Urinary incontinence   . Vitamin D deficiency     SOCIAL HX:  Social History   Tobacco Use  . Smoking status: Former Smoker    Packs/day: 0.50    Types: Cigarettes    Last attempt to quit: 04/06/2012    Years since quitting: 6.9  . Smokeless tobacco: Never Used  . Tobacco comment: hx unreliable due to dementia, possibly 60 pack years  Substance Use Topics  . Alcohol use: No    ALLERGIES:  Allergies  Allergen Reactions  . Codeine Other (See Comments)    Pt states that it knocks her out.   [name] Penicillins Rash    Hives, upset stomach      PERTINENT MEDICATIONS:  Outpatient Encounter Medications as of 03/17/2019  Medication Sig  . acetaminophen (TYLENOL) 325 MG tablet Take 650 mg by mouth every 8 (eight) hours as needed for mild pain.  [name] aspirin 81 MG tablet Take 81 mg by mouth daily.   . bisacodyl (DULCOLAX) 10 MG suppository Place 10 mg rectally daily as needed for mild constipation. Constipation (2 of 4): If not relieved by MOM, give 10 mg Bisacodyl suppositiory rectally X 1 dose in 24 hours as needed (Do not use constipation standing orders for residents with renal failure/CFR less than 30. Contact MD for orders)    . budesonide (PULMICORT) 0.25 MG/2ML nebulizer solution Take 0.25 mg by nebulization 2 (two) times daily.  [name] diltiazem (CARDIZEM) 90 MG tablet Take 90 mg by mouth 2 (two) times daily.  [name] doxycycline (VIBRA-TABS) 100 MG tablet Take 1 tablet (100 mg total) by mouth 2 (two) times daily for 5 days.  . ferrous sulfate 325 (65 FE) MG tablet Take 325 mg by mouth daily with breakfast.   . furosemide (LASIX) 20 MG tablet Take 20 mg by mouth every other day.   . ipratropium-albuterol (DUONEB) 0.5-2.5 (3) MG/3ML SOLN Take 3 mLs by nebulization every 4 (four) hours as needed (SOB).   [name] levETIRAcetam (KEPPRA) 250 MG tablet Take 1  tablet (250 mg total) by mouth 2 (two) times daily.  . magnesium hydroxide (MILK OF MAGNESIA) 400 MG/5ML suspension Take 30 mLs by mouth daily as needed for mild constipation. Constipation (1 of 4): If no BM in 3 days, give 30 cc Milk of Magnesium p.o. x 1 dose in 24 hours as needed (Do not use standing constipation orders for residents with renal failure CFR less than 30. Contact MD for orders)   . Melatonin 3 MG CAPS Take 3 mg by mouth at bedtime.  . Menthol, Topical Analgesic, (BIOFREEZE EX) Apply topically 3 (three) times daily as needed (to knees and shoulders).  . metoprolol (LOPRESSOR) 50 MG tablet Take 50 mg by mouth 2 (two) times daily. Hold for HR<60 or systolic >90  . montelukast (SINGULAIR) 10 MG tablet Take 10 mg by mouth daily.  . nitroGLYCERIN (NITROSTAT) 0.4 MG SL tablet Place 0.4 mg under the tongue every 5 (five) minutes as needed for chest pain.  [name] Olopatadine HCl (PATADAY) 0.2 % SOLN Place 1 drop into both eyes daily.   . polyethylene glycol (MIRALAX / GLYCOLAX) packet Take 17 g by mouth daily.  [name] Propylene Glycol (SYSTANE BALANCE) 0.6 % SOLN Place 1 drop into both eyes 2 (two) times daily.  . sertraline (ZOLOFT) 50 MG tablet Take 50 mg by mouth daily.  . Sodium Phosphates (RA SALINE ENEMA) 19-7 GM/118ML ENEM Place 1 each rectally as needed (for constipation).  [name]  UNABLE TO FIND Take 1 each by mouth 2 (two) times daily. Med Name: Magic cup  . UNABLE TO FIND Apply 1 application topically 3 (three) times daily. Med Name: Ativan gel 0.25 mg  . UNABLE TO FIND Take 120 mLs by mouth 3 (three) times daily. Med Name: Med Pass  . vitamin B-12 (CYANOCOBALAMIN) 1000 MCG tablet Take 1,000 mcg by mouth every other day.   . [DISCONTINUED] albuterol (PROVENTIL) (2.5 MG/3ML) 0.083% nebulizer solution Take 3 mLs (2.5 mg total) by nebulization every 6 (six) hours as needed for wheezing.   No facility-administered encounter medications on file as of 03/17/2019.       Bensyn Bornemann Marlena Clipper, NP

## 2019-03-18 ENCOUNTER — Encounter: Payer: Self-pay | Admitting: Internal Medicine

## 2019-03-18 ENCOUNTER — Non-Acute Institutional Stay (SKILLED_NURSING_FACILITY): Payer: Medicare Other | Admitting: Internal Medicine

## 2019-03-18 DIAGNOSIS — J690 Pneumonitis due to inhalation of food and vomit: Secondary | ICD-10-CM | POA: Insufficient documentation

## 2019-03-18 DIAGNOSIS — R569 Unspecified convulsions: Secondary | ICD-10-CM

## 2019-03-18 DIAGNOSIS — F01518 Vascular dementia, unspecified severity, with other behavioral disturbance: Secondary | ICD-10-CM

## 2019-03-18 DIAGNOSIS — F0151 Vascular dementia with behavioral disturbance: Secondary | ICD-10-CM

## 2019-03-18 NOTE — Progress Notes (Deleted)
   NURSING HOME LOCATION:  [facility] ROOM NUMBER: 301/B   CODE STATUS: DNR   PCP:  Pecola Lawless MD.   This is a comprehensive admission note to [facility] performed on this date less than 30 days from date of admission. Included are preadmission medical/surgical history; reconciled medication list; family history; social history and comprehensive review of systems.  Corrections and additions to the records were documented. Comprehensive physical exam was also performed. Additionally a clinical summary was entered for each active diagnosis pertinent to this admission in the Problem List to enhance continuity of care.  HPI:  Past medical and surgical history:  Social history:  Family history:   Review of systems:  Could not be completed due to dementia. Date given as Constitutional: No fever, significant weight change, fatigue  Eyes: No redness, discharge, pain, vision change ENT/mouth: No nasal congestion, purulent discharge, earache, change in hearing, sore throat  Cardiovascular: No chest pain, palpitations, paroxysmal nocturnal dyspnea, claudication, edema  Respiratory: No cough, sputum production, hemoptysis, DOE, significant snoring, apnea Gastrointestinal: No heartburn, dysphagia, abdominal pain, nausea /vomiting, rectal bleeding, melena, change in bowels Genitourinary: No dysuria, hematuria, pyuria, incontinence, nocturia Musculoskeletal: No joint stiffness, joint swelling, weakness, pain Dermatologic: No rash, pruritus, change in appearance of skin Neurologic: No dizziness, headache, syncope, seizures, numbness, tingling Psychiatric: No significant anxiety, depression, insomnia, anorexia Endocrine: No change in hair/skin/nails, excessive thirst, excessive hunger, excessive urination  Hematologic/lymphatic: No significant bruising, lymphadenopathy, abnormal bleeding Allergy/immunology: No itchy/watery eyes, significant sneezing, urticaria, angioedema   Physical exam:  Pertinent or positive findings: General appearance: Adequately nourished; no acute distress, increased work of breathing is present.   Lymphatic: No lymphadenopathy about the head, neck, axilla. Eyes: No conjunctival inflammation or lid edema is present. There is no scleral icterus. Ears:  External ear exam shows no significant lesions or deformities.   Nose:  External nasal examination shows no deformity or inflammation. Nasal mucosa are pink and moist without lesions, exudates Oral exam: Lips and gums are healthy appearing.There is no oropharyngeal erythema or exudate. Neck:  No thyromegaly, masses, tenderness noted.    Heart:  Normal rate and regular rhythm. S1 and S2 normal without gallop, murmur, click, rub.  Lungs: Chest clear to auscultation without wheezes, rhonchi, rales, rubs. Abdomen: Bowel sounds are normal.  Abdomen is soft and nontender with no organomegaly, hernias, masses. GU: Deferred  Extremities:  No cyanosis, clubbing, edema. Neurologic exam:  Strength equal  in upper & lower extremities. Balance, Rhomberg, finger to nose testing could not be completed due to clinical state Deep tendon reflexes are equal Skin: Warm & dry w/o tenting. No significant lesions or rash.  See clinical summary under each active problem in the Problem List with associated updated therapeutic plan

## 2019-03-18 NOTE — Assessment & Plan Note (Signed)
Pure diet with thin liquids Speech therapy to monitor @ SNF

## 2019-03-18 NOTE — Assessment & Plan Note (Addendum)
3/22-3/24/ 2020 hospitalized with seizures & possible aspiration pneumonia.  Patient was combative and resisted any interventions & treatments DNR/DNI/DNH

## 2019-03-18 NOTE — Progress Notes (Signed)
NURSING HOME LOCATION:  [facility] ROOM NUMBER: 301/B   CODE STATUS:  DNR  PCP:  Pecola Lawless MD.   This is a nursing facility follow up for Nursing Facility readmission within 30 days  Interim medical record and care since last [facility] visit was updated with review of diagnostic studies and change in clinical status since last visit were documented.  HPI: She was rehospitalized 3/22-3/24/2020 with seizure and possible aspiration pneumonia suspected on portable  chest x-ray. She received Rocephin and azithromycin for 48 hours.  EEG revealed slowing on the left; MRI revealed a right cerebellar stroke.  She was noncompliant with interventions exhibiting combative behavior which has continued since her readmission to the SNF.  This is in the context of vascular dementia.  She was placed on Keppra 250 mg twice a day with resolution of the seizure activity.  She has been noted to have poor p.o. intake and Med Pass 3 times a day was ordered along with a pure and thin liquid diet because of the possible aspiration phenomena.  The poor intake has been associated with weight loss; recently weight has varied from a low of 98 up to high 104.  She is exhibited a total protein of 6.9 and albumin of 3.2. Palliative Care recommended transition to comfort care.  She has an appointed guardian: Present designation is DNR/DNI and DNH.  She was discharged on doxycycline for 5 days.   Review of systems: The patient was completely noninteractive at this time.  She sits in the wheelchair with her eyes closed and arms clasped across her thorax.  Most striking finding is the fact she is overdressed.  She is wearing a quilt coat and hat despite an ambient temperature of 71 in the room.  It is noted that her TSH was therapeutic while hospitalized.  Physical exam:  Pertinent or positive findings: Clinically she appeared to be sleeping but did resist exam.  Specifically she kept her eyes tightly  closed as well as her jaw clenched.  She exhibited a harsh systolic murmur at the left base.  Breath sounds were decreased.  Bowel sounds were also decreased.  Pedal pulses were nonpalpable.  She has anti-wandering bracelet at the right ankle.  When her arms and lower extremities were lifted she did exhibit adequate tone and lowered them back rather than limbs dropping limply.  She did exhibit limb atrophy as well as interosseous wasting.  General appearance:  no acute distress, increased work of breathing is present.   Lymphatic: No lymphadenopathy about the head, neck, axilla. Eyes: No conjunctival inflammation or lid edema is present. There is no scleral icterus. Ears:  External ear exam shows no significant lesions or deformities.   Nose:  External nasal examination shows no deformity or inflammation. Nasal mucosa are pink and moist without lesions, exudates Oral exam:  Lips and gums are healthy appearing. There is no oropharyngeal erythema or exudate. Neck:  No thyromegaly, masses, tenderness noted.    Heart:  Normal rate and regular rhythm. S1 and S2 normal without gallop, click, rub .  Lungs: without wheezes, rhonchi, rales, rubs. Abdomen:  Abdomen is soft and nontender with no organomegaly, hernias, masses. GU: Deferred  Extremities:  No cyanosis, clubbing, edema  Neurologic exam :Balance, Rhomberg, finger to nose testing could not be completed due to clinical state Skin: Warm & dry w/o tenting. No significant lesions or rash.  See summary under each active problem in the Problem List with associated updated therapeutic  plan

## 2019-03-18 NOTE — Patient Instructions (Signed)
See assessment and plan under each diagnosis in the problem list and acutely for this visit 

## 2019-03-19 NOTE — Assessment & Plan Note (Signed)
No recurrent seizure activity on Keppra

## 2019-03-20 LAB — CULTURE, BLOOD (ROUTINE X 2)
Culture: NO GROWTH
Culture: NO GROWTH
Special Requests: ADEQUATE

## 2019-06-16 ENCOUNTER — Non-Acute Institutional Stay (SKILLED_NURSING_FACILITY): Payer: Medicare Other | Admitting: Internal Medicine

## 2019-06-16 ENCOUNTER — Encounter: Payer: Self-pay | Admitting: Internal Medicine

## 2019-06-16 DIAGNOSIS — F01518 Vascular dementia, unspecified severity, with other behavioral disturbance: Secondary | ICD-10-CM

## 2019-06-16 DIAGNOSIS — J410 Simple chronic bronchitis: Secondary | ICD-10-CM

## 2019-06-16 DIAGNOSIS — E785 Hyperlipidemia, unspecified: Secondary | ICD-10-CM

## 2019-06-16 DIAGNOSIS — D509 Iron deficiency anemia, unspecified: Secondary | ICD-10-CM

## 2019-06-16 DIAGNOSIS — F0151 Vascular dementia with behavioral disturbance: Secondary | ICD-10-CM

## 2019-06-16 NOTE — Progress Notes (Signed)
   NURSING HOME LOCATION:  [facility] ROOM NUMBER:  [id number]  CODE STATUS:  DNR  PCP:  Hendricks Limes, MD  [place] [address]   This is a nursing facility follow up of chronic medical diagnoses.  Interim medical record and care since last [place] visit was updated with review of diagnostic studies and change in clinical status since last visit were documented.  HPI: This 85 year old female is a permanent resident of the facility with medical diagnoses of hypertension, history of hypothyroidism, dyslipidemia, diastolic congestive heart failure, diabetes, history of stroke, COPD with bronchospastic component, CKD stage II, CAD, and vascular dementia. Diabetes has been well controlled as manifested by an A1c of 6% which is actually in the prediabetic range.  A1c is current but would be due for repeat in next month.  Her LDL was mildly elevated 107 but aggressive statin therapy has not been pursued because of her advanced age.   She has also exhibited a slightly progressive anemia.  In March of this year her hemoglobin dropped from 12.4-10.4 in the context of hospitalization with seizures and possible aspiration pneumonia.  Indices were microcytic.  She is on iron supplementation.  Staff does not report any active bleeding dyscrasias. She quit smoking in 2013.  Review of systems: Dementia precluded completion.  She denied any active symptoms other than "eye".  When I asked her what her eye problem was & which eye was involved she could not tell me.  She could not tell me how old she is.  Physical exam:  Pertinent or positive findings: She was initially asleep when I entered her room to examine her.  She could be aroused but remained lethargic. She appears frail & suboptimally nourished. Left nasolabial fold is decreased.  She has complete dentures; these were coated.  Breath sounds were decreased.  I cannot appreciate rhonchi or rales which are typically present  on exam.  Grade 1 systolic murmur is present.  Second heart sound is increased.  Pedal pulses are not palpable.  She is wearing an anti-wandering bracelet on the right ankle.  She is weak in all extremities but there is asymmetry.  The legs are stronger to opposition than the upper extremities.  Interosseous wasting of the hands is present.  She has many small hyperpigmented keratoses over the trunk and neck.  General appearance:  no acute distress, increased work of breathing is present.   Lymphatic: No lymphadenopathy about the head, neck, axilla. Eyes: No conjunctival inflammation or lid edema is present. There is no scleral icterus. Ears:  External ear exam shows no significant lesions or deformities.   Nose:  External nasal examination shows no deformity or inflammation. Nasal mucosa are pink and moist without lesions, exudates Oral exam:  Lips and gums are healthy appearing. There is no oropharyngeal erythema or exudate. Neck:  No thyromegaly, masses, tenderness noted.    Heart:  Normal rate and regular rhythm. S1  normal without gallop, click, rub .  Abdomen: Bowel sounds are normal. Abdomen is soft and nontender with no organomegaly, hernias, masses. GU: Deferred  Extremities:  No cyanosis, clubbing, edema  Neurologic exam : Balance, Rhomberg, finger to nose testing could not be completed due to clinical state Skin: Warm & dry w/o tenting. No significant lesions or rash.  See summary under each active problem in the Problem List with associated updated therapeutic plan

## 2019-06-16 NOTE — Assessment & Plan Note (Signed)
She is receiving iron supplementation; no bleeding dyscrasias reported.  Aggressive pursuit of the anemia will not be conducted because of her advanced age and multiple comorbidities unless there is an acute change.

## 2019-06-16 NOTE — Assessment & Plan Note (Signed)
Most recent LDL 107 is acceptable in view of her age of 24.  Statin would not be stopped as she has had strokes.

## 2019-06-16 NOTE — Assessment & Plan Note (Signed)
Behavioral issues are not major; the patient simply prefers to be left to her self.

## 2019-06-16 NOTE — Assessment & Plan Note (Addendum)
Pulmonary status stable to improved on present pulmonary toilet with nebulized bronchodilators

## 2019-06-18 NOTE — Patient Instructions (Signed)
See assessment and plan under each diagnosis in the problem list and acutely for this visit 

## 2019-09-06 ENCOUNTER — Non-Acute Institutional Stay (SKILLED_NURSING_FACILITY): Payer: Medicare Other | Admitting: Internal Medicine

## 2019-09-06 ENCOUNTER — Encounter: Payer: Self-pay | Admitting: Internal Medicine

## 2019-09-06 DIAGNOSIS — F0151 Vascular dementia with behavioral disturbance: Secondary | ICD-10-CM | POA: Diagnosis not present

## 2019-09-06 DIAGNOSIS — R7303 Prediabetes: Secondary | ICD-10-CM

## 2019-09-06 DIAGNOSIS — F01518 Vascular dementia, unspecified severity, with other behavioral disturbance: Secondary | ICD-10-CM

## 2019-09-06 NOTE — Patient Instructions (Signed)
See assessment and plan under each diagnosis in the problem list and acutely for this visit 

## 2019-09-06 NOTE — Assessment & Plan Note (Signed)
A1c can be rechecked once quarantine is lifted.  Urine microalbumin is not clinically indicated at this time.

## 2019-09-06 NOTE — Assessment & Plan Note (Addendum)
In the past she has allow me to complete my examination.  She confabulates and is disoriented x3.  Today she is combative refusing to allow me to complete exam.  Psych NP consult will be pursued if she is deemed clinically of danger to herself or others.

## 2019-09-06 NOTE — Progress Notes (Signed)
  NURSING HOME LOCATION:  [facility] ROOM NUMBER:  [id number]  CODE STATUS:  DNR  PCP:  Hendricks Limes, MD  [place] [address]   This is a nursing facility follow up of chronic medical diagnoses.  Interim medical record and care since last [place] visit was updated with review of diagnostic studies and change in clinical status since last visit were documented.  HPI: She is a permanent resident of the facility with diagnoses of PSVT, essential hypertension, chronic diastolic heart failure, history of CVA, chronic asthmatic bronchitis, prediabetes, and vascular dementia. Labs are not current because of the corona pandemic quarantine.  Quality measure indicates that she is due for an A1c and urine microalbumin; but this value was 6%., down from a prior value of 6.2% in March of this year.  Glucoses are not being checked.  Review of systems: Dementia and agitation prevented completion of review of systems.  Physical exam:  Pertinent or positive findings: When I entered the room she was eating her pured diet.  She refused to be examined and would only state ' I wanna go home".  When I asked her where she lived, she told me it was "none of your damn business".  Her countenance was a constant glaring gaze & frown. She is markedly hard of hearing. She has bilateral ptosis.  She would not let me examine her mouth.  Grade 1 systolic murmur is suggested although she was talking during my attempt to auscultate the heart.  She has minor rales.  Pedal pulses are decreased.  She has exfoliative changes over the lower extremities.  Toenails are thickened. During my attempts to examine her she did not move the right upper extremity at all.  Staff reports that she does have normal function of the right arm.  General appearance: Adequately nourished; no acute distress, increased work of breathing is present.   Eyes: No conjunctival inflammation or lid edema is present. There  is no scleral icterus. Ears:  External ear exam shows no significant lesions or deformities.   Nose:  External nasal examination shows no deformity or inflammation. Nasal mucosa are pink and moist without lesions, exudates Abdomen: Bowel sounds are normal. Abdomen is soft and nontender with no organomegaly, hernias, masses. Balance, Rhomberg, finger to nose testing could not be completed due to clinical state Skin: Warm & dry w/o tenting. No significant lesions or rash.  See summary under each active problem in the Problem List with associated updated therapeutic plan

## 2019-09-07 ENCOUNTER — Encounter: Payer: Self-pay | Admitting: Internal Medicine

## 2019-09-26 ENCOUNTER — Encounter (HOSPITAL_COMMUNITY): Payer: Self-pay

## 2019-09-26 ENCOUNTER — Emergency Department (HOSPITAL_COMMUNITY): Payer: Medicare Other

## 2019-09-26 ENCOUNTER — Emergency Department (HOSPITAL_COMMUNITY)
Admission: EM | Admit: 2019-09-26 | Discharge: 2019-09-26 | Disposition: A | Discharge status: Skilled Nursing Facility | Payer: Medicare Other | Attending: Emergency Medicine | Admitting: Emergency Medicine

## 2019-09-26 DIAGNOSIS — Z87891 Personal history of nicotine dependence: Secondary | ICD-10-CM | POA: Diagnosis not present

## 2019-09-26 DIAGNOSIS — S0083XA Contusion of other part of head, initial encounter: Secondary | ICD-10-CM | POA: Diagnosis not present

## 2019-09-26 DIAGNOSIS — F0390 Unspecified dementia without behavioral disturbance: Secondary | ICD-10-CM | POA: Diagnosis not present

## 2019-09-26 DIAGNOSIS — I130 Hypertensive heart and chronic kidney disease with heart failure and stage 1 through stage 4 chronic kidney disease, or unspecified chronic kidney disease: Secondary | ICD-10-CM | POA: Diagnosis not present

## 2019-09-26 DIAGNOSIS — N182 Chronic kidney disease, stage 2 (mild): Secondary | ICD-10-CM | POA: Insufficient documentation

## 2019-09-26 DIAGNOSIS — S0990XA Unspecified injury of head, initial encounter: Secondary | ICD-10-CM | POA: Diagnosis present

## 2019-09-26 DIAGNOSIS — I5032 Chronic diastolic (congestive) heart failure: Secondary | ICD-10-CM | POA: Insufficient documentation

## 2019-09-26 DIAGNOSIS — Y92128 Other place in nursing home as the place of occurrence of the external cause: Secondary | ICD-10-CM | POA: Diagnosis not present

## 2019-09-26 DIAGNOSIS — Y9389 Activity, other specified: Secondary | ICD-10-CM | POA: Diagnosis not present

## 2019-09-26 DIAGNOSIS — Z7982 Long term (current) use of aspirin: Secondary | ICD-10-CM | POA: Insufficient documentation

## 2019-09-26 DIAGNOSIS — E1122 Type 2 diabetes mellitus with diabetic chronic kidney disease: Secondary | ICD-10-CM | POA: Insufficient documentation

## 2019-09-26 DIAGNOSIS — W010XXA Fall on same level from slipping, tripping and stumbling without subsequent striking against object, initial encounter: Secondary | ICD-10-CM

## 2019-09-26 DIAGNOSIS — Y999 Unspecified external cause status: Secondary | ICD-10-CM | POA: Diagnosis not present

## 2019-09-26 DIAGNOSIS — I10 Essential (primary) hypertension: Secondary | ICD-10-CM

## 2019-09-26 DIAGNOSIS — Z79899 Other long term (current) drug therapy: Secondary | ICD-10-CM | POA: Insufficient documentation

## 2019-09-26 DIAGNOSIS — W01198A Fall on same level from slipping, tripping and stumbling with subsequent striking against other object, initial encounter: Secondary | ICD-10-CM | POA: Insufficient documentation

## 2019-09-26 NOTE — Discharge Instructions (Addendum)
It was our pleasure to provide your ER care today - we hope that you feel better.  Fall precautions.  Ice/coldpack to sore area.   Take acetaminophen as need.   Your blood pressure is high  - continue blood pressure medication and follow up with primary care doctor this week.   Return to ER if worse, new symptoms, persistent vomiting, new or severe pain, or other concern.

## 2019-09-26 NOTE — ED Triage Notes (Signed)
Pt presents from [facility] with c/o fall that occurred today, unwitnessed. Staff found patient on the floor. Per EMS, pt has a knot to the right forehead. Pt has dementia. Pt is alert to person per EMS.

## 2019-09-26 NOTE — ED Provider Notes (Signed)
[facility]-EMERGENCY DEPT Provider Note   CSN: [id number] Arrival date & time: 09/26/19  7829     History   Chief Complaint Chief Complaint  Patient presents with   Fall    HPI [name] is a 85 y.o. female.     Patient s/p fall at [facility]. Witness fall, roommate called for help, patient not on floor for long period. No loc. Large contusion to forehead. Patient with hx dementia, limited historian, level 5 caveat. Pt notes pain to forehead and neck area. No numbness/weakness. No nausea/vomiting. Denies other pain or injury.  The history is provided by the patient and the EMS personnel. The history is limited by the condition of the patient.  Fall Pertinent negatives include no chest pain, no abdominal pain and no shortness of breath.    Past Medical History:  Diagnosis Date   Altered mental status 01/01/2011   Qualifier: Diagnosis of  By: Earnest Bailey MD, Kim     Anemia    Anxiety 03/09/2015   Arthritis    Asthma    CAD (coronary artery disease)    a. reports prior h/o MI's with possible cath @ [facility] in [place] 'many years ago.'   CAROTID ARTERY DISEASE 07/16/2007   a. 07/2012 U/S: no signif extracranial carotid artery stenosis, antegrad vertebral flow.   CKD (chronic kidney disease), stage II    COPD with asthma (HCC)    CVA (cerebral vascular accident) (HCC)    a. She's not sure that this is true.   DEFICIENCY, VITAMIN D NOS 07/16/2007   Qualifier: Diagnosis of  By: Irving Burton MD, Clifton Custard     Diabetes mellitus, type 2 (HCC)    Diastolic CHF, chronic (HCC)    a. 07/2013 Echo: EF 55-60%.   Fibromyalgia    H/O hiatal hernia    Headache(784.0)    High risk social situations 11/28/2011   HLD (hyperlipidemia)    HTN (hypertension)    Hyperthyroidism    Intention tremor 03/03/2007   Qualifier: Diagnosis of  By: Dorathy Daft  MD, IDAYLIS     Neuromuscular disorder (HCC)    Palpitations    a. 8.2014 Echo: EF 55-60%, no rwma, mod  LVH, mildly dil LA, PASP .   Recurrent upper respiratory infection (URI)    Seizures (HCC)    Syncope, near 02/28/2012   Systolic murmur    a. no significant valvular abnormalities on echo 8.2014.   TOBACCO USER    Urinary incontinence    Vitamin D deficiency     Patient Active Problem List   Diagnosis Date Noted   Aspiration pneumonia (HCC) 03/18/2019   Seizure (HCC) 03/15/2019   CVA (cerebral vascular accident) (HCC) 03/15/2019   Chorioretinal scar 09/03/2018   COPD with asthma (HCC) 08/28/2017   History of hyperthyroidism 07/08/2017   Chronic bronchitis (HCC) 03/11/2017   Urinary tract infection due to extended-spectrum beta lactamase (ESBL) producing Escherichia coli 01/23/2017   Anxiety 03/09/2015   Wandering in diseases classified elsewhere 03/09/2015   PSVT (paroxysmal supraventricular tachycardia) (HCC) 01/07/2015   Constipation 12/27/2014   Anemia, iron deficiency 10/06/2014   Vascular dementia with behavioral disturbance (HCC) 07/18/2014   Palpitations 08/17/2013   Trigeminy 08/17/2013   Toenail deformity 04/30/2013   Insomnia 03/12/2012   Mobility impaired 01/07/2012   Diastolic CHF, chronic (HCC) 12/31/2011   CAD (coronary artery disease) 12/28/2011   Abnormality of gait 07/20/2009   Osteoporosis 10/01/2007   Essential hypertension 08/07/2007   Pre-diabetes 07/16/2007   C V  A/STROKE 07/16/2007   URINARY INCONTINENCE, URGE 07/16/2007   HX, PERSONAL, THROMBOPHLEBITIS 07/16/2007   Hyperlipidemia with target LDL less than 70 03/03/2007   ARTHROPATHY NEC, SHOULDER 03/03/2007   Osteoarthritis 03/03/1979    Past Surgical History:  Procedure Laterality Date   APPENDECTOMY  1965   KNEE SURGERY  1989, 1995   VAGINAL HYSTERECTOMY  1965     OB History   No obstetric history on file.      Home Medications    Prior to Admission medications   Medication Sig Start Date End Date Taking? Authorizing Provider    acetaminophen (TYLENOL) 325 MG tablet Take 650 mg by mouth every 8 (eight) hours as needed for mild pain or fever.    [provider]  aspirin 81 MG tablet Take 81 mg by mouth daily.     [provider]  bisacodyl (DULCOLAX) 10 MG suppository Place 10 mg rectally daily as needed for mild constipation. Constipation (2 of 4): If not relieved by MOM, give 10 mg Bisacodyl suppositiory rectally X 1 dose in 24 hours as needed (Do not use constipation standing orders for residents with renal failure/CFR less than 30. Contact MD for orders)     [provider]  budesonide (PULMICORT) 0.25 MG/2ML nebulizer solution Take 0.25 mg by nebulization 2 (two) times daily.    [provider]  Cholecalciferol 1.25 MG (50000 UT) capsule Take 50,000 Units by mouth every 30 (thirty) days.     [provider]  diltiazem (CARDIZEM) 90 MG tablet Take 90 mg by mouth 2 (two) times daily.    [provider]  donepezil (ARICEPT) 5 MG tablet Take 5 mg by mouth at bedtime.    [provider]  ferrous sulfate 325 (65 FE) MG tablet Take 325 mg by mouth daily with breakfast.     [provider]  furosemide (LASIX) 20 MG tablet Take 20 mg by mouth every other day.     [provider]  levETIRAcetam (KEPPRA) 250 MG tablet Take 1 tablet (250 mg total) by mouth 2 (two) times daily. 03/16/19   Black, Lesle ChrisKaren M, NP  LORazepam (ATIVAN) 2 MG/ML concentrated solution 0.5 mg 3 (three) times daily. Apply topically to skin - hold for sedation.    [provider]  magnesium hydroxide (MILK OF MAGNESIA) 400 MG/5ML suspension Take 30 mLs by mouth daily as needed for mild constipation. Constipation (1 of 4): If no BM in 3 days, give 30 cc Milk of Magnesium p.o. x 1 dose in 24 hours as needed (Do not use standing constipation orders for residents with renal failure CFR less than 30. Contact MD for orders)     [provider]  Melatonin 3 MG CAPS Take 3 mg by  mouth at bedtime.    [provider]  metoprolol (LOPRESSOR) 50 MG tablet Take 50 mg by mouth 2 (two) times daily. Hold for HR<60 or systolic >90    [provider]  montelukast (SINGULAIR) 10 MG tablet Take 10 mg by mouth daily.     [provider]  nitroGLYCERIN (NITROSTAT) 0.4 MG SL tablet Place 0.4 mg under the tongue every 5 (five) minutes as needed for chest pain.    [provider]  Nutritional Supplement LIQD Take 120 mLs by mouth 3 (three) times daily. MedPass    [provider]  Nutritional Supplements (NUTRITIONAL SUPPLEMENT PO) Take 1 each by mouth 2 (two) times daily. Manufacturing engineerMagic Cup    [provider]  Olopatadine HCl (PATADAY) 0.2 % SOLN Place 1 drop into both eyes daily.     [provider]  polyethylene glycol (MIRALAX / GLYCOLAX) packet Take 17 g by mouth daily.    [provider]  Propylene Glycol (SYSTANE BALANCE) 0.6 % SOLN Place 1 drop into both eyes 2 (two) times daily.    [provider]  sertraline (ZOLOFT) 50 MG tablet Take 50 mg by mouth daily.    [provider]  Sodium Phosphates (RA SALINE ENEMA) 19-7 GM/118ML ENEM Place 1 each rectally as needed (for constipation).    [provider]  vitamin B-12 (CYANOCOBALAMIN) 1000 MCG tablet Take 1,000 mcg by mouth every other day.     [provider]  albuterol (PROVENTIL) (2.5 MG/3ML) 0.083% nebulizer solution Take 3 mLs (2.5 mg total) by nebulization every 6 (six) hours as needed for wheezing. 11/28/11 11/27/12  Hairford, Tyler Pita, MD    Family History Family History  Problem Relation Age of Onset   Diabetes Mother    Diabetes Brother    Stroke Father     Social History Social History   Tobacco Use   Smoking status: Former Smoker    Packs/day: 0.50    Types: Cigarettes    Quit date: 04/06/2012    Years since quitting: 7.4   Smokeless tobacco: Never Used   Tobacco comment: hx unreliable due to dementia,  possibly 60 pack years  Substance Use Topics   Alcohol use: No   Drug use: No     Allergies   Codeine and Penicillins   Review of Systems Review of Systems  Constitutional: Negative for fever.  HENT: Negative for nosebleeds.   Eyes: Negative for redness.  Respiratory: Negative for shortness of breath.   Cardiovascular: Negative for chest pain.  Gastrointestinal: Negative for abdominal pain and vomiting.  Genitourinary: Negative for flank pain.  Musculoskeletal: Negative for back pain.  Skin: Negative for wound.  Neurological: Negative for weakness and numbness.  Hematological: Does not bruise/bleed easily.  Psychiatric/Behavioral:       Alert, mental status reported as c/w baseline.      Physical Exam Updated Vital Signs BP (!) 200/114 (BP Location: Left Arm)    Pulse 67    Temp (!) 97.5 F (36.4 C) (Oral)    Resp 20    SpO2 98%   Physical Exam Vitals signs and nursing note reviewed.  Constitutional:      Appearance: Normal appearance. She is well-developed.  HENT:     Head:     Comments: Large contusion to right forehead.     Nose: Nose normal.     Mouth/Throat:     Mouth: Mucous membranes are moist.  Eyes:     General: No scleral icterus.    Conjunctiva/sclera: Conjunctivae normal.     Pupils: Pupils are equal, round, and reactive to light.  Neck:     Musculoskeletal: Normal range of motion and neck supple. No neck rigidity or muscular tenderness.     Trachea: No tracheal deviation.  Cardiovascular:     Rate and Rhythm: Normal rate and regular rhythm.     Pulses: Normal pulses.     Heart sounds: Normal heart sounds. No murmur. No friction rub. No gallop.   Pulmonary:     Effort: Pulmonary effort is normal. No respiratory distress.     Breath sounds: Normal breath sounds.  Chest:     Chest wall: No tenderness.  Abdominal:     General: Bowel sounds  are normal. There is no distension.     Palpations: Abdomen is soft.     Tenderness: There is no  abdominal tenderness. There is no guarding.  Genitourinary:    Comments: No cva tenderness.  Musculoskeletal:        General: No swelling.     Comments: CTLS spine, non tender, aligned, no step off. Tenderness mid c spine, otherwise, CTLS spine, non tender, aligned, no step off. Good rom bil extremities without pain or focal bony tenderness.   Skin:    General: Skin is warm and dry.     Findings: No rash.  Neurological:     Mental Status: She is alert.     Comments: Alert, speech normal. Motor intact bil, stre 5/5. sens grossly intact bil.   Psychiatric:        Mood and Affect: Mood normal.      ED Treatments / Results  Labs (all labs ordered are listed, but only abnormal results are displayed) Labs Reviewed - No data to display  EKG None  Radiology Ct Head Wo Contrast  Result Date: 09/26/2019 CLINICAL DATA:  Head trauma, status post fall yesterday EXAM: CT HEAD WITHOUT CONTRAST CT CERVICAL SPINE WITHOUT CONTRAST TECHNIQUE: Multidetector CT imaging of the head and cervical spine was performed following the standard protocol without intravenous contrast. Multiplanar CT image reconstructions of the cervical spine were also generated. COMPARISON:  None. FINDINGS: CT HEAD FINDINGS Brain: No evidence of acute infarction, hemorrhage, extra-axial collection, ventriculomegaly, or mass effect. Generalized cerebral atrophy. Periventricular white matter low attenuation likely secondary to microangiopathy. Vascular: Cerebrovascular atherosclerotic calcifications are noted. Skull: Negative for fracture or focal lesion. Sinuses/Orbits: Visualized portions of the orbits are unremarkable. Visualized portions of the paranasal sinuses and mastoid air cells are unremarkable. Other: Large right frontal scalp hematoma. CT CERVICAL SPINE FINDINGS Alignment: Normal. Skull base and vertebrae: No acute fracture. No primary bone lesion or focal pathologic process. Soft tissues and spinal canal: No prevertebral  fluid or swelling. No visible canal hematoma. Disc levels: Degenerative disc disease with disc height loss at C2-3, C3-4, C4-5, C5-6, and C6-7. Osseous fusion across the disc space at C7-T1. Left foraminal narrowing at C5-6 and C6-7. Upper chest: Left apical scarring. Other: No fluid collection or hematoma. Bilateral carotid artery atherosclerosis. IMPRESSION: 1. No acute intracranial pathology. 2.  No acute osseous injury of the cervical spine. 3. Large right frontal scalp hematoma. Electronically Signed   By: Elige Ko   On: 09/26/2019 12:00   Ct Cervical Spine Wo Contrast  Result Date: 09/26/2019 CLINICAL DATA:  Head trauma, status post fall yesterday EXAM: CT HEAD WITHOUT CONTRAST CT CERVICAL SPINE WITHOUT CONTRAST TECHNIQUE: Multidetector CT imaging of the head and cervical spine was performed following the standard protocol without intravenous contrast. Multiplanar CT image reconstructions of the cervical spine were also generated. COMPARISON:  None. FINDINGS: CT HEAD FINDINGS Brain: No evidence of acute infarction, hemorrhage, extra-axial collection, ventriculomegaly, or mass effect. Generalized cerebral atrophy. Periventricular white matter low attenuation likely secondary to microangiopathy. Vascular: Cerebrovascular atherosclerotic calcifications are noted. Skull: Negative for fracture or focal lesion. Sinuses/Orbits: Visualized portions of the orbits are unremarkable. Visualized portions of the paranasal sinuses and mastoid air cells are unremarkable. Other: Large right frontal scalp hematoma. CT CERVICAL SPINE FINDINGS Alignment: Normal. Skull base and vertebrae: No acute fracture. No primary bone lesion or focal pathologic process. Soft tissues and spinal canal: No prevertebral fluid or swelling. No visible canal hematoma. Disc levels: Degenerative disc disease with disc  height loss at C2-3, C3-4, C4-5, C5-6, and C6-7. Osseous fusion across the disc space at C7-T1. Left foraminal narrowing at  C5-6 and C6-7. Upper chest: Left apical scarring. Other: No fluid collection or hematoma. Bilateral carotid artery atherosclerosis. IMPRESSION: 1. No acute intracranial pathology. 2.  No acute osseous injury of the cervical spine. 3. Large right frontal scalp hematoma. Electronically Signed   By: Elige Ko   On: 09/26/2019 12:00    Procedures Procedures (including critical care time)  Medications Ordered in ED Medications - No data to display   Initial Impression / Assessment and Plan / ED Course  I have reviewed the triage vital signs and the nursing notes.  Pertinent labs & imaging results that were available during my care of the patient were reviewed by me and considered in my medical decision making (see chart for details).  Imaging ordered.   Reviewed nursing notes and prior charts for additional history.   icepack to sore area.   Acetaminophen po. Po fluids.  CTs reviewed by me - no acute hemorrhage.   Patient is alert, content, smiling, no distress or apparent pain, and appears stable for d/c.   Fall precautions.  Return precautions provided.     Final Clinical Impressions(s) / ED Diagnoses   Final diagnoses:  None    ED Discharge Orders    None       Cathren Laine, MD 09/26/19 1244

## 2019-09-26 NOTE — ED Notes (Signed)
[facility] called for transport.  
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
## 2019-11-23 ENCOUNTER — Encounter: Payer: Self-pay | Admitting: Internal Medicine

## 2019-11-23 ENCOUNTER — Non-Acute Institutional Stay (SKILLED_NURSING_FACILITY): Payer: Medicare Other | Admitting: Internal Medicine

## 2019-11-23 DIAGNOSIS — R569 Unspecified convulsions: Secondary | ICD-10-CM | POA: Diagnosis not present

## 2019-11-23 DIAGNOSIS — F01518 Vascular dementia, unspecified severity, with other behavioral disturbance: Secondary | ICD-10-CM

## 2019-11-23 DIAGNOSIS — R627 Adult failure to thrive: Secondary | ICD-10-CM | POA: Diagnosis not present

## 2019-11-23 DIAGNOSIS — F0151 Vascular dementia with behavioral disturbance: Secondary | ICD-10-CM

## 2019-11-23 NOTE — Patient Instructions (Signed)
See assessment and plan under each diagnosis in the problem list and acutely for this visit 

## 2019-11-23 NOTE — Assessment & Plan Note (Signed)
Patient now exhibiting combative behavior with no orientation to place or person.

## 2019-11-23 NOTE — Progress Notes (Signed)
   NURSING HOME LOCATION:  [facility] ROOM NUMBER: [id number]  CODE STATUS: DNR  PCP: Pascal Lux, MD  This is a nursing facility follow up for specific acute issue of seizure.  Interim medical record and care since last [facility] visit was updated with review of diagnostic studies and change in clinical status since last visit were documented.  HPI: [facility] NP reported to me that the patient had a seizure 11/26 manifested as tonic-clonic activity of extremities with urinary and stool incontinence.  Keppa dose was adjusted from 250 mg twice daily to 500 mg twice daily.  There have been no recurrent seizures reported since that event but the patient has been combative, attempting to strike out at staff.  Also she has been very lethargic with garbled, undiscernible speech content. Patient can provide no meaningful history because of vascular dementia in context of history of CVA. She has a history of seizures.  In March 2020 EEG revealed slowing on the left.  MRI without contrast was performed because of altered mental status and possible seizure.  This study revealed a small acute infarct in the right posterior cerebellum.  Extensive small vessel disease is present.  No epileptogenic focus was seen.  Keppra 250 mg twice daily was initiated. On 09/26/2019 she sustained a frontal hematoma in a fall.  CT scan at that time showed periventricular white matter changes related to microangiopathy.  Also generalized cerebral atrophy was documented. Labs are not current because of the pandemic.  Review of systems: Could not be completed due to her present status.  Physical exam:  Pertinent or positive findings: She appears her stated age and chronically ill and malnourished.  She stares blankly unfocused toward the examiner.  She has alternating exotropia of the eyes with some superior deviation as well.  She appears edentulous but will not allow an intraoral exam.  She exhibits persistent  partial tongue protrusions which causes a tremor of the lower lip.  Heart and breath sounds are profoundly distant.   She exhibits no spontaneous activity of the extremities.  There is atrophy of the extremities especially the lower extremities.  There is also interosseous wasting.  She has a flexion contracture of the fifth finger on the left.  Pulses are not palpable.  She has an antiwandering bracelet at the right ankle.  General appearance: no acute distress, increased work of breathing is present.   Lymphatic: No lymphadenopathy about the head, neck, axilla. Eyes: No conjunctival inflammation or lid edema is present. There is no scleral icterus. Ears:  External ear exam shows no significant lesions or deformities.   Nose:  External nasal examination shows no deformity or inflammation. Nasal mucosa are pink and moist without lesions, exudates Neck:  No thyromegaly, masses, tenderness noted.    Heart:  No gallop, murmur, click, rub .  Lungs:  without wheezes, rhonchi, rales, rubs. Abdomen: Bowel sounds are normal. Abdomen is soft and nontender with no organomegaly, hernias, masses. GU: Deferred  Extremities:  No cyanosis, clubbing, edema  Neurologic exam : Balance, Rhomberg, finger to nose testing could not be completed due to clinical state Skin: Warm & dry w/o tenting. No significant lesions or rash.  See summary under each active problem in the Problem List with associated updated therapeutic plan

## 2019-11-23 NOTE — Assessment & Plan Note (Addendum)
No additional seizure activity reported with increased Keppra dose.

## 2019-11-24 DIAGNOSIS — R627 Adult failure to thrive: Secondary | ICD-10-CM | POA: Insufficient documentation

## 2019-11-24 NOTE — Assessment & Plan Note (Signed)
Clinically pre terminal; Hospice consult will be discussed [name], [facility] NP

## 2019-12-04 IMAGING — MR MRA HEAD WITHOUT CONTRAST
2 series · 16 of 48 positions shown · non-contrast
Comparison: Brain MRI and head CT earlier today.

CLINICAL DATA: [AGE] female with small acute infarct in the
posterior right cerebellum on MRI earlier today.

EXAM:
MRA HEAD WITHOUT CONTRAST
TECHNIQUE: Angiographic images of the Circle of Willis were obtained using MRA
technique without intravenous contrast.

[Series 13: 3d cow · axial · 0.8mm · 0.41mm/px · z∈[-112,-30]mm · 15 of 130 slices shown]
[im 1/130]
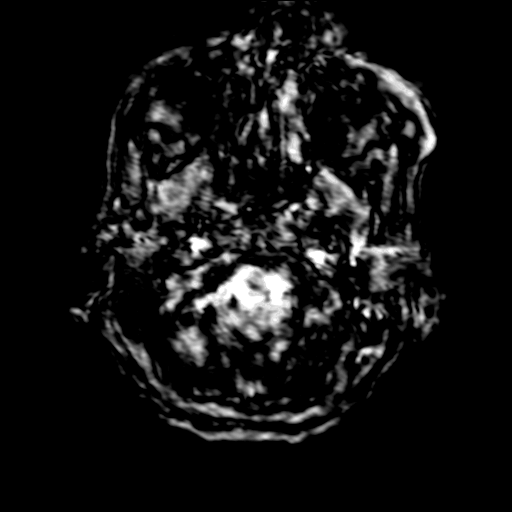
[im 3/130]
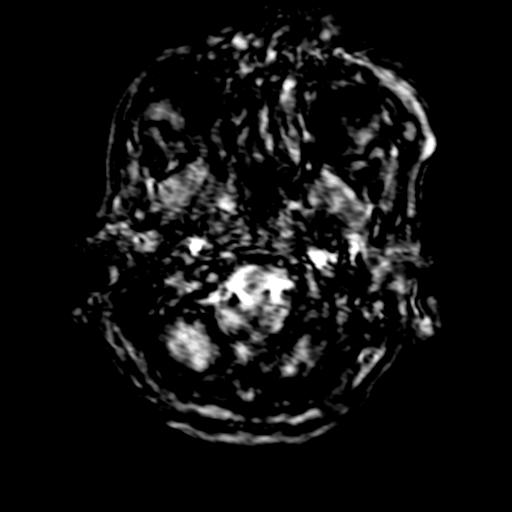
[im 6/130]
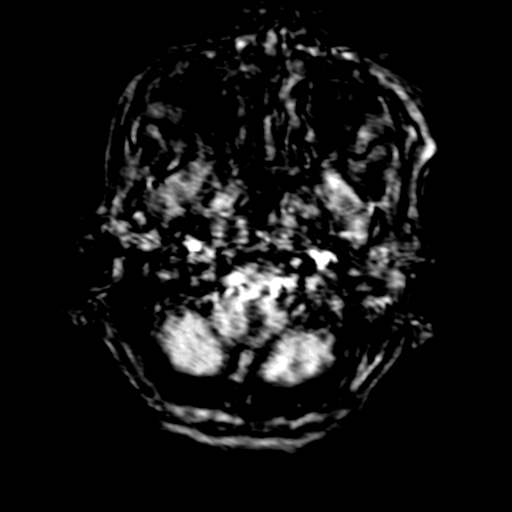
[im 9/130]
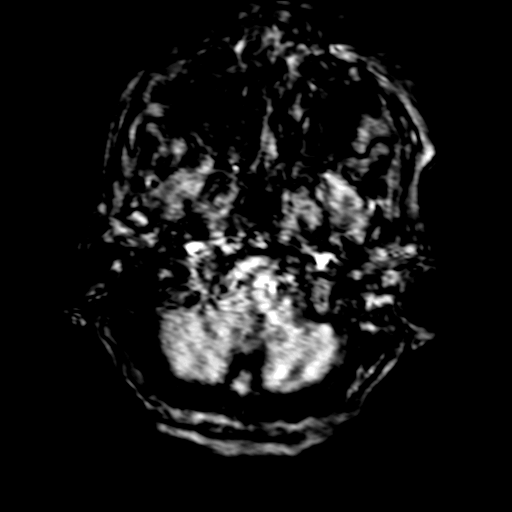
[im 12/130]
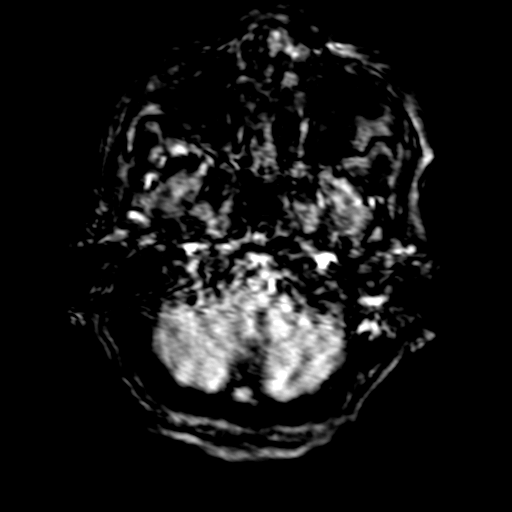
[im 20/130]
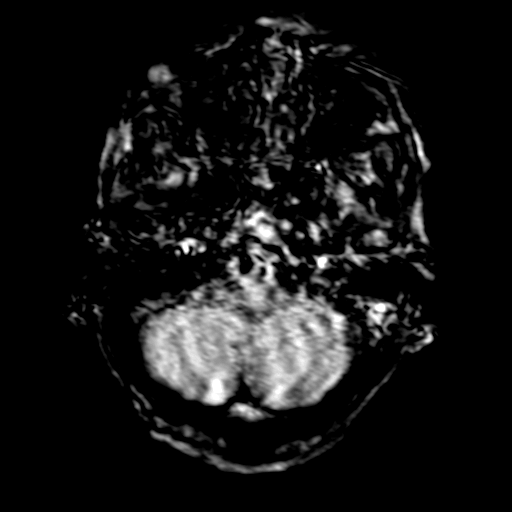
[im 23/130]
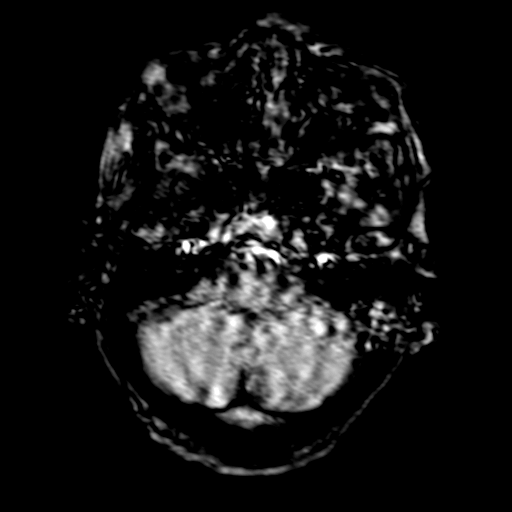
[im 40/130]
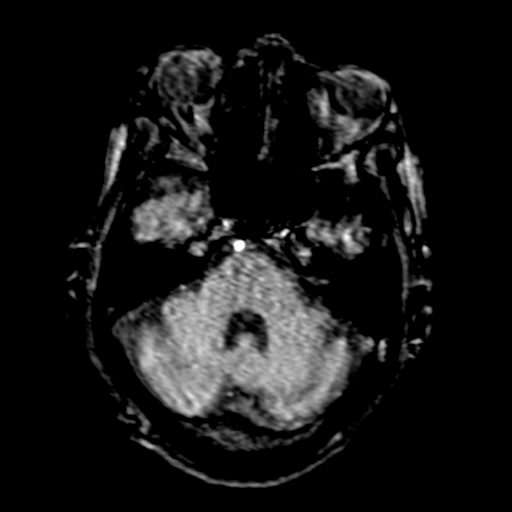
[im 57/130]
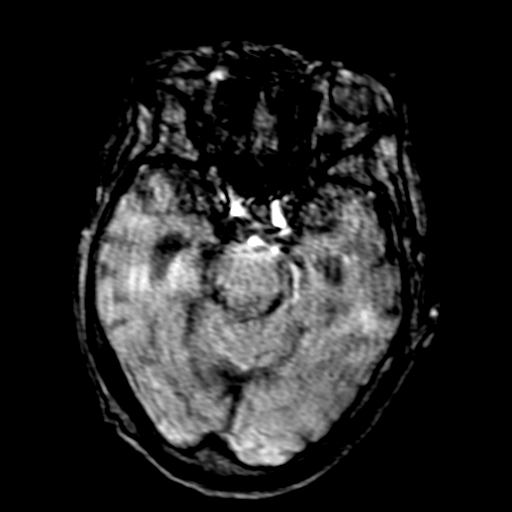
[im 65/130]
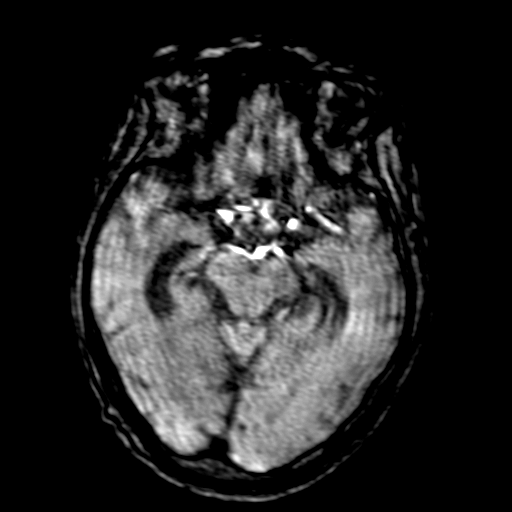
[im 73/130]
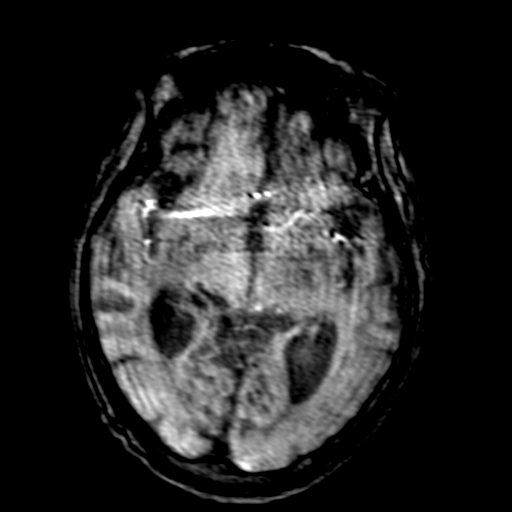
[im 90/130]
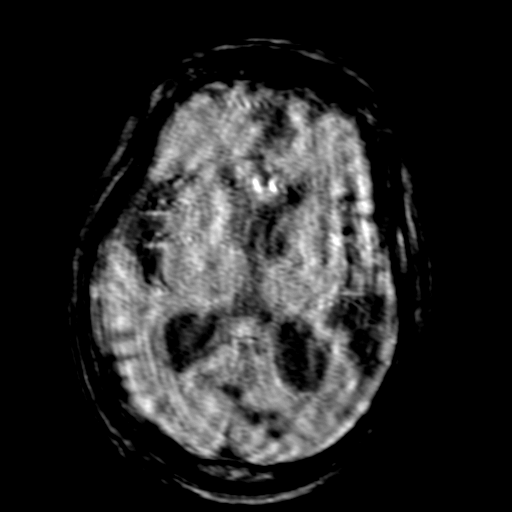
[im 107/130]
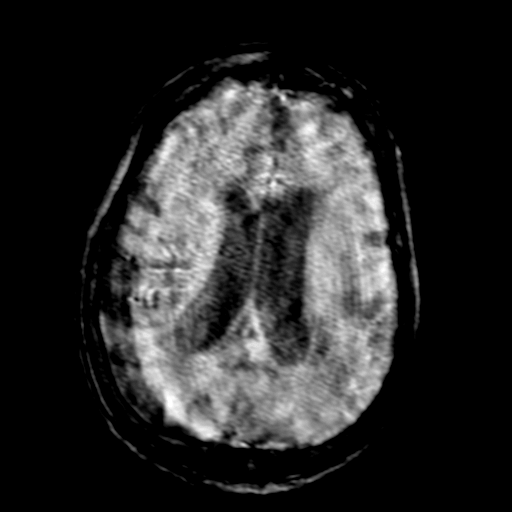
[im 110/130]
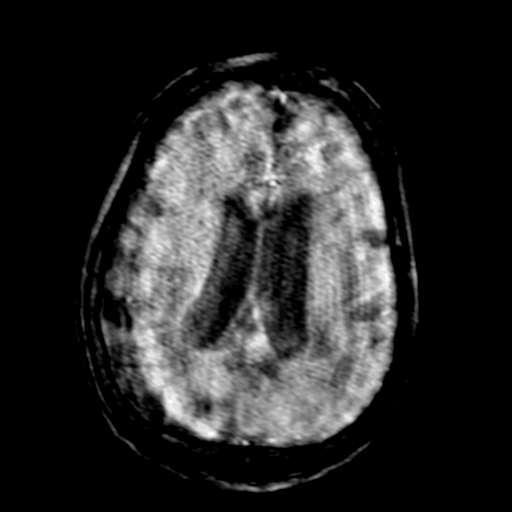
[im 124/130]
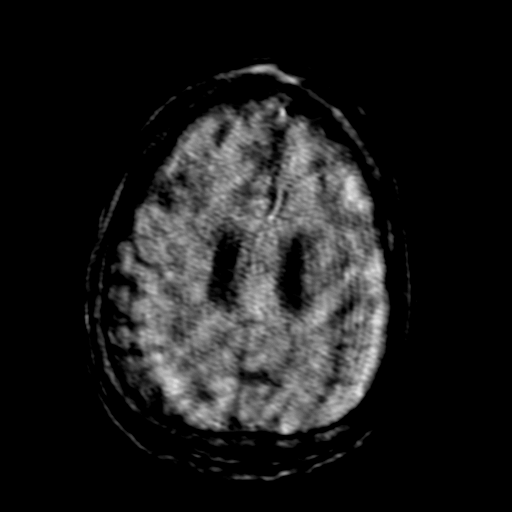

[Series 1068: mip tumble · axial · 0.4mm · 0.21mm/px · 1 of 3 slices shown]
[im 1/3]
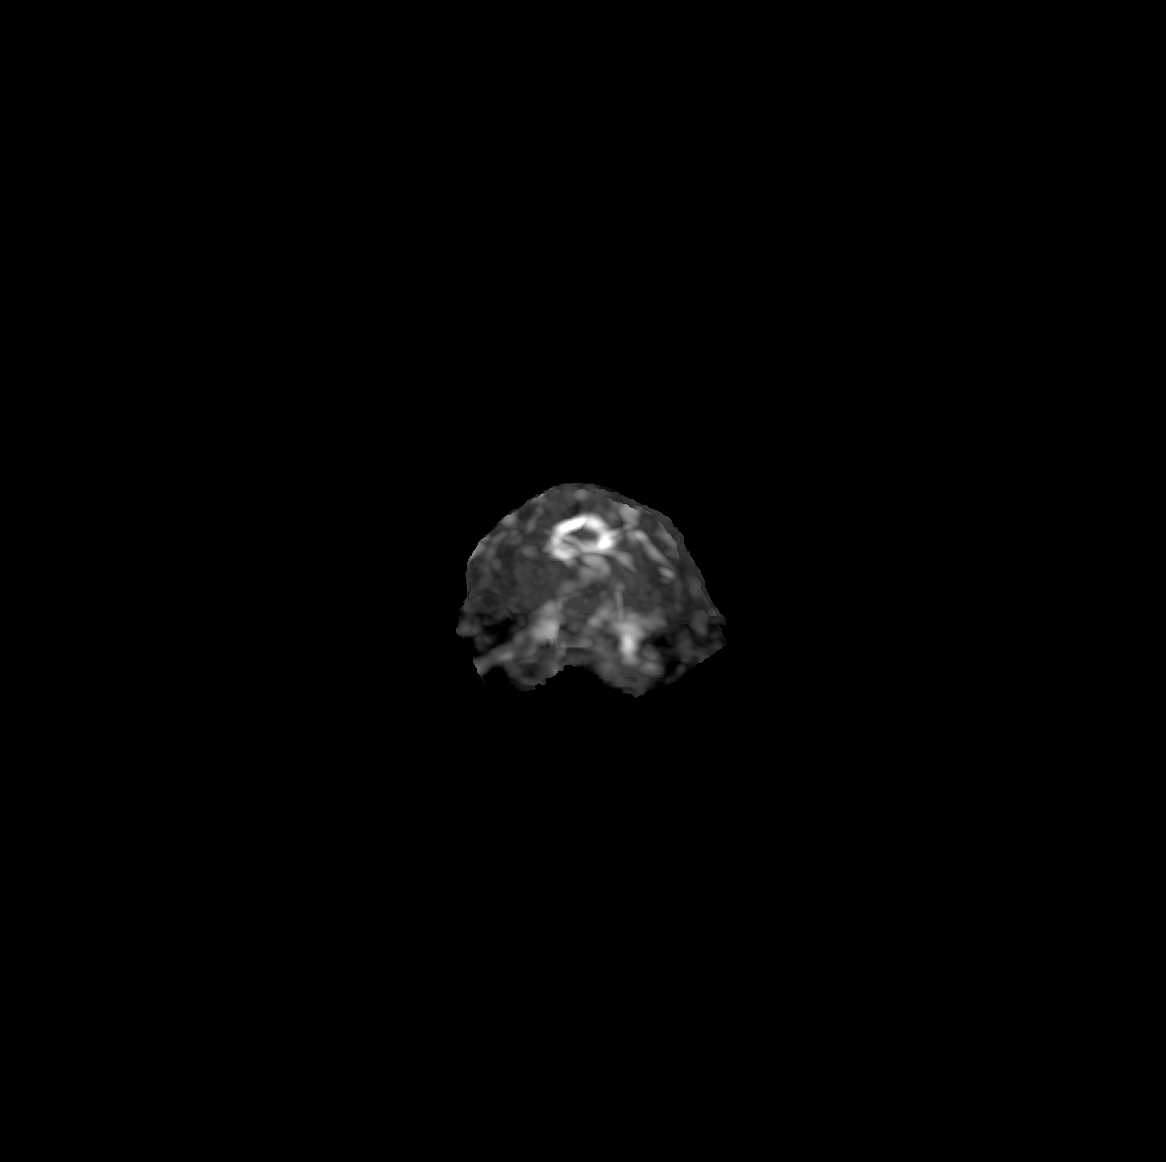

[16 of 48 positions shown; findings below may reference images not displayed]

FINDINGS: Study is intermittently degraded by motion artifact despite repeated
imaging attempts.

Poor detail of the distal right vertebral artery.

The distal left vertebral artery appears patent. The basilar artery
is patent. The basilar tip, SCA and PCA origins appear patent. PCA
branch detail degraded by motion.

Both ICA siphons appear patent. Both carotid termini, MCA and ACA
origins are patent. Both MCA M1 segments appear patent. Poor
bilateral MCA branch detail. ACA A1 segments, anterior communicating
artery common proximal 82 segments appear patent and within normal
limits. Poor distal ACA branch detail.
IMPRESSION: 1. Substantially motion degraded despite repeated imaging attempts.
2. Subsequently, the distal right vertebral artery is not evaluated
here, but appear grossly patent on the earlier MRI.
3. No other intracranial large vessel occlusion.

## 2019-12-24 DEATH — deceased
